# Patient Record
Sex: Female | Born: 1960 | Race: White | Hispanic: No | Marital: Married | Smoking: Current every day smoker
Health system: Southern US, Community
[De-identification: ages and names within clinical notes are randomized; demographics above are authoritative.]

## PROBLEM LIST (undated history)

## (undated) DIAGNOSIS — K439 Ventral hernia without obstruction or gangrene: Secondary | ICD-10-CM

## (undated) DIAGNOSIS — D649 Anemia, unspecified: Secondary | ICD-10-CM

## (undated) DIAGNOSIS — F32A Depression, unspecified: Secondary | ICD-10-CM

## (undated) DIAGNOSIS — R0603 Acute respiratory distress: Secondary | ICD-10-CM

## (undated) DIAGNOSIS — J449 Chronic obstructive pulmonary disease, unspecified: Secondary | ICD-10-CM

## (undated) DIAGNOSIS — J45909 Unspecified asthma, uncomplicated: Secondary | ICD-10-CM

## (undated) DIAGNOSIS — F419 Anxiety disorder, unspecified: Secondary | ICD-10-CM

## (undated) DIAGNOSIS — F329 Major depressive disorder, single episode, unspecified: Secondary | ICD-10-CM

## (undated) HISTORY — DX: Acute respiratory distress: R06.03

## (undated) HISTORY — PX: HERNIA REPAIR: SHX51

## (undated) HISTORY — PX: TUBAL LIGATION: SHX77

## (undated) HISTORY — PX: FRACTURE SURGERY: SHX138

## (undated) HISTORY — PX: COLON SURGERY: SHX602

## (undated) HISTORY — PX: EAR TUBE REMOVAL: SHX1486

---

## 2005-05-26 ENCOUNTER — Emergency Department: Payer: Self-pay | Admitting: Unknown Physician Specialty

## 2007-08-03 ENCOUNTER — Ambulatory Visit: Payer: Self-pay | Admitting: Family Medicine

## 2007-11-10 ENCOUNTER — Inpatient Hospital Stay: Payer: Self-pay | Admitting: Psychiatry

## 2008-03-09 ENCOUNTER — Emergency Department: Payer: Self-pay | Admitting: Emergency Medicine

## 2009-05-23 ENCOUNTER — Ambulatory Visit: Payer: Self-pay | Admitting: Otolaryngology

## 2009-06-02 ENCOUNTER — Ambulatory Visit: Payer: Self-pay | Admitting: Otolaryngology

## 2009-06-13 ENCOUNTER — Emergency Department: Payer: Self-pay | Admitting: Emergency Medicine

## 2009-10-05 ENCOUNTER — Inpatient Hospital Stay (HOSPITAL_COMMUNITY): Admission: EM | Admit: 2009-10-05 | Discharge: 2009-10-12 | Payer: Self-pay | Admitting: Emergency Medicine

## 2009-11-24 ENCOUNTER — Ambulatory Visit: Payer: Self-pay | Admitting: Neurosurgery

## 2010-02-03 ENCOUNTER — Ambulatory Visit: Payer: Self-pay | Admitting: Neurosurgery

## 2010-07-20 LAB — BASIC METABOLIC PANEL
BUN: 6 mg/dL (ref 6–23)
BUN: 7 mg/dL (ref 6–23)
Calcium: 8.1 mg/dL — ABNORMAL LOW (ref 8.4–10.5)
Chloride: 95 mEq/L — ABNORMAL LOW (ref 96–112)
Creatinine, Ser: 0.55 mg/dL (ref 0.4–1.2)
Creatinine, Ser: 0.71 mg/dL (ref 0.4–1.2)
GFR calc non Af Amer: >60 mL/min (ref >60)
Glucose, Bld: 108 mg/dL — ABNORMAL HIGH (ref 70–99)
Glucose, Bld: 72 mg/dL (ref 70–99)
Potassium: 3.7 mEq/L (ref 3.5–5.1)
Sodium: 134 mEq/L — ABNORMAL LOW (ref 135–145)

## 2010-07-20 LAB — CBC
HCT: 28.7 % — ABNORMAL LOW (ref 36.0–46.0)
HCT: 30.7 % — ABNORMAL LOW (ref 36.0–46.0)
HCT: 37.2 % (ref 36.0–46.0)
Hemoglobin: 10.5 g/dL — ABNORMAL LOW (ref 12.0–15.0)
Hemoglobin: 11 g/dL — ABNORMAL LOW (ref 12.0–15.0)
Hemoglobin: 9.9 g/dL — ABNORMAL LOW (ref 12.0–15.0)
MCHC: 35 g/dL (ref 30.0–36.0)
MCHC: 35.3 g/dL (ref 30.0–36.0)
MCV: 114 fL — ABNORMAL HIGH (ref 78.0–100.0)
MCV: 114.3 fL — ABNORMAL HIGH (ref 78.0–100.0)
MCV: 115.2 fL — ABNORMAL HIGH (ref 78.0–100.0)
Platelets: 101 10*3/uL — ABNORMAL LOW (ref 150–400)
Platelets: 52 10*3/uL — ABNORMAL LOW (ref 150–400)
Platelets: 69 10*3/uL — ABNORMAL LOW (ref 150–400)
RBC: 3.27 MIL/uL — ABNORMAL LOW (ref 3.87–5.11)
RDW: 14.4 % (ref 11.5–15.5)
RDW: 14.7 % (ref 11.5–15.5)
RDW: 14.9 % (ref 11.5–15.5)
RDW: 15 % (ref 11.5–15.5)
WBC: 4.6 10*3/uL (ref 4.0–10.5)
WBC: 5.6 10*3/uL (ref 4.0–10.5)
WBC: 5.6 10*3/uL (ref 4.0–10.5)

## 2010-07-20 LAB — PROTIME-INR
Prothrombin Time: 12.9 seconds (ref 11.6–15.2)
Prothrombin Time: 13.1 seconds (ref 11.6–15.2)

## 2010-07-20 LAB — COMPREHENSIVE METABOLIC PANEL
ALT: 163 U/L — ABNORMAL HIGH (ref 0–35)
ALT: 77 U/L — ABNORMAL HIGH (ref 0–35)
AST: 854 U/L — ABNORMAL HIGH (ref 0–37)
Albumin: 3 g/dL — ABNORMAL LOW (ref 3.5–5.2)
Albumin: 3.4 g/dL — ABNORMAL LOW (ref 3.5–5.2)
Alkaline Phosphatase: 99 U/L (ref 39–117)
BUN: 4 mg/dL — ABNORMAL LOW (ref 6–23)
BUN: 6 mg/dL (ref 6–23)
CO2: 29 mEq/L (ref 19–32)
Chloride: 93 mEq/L — ABNORMAL LOW (ref 96–112)
Creatinine, Ser: 0.61 mg/dL (ref 0.4–1.2)
Glucose, Bld: 88 mg/dL (ref 70–99)
Glucose, Bld: 95 mg/dL (ref 70–99)
Potassium: 3.3 mEq/L — ABNORMAL LOW (ref 3.5–5.1)
Sodium: 135 mEq/L (ref 135–145)
Sodium: 137 mEq/L (ref 135–145)
Total Bilirubin: 2.1 mg/dL — ABNORMAL HIGH (ref 0.3–1.2)
Total Protein: 6.1 g/dL (ref 6.0–8.3)
Total Protein: 6.2 g/dL (ref 6.0–8.3)

## 2010-07-20 LAB — ABO/RH: ABO/RH(D): O POS

## 2010-07-20 LAB — MRSA PCR SCREENING: MRSA by PCR: NEGATIVE

## 2010-07-20 LAB — PREPARE PLATELETS

## 2010-07-20 LAB — CK TOTAL AND CKMB (NOT AT ARMC): Relative Index: 2.5 (ref 0.0–2.5)

## 2010-07-20 LAB — TYPE AND SCREEN

## 2011-06-15 IMAGING — CR DG CHEST 1V PORT
1 series · 1 of 1 positions shown · non-contrast
Comparison: CT chest of 10/04/2009

CLINICAL DATA: C2 fracture, rib and sternal fractures

PORTABLE CHEST - 1 VIEW

[AP]
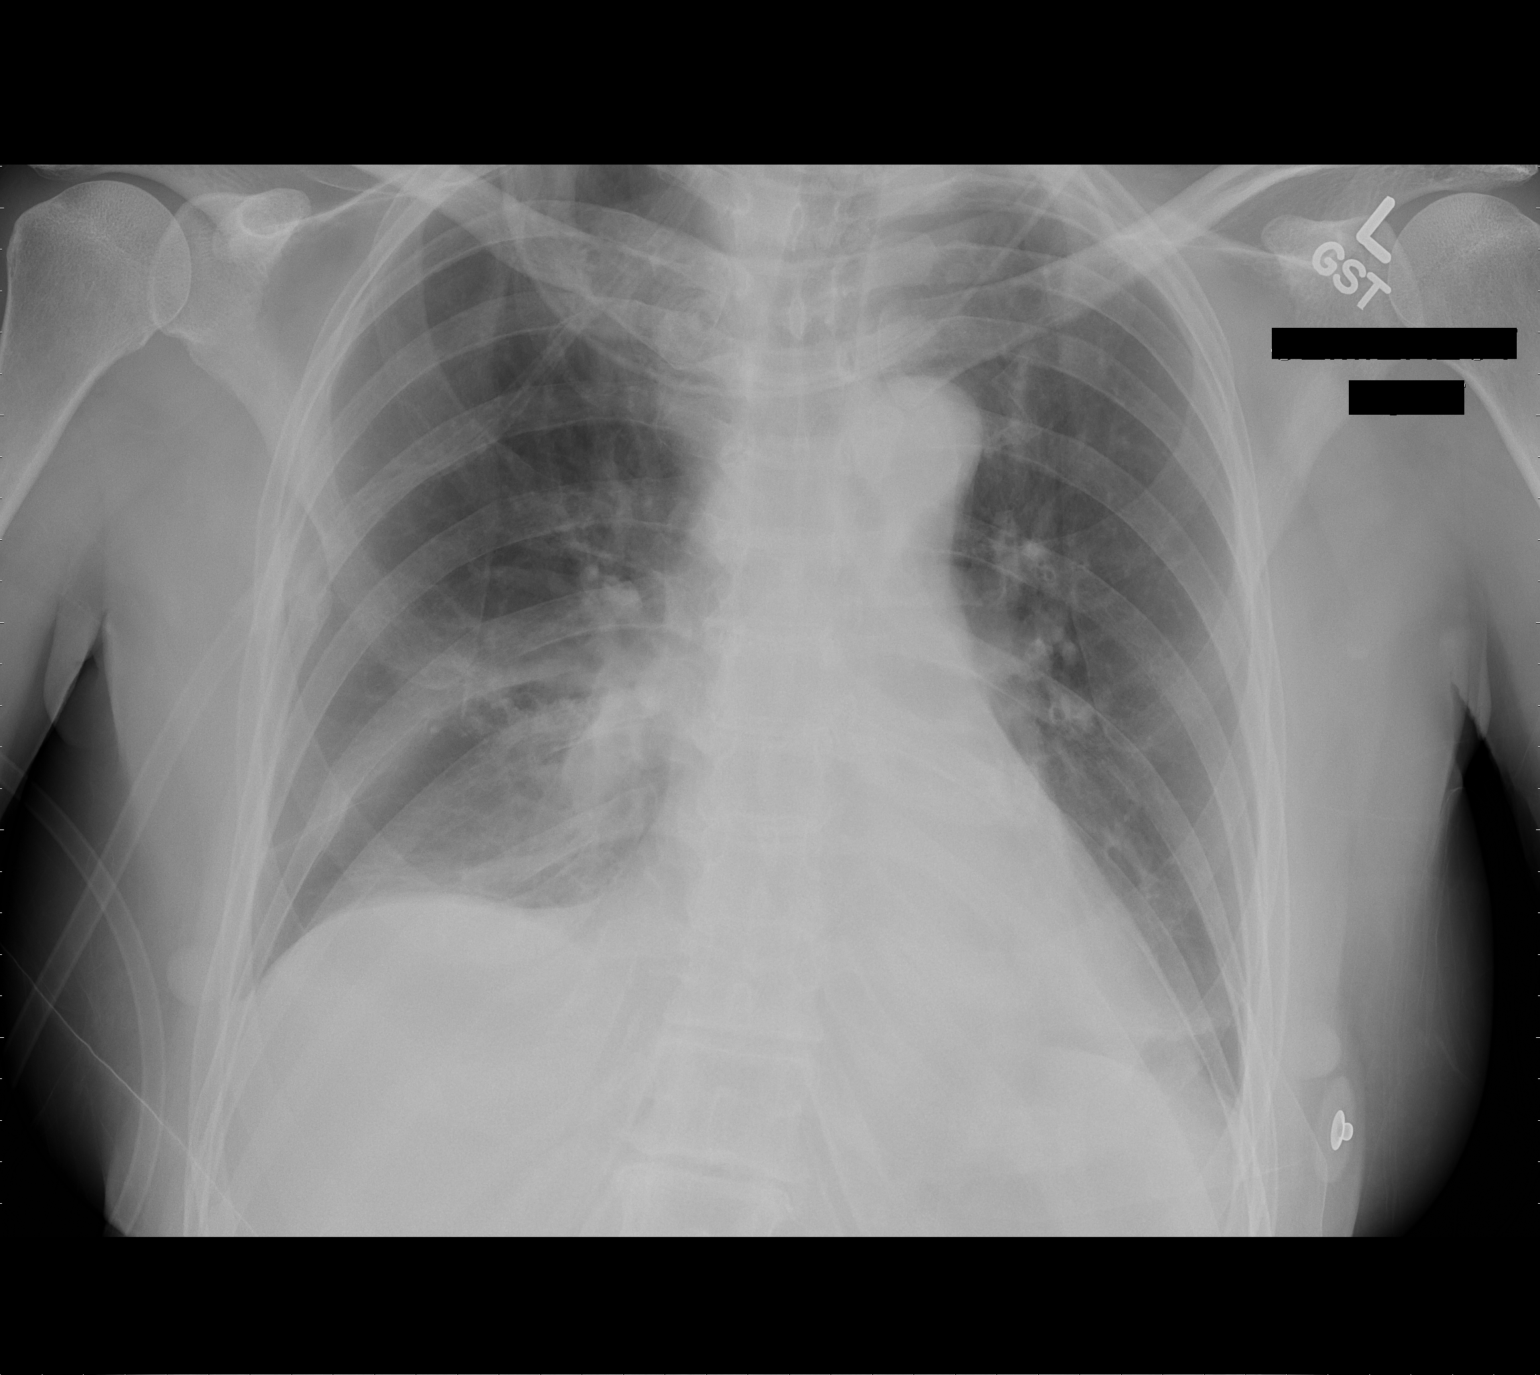

[1 of 1 positions shown; findings below may reference images not displayed]

FINDINGS: There are new opacities at the lung bases compared to the
CT most consistent with contusion and possible atelectasis.  A
small left pneumothorax is difficult to exclude.  Heart size is
stable.  Fracture of the right posterolateral sixth rib is noted.
IMPRESSION: 1.  New opacities at the lung bases most consistent with contusion
and atelectasis.  Pneumonia is difficult to exclude and follow up
is recommended.
2.  Cannot exclude small left pneumothorax.

## 2011-12-30 DIAGNOSIS — G4700 Insomnia, unspecified: Secondary | ICD-10-CM | POA: Insufficient documentation

## 2011-12-30 DIAGNOSIS — F17200 Nicotine dependence, unspecified, uncomplicated: Secondary | ICD-10-CM | POA: Insufficient documentation

## 2011-12-31 ENCOUNTER — Ambulatory Visit: Payer: Self-pay

## 2012-09-25 ENCOUNTER — Inpatient Hospital Stay: Payer: Self-pay | Admitting: Internal Medicine

## 2012-09-25 LAB — CBC
MCV: 85 fL (ref 80–100)
Platelet: 193 10*3/uL (ref 150–440)
RBC: 4.26 10*6/uL (ref 3.80–5.20)
WBC: 10 10*3/uL (ref 3.6–11.0)

## 2012-09-25 LAB — COMPREHENSIVE METABOLIC PANEL
Alkaline Phosphatase: 94 U/L (ref 50–136)
Calcium, Total: 9.5 mg/dL (ref 8.5–10.1)
Chloride: 107 mmol/L (ref 98–107)
Creatinine: 0.57 mg/dL — ABNORMAL LOW (ref 0.60–1.30)
EGFR (African American): >60
Glucose: 127 mg/dL — ABNORMAL HIGH (ref 65–99)
Potassium: 3.7 mmol/L (ref 3.5–5.1)
Sodium: 139 mmol/L (ref 136–145)
Total Protein: 7.8 g/dL (ref 6.4–8.2)

## 2012-09-26 LAB — CBC WITH DIFFERENTIAL/PLATELET
Basophil #: 0 10*3/uL (ref 0.0–0.1)
Eosinophil %: 0 %
HCT: 35.8 % (ref 35.0–47.0)
HGB: 12 g/dL (ref 12.0–16.0)
Lymphocyte #: 1 10*3/uL (ref 1.0–3.6)
MCH: 28.5 pg (ref 26.0–34.0)
Monocyte #: 0.4 x10 3/mm (ref 0.2–0.9)
Monocyte %: 2.6 %
Neutrophil %: 90.4 %
Platelet: 210 10*3/uL (ref 150–440)
RBC: 4.2 10*6/uL (ref 3.80–5.20)

## 2012-09-26 LAB — BASIC METABOLIC PANEL
Anion Gap: 6 — ABNORMAL LOW (ref 7–16)
BUN: 27 mg/dL — ABNORMAL HIGH (ref 7–18)
Calcium, Total: 9.7 mg/dL (ref 8.5–10.1)
Chloride: 103 mmol/L (ref 98–107)
Co2: 26 mmol/L (ref 21–32)
Creatinine: 0.73 mg/dL (ref 0.60–1.30)
EGFR (African American): >60
EGFR (Non-African Amer.): >60
Glucose: 146 mg/dL — ABNORMAL HIGH (ref 65–99)
Sodium: 135 mmol/L — ABNORMAL LOW (ref 136–145)

## 2012-09-27 LAB — CBC WITH DIFFERENTIAL/PLATELET
Basophil #: 0 10*3/uL (ref 0.0–0.1)
Basophil %: 0 %
Eosinophil %: 0 %
Lymphocyte #: 1.1 10*3/uL (ref 1.0–3.6)
Lymphocyte %: 4.2 %
RBC: 4.57 10*6/uL (ref 3.80–5.20)
WBC: 25.6 10*3/uL — ABNORMAL HIGH (ref 3.6–11.0)

## 2012-09-27 LAB — CLOSTRIDIUM DIFFICILE BY PCR

## 2012-09-28 LAB — CBC WITH DIFFERENTIAL/PLATELET
Basophil #: 0 10*3/uL (ref 0.0–0.1)
Eosinophil %: 0 %
HGB: 12 g/dL (ref 12.0–16.0)
Lymphocyte %: 9 %
MCH: 28.8 pg (ref 26.0–34.0)
MCV: 85 fL (ref 80–100)
Monocyte %: 3.2 %
Neutrophil #: 11.7 10*3/uL — ABNORMAL HIGH (ref 1.4–6.5)
Neutrophil %: 87.8 %
Platelet: 220 10*3/uL (ref 150–440)

## 2013-05-28 DIAGNOSIS — K921 Melena: Secondary | ICD-10-CM | POA: Insufficient documentation

## 2013-06-22 DIAGNOSIS — K6389 Other specified diseases of intestine: Secondary | ICD-10-CM | POA: Insufficient documentation

## 2013-10-30 DIAGNOSIS — J449 Chronic obstructive pulmonary disease, unspecified: Secondary | ICD-10-CM | POA: Insufficient documentation

## 2014-03-12 ENCOUNTER — Inpatient Hospital Stay: Payer: Self-pay | Admitting: Internal Medicine

## 2014-03-12 LAB — COMPREHENSIVE METABOLIC PANEL
ANION GAP: 10 (ref 7–16)
AST: 28 U/L (ref 15–37)
Albumin: 3.4 g/dL (ref 3.4–5.0)
Alkaline Phosphatase: 92 U/L
BILIRUBIN TOTAL: 0.2 mg/dL (ref 0.2–1.0)
BUN: 13 mg/dL (ref 7–18)
CALCIUM: 9.1 mg/dL (ref 8.5–10.1)
CO2: 27 mmol/L (ref 21–32)
Chloride: 102 mmol/L (ref 98–107)
Creatinine: 1 mg/dL (ref 0.60–1.30)
EGFR (African American): >60
EGFR (Non-African Amer.): >60
GLUCOSE: 113 mg/dL — AB (ref 65–99)
Osmolality: 278 (ref 275–301)
POTASSIUM: 4.2 mmol/L (ref 3.5–5.1)
SGPT (ALT): 31 U/L
SODIUM: 139 mmol/L (ref 136–145)
TOTAL PROTEIN: 8 g/dL (ref 6.4–8.2)

## 2014-03-12 LAB — CK TOTAL AND CKMB (NOT AT ARMC)
CK, Total: 160 U/L
CK-MB: 1.1 ng/mL (ref 0.5–3.6)

## 2014-03-12 LAB — CBC
HCT: 30.9 % — ABNORMAL LOW (ref 35.0–47.0)
HGB: 9.6 g/dL — AB (ref 12.0–16.0)
MCH: 23.6 pg — AB (ref 26.0–34.0)
MCHC: 31.2 g/dL — ABNORMAL LOW (ref 32.0–36.0)
MCV: 76 fL — ABNORMAL LOW (ref 80–100)
PLATELETS: 208 10*3/uL (ref 150–440)
RBC: 4.09 10*6/uL (ref 3.80–5.20)
RDW: 17.3 % — AB (ref 11.5–14.5)
WBC: 13.2 10*3/uL — ABNORMAL HIGH (ref 3.6–11.0)

## 2014-03-12 LAB — TROPONIN I: Troponin-I: <0.02 ng/mL

## 2014-03-13 LAB — CBC WITH DIFFERENTIAL/PLATELET
BASOS PCT: 0.1 %
Basophil #: 0 10*3/uL (ref 0.0–0.1)
EOS PCT: 0 %
Eosinophil #: 0 10*3/uL (ref 0.0–0.7)
HCT: 28.4 % — AB (ref 35.0–47.0)
HGB: 8.9 g/dL — AB (ref 12.0–16.0)
LYMPHS ABS: 0.9 10*3/uL — AB (ref 1.0–3.6)
LYMPHS PCT: 5.3 %
MCH: 23.6 pg — ABNORMAL LOW (ref 26.0–34.0)
MCHC: 31.2 g/dL — ABNORMAL LOW (ref 32.0–36.0)
MCV: 75 fL — ABNORMAL LOW (ref 80–100)
MONO ABS: 0.3 x10 3/mm (ref 0.2–0.9)
MONOS PCT: 1.6 %
NEUTROS ABS: 15.7 10*3/uL — AB (ref 1.4–6.5)
NEUTROS PCT: 93 %
PLATELETS: 198 10*3/uL (ref 150–440)
RBC: 3.77 10*6/uL — ABNORMAL LOW (ref 3.80–5.20)
RDW: 18.1 % — ABNORMAL HIGH (ref 11.5–14.5)
WBC: 16.8 10*3/uL — ABNORMAL HIGH (ref 3.6–11.0)

## 2014-03-13 LAB — BASIC METABOLIC PANEL
Anion Gap: 9 (ref 7–16)
BUN: 17 mg/dL (ref 7–18)
CHLORIDE: 104 mmol/L (ref 98–107)
CO2: 27 mmol/L (ref 21–32)
Calcium, Total: 9 mg/dL (ref 8.5–10.1)
Creatinine: 1.07 mg/dL (ref 0.60–1.30)
EGFR (African American): >60
GFR CALC NON AF AMER: 57 — AB
Glucose: 252 mg/dL — ABNORMAL HIGH (ref 65–99)
OSMOLALITY: 289 (ref 275–301)
Potassium: 3.6 mmol/L (ref 3.5–5.1)
SODIUM: 140 mmol/L (ref 136–145)

## 2014-03-14 LAB — HEMOGLOBIN A1C: HEMOGLOBIN A1C: 6.7 % — AB (ref 4.2–6.3)

## 2014-03-17 LAB — CULTURE, BLOOD (SINGLE)

## 2014-03-31 ENCOUNTER — Inpatient Hospital Stay: Payer: Self-pay | Admitting: Internal Medicine

## 2014-03-31 LAB — IRON AND TIBC
IRON BIND. CAP.(TOTAL): 327 ug/dL (ref 250–450)
IRON SATURATION: 3 %
IRON: 11 ug/dL — AB (ref 50–170)
UNBOUND IRON-BIND. CAP.: 316 ug/dL

## 2014-03-31 LAB — FERRITIN: Ferritin (ARMC): 16 ng/mL (ref 8–388)

## 2014-03-31 LAB — BASIC METABOLIC PANEL
ANION GAP: 7 (ref 7–16)
BUN: 17 mg/dL (ref 7–18)
CALCIUM: 9 mg/dL (ref 8.5–10.1)
CO2: 28 mmol/L (ref 21–32)
CREATININE: 0.97 mg/dL (ref 0.60–1.30)
Chloride: 104 mmol/L (ref 98–107)
EGFR (Non-African Amer.): >60
GLUCOSE: 169 mg/dL — AB (ref 65–99)
Osmolality: 283 (ref 275–301)
POTASSIUM: 3.3 mmol/L — AB (ref 3.5–5.1)
Sodium: 139 mmol/L (ref 136–145)

## 2014-03-31 LAB — ETHANOL

## 2014-03-31 LAB — CBC
HCT: 19.7 % — AB (ref 35.0–47.0)
HGB: 6.2 g/dL — AB (ref 12.0–16.0)
MCH: 23.3 pg — AB (ref 26.0–34.0)
MCHC: 31.2 g/dL — ABNORMAL LOW (ref 32.0–36.0)
MCV: 75 fL — ABNORMAL LOW (ref 80–100)
Platelet: 242 10*3/uL (ref 150–440)
RBC: 2.64 10*6/uL — AB (ref 3.80–5.20)
RDW: 18.9 % — ABNORMAL HIGH (ref 11.5–14.5)
WBC: 7.9 10*3/uL (ref 3.6–11.0)

## 2014-03-31 LAB — TROPONIN I

## 2014-03-31 LAB — RETICULOCYTES
ABSOLUTE RETIC COUNT: 0.0609 10*6/uL (ref 0.019–0.186)
RETICULOCYTE: 2.61 % (ref 0.4–3.1)

## 2014-03-31 LAB — FOLATE: Folic Acid: 16.4 ng/mL (ref 3.1–100.0)

## 2014-04-01 LAB — CBC WITH DIFFERENTIAL/PLATELET
BASOS PCT: 0.1 %
Basophil #: 0 10*3/uL (ref 0.0–0.1)
Eosinophil #: 0 10*3/uL (ref 0.0–0.7)
Eosinophil %: 0 %
HCT: 27.3 % — ABNORMAL LOW (ref 35.0–47.0)
HGB: 8.7 g/dL — ABNORMAL LOW (ref 12.0–16.0)
LYMPHS ABS: 0.9 10*3/uL — AB (ref 1.0–3.6)
LYMPHS PCT: 8 %
MCH: 24.6 pg — ABNORMAL LOW (ref 26.0–34.0)
MCHC: 31.9 g/dL — ABNORMAL LOW (ref 32.0–36.0)
MCV: 77 fL — ABNORMAL LOW (ref 80–100)
MONOS PCT: 2 %
Monocyte #: 0.2 x10 3/mm (ref 0.2–0.9)
NEUTROS ABS: 10.5 10*3/uL — AB (ref 1.4–6.5)
NEUTROS PCT: 89.9 %
PLATELETS: 248 10*3/uL (ref 150–440)
RBC: 3.54 10*6/uL — AB (ref 3.80–5.20)
RDW: 20.4 % — AB (ref 11.5–14.5)
WBC: 11.7 10*3/uL — AB (ref 3.6–11.0)

## 2014-04-02 LAB — CBC WITH DIFFERENTIAL/PLATELET
BASOS ABS: 0 10*3/uL (ref 0.0–0.1)
Basophil %: 0.1 %
EOS PCT: 0 %
Eosinophil #: 0 10*3/uL (ref 0.0–0.7)
HCT: 26.7 % — ABNORMAL LOW (ref 35.0–47.0)
HGB: 8.3 g/dL — AB (ref 12.0–16.0)
LYMPHS PCT: 7.6 %
Lymphocyte #: 0.9 10*3/uL — ABNORMAL LOW (ref 1.0–3.6)
MCH: 24.3 pg — AB (ref 26.0–34.0)
MCHC: 30.9 g/dL — ABNORMAL LOW (ref 32.0–36.0)
MCV: 79 fL — AB (ref 80–100)
Monocyte #: 0.4 x10 3/mm (ref 0.2–0.9)
Monocyte %: 3.1 %
Neutrophil #: 11 10*3/uL — ABNORMAL HIGH (ref 1.4–6.5)
Neutrophil %: 89.2 %
Platelet: 243 10*3/uL (ref 150–440)
RBC: 3.4 10*6/uL — AB (ref 3.80–5.20)
RDW: 21.4 % — AB (ref 11.5–14.5)
WBC: 12.4 10*3/uL — ABNORMAL HIGH (ref 3.6–11.0)

## 2014-08-23 NOTE — H&P (Signed)
PATIENT NAME:  Julia [name], Julia [name] MR#:  [id number] DATE OF BIRTH:  08/27/1960  DATE OF ADMISSION:  09/25/2012  PRIMARY CARE PHYSICIAN: Dr. Tobin.  CHIEF COMPLAINT:  Shortness of breath, cough.   HISTORY OF PRESENT ILLNESS: This is a 54-year-old female with significant past medical history of asthma, tobacco abuse, who presents with complaints of shortness of breath, productive sputum, white in color, as well as upper respiratory symptoms including sore throat and runny nose. The patient reports she has been having worsening shortness of breath over the last couple of days. She has been using her inhaler without much relief, so she called EMS who gave her DuoNebs x 3 in the ED. The patient was hypoxic in the ED with significant wheezing, where she was reported to be desaturating into the 60s and the 70s when the paramedics prevented. As well, the patient was tachycardic and hypoxic in the ED, where she required to be put on BiPAP with much improvement of her symptoms. She was complaining of fever at home, but she was afebrile here, did not have any leukocytosis. Her chest x-ray did not show any opacity or infiltrate, but it did show hyperinflated lungs typical for COPD, even though the patient denies any history of COPD in the past. The patient reports she has history of asthma, but  never hospitalized and never intubated for asthma.  She reports only 1 visit to the ED before 3 years for shortness of breath, and it was unclear if this one was due to COPD or asthma exacerbation. The patient was given IV Solu-Medrol treatment in the ED and magnesium sulfate, which improved her symptoms, but she still had decreased air entry with significant wheezing, so hospitalist service was requested to admit the patient for further management and treatment of her acute respiratory failure.   PAST MEDICAL HISTORY: 1.  Asthma.  2.  Tobacco abuse.   ALLERGIES: ATROVENT, BANANA AND POLLENS.   HOME MEDICATIONS: 1.   Lorazepam 0.5 mg 2 times a day as needed.  2.  Sertraline.  3.  Advair Diskus 250/50, 1 puff b.i.d.  4.  Proventil as needed.   FAMILY HISTORY: Significant for sarcoidosis in her mother. Father died at the age 49 of unknown causes.   SOCIAL HISTORY: The patient used to smoke 1 pack per day, currently cut back to 1/2 pack per day. Denies any alcohol or illicit drug use.   REVIEW OF SYSTEMS:  Complains of fever, fatigue, weakness. Denies weight gain, weight loss.   EYES: Denies blurry vision, double vision, pain, inflammation.  EARS, NOSE, THROAT: Denies tinnitus, ear pain, hearing loss, epistaxis. Has some sore throat  and nasal discharge.  RESPIRATORY: Denies any hemoptysis. Complains of dyspnea, cough, productive sputum and wheezing.  CARDIOVASCULAR: Denies chest pain, edema, arrhythmia, palpitations or syncope.  GASTROINTESTINAL: Denies nausea, vomiting, diarrhea, abdominal pain, hematemesis, rectal bleed or jaundice.  GENITOURINARY: Denies dysuria, hematuria or renal colic.  ENDOCRINE: Denies polyuria, polydipsia, heat or cold intolerance.  HEMATOLOGY: Denies anemia, easy bruising or bleeding diathesis.  INTEGUMENTARY: Denies acne, rash or skin lesions.  MUSCULOSKELETAL: Denies any swelling, gout, redness or limited activity.  NEUROLOGIC: Denies CVA, TIA, seizures, ataxia or vertigo.  PSYCHIATRIC: Has history of anxiety. Denies any schizophrenia, substance or alcohol abuse.   PHYSICAL EXAMINATION: VITAL SIGNS: Temperature 97.6, pulse 80, respiratory 22, blood pressure 107/61, saturating 98% on BiPAP.  GENERAL: Caucasian female, looks comfortable in bed on BiPAP.  HEENT: Head atraumatic, normocephalic. Pupils equal, reactive to light. Pink   conjunctivae. Anicteric sclerae. Moist oral mucosa. Wearing BiPAP.  NECK: Supple. No thyromegaly. No JVD. Decreased air entry bilaterally with diffuse wheezing. No rales, rhonchi.  CARDIOVASCULAR: S1, S2 heard. No rubs, murmurs or gallops.  ABDOMEN:  Soft, nontender, nondistended. Bowel sounds present.  EXTREMITIES: No edema. No clubbing. No cyanosis. Dorsalis pedis pulse +2 bilaterally.  SKIN: Normal skin turgor. Warm and dry.  PSYCHIATRIC: Appropriate affect. Awake, alert x 3. Intact judgment and insight.  NEUROLOGIC: Cranial nerves grossly intact. Motor is 5/5. No focal deficits on motor or sensory.   PERTINENT LABORATORY DATA:  Glucose 127, BUN 12, creatinine 0.57, sodium 139, potassium 3.7, chloride 107, CO2 of 25. ALT 34, AST 34, alk phos phosphatase 94. Troponin less than 0.02. White blood cells 10, hemoglobin 12.3, hematocrit 36, platelets 193. PCO2 of 48 pH 7.34.   RADIOGRAPHIC DATA:  Chest x-ray showing hyperinflated lungs but no persistent infiltrate. Awaiting official reading by radiology.   ASSESSMENT AND PLAN: 1.  Acute respiratory failure: The patient presents with hypoxic respiratory failure, saturating initially in the 60s or 70s by EMS. Despite multiple nebulizer treatments, the patient is still requiring oxygen. This is most likely related to either chronic obstructive pulmonary disease versus asthma exacerbation.  2.  Chronic obstructive pulmonary disease exacerbation: The patient denies any history of COPD, but has diffuse wheezing, has been smoking for so many years, as well she is on Advair, she was on Singulair, so she will be treated for COPD exacerbation. Currently, she improved on BiPAP. We will have her on BiPAP as needed. We will check sputum culture. We will start her on Levaquin, given the fact she has productive sputum.  We will have her on IV Solu-Medrol and on albuterol as needed. We will resume her on home Advair.  3.  Asthma: Unclear if the patient does have asthma exacerbation at this point. Her symptoms most likely are related to COPD exacerbation, but anyhow she is on Advair for COPD exacerbation and p.r.n. albuterol. SHE HAS ALLERGY TO ATROVENT.  4.  Tobacco abuse: The patient was counseled at length. Does  not seem interested to quit smoking, but currently agreeing to continue with NicoDerm while she is inpatient.  5.  Deep vein thrombosis prophylaxis: SubQ heparin.  6.  Gastrointestinal prophylaxis: On Protonix.   CODE STATUS: Full code.   TOTAL TIME SPENT ON ADMISSION AND PATIENT CARE: 55 minutes.    ____________________________ Dawood [name]. Elgergawy, MD dse:dmm D: 09/25/2012 09:18:31 ET T: 09/25/2012 09:32:56 ET JOB#: [id number]  cc: Dawood [name]. Elgergawy, MD, <Dictator> DAWOOD [name] ELGERGAWY MD ELECTRONICALLY SIGNED 09/27/2012 2:21 

## 2014-08-23 NOTE — Discharge Summary (Signed)
PATIENT NAME:  Julia [name], Julia S MR#:  [id number] DATE OF BIRTH:  08/14/1960  DATE OF ADMISSION:  09/25/2012 DATE OF DISCHARGE:    PRIMARY CARE PHYSICIAN:  Dr. Delton PrairiePaul Tobin.   DISCHARGE DIAGNOSES: 1.  Acute chronic obstructive pulmonary disease/asthma exacerbation.  2.  Acute respiratory failure.  3.  Leukocytosis secondary to steroids.  4.  Vomiting and diarrhea, likely from antibiotic irritation.  5.  Tobacco abuse.   IMAGING STUDIES DONE:  Include a chest x-ray which showed no acute abnormalities.   ADMITTING HISTORY, PHYSICAL AND HOSPITAL COURSE:  Please see detailed H and P dictated by Dr. Randol KernElgergawy on 09/25/2012.  In brief, a 54 year old female patient with prior history of asthma, chronic obstructive pulmonary disease, tobacco abuse, presented to the hospital complaining of productive sputum, shortness of breath.  The patient was wheezing, needing 4 liters of oxygen, was admitted to the hospitalist service for further work-up treatment.   HOSPITAL COURSE:  Acute chronic obstructive pulmonary disease exacerbation with acute respiratory failure.  The patient was started on scheduled nebulizers along with intravenous steroids and antibiotics with which she improved well.  The patient did have leukocytosis secondary to the steroids which is trending down by the time of discharge.  Chest x-ray showed no acute infiltrate.  The patient was counseled for greater than three minutes to quit smoking, which was the probable cause for her exacerbation.  Did not have any fever on the day of discharge.  The patient is saturating 94% on room air.  No wheezing on exam and was discharged home in fair condition.   She did have a brief episode of vomiting and diarrhea which have resolved.  C. difficile was negative.   DISCHARGE MEDICATIONS: 1.  Lorazepam 0.5 mg oral 2 times a day as needed.  2.  Sertraline 100 mg oral once a day.  3.  Proventil 2 puffs inhaled 4 times a day as needed.  4.  Advair Diskus 1  puff inhaled 2 times a day.  5.  Levaquin 500 mg oral once a day for 4 days.  6.  Prednisone 10 mg tablet, 60 mg tapered over 6 days.   DISCHARGE INSTRUCTIONS:  Follow up with primary care physician in a week.  Regular diet.  Activity as tolerated.  Quit smoking.   Time spent on day of discharge in discharge activity was 40 minutes.    ____________________________ Molinda BailiffSrikar R. Felice Deem, MD srs:ea D: 09/29/2012 16:28:04 ET T: 09/30/2012 06:00:50 ET JOB#: [id number]  cc: Wardell HeathSrikar R. Matteson Blue, MD, <Dictator> Orie FishermanSRIKAR R Yoshua Geisinger MD ELECTRONICALLY SIGNED 10/24/2012 10:40

## 2014-08-24 NOTE — H&P (Signed)
PATIENT NAME:  Julia [name], Julia [name] MR#:  [id number] DATE OF BIRTH:  20-Apr-1961  DATE OF ADMISSION:  03/12/2014  PRIMARY CARE PHYSICIAN: Dr. Delton PrairiePaul Tobin.   CHIEF COMPLAINT: Shortness of breath.   HISTORY OF PRESENT ILLNESS: This is a 54 year old female who presents to the hospital complaining of shortness of breath progressively getting worse over the past few days to a week. Her shortness of breath has gotten so worse that she is now short of breath even on minimal exertion or even at rest. She admits to a cough which is productive with clear sputum, admits to some chills but no documented fever. She admits to some nausea but no documented vomiting. She has been waking up in the middle of the night also feeling short of breath and since it has gotten worse she came to the ER for further evaluation. In the Emergency Room the patient was noted to be in acute hypoxic respiratory failure and chest x-ray findings are suggestive of a right lower lobe pneumonia. She was diagnosed with COPD exacerbation likely secondary to pneumonia and hospitalist services were contacted for further treatment and evaluation.   REVIEW OF SYSTEMS:  CONSTITUTIONAL: No documented fever. No weight gain, no weight loss.  EYES: No blurred or double vision.  EARS, NOSE, AND THROAT: No tinnitus. No postnasal drip. No redness of the oropharynx.  RESPIRATORY: Positive cough. Positive wheeze. No hemoptysis. Positive dyspnea. Positive COPD.   CARDIOVASCULAR: No chest pain. No orthopnea. No palpitations. No syncope.  GASTROINTESTINAL: Positive nausea. No vomiting. No diarrhea. No abdominal pain. No melena or hematochezia.  GENITOURINARY: No dysuria or hematuria.  ENDOCRINE: No polyuria, nocturia, heat or cold intolerance.  HEMATOLOGIC: No anemia, no bruising, no bleeding.  INTEGUMENTARY: No rashes, no lesions.  MUSCULOSKELETAL: No arthritis. No swelling. No gout.  NEUROLOGIC: No numbness or tingling. No ataxia. No seizure-type activity.   PSYCHIATRIC: No anxiety. No insomnia. No ADD.   PAST MEDICAL HISTORY: Consistent with asthma, anxiety/depression, history of COPD with ongoing tobacco abuse.   ALLERGIES: ATROVENT, BANANAS, AND POLLEN.   SOCIAL HISTORY: Still smokes about 10 cigarettes per day, has been smoking now for 40 + years. No alcohol abuse. No illicit drug abuse. Lives with her husband.   FAMILY HISTORY:  Father died from complications of alcohol abuse. Mother is alive, she has sarcoidosis.   CURRENT MEDICATIONS:  Xanax 0.5 mg extended release t.i.d. as needed, amitriptyline 25 mg at bedtime, melatonin 5 mg 2 tabs at bedtime, albuterol inhaler 2 puffs 4 times daily as needed, Qvar 40 mcg 2 puffs b.i.d., saline nasal spray 1 spray to each nostril twice daily, Zoloft 50 mg daily, and Tylenol 1000 mg q. 6 hours as needed.   PHYSICAL EXAMINATION:  VITAL SIGNS:  Temperature 98.4, pulse 113, respirations 20, blood pressure 115/85, saturation is 92% on 2 liters nasal cannula.  GENERAL: She is a pleasant-appearing female in mild respiratory distress.  HEAD, EYES, EARS, NOSE, THROAT: Atraumatic, normocephalic. Extraocular muscles are intact. Pupils equal and reactive to light. Sclerae anicteric. No conjunctival injection. No pharyngeal erythema.  NECK: Supple. There is no jugular venous distention. No bruits. No lymphadenopathy. No thyromegaly.  HEART: Regular rate and rhythm, tachycardic. No murmurs, no rubs, no clicks.  LUNGS: She has coarse rhonchi and wheezing diffusely. Positive use of accessory muscles. No dullness to percussion.  ABDOMEN: Soft, flat, nontender, nondistended. Has good bowel sounds. No hepatosplenomegaly appreciated.  EXTREMITIES: No evidence of any cyanosis, clubbing, or peripheral edema. Has + 2 pedal and radial  pulses bilaterally.  NEUROLOGICAL: The patient is alert, awake, and oriented x 3 with no focal motor or sensory deficits appreciated bilaterally.  SKIN: Moist and warm with no rashes  appreciated.  LYMPHATIC: There is no cervical or axillary lymphadenopathy.   LABORATORY DATA: Serum glucose of 113, BUN 13, creatinine 1.0, sodium 139, potassium 4.2, chloride 102, bicarbonate 27. The patient'[name] LFTs are within normal limits. Troponin less than 0.02. White cell count of 13.2, hemoglobin 9.6, hematocrit 30.9, platelet count 208,000. The patient did have a chest x-ray done which showed a right lower lobe pneumonia.   ASSESSMENT AND PLAN: This is a 54 year old female with a history of asthma, anxiety/depression, history of chronic obstructive pulmonary disease with ongoing tobacco abuse, who presents to the hospital due to shortness of breath and noted to be in chronic obstructive pulmonary disease exacerbation due to pneumonia.   1.  Chronic obstructive pulmonary disease exacerbation. This is likely the cause of the patient'[name] acute shortness of breath and hypoxic respiratory failure. The likely cause of the chronic obstructive pulmonary disease exacerbation is underlying right lower lobe pneumonia. I will start the patient on IV steroids, around-the-clock nebulizer treatments, start her on some Advair and Spiriva, give her IV Levaquin for her pneumonia. Follow her clinically. She needs to be assessed for home oxygen prior to discharge.  2.  Pneumonia. This is likely community-acquired pneumonia. I will treat the patient with IV Levaquin, follow blood and sputum cultures.  3.  History of anxiety. Continue with Xanax and Ativan.  4.  Depression. Continue with her Zoloft and Elavil.   CODE STATUS: The patient is a full code.   TIME SPENT ON ADMISSION:  50 minutes.    ____________________________ Rolly Pancake. Cherlynn Kaiser, MD vjs:bu D: 03/12/2014 19:54:28 ET T: 03/12/2014 20:14:00 ET JOB#: [id number]  cc: Rolly Pancake. Cherlynn Kaiser, MD, <Dictator> Houston Siren MD ELECTRONICALLY SIGNED 03/22/2014 12:40

## 2014-08-24 NOTE — Discharge Summary (Signed)
PATIENT NAME:  Julia [name], Brannon S MR#:  [id number] DATE OF BIRTH:  1960-12-02  DATE OF ADMISSION:  03/12/2014 DATE OF DISCHARGE:  03/14/2014  PRESENTING COMPLAINT: Shortness of breath and cough.   DISCHARGE DIAGNOSES:  1.  Acute chronic obstructive pulmonary disease exacerbation.  2.  Pneumonia.  3.  Ongoing tobacco abuse.  4.  Hyperglycemia with borderline type 2 diabetes, hemoglobin A1c is 6.7. Further management per primary care physician as outpatient.   CODE STATUS: Full code.    VITAL SIGNS:  Saturations are 86% with exertion on room air, 92% with 2 liters. The patient will be set up with home oxygen 2 liters. Other vitals at discharge, heart rate is 84, blood pressure 134/79.   FOLLOWUP:  With Dr. Mariana Kaufmanobin, internal medicine primary care physician in 1-2 weeks.   OXYGEN: 2 liters nasal cannula continuous.   DISCHARGE MEDICATIONS:   1. Proventil HFA 2 puffs 4 times a day as needed.   2. Sertraline 50 mg p.o. daily.  3. Tylenol 500 two tablets every 6 hours as needed.  4. Amitriptyline 25 mg at bedtime.  5. Alprazolam 0.5 mg 1 tablet 3 times a day as needed.  6. Saline nasal mist 1 spray each nostril b.i.d.  7. Qvar 2 puffs b.i.d.  8. Melatonin 5 mg 2 tablets at bedtime.  9. Prednisone taper.  10. Levaquin 750 p.o. daily.  11. Advair 250/50 one puff b.i.d.  12. Spiriva 18 mcg inhalation.  13. Albuterol 90 mcg/inhalation 4 times a day as needed.   Follow up with Dr. Mariana Kaufmanobin in 1-2 weeks.   No smoking.   LABORATORY DATA: White count at discharge 16.8, H and H 8.9 and 28.4. Glucose is 252, creatinine is 1.07, sodium is 140.  A1c is 6.7.  Chest x-ray shows right lower lobe pneumonia. Blood cultures negative in 18-24 hours.   BRIEF SUMMARY OF HOSPITAL COURSE: Miss [name] is a pleasant 54 year old Caucasian female with history of asthma, anxiety, depression, and COPD with ongoing tobacco abuse, comes in with shortness of breath and was admitted with:   1.  Acute on chronic COPD  exacerbation most likely due to the patient having pneumonia as well. She was started on IV steroids, inhalers, nebulizers and was assessed for home oxygen for which she has qualified and is arranged to go home with 2 liters nasal cannula oxygen.  2.  Pneumonia right lower lobe.  Will finish her 7 day course of Levaquin. Blood cultures negative.  3.  History of anxiety. Continue Xanax and Ativan.  4.  Depression. Continue Zoloft and Elavil.   5.  Smoking cessation was discussed with the patient. She will follow up with her primary care physician as outpatient.   TIME SPENT: 40 minutes.     ____________________________ Wylie HailSona A. Allena KatzPatel, MD sap:bu D: 03/14/2014 13:51:51 ET T: 03/14/2014 16:45:03 ET JOB#: [id number]  cc: Careen Mauch A. Allena KatzPatel, MD, <Dictator> Vic RipperPaul C. Mariana Kaufmanobin, MD Willow OraSONA A Lauralie Blacksher MD ELECTRONICALLY SIGNED 04/04/2014 14:03

## 2014-08-24 NOTE — Consult Note (Signed)
Brief Consult Note: Diagnosis: melena, anemia.   Patient was seen by consultant.   Comments: Julia [name] is a pleasant 54 y/o caucasian female with couple days of melena with anemia.  She is hemoccult positive.  Suspect UGI bleeding source.  Cannot r/o small bowel AVMs.  [facility] but hx inconsistent with dates as to when this was done (pt tells me 2011, but told previous MD within past 4 months) so we have requested records.  Pt had liquid breakfast at 8am.  EGD with Dr Servando SnareWohl to look for source & possibly control bleeding if needed.  Discussed risks/benefits of procedure which include but are not limited to bleeding, infection, perforation & drug reaction.  Patient agrees with this plan & consent will be obtained.   Plan: 1) NPO 2) EGD this afternoon 3) agree with PPI Thanks for allowing us to participate in her care.  Please see full dictated note. #[id number].  Electronic Signatures: Joselyn ArrowJones, Mykala Mccready L (NP)  (Signed [phone]-Nov-15 14:28)  Authored: Brief Consult Note   Last Updated: 30-Nov-15 14:28 by Joselyn ArrowJones, Kanon Novosel L (NP)

## 2014-08-24 NOTE — H&P (Signed)
PATIENT NAME:  Julia [name], Julia [name] MR#:  [id number] DATE OF BIRTH:  [birth year]  DATE OF ADMISSION:  03/31/2014  REFERRING PHYSICIAN:  Dell Sink. Dolores Frame, M.D.   PRIMARY CARE PHYSICIAN: Vic Ripper. Mariana Kaufman, M.D.   ADMITTING PHYSICIAN: Crissie Figures, M.D.   CHIEF COMPLAINT: Shortness of breath, cough with productive sputum for 2 days.   HISTORY OF PRESENT ILLNESS: A 54 year old, Caucasian female, with a past medical history significant for COPD, history of continuous tobacco use and anxiety/depression, who presents to the Emergency Room with the complaints of shortness of breath associated with cough with productive sputum for the past 2 days. The patient was recently admitted to [facility] on November 10 to November 12 for COPD exacerbation, and following treatment, she was discharged to home. She continues to smoke, and so now again she presents complaining of shortness of breath, cough and productive sputum. No fever, no chest pain, no nausea, no vomiting or diarrhea. In the Emergency Room, the patient was evaluated by the ED physician and was found to have respiratory distress with bilateral rhonchi and was found to be with COPD exacerbation, and work-up revealed acute anemia with acute drop in hemoglobin and hematocrit, with hemoglobin of 6.2 and hematocrit of 19.7, which was kind of low compared to her recent admission 2 weeks ago. Hence, the hospitalist service was consulted for further evaluation and management of COPD exacerbation and acute anemia secondary to GI blood loss requiring PRBC transfusion. On questioning, the patient admits she has been passing dark brown to black stool on and off for the past 2 to 3 weeks. She does complain of some intermittent abdominal discomfort/epigastric pain on and off. She also mentioned that she had a GI work-up with EGD and colonoscopy about 3 to 4 months back at [facility], and the details of the work-up are not known to the patient at this time. The patient denies any nausea or  vomiting, and she also denies any acute abdominal pain.    PAST MEDICAL HISTORY: 1. Asthma/COPD with frequent exacerbations.  2. Tobacco usage which is continuous.  3. Anxiety/depression.   PAST SURGICAL HISTORY: Some abdominal surgery, details of which are not known to the patient at this time.   ALLERGIES: ATROVENT, BANANAS AND POLLEN.  HOME MEDICATIONS: Xanax 0.5 mg extended-release t.i.d. as needed, amitriptyline 25 mg at bedtime, melatonin 5 mg 2 tablets at bedtime, albuterol inhaler 2 puffs 4 times a day as needed, Qvar 40 mcg 2 puffs b.i.d., saline nasal spray 1 spray each nostril twice a day, Zoloft 50 mg 1 tablet a day, Tylenol 1000 mg q.6 h. as needed.   FAMILY HISTORY: Father died from complications of alcohol use. Mother is alive and she has sarcoidosis.    SOCIAL HISTORY: She is married and lives with her husband. Still smokes about 10 cigarettes per day. History of alcohol usage in the past, but denies any alcohol usage at this time. Denies any substance abuse.    REVIEW OF SYSTEMS:  CONSTITUTIONAL: Negative for fever, fatigue, generalized weakness. No excessive weight gain or weight loss.  EYES: Negative for blurred vision or double vision. No pain. No redness. No inflammation.  ENT: Negative for tinnitus, ear pain, hearing loss, epistaxis, or nasal discharge.  RESPIRATORY: Positive for cough with productive sputum, positive for shortness of breath with wheezing and history of COPD.  CARDIOVASCULAR: Negative for chest pain. Negative for palpitations. No syncope or dizziness. She does have shortness of breath secondary to cough and wheezing.  GASTROINTESTINAL: Negative  for nausea, vomiting, diarrhea. She does have this mild abdominal nonspecific pain. She does admit passing dark stools on and off for the past 2 to 3 weeks. No active bleeding.   GENITOURINARY: Negative for dysuria, hematuria, frequency, or urgency.  ENDOCRINE: Negative for polyuria. No heat or cold intolerance.   HEME: No easy bruising.  INTEGUMENTARY: No acne, no skin rash.  MUSCULOSKELETAL: Negative for arthritis, joint pain or swelling.  NEUROLOGICAL: Negative for focal weakness or numbness. No history of CVA, TIA, or seizure disorder.  PSYCHIATRIC: Positive for anxiety/depression disorder for which she takes medications and is under control, per the patient with medications.   PHYSICAL EXAMINATION:  VITAL SIGNS: Temperature 98.7, pulse rate 95, blood pressure on admission 99/55, oxygen saturation is 92% on room air.  GENERAL: Well-developed, well-nourished, obese lady, alert and oriented, not in acute distress.  HEAD: Atraumatic, normocephalic.  EYES: Pupils are equal, reactive to light, conjunctival pallor present. No scleral icterus. Extraocular ocular movements intact.  NOSE: No nasal lesions. No drainage.  EARS: No drainage. No external lesions.  ORAL CAVITY: No mucosal lesions. No exudates.  NECK: Supple. No JVD. No thyromegaly. No carotid bruit. Range of motion of neck normal.  RESPIRATORY: Good respiratory effort. Bilateral rhonchi present. Not using accessory muscles of respiration.  CARDIOVASCULAR: S1, S2 regular. Tachycardia present. No murmurs. Pulses equal at carotid, femoral and pedal pulses. No peripheral edema.   GASTROINTESTINAL: Abdomen is soft, mild epigastric tenderness present. No guarding. No rigidity present. No hepatosplenomegaly. Bowel sounds present and equal in all 4 quadrants.  GENITOURINARY: Heme positive as per the ER physician.  MUSCULOSKELETAL: Gait not tested. Range of motion of all joints normal.  SKIN: Inspection within normal limits.  LYMPH: Negative for cervical lymph nodes.  VASCULAR: Good dorsalis pedis and posterior tibial pulses.  NEUROLOGICAL: Cranial nerves II through XII are grossly intact. DTRs 2+ bilaterally and symmetrical in both upper and lower extremities. Motor strength is 5/5 in both upper and lower extremities.  PSYCHIATRIC: Judgment and  insight are adequate. Alert and oriented x 3. Memory and mood within normal limits.   LABORATORY DATA:  BUN 17, creatinine 0.97, sodium 139, potassium 3.3, chloride 104, bicarbonate 28, calcium 9.0, ethanol less than 3, troponin less than 0.02. WBC 7.9, hemoglobin 6.2, hematocrit 19.7, platelet count 242,000.   X-ray of chest: Vascular congestion noted. Increased interstitial markings with concern for mild interstitial edema.   EKG: Normal sinus rhythm, low voltage QRS.   ASSESSMENT AND PLAN: A 54 year old, Caucasian female, with a past medical history of chronic obstructive pulmonary disease, active smoker, history of anxiety and depression, who presents with the complaints of ongoing shortness of breath with cough with expectoration of 2 day's duration, found to have low hemoglobin and hematocrit with anemia secondary due to acute to sub-acute gastrointestinal bleed.   1. Chronic obstructive pulmonary disease exacerbation secondary to medical complaints and ongoing smoking, and possible acute bronchitis, rule out pneumonia. Plan: Admit to medical floor. Albuterol nebulizers q.4 h., IV Solu-Medrol, oxygen supplementation. Continue Qvar.  2. Anemia secondary due to acute gastrointestinal blood loss, requiring packed red blood cell transfusions. Rule out peptic ulcer disease, rule out colonic pathology. Needs gastrointestinal work-up. Plan: Packed red blood cell transfusions, IV Protonix, and gastrointestinal consultation requested.  3. Active smoker. Plan: Counseled to quit smoking. Offered nicotine replacement treatment. The patient is not decided at this time.  4. History of anxiety/depression, stable on home medications. Continue home medications.  5. Deep vein thrombosis prophylaxis, sequential compression  devices. No heparin or Lovenox because of active gastrointestinal bleed.  6. Gastrointestinal prophylaxis, Protonix.   CODE STATUS: Full Code.   TIME SPENT: 55 minutes.      ____________________________ Crissie FiguresEdavally N. Neya Creegan, MD enr:JT D: 03/31/2014 07:54:27 ET T: 03/31/2014 08:43:24 ET JOB#: [id number]  cc: Crissie FiguresEdavally N. Fianna Snowball, MD, <Dictator> Vic RipperPaul C. Mariana Kaufmanobin, MD Crissie FiguresEDAVALLY N Myna Freimark MD ELECTRONICALLY SIGNED 03/31/2014 21:04

## 2014-08-24 NOTE — Discharge Summary (Signed)
Dates of Admission and Diagnosis:  Date of Admission 31-Mar-2014   Date of Discharge 02-Apr-2014   Admitting Diagnosis Chronic obstructive Pulmonary disease exacerbation   Final Diagnosis Chronic obstructive Pulmonary disease exacerbation Active smoking Acute blood loss anemia- gastro inetestinal origin- need capsule endoscopy. Anxiety/ depression    Chief Complaint/History of Present Illness A 54 year old, Caucasian female, with a past medical history significant for COPD, history of continuous tobacco use and anxiety/depression, who presents to the Emergency Room with the complaints of shortness of breath associated with cough with productive sputum for the past 2 days. The patient was recently admitted to [facility] on November 10 to November 12 for COPD exacerbation, and following treatment, she was discharged to home. She continues to smoke, and so now again she presents complaining of shortness of breath, cough and productive sputum. No fever, no chest pain, no nausea, no vomiting or diarrhea. In the Emergency Room, the patient was evaluated by the ED physician and was found to have respiratory distress with bilateral rhonchi and was found to be with COPD exacerbation, and work-up revealed acute anemia with acute drop in hemoglobin and hematocrit, with hemoglobin of 6.2 and hematocrit of 19.7, which was kind of low compared to her recent admission 2 weeks ago. Hence, the hospitalist service was consulted for further evaluation and management of COPD exacerbation and acute anemia secondary to GI blood loss requiring PRBC transfusion. On questioning, the patient admits she has been passing dark brown to black stool on and off for the past 2 to 3 weeks. She does complain of some intermittent abdominal discomfort/epigastric pain on and off. She also mentioned that she had a GI work-up with EGD and colonoscopy about 3 to 4 months back [facility], and the details of the work-up are not known to the patient at  this time. The patient denies any nausea or vomiting, and she also denies any acute abdominal pain.   Allergies:  Atrovent: Other  Bananas: Other  Pollen: Other  Pertinent Past History:  Pertinent Past History 1. Asthma/COPD with frequent exacerbations.  2. Tobacco usage which is continuous.  3. Anxiety/depression.   Hospital Course:  Hospital Course A 54 year old, Caucasian female, with a past medical history of chronic obstructive pulmonary disease, active smoker, history of anxiety and depression, who presents with the complaints of ongoing shortness of breath with cough with expectoration of 2 day's duration, found to have low hemoglobin and hematocrit with anemia secondary due to acute to sub-acute gastrointestinal bleed.   * Chronic obstructive pulmonary disease exacerbation secondary to medical complaints and ongoing smoking, and possible acute bronchitis     Albuterol nebulizers q.4 h., IV Solu-Medrol, oxygen supplementation. Continue Qvar. improved now. * Anemia secondary due to acute gastrointestinal bleed .s/p Packed red blood cell transfusions, IV Protonix, and gastrointestinal consultation appreciated, EGD done- no acute findings.   Hb stable- no more bleed- need capsule endoscopy as out pt- [facility] she [facility] in past by help fo PMD.  * Active smoker.  Counseled for 4 min to quit smoking. Offered nicotine replacement treatment. she will consider not to smoke now. * History of anxiety/depression, stable on home medications. Continue home medications.  * Deep vein thrombosis prophylaxis, sequential compression devices. No heparin or Lovenox because of active gastrointestinal bleed. * Gastrointestinal prophylaxis, Protonix.   Condition on Discharge Stable   Code Status:  Code Status Full Code   DISCHARGE INSTRUCTIONS HOME MEDS:  Medication Reconciliation: Patient's Home Medications  at Discharge:     Medication Instructions  proventil hfa  cfc free 90 mcg/inh inhalation aerosol  2 puff(s) inhaled 4 times a day, As Needed - for Shortness of Breath, for Wheezing    sertraline 50 mg oral tablet  1 tab(s) orally once a day   amitriptyline 25 mg oral tablet  1 tab(s) orally once a day (at bedtime)   alprazolam 0.5 mg oral tablet, extended release  1 tab(s) orally 3 times a day, As Needed - for Anxiety -for Inability to Sleep   saline nasal mist  1 spray(s) into each nostril 2 times a day, As Needed for congestion.   qvar with dose counter 40 mcg/inh inhalation aerosol  2 puff(s) inhaled 2 times a day   melatonin 5 mg oral tablet  2 tab(s) orally once a day (at bedtime)   fluticasone-salmeterol 250 mcg-50 mcg inhalation powder  1 puff(s) inhaled 2 times a day   tiotropium 18 mcg inhalation capsule  1 cap(s) inhaled once a day   albuterol cfc free 90 mcg/inh inhalation aerosol   inhaled 4 times a day as needed   pantoprazole 40 mg oral delayed release tablet  1 tab(s) orally once a day   ferrous sulfate 325 mg oral tablet  1 tab(s) orally 3 times a day     Physician's Instructions:  Diet Low Sodium  Low Fat, Low Cholesterol  Carbohydrate Controlled (ADA) Diet   Activity Limitations As tolerated   Return to Work Not Applicable   Time frame for Follow Up Appointment 1-2 weeks  Gi clinic   Time frame for Follow Up Appointment 1-2 weeks  PMD   Other Comments check hemoglobin with PMD in 2 weeks.     Delton Prairie C(Family Physician): [facility], [address], [place], [place] [address], [phone]  TIME SPENT:  Total Time: Greater than 30 minutes   Electronic Signatures: Altamese Dilling (MD)  (Signed 04-Dec-15 21:58)  Authored: ADMISSION DATE AND DIAGNOSIS, CHIEF COMPLAINT/HPI, Allergies, PERTINENT PAST HISTORY, HOSPITAL COURSE, DISCHARGE INSTRUCTIONS HOME MEDS, PATIENT INSTRUCTIONS, Follow Up Physician, TIME SPENT   Last Updated: 04-Dec-15 21:58 by Altamese Dilling (MD)

## 2014-08-24 NOTE — Consult Note (Signed)
PATIENT NAME:  Julia [name], Julia [name] MR#:  [id number] DATE OF BIRTH:  [birth year]  DATE OF CONSULTATION:  04/01/2014  REFERRING PHYSICIAN:  Jonnie Kind, MD CONSULTING PHYSICIAN:  Midge Minium, MD / Joselyn Arrow, NP  PRIMARY CARE PHYSICIAN: Delton Prairie, MD  REASON FOR CONSULTATION: Melena, anemia.  HISTORY OF PRESENT ILLNESS: [name] is a 54 year old Caucasian female who has history of COPD and anxiety and depression who presented to the Emergency Department with shortness of breath and cough. She was admitted to [facility] November 10th to 12th for COPD exacerbation. At that time, she was discharged with a hemoglobin of 8.9. She returned with a hemoglobin of 6.2 and has been given 2 units of packed RBCs and hemoglobin is up to 8.7. She states that she has had dark black stools over the last couple of days. She has heartburn all the time. She denies any problems with anorexia. Denies any nausea or vomiting. Denies any chest pain or fever or chills. Denies any diarrhea or constipation. She denies any history of anemia. She does however know that she had a  GI work-up previously at [facility]. In other records it is noted that this was done within the past 4 months; however, when I questioned her during my consult she says that her work-up was back in 2011 when she stopped drinking alcohol. She tells me she had an EGD and a colonoscopy and a capsule study that showed GI bleeding, but she cannot remember exact details of the exam. We have requested records from [facility]. She does not believe that she has ever had any blood transfusions. She gives history of "ruptured intestines" with repair many years ago, but cannot give me any other details. She is not on PPI for acid reflux at home. She denies any NSAIDs. She had a ferritin of 16, TIBC 327, UIBC 316, iron saturation 3% and iron of 11.   PAST MEDICAL AND SURGICAL HISTORY: COPD, tobacco, anxiety, depression, abdominal surgery, questionable  rupture and repair, hysterectomy, history of alcohol abuse.   MEDICATIONS PRIOR TO ADMISSION: Albuterol 90 mcg inhaler q.i.d. p.r.n., alprazolam 0.5 mg extended release up to t.i.d. p.r.n., amitriptyline 25 mg at bedtime, fluticasone/salmeterol 250/50 mcg b.i.d., Levaquin 750 mg daily, melatonin 5 mg 2 tablets at bedtime, prednisone 10 mg taper, Proventil HFA CFC free 90 mcg 2 puffs q.i.d. p.r.n., Q-Var 40 mcg 2 puffs b.i.d., saline mist 1 spray each nostril b.i.d., sertraline 50 mcg daily, tiotropium 18 mcg inhalation daily, Tylenol 500 mg 2 tablets q. 6 hours p.r.n. pain.   ALLERGIES: ATROVENT CAUSES BRONCHOSPASM, POLLEN CAUSES HAYFEVER AND BANANAS CAUSE NAUSEA AND VOMITING.   FAMILY HISTORY: Mom had sarcoidosis. Father was an alcoholic. No known family history of colorectal carcinoma, liver or chronic GI problems.   SOCIAL HISTORY: She is married. She resides with her husband. She smokes about 3/4 pack of cigarettes a day. History of alcohol use in the past, but denies any current use. Denies any illicit drug use.   REVIEW OF SYSTEMS:  CONSTITUTIONAL: She has had some weakness and fatigue.  PULMONARY: She has had a chronic productive cough with clear sputum, shortness of breath on exertion, history of COPD. PSYCHIATRIC: She has a history of anxiety and depression.  Otherwise, negative 12 point review of systems.  PHYSICAL EXAMINATION: VITAL SIGNS: Temperature 97.7, pulse 77, respirations 18, blood pressure 107/57, O2 sat 92% on 2 liters via nasal cannula. Her BMI is 26. Her height is 65 inches. Her weight is  156.3.  GENERAL: She is a well-developed, well-nourished Caucasian female in no acute distress.  HEENT: Sclerae clear, anicteric. Conjunctivae pink. Oropharynx pink and moist without any lesions.  NECK: Supple without mass or thyromegaly.  CHEST: Heart regular rhythm. Normal S1, S2. No murmurs, clicks, rubs, or gallops.  LUNGS: Bilateral mild expiratory wheezes, chronic emphysematous  changes. Normal effort. No accessory muscles.  ABDOMEN: Positive bowel sounds x4. No bruits auscultated. Abdomen is soft, nontender, nondistended without palpable mass or hepatosplenomegaly. No rebound, tenderness or guarding.  EXTREMITIES: Without clubbing or edema.  SKIN: Pink, warm and dry without any rash or jaundice.  NEUROLOGIC: Grossly intact.  PSYCHIATRIC: Alert, cooperative. Normal mood and affect.  MUSCULOSKELETAL: Good equal movement and strength bilaterally.   LABORATORY STUDIES: Glucose 169, potassium 3.3, otherwise normal basic metabolic panel. Ethanol less than 3. Troponin negative. White blood cell count 11.7, platelets 248,000.   IMPRESSION: [name] is a pleasant 54 year old Caucasian female with couple of days of melena with anemia. She is Hemoccult positive. Suspect upper gastrointestinal bleeding source. Cannot rule out small bowel arteriovenous malformations. [facility] but her history is inconsistent with date as to when this was done. The patient tells me it was 2011, but told her previous doctor it was done within the past 4 months so we have requested records. The patient had a liquid breakfast at 8:00 a.m. She will have an EGD today with Dr. Servando SnareWohl to look for source of possible bleeding and possibly control the bleeding with intervention if needed. I have discussed risks and benefits which include but are not limited to bleeding, infection, perforation, and drug reaction. She agrees with the plan. Consent will be obtained.   PLAN: 1.  N.p.o.  2.  EGD this afternoon.  3.  Agree with PPI.   Thank you for allowing us to participate in her care.   These services were provided by Joselyn ArrowKandice L. Anniah Glick, NP under collaborative agreement with Dr. Midge Miniumarren Wohl.   ____________________________ Joselyn ArrowKandice L. Maryagnes Carrasco, NP klj:sb D: 04/01/2014 14:28:37 ET T: 04/01/2014 14:44:29 ET JOB#: [id number]  cc: Joselyn ArrowKandice L. Linzy Laury, NP, <Dictator> Joselyn ArrowKANDICE L Jaun Galluzzo  FNP ELECTRONICALLY SIGNED 04/12/2014 23:50

## 2014-08-24 NOTE — Consult Note (Signed)
Chief Complaint:  Subjective/Chief Complaint Pt denies any melena or rectal bleeding.  Denies nausea or vomiting.  c/o severe heartburn after eating salsa & melon for lunch.  She had sausage for breakfast.  She has not had protonix yet today.   VITAL SIGNS/ANCILLARY NOTES: **Vital Signs.:   01-Dec-15 11:35  Vital Signs Type Routine  Temperature Temperature (F) 97.6  Celsius 36.4  Temperature Source oral  Pulse Pulse 63  Respirations Respirations 18  Systolic BP Systolic BP 103  Diastolic BP (mmHg) Diastolic BP (mmHg) 78  Mean BP 86  Pulse Ox % Pulse Ox % 97  Pulse Ox Activity Level  At rest  Oxygen Delivery 2L   Brief Assessment:  GEN well developed, well nourished, no acute distress, A/Ox3   Cardiac Regular   Respiratory normal resp effort   Gastrointestinal Normal   Gastrointestinal details normal Soft  Nontender  Nondistended  No masses palpable  Bowel sounds normal   EXTR negative cyanosis/clubbing, negative edema   Additional Physical Exam Skin: pink, warm, dry   Lab Results: Routine Hem:  01-Dec-15 06:47   WBC (CBC)  12.4  RBC (CBC)  3.40  Hemoglobin (CBC)  8.3  Hematocrit (CBC)  26.7  Platelet Count (CBC) 243  MCV  79  MCH  24.3  MCHC  30.9  RDW  21.4  Neutrophil % 89.2  Lymphocyte % 7.6  Monocyte % 3.1  Eosinophil % 0.0  Basophil % 0.1  Neutrophil #  11.0  Lymphocyte #  0.9  Monocyte # 0.4  Eosinophil # 0.0  Basophil # 0.0 (Result(s) reported on 02 Apr 2014 at 07:08AM.)   Assessment/Plan:  Assessment/Plan:  Assessment Melena: EGD benign.  Likely small bowel source ?AVMs. Anemia:  Acute on chronic suspect secondary to intermittent GI bleed.  Hgb stable s/p 2 units PRBCs I have discussed her care with [facility] & our plan of care is below.  Discussed with Dr Madelon LipsVacchani.   Plan 1) RN will give pt protonix 40mg  now 2) Will need outpatient small bowel evaluation with capsule study 3) Follow Hgb to ensure stability Please call if you have any  questions or concerns   Electronic Signatures: Joselyn ArrowJones, Megon Kalina L (NP)  (Signed 01-Dec-15 13:08)  Authored: Chief Complaint, VITAL SIGNS/ANCILLARY NOTES, Brief Assessment, Lab Results, Assessment/Plan   Last Updated: 01-Dec-15 13:08 by Joselyn ArrowJones, Avis Mcmahill L (NP)

## 2015-04-07 ENCOUNTER — Inpatient Hospital Stay
Admission: EM | Admit: 2015-04-07 | Discharge: 2015-04-09 | DRG: 190 | Disposition: A | Discharge status: Home / Self Care | Payer: Medicaid Other | Attending: Internal Medicine | Admitting: Internal Medicine

## 2015-04-07 ENCOUNTER — Encounter: Payer: Self-pay | Admitting: Emergency Medicine

## 2015-04-07 ENCOUNTER — Emergency Department: Payer: Medicaid Other

## 2015-04-07 DIAGNOSIS — R0602 Shortness of breath: Secondary | ICD-10-CM | POA: Diagnosis present

## 2015-04-07 DIAGNOSIS — J45909 Unspecified asthma, uncomplicated: Secondary | ICD-10-CM | POA: Diagnosis present

## 2015-04-07 DIAGNOSIS — J441 Chronic obstructive pulmonary disease with (acute) exacerbation: Principal | ICD-10-CM | POA: Diagnosis present

## 2015-04-07 DIAGNOSIS — Z888 Allergy status to other drugs, medicaments and biological substances status: Secondary | ICD-10-CM | POA: Diagnosis not present

## 2015-04-07 DIAGNOSIS — F17210 Nicotine dependence, cigarettes, uncomplicated: Secondary | ICD-10-CM | POA: Diagnosis present

## 2015-04-07 DIAGNOSIS — F419 Anxiety disorder, unspecified: Secondary | ICD-10-CM | POA: Diagnosis present

## 2015-04-07 DIAGNOSIS — Z79899 Other long term (current) drug therapy: Secondary | ICD-10-CM | POA: Diagnosis not present

## 2015-04-07 DIAGNOSIS — J9601 Acute respiratory failure with hypoxia: Secondary | ICD-10-CM | POA: Diagnosis present

## 2015-04-07 DIAGNOSIS — D638 Anemia in other chronic diseases classified elsewhere: Secondary | ICD-10-CM | POA: Diagnosis present

## 2015-04-07 DIAGNOSIS — J40 Bronchitis, not specified as acute or chronic: Secondary | ICD-10-CM

## 2015-04-07 DIAGNOSIS — J449 Chronic obstructive pulmonary disease, unspecified: Secondary | ICD-10-CM | POA: Diagnosis present

## 2015-04-07 HISTORY — DX: Chronic obstructive pulmonary disease, unspecified: J44.9

## 2015-04-07 HISTORY — DX: Unspecified asthma, uncomplicated: J45.909

## 2015-04-07 LAB — COMPREHENSIVE METABOLIC PANEL
ALK PHOS: 71 U/L (ref 38–126)
ALT: 13 U/L — ABNORMAL LOW (ref 14–54)
ANION GAP: 6 (ref 5–15)
AST: 18 U/L (ref 15–41)
Albumin: 3 g/dL — ABNORMAL LOW (ref 3.5–5.0)
BILIRUBIN TOTAL: 0.3 mg/dL (ref 0.3–1.2)
BUN: 16 mg/dL (ref 6–20)
CHLORIDE: 103 mmol/L (ref 101–111)
CO2: 29 mmol/L (ref 22–32)
CREATININE: 0.86 mg/dL (ref 0.44–1.00)
Calcium: 9.2 mg/dL (ref 8.9–10.3)
GLUCOSE: 107 mg/dL — AB (ref 65–99)
Potassium: 4.3 mmol/L (ref 3.5–5.1)
Sodium: 138 mmol/L (ref 135–145)
Total Protein: 7.5 g/dL (ref 6.5–8.1)

## 2015-04-07 LAB — CBC
HEMATOCRIT: 25.7 % — AB (ref 35.0–47.0)
HEMOGLOBIN: 8.6 g/dL — AB (ref 12.0–16.0)
MCH: 26.2 pg (ref 26.0–34.0)
MCHC: 33.5 g/dL (ref 32.0–36.0)
MCV: 78.3 fL — AB (ref 80.0–100.0)
Platelets: 202 10*3/uL (ref 150–440)
RBC: 3.28 MIL/uL — ABNORMAL LOW (ref 3.80–5.20)
RDW: 20.1 % — AB (ref 11.5–14.5)
WBC: 7.4 10*3/uL (ref 3.6–11.0)

## 2015-04-07 LAB — GLUCOSE, CAPILLARY: Glucose-Capillary: 108 mg/dL — ABNORMAL HIGH (ref 65–99)

## 2015-04-07 LAB — MAGNESIUM: MAGNESIUM: 1.7 mg/dL (ref 1.7–2.4)

## 2015-04-07 LAB — TROPONIN I: Troponin I: <0.03 ng/mL (ref <0.031)

## 2015-04-07 MED ORDER — BENZONATATE 100 MG PO CAPS: 100.0000 mg | ORAL_CAPSULE | Freq: Three times a day (TID) | ORAL | Status: DC | PRN

## 2015-04-07 MED ORDER — GUAIFENESIN ER 600 MG PO TB12
600.0000 mg | ORAL_TABLET | Freq: Two times a day (BID) | ORAL | Status: DC
  Administered 2015-04-07 – 2015-04-09 (×5): 600 mg via ORAL
  Filled 2015-04-07 (×5): qty 1

## 2015-04-07 MED ORDER — ACETAMINOPHEN 650 MG RE SUPP: 650.0000 mg | Freq: Four times a day (QID) | RECTAL | Status: DC | PRN

## 2015-04-07 MED ORDER — SODIUM CHLORIDE 0.9 % IJ SOLN: 3.0000 mL | INTRAMUSCULAR | Status: DC | PRN

## 2015-04-07 MED ORDER — SERTRALINE HCL 50 MG PO TABS
100.0000 mg | ORAL_TABLET | Freq: Every day | ORAL | Status: DC
  Administered 2015-04-07 – 2015-04-09 (×3): 100 mg via ORAL
  Filled 2015-04-07 (×3): qty 2

## 2015-04-07 MED ORDER — DEXTROSE 5 % IV SOLN
500.0000 mg | Freq: Once | INTRAVENOUS | Status: DC
  Filled 2015-04-07: qty 500

## 2015-04-07 MED ORDER — PANTOPRAZOLE SODIUM 40 MG PO TBEC
40.0000 mg | DELAYED_RELEASE_TABLET | Freq: Every day | ORAL | Status: DC
Start: 2015-04-07 — End: 2015-04-09
  Administered 2015-04-07 – 2015-04-09 (×3): 40 mg via ORAL
  Filled 2015-04-07 (×3): qty 1

## 2015-04-07 MED ORDER — SODIUM CHLORIDE 0.9 % IV SOLN: 250.0000 mL | INTRAVENOUS | Status: DC | PRN

## 2015-04-07 MED ORDER — DOCUSATE SODIUM 100 MG PO CAPS: 100.0000 mg | ORAL_CAPSULE | Freq: Every day | ORAL | Status: DC | PRN

## 2015-04-07 MED ORDER — MORPHINE SULFATE (PF) 4 MG/ML IV SOLN
4.0000 mg | Freq: Once | INTRAVENOUS | Status: AC
  Administered 2015-04-07: 4 mg via INTRAVENOUS
  Filled 2015-04-07: qty 1

## 2015-04-07 MED ORDER — NICOTINE 21 MG/24HR TD PT24
21.0000 mg | MEDICATED_PATCH | Freq: Every day | TRANSDERMAL | Status: DC
  Administered 2015-04-08 – 2015-04-09 (×2): 21 mg via TRANSDERMAL
  Filled 2015-04-07 (×2): qty 1

## 2015-04-07 MED ORDER — ENOXAPARIN SODIUM 40 MG/0.4ML ~~LOC~~ SOLN
40.0000 mg | SUBCUTANEOUS | Status: DC
  Administered 2015-04-07 – 2015-04-08 (×2): 40 mg via SUBCUTANEOUS
  Filled 2015-04-07 (×2): qty 0.4

## 2015-04-07 MED ORDER — GABAPENTIN 300 MG PO CAPS
300.0000 mg | ORAL_CAPSULE | Freq: Three times a day (TID) | ORAL | Status: DC
  Administered 2015-04-07 – 2015-04-09 (×7): 300 mg via ORAL
  Filled 2015-04-07 (×7): qty 1

## 2015-04-07 MED ORDER — SODIUM CHLORIDE 0.9 % IJ SOLN
3.0000 mL | Freq: Two times a day (BID) | INTRAMUSCULAR | Status: DC
  Administered 2015-04-07 – 2015-04-09 (×5): 3 mL via INTRAVENOUS

## 2015-04-07 MED ORDER — ONDANSETRON HCL 4 MG/2ML IJ SOLN: 4.0000 mg | Freq: Four times a day (QID) | INTRAMUSCULAR | Status: DC | PRN

## 2015-04-07 MED ORDER — ONDANSETRON HCL 4 MG/2ML IJ SOLN
4.0000 mg | Freq: Once | INTRAMUSCULAR | Status: AC
  Administered 2015-04-07: 4 mg via INTRAVENOUS
  Filled 2015-04-07: qty 2

## 2015-04-07 MED ORDER — METHYLPREDNISOLONE SODIUM SUCC 125 MG IJ SOLR
125.0000 mg | Freq: Once | INTRAMUSCULAR | Status: AC
  Administered 2015-04-07: 125 mg via INTRAVENOUS
  Filled 2015-04-07: qty 2

## 2015-04-07 MED ORDER — ALBUTEROL SULFATE (2.5 MG/3ML) 0.083% IN NEBU
INHALATION_SOLUTION | RESPIRATORY_TRACT | Status: AC
  Administered 2015-04-07: 2.5 mg via RESPIRATORY_TRACT
  Filled 2015-04-07: qty 3

## 2015-04-07 MED ORDER — ONDANSETRON HCL 4 MG PO TABS: 4.0000 mg | ORAL_TABLET | Freq: Four times a day (QID) | ORAL | Status: DC | PRN

## 2015-04-07 MED ORDER — ALBUTEROL SULFATE (2.5 MG/3ML) 0.083% IN NEBU
2.5000 mg | INHALATION_SOLUTION | Freq: Once | RESPIRATORY_TRACT | Status: AC
  Administered 2015-04-07: 2.5 mg via RESPIRATORY_TRACT

## 2015-04-07 MED ORDER — OXYCODONE-ACETAMINOPHEN 5-325 MG PO TABS
1.0000 | ORAL_TABLET | ORAL | Status: DC | PRN
  Administered 2015-04-07 – 2015-04-09 (×5): 1 via ORAL
  Filled 2015-04-07 (×5): qty 1

## 2015-04-07 MED ORDER — METHYLPREDNISOLONE SODIUM SUCC 125 MG IJ SOLR
60.0000 mg | Freq: Four times a day (QID) | INTRAMUSCULAR | Status: DC
  Administered 2015-04-07 – 2015-04-09 (×9): 60 mg via INTRAVENOUS
  Filled 2015-04-07 (×10): qty 2

## 2015-04-07 MED ORDER — MAGNESIUM SULFATE 2 GM/50ML IV SOLN
2.0000 g | Freq: Once | INTRAVENOUS | Status: AC
  Administered 2015-04-07: 2 g via INTRAVENOUS
  Filled 2015-04-07: qty 50

## 2015-04-07 MED ORDER — POTASSIUM 99 MG PO TABS: 1.0000 | ORAL_TABLET | Freq: Every day | ORAL | Status: DC

## 2015-04-07 MED ORDER — AMITRIPTYLINE HCL 50 MG PO TABS
50.0000 mg | ORAL_TABLET | Freq: Every evening | ORAL | Status: DC | PRN
  Administered 2015-04-09: 02:00:00 50 mg via ORAL
  Filled 2015-04-07: qty 1

## 2015-04-07 MED ORDER — DEXTROSE 5 % IV SOLN
500.0000 mg | INTRAVENOUS | Status: DC
  Administered 2015-04-08 – 2015-04-09 (×2): 500 mg via INTRAVENOUS
  Filled 2015-04-07 (×2): qty 500

## 2015-04-07 MED ORDER — ALBUTEROL SULFATE (2.5 MG/3ML) 0.083% IN NEBU
2.5000 mg | INHALATION_SOLUTION | RESPIRATORY_TRACT | Status: DC | PRN
  Administered 2015-04-07 – 2015-04-08 (×6): 2.5 mg via RESPIRATORY_TRACT
  Filled 2015-04-07 (×6): qty 3

## 2015-04-07 MED ORDER — ALPRAZOLAM 0.5 MG PO TABS
0.5000 mg | ORAL_TABLET | Freq: Every day | ORAL | Status: DC
  Administered 2015-04-07 – 2015-04-08 (×2): 0.5 mg via ORAL
  Filled 2015-04-07 (×2): qty 1

## 2015-04-07 MED ORDER — ACETAMINOPHEN 325 MG PO TABS: 650.0000 mg | ORAL_TABLET | Freq: Four times a day (QID) | ORAL | Status: DC | PRN

## 2015-04-07 NOTE — ED Notes (Signed)
Pt presents to ED via EMS from personal home with c/o of respiratory distress. EMS states pt has a hx of COPD and asthma. EMS states pt has been experiencing SOB sx for the past "few weeks" worsening this evening. EMS states pt self-administered x5 prescribed breathing treatments at home, without any relief. EMS states pt oxygen saturations maintained 88% on RA, with 91% on 4L. Pt states she is on oxygen at night per daily basis. Pt states she is current smoker. Pt arrived to ER alert and oriented x4, able to respond to verbal and physical commands appropriately.

## 2015-04-07 NOTE — Progress Notes (Signed)
   04/07/15 1200  Clinical Encounter Type  Visited With Patient  Visit Type Initial  Referral From Nurse  Consult/Referral To Chaplain  Spiritual Encounters  Spiritual Needs Literature  Advance Directives (For Healthcare)  Does patient have an advance directive? No  Would patient like information on creating an advanced directive? Yes - Transport plannerducational materials given  Provided pastoral presence, support and Advance Directive education and forms to patient.  Pt wants to read over and discuss with family.  Pt will have nurse contact chaplain when ready to sign.  Asbury Automotive GroupChaplain Edgar Corrigan-pager [phone]

## 2015-04-07 NOTE — ED Notes (Signed)
RT at bedside. BiPaP placed on patient by MD orders.

## 2015-04-07 NOTE — Care Management (Addendum)
Patient presents from home.  she has home 02 through [facility].  PCP is Dr Mar[facility]in at [facility] and is current with appointments.  She currently does not have any services in the home.  At present, experiencing significant respiratory difficulty and on BIPAPP.  Her husband denies that patient has any issues accessing medical care, obtaining medications or with transportation.  Have left message with [facility] to determine home 02 order- whether it is continuous or nocturnal.  Patient says she wears it when she needs it.  Patient's 02 order with [facility] is for continuous 2 liters

## 2015-04-07 NOTE — ED Notes (Signed)
X-ray at bedside

## 2015-04-07 NOTE — Progress Notes (Signed)
Dr Imogene Burnhen- wound consult

## 2015-04-07 NOTE — ED Provider Notes (Signed)
[facility] Emergency Department Provider Note  ____________________________________________  Time seen: Approximately 433 AM  I have reviewed the triage vital signs and the nursing notes.   HISTORY  Chief Complaint Respiratory Distress    HPI Julia [name] is a 54 y.o. female who comes into the hospital today with difficulty breathing. The patienthas a history of COPD and has been fighting a cold for the last few weeks. Today she has felt that her breathing worse and called the rescue squad for evaluation. The patient reports that she has take over 6 breathing treatments in the last few hours. Her oxygen saturation was 88% on room air. The patient reports that she wears oxygen sometimes at night but not during the day. The patient has been tripoding according to EMS and is coughing up white sputum. The patient has not had any fevers. Although the patient has been having cough and shortness of breath for weeks the symptoms are worse tonight. The patient reports that her chest feels tight and she was sweaty but no fevers. The patient has abdominal pain in the past.    Past Medical History  Diagnosis Date  . COPD (chronic obstructive pulmonary disease) (HCC)     There are no active problems to display for this patient.   Past Surgical History  Procedure Laterality Date  . Hernia repair      Current Outpatient Rx  Name  Route  Sig  Dispense  Refill  . ALPRAZolam (XANAX) 0.5 MG tablet   Oral   Take 0.5 mg by mouth at bedtime.         Marland Kitchen amitriptyline (ELAVIL) 25 MG tablet   Oral   Take 50 mg by mouth at bedtime as needed for sleep.         Marland Kitchen docusate sodium (COLACE) 100 MG capsule   Oral   Take 100 mg by mouth daily as needed for mild constipation.         . gabapentin (NEURONTIN) 300 MG capsule   Oral   Take 300 mg by mouth 3 (three) times daily.         . pantoprazole (PROTONIX) 40 MG tablet   Oral   Take 40 mg by mouth daily.        . Potassium 99 MG TABS   Oral   Take 1 tablet by mouth daily.         . sertraline (ZOLOFT) 100 MG tablet   Oral   Take 100 mg by mouth daily.           Allergies Atrovent  No family history on file.  Social History Social History  Substance Use Topics  . Smoking status: Current Every Day Smoker  . Smokeless tobacco: None  . Alcohol Use: No    Review of Systems Constitutional: No fever/chills Eyes: No visual changes. ENT: No sore throat. Cardiovascular: chest tightness Respiratory:  shortness of breath and cough Gastrointestinal: No abdominal pain.  No nausea, no vomiting.  No diarrhea.  No constipation. Genitourinary: Negative for dysuria. Musculoskeletal: Negative for back pain. Skin: Negative for rash. Neurological: Negative for headaches, focal weakness or numbness.  10-point ROS otherwise negative.  ____________________________________________   PHYSICAL EXAM:  VITAL SIGNS: ED Triage Vitals  Enc Vitals Group     BP 04/07/15 0443 94/73 mmHg     Pulse Rate 04/07/15 0438 91     Resp 04/07/15 0443 26     Temp 04/07/15 0443 97.6 F (36.4 C)  Temp Source 04/07/15 0443 Axillary     SpO2 04/07/15 0438 88 %     Weight 04/07/15 0443 159 lb (72.122 kg)     Height 04/07/15 0443  (1.753 m)     Head Cir --      Peak Flow --      Pain Score 04/07/15 0443 8     Pain Loc --      Pain Edu? --      Excl. in GC? --     Constitutional: Alert and oriented. Well appearing and in moderate to severe distress. Eyes: Conjunctivae are normal. PERRL. EOMI. Head: Atraumatic. Nose: No congestion/rhinnorhea. Mouth/Throat: Mucous membranes are moist.  Oropharynx non-erythematous. Cardiovascular: Normal rate, regular rhythm. Grossly normal heart sounds.  Good peripheral circulation. Respiratory: Increased respiratory effort, tripoding with diminished breath sounds throughout all lung fields and very tight wheezes. Gastrointestinal: Soft and nontender. No  distention. Positive bowel sounds Musculoskeletal: No lower extremity tenderness nor edema.   Neurologic:  Normal speech and language. No gross focal neurologic deficits are appreciated.  Skin:  Skin is warm, dry and intact. Psychiatric: Mood and affect are normal.   ____________________________________________   LABS (all labs ordered are listed, but only abnormal results are displayed)  Labs Reviewed  CBC - Abnormal; Notable for the following:    RBC 3.28 (*)    Hemoglobin 8.6 (*)    HCT 25.7 (*)    MCV 78.3 (*)    RDW 20.1 (*)    All other components within normal limits  BLOOD GAS, VENOUS - Abnormal; Notable for the following:    Bicarbonate 30.5 (*)    Acid-Base Excess 3.7 (*)    All other components within normal limits  GLUCOSE, CAPILLARY - Abnormal; Notable for the following:    Glucose-Capillary 108 (*)    All other components within normal limits  COMPREHENSIVE METABOLIC PANEL  TROPONIN I  CBG MONITORING, ED   ____________________________________________  EKG  ED ECG REPORT I, Rebecka Apley, the attending physician, personally viewed and interpreted this ECG.   Date: 04/07/2015  EKG Time: 447  Rate: 87  Rhythm: normal sinus rhythm  Axis: right  Intervals normal  ST&T Change:  none  ____________________________________________  RADIOLOGY  CXR: Mild cardiomegaly, similar mild interstitial prominence may be seen with atypcial infection or pulmonary edema without focal consolidation. ____________________________________________   PROCEDURES  Procedure(s) performed: None  Critical Care performed: Yes, see critical care note(s)  CRITICAL CARE Performed by: Lucrezia Europe P   Total critical care time: 30 minutes  Critical care time was exclusive of separately billable procedures and treating other patients.  Critical care was necessary to treat or prevent imminent or life-threatening deterioration.  Critical care was time spent  personally by me on the following activities: development of treatment plan with patient and/or surrogate as well as nursing, discussions with consultants, evaluation of patient's response to treatment, examination of patient, obtaining history from patient or surrogate, ordering and performing treatments and interventions, ordering and review of laboratory studies, ordering and review of radiographic studies, pulse oximetry and re-evaluation of patient's condition.  ____________________________________________   INITIAL IMPRESSION / ASSESSMENT AND PLAN / ED COURSE  Pertinent labs & imaging results that were available during my care of the patient were reviewed by me and considered in my medical decision making (see chart for details).  The patient received multiple nebs prior to coming into the emergency department but was still very tight with minimal air movement.  The patient was placed on BiPAP when she immediately arrived to help open up her airway. The patient was also given albuterol. I ordered Solu-Medrol and magnesium sulfate for the patient. The patient's saturations were 88% on room air when she arrived. The patient be admitted to the hospitalist service for further evaluation and treatment of her bronchitis/COPD exacerbation. ____________________________________________   FINAL CLINICAL IMPRESSION(S) / ED DIAGNOSES  Final diagnoses:  SOB (shortness of breath)      Rebecka ApleyAllison P Webster, MD 04/07/15 [phone]

## 2015-04-07 NOTE — ED Notes (Signed)
Spoke with Admitting MD chen. Confirmed ok to take patient off bipap and place on nasal canula. Patient is now on 2L O2 sat 95

## 2015-04-07 NOTE — H&P (Signed)
[facility] [place] [facility]   PATIENT NAME: Julia [name]    MR#:  [id number]  DATE OF BIRTH:  08/25/1960  DATE OF ADMISSION:  04/07/2015  PRIMARY CARE PHYSICIAN: No primary care provider on file.   REQUESTING/REFERRING PHYSICIAN: Rebecka ApleyAllison P Webster, MD  CHIEF COMPLAINT:   Chief Complaint  Patient presents with  . Respiratory Distress   Cough with sputum, wheezing and shortness of breath for 2 weeks, worsening 1 day. HISTORY OF PRESENT ILLNESS:  Julia HawkManchell Dondlinger  is a 54 y.o. female with a known history of COPD. The patient presently ED with above chief complaint. Patient got a cold 2 weeks ago and started to have cough with sputum, wheezing and shortness of breath. The symptoms has been worsening for the past one day. She used nebulizer without improvement. She was found hypoxia with the oxygen level at 88% in room air. She was treated with nebulizer and IV Solu-Medrol, and put on BiPAP in the ED. Chest x-ray didn't show any infiltrate.  PAST MEDICAL HISTORY:   Past Medical History  Diagnosis Date  . COPD (chronic obstructive pulmonary disease) (HCC)   . Asthma    anxiety.  PAST SURGICAL HISTORY:   Past Surgical History  Procedure Laterality Date  . Hernia repair      SOCIAL HISTORY:   Social History  Substance Use Topics  . Smoking status: Current Every Day Smoker -- 0.50 packs/day    Types: Cigarettes  . Smokeless tobacco: Not on file  . Alcohol Use: No    FAMILY HISTORY:  History reviewed. No pertinent family history. mother has sarcoidosis.  DRUG ALLERGIES:   Allergies  Allergen Reactions  . Atrovent [Ipratropium] Other (See Comments) and Shortness Of Breath    Unknown     REVIEW OF SYSTEMS:  CONSTITUTIONAL: No fever, fatigue or weakness.  EYES: No blurred or double vision.  EARS, NOSE, AND THROAT: No tinnitus or ear pain.  RESPIRATORY: Has cough, shortness of breath, wheezing but no hemoptysis.  CARDIOVASCULAR: No  chest pain, orthopnea, edema.  GASTROINTESTINAL: No nausea, vomiting, diarrhea or abdominal pain.  GENITOURINARY: No dysuria, hematuria.  ENDOCRINE: No polyuria, nocturia,  HEMATOLOGY: No anemia, easy bruising or bleeding SKIN: No rash or lesion. MUSCULOSKELETAL: No joint pain or arthritis.   NEUROLOGIC: No tingling, numbness, weakness.  PSYCHIATRY: No anxiety or depression.   MEDICATIONS AT HOME:   Prior to Admission medications   Medication Sig Start Date End Date Taking? Authorizing Provider  ALPRAZolam Prudy Feeler(XANAX) 0.5 MG tablet Take 0.5 mg by mouth at bedtime.   Yes Historical Provider, MD  amitriptyline (ELAVIL) 25 MG tablet Take 50 mg by mouth at bedtime as needed for sleep.   Yes Historical Provider, MD  docusate sodium (COLACE) 100 MG capsule Take 100 mg by mouth daily as needed for mild constipation.   Yes Historical Provider, MD  gabapentin (NEURONTIN) 300 MG capsule Take 300 mg by mouth 3 (three) times daily.   Yes Historical Provider, MD  pantoprazole (PROTONIX) 40 MG tablet Take 40 mg by mouth daily.   Yes Historical Provider, MD  Potassium 99 MG TABS Take 1 tablet by mouth daily.   Yes Historical Provider, MD  sertraline (ZOLOFT) 100 MG tablet Take 100 mg by mouth daily.   Yes Historical Provider, MD      VITAL SIGNS:  Blood pressure 112/72, pulse 93, temperature 98.3 F (36.8 C), temperature source Oral, resp. rate 20, height 5\' 5"  (1.651 m), weight 68.04 kg (150  lb), SpO2 93 %.  PHYSICAL EXAMINATION:  GENERAL:  54 y.o.-year-old patient lying in the bed with no acute distress.  EYES: Pupils equal, round, reactive to light and accommodation. No scleral icterus. Extraocular muscles intact.  HEENT: Head atraumatic, normocephalic. Oropharynx and nasopharynx clear.  NECK:  Supple, no jugular venous distention. No thyroid enlargement, no tenderness.  LUNGS: Very diminished breath sounds bilaterally, expiratory wheezing. Mild use of accessory muscles of respiration.   CARDIOVASCULAR: S1, S2 normal. No murmurs, rubs, or gallops.  ABDOMEN: Soft, nontender, nondistended. Bowel sounds present. No organomegaly or mass. Abdominal hernia repair wound in dressing. EXTREMITIES: No pedal edema, cyanosis, or clubbing.  NEUROLOGIC: Cranial nerves II through XII are intact. Muscle strength 5/5 in all extremities. Sensation intact. Gait not checked.  PSYCHIATRIC: The patient is alert and oriented x 3.  SKIN: No obvious rash, lesion, or ulcer.   LABORATORY PANEL:   CBC  Recent Labs Lab 04/07/15 0442  WBC 7.4  HGB 8.6*  HCT 25.7*  PLT 202   ------------------------------------------------------------------------------------------------------------------  Chemistries   Recent Labs Lab 04/07/15 0442  NA 138  K 4.3  CL 103  CO2 29  GLUCOSE 107*  BUN 16  CREATININE 0.86  CALCIUM 9.2  MG 1.7  AST 18  ALT 13*  ALKPHOS 71  BILITOT 0.3   ------------------------------------------------------------------------------------------------------------------  Cardiac Enzymes  Recent Labs Lab 04/07/15 0442  TROPONINI <0.03   ------------------------------------------------------------------------------------------------------------------  RADIOLOGY:  Dg Chest Port 1 View  04/07/2015  CLINICAL DATA:  Shortness of breath.  History COPD. EXAM: PORTABLE CHEST 1 VIEW COMPARISON:  Chest radiograph March 31, 2014 FINDINGS: The cardiac silhouette is mildly enlarged, similar. Similar mild pulmonary vascular congestion. Similar mild interstitial prominence. No pleural effusion or focal consolidation. No pneumothorax. Soft tissue planes and included osseous structures are nonsuspicious. IMPRESSION: Mild cardiomegaly. Similar mild interstitial prominence may be seen with atypical infection or pulmonary edema without focal consolidation. Electronically Signed   By: Awilda Metro M.D.   On: 04/07/2015 05:15    EKG:   Orders placed or performed during the  hospital encounter of 04/07/15  . ED EKG  . ED EKG  . EKG 12-Lead  . EKG 12-Lead    IMPRESSION AND PLAN:   COPD exacerbation Hypoxia Anemia of chronic disease Tobacco abuse History of anxiety.  The patient is admitted to medical floor. I will continue DuoNeb when necessary, IV Solu-Medrol, Zithromax and add Spiriva. Give Mucinex and Tessalon when necessary. Continue oxygen by nasal cannular. Smoking cessation was counseled for 3 minutes. The patient refuses nicotine patch.   All the records are reviewed and case discussed with ED provider. Management plans discussed with the patient, family and they are in agreement.  CODE STATUS: Full code  TOTAL TIME TAKING CARE OF THIS PATIENT: 55 minutes.    Shaune Pollack M.D on 04/07/2015 at 2:00 PM  Between 7am to 6pm - Pager - [phone]  After 6pm go to [email or web address] - password [email or web address] [email or web address]  [place] [facility]  Office  [phone]  CC: Primary care physician; No primary care provider on file.

## 2015-04-08 LAB — BLOOD GAS, VENOUS
Acid-Base Excess: 3.7 mmol/L — ABNORMAL HIGH (ref 0.0–3.0)
BICARBONATE: 30.5 meq/L — AB (ref 21.0–28.0)
Delivery systems: POSITIVE
FIO2: 0.35
PCO2 VEN: 54 mmHg (ref 44.0–60.0)
PH VEN: 7.36 (ref 7.320–7.430)
Patient temperature: 37

## 2015-04-08 LAB — BASIC METABOLIC PANEL
Anion gap: 7 (ref 5–15)
BUN: 20 mg/dL (ref 6–20)
CALCIUM: 9.3 mg/dL (ref 8.9–10.3)
CHLORIDE: 98 mmol/L — AB (ref 101–111)
CO2: 30 mmol/L (ref 22–32)
CREATININE: 0.65 mg/dL (ref 0.44–1.00)
GFR calc non Af Amer: >60 mL/min (ref >60)
Glucose, Bld: 149 mg/dL — ABNORMAL HIGH (ref 65–99)
Potassium: 4.8 mmol/L (ref 3.5–5.1)
SODIUM: 135 mmol/L (ref 135–145)

## 2015-04-08 LAB — CBC
HCT: 26.6 % — ABNORMAL LOW (ref 35.0–47.0)
Hemoglobin: 8.3 g/dL — ABNORMAL LOW (ref 12.0–16.0)
MCH: 24.6 pg — AB (ref 26.0–34.0)
MCHC: 31.2 g/dL — ABNORMAL LOW (ref 32.0–36.0)
MCV: 78.8 fL — AB (ref 80.0–100.0)
PLATELETS: 214 10*3/uL (ref 150–440)
RBC: 3.37 MIL/uL — AB (ref 3.80–5.20)
RDW: 20.1 % — AB (ref 11.5–14.5)
WBC: 5.7 10*3/uL (ref 3.6–11.0)

## 2015-04-08 NOTE — Plan of Care (Signed)
Problem: Discharge Progression Outcomes Goal: Dyspnea controlled Outcome: Progressing Pt continues on 2L O2.  Pt continues to be weak, but VSS.  Pt received 1 unit of PRBC and tolerated well.

## 2015-04-08 NOTE — Progress Notes (Addendum)
[facility] [place] [facility]   PATIENT NAME: Julia [name]    MR#:  [id number]  DATE OF BIRTH:  09/06/1960  SUBJECTIVE:  CHIEF COMPLAINT:   Chief Complaint  Patient presents with  . Respiratory Distress   Still cough, sputum, wheezing, SOB. REVIEW OF SYSTEMS:  CONSTITUTIONAL: No fever, fatigue or weakness.  EYES: No blurred or double vision.  EARS, NOSE, AND THROAT: No tinnitus or ear pain.  RESPIRATORY: Has cough, shortness of breath, wheezing but no hemoptysis.  CARDIOVASCULAR: No chest pain, orthopnea, edema.  GASTROINTESTINAL: No nausea, vomiting, diarrhea or abdominal pain.  GENITOURINARY: No dysuria, hematuria.  ENDOCRINE: No polyuria, nocturia,  HEMATOLOGY: No anemia, easy bruising or bleeding SKIN: No rash or lesion. MUSCULOSKELETAL: No joint pain or arthritis.   NEUROLOGIC: No tingling, numbness, weakness.  PSYCHIATRY: No anxiety or depression.   DRUG ALLERGIES:   Allergies  Allergen Reactions  . Atrovent [Ipratropium] Other (See Comments) and Shortness Of Breath    Unknown     VITALS:  Blood pressure 137/80, pulse 94, temperature 98 F (36.7 C), temperature source Oral, resp. rate 20, height 5\' 5"  (1.651 m), weight 68.04 kg (150 lb), SpO2 98 %.  PHYSICAL EXAMINATION:  GENERAL:  54 y.o.-year-old patient lying in the bed with no acute distress.  EYES: Pupils equal, round, reactive to light and accommodation. No scleral icterus. Extraocular muscles intact.  HEENT: Head atraumatic, normocephalic. Oropharynx and nasopharynx clear.  NECK:  Supple, no jugular venous distention. No thyroid enlargement, no tenderness.  LUNGS: Very diminished breath sounds bilaterally,  wheezing, no rales,rhonchi or crepitation. Mild use of accessory muscles of respiration.  CARDIOVASCULAR: S1, S2 normal. No murmurs, rubs, or gallops.  ABDOMEN: Soft, nontender, nondistended. Bowel sounds present. No organomegaly or mass.  Hernia repair wound in  dressing. EXTREMITIES: No pedal edema, cyanosis, or clubbing.  NEUROLOGIC: Cranial nerves II through XII are intact. Muscle strength 5/5 in all extremities. Sensation intact. Gait not checked.  PSYCHIATRIC: The patient is alert and oriented x 3.  SKIN: No obvious rash, lesion, or ulcer.    LABORATORY PANEL:   CBC  Recent Labs Lab 04/08/15 0534  WBC 5.7  HGB 8.3*  HCT 26.6*  PLT 214   ------------------------------------------------------------------------------------------------------------------  Chemistries   Recent Labs Lab 04/07/15 0442 04/08/15 0534  NA 138 135  K 4.3 4.8  CL 103 98*  CO2 29 30  GLUCOSE 107* 149*  BUN 16 20  CREATININE 0.86 0.65  CALCIUM 9.2 9.3  MG 1.7  --   AST 18  --   ALT 13*  --   ALKPHOS 71  --   BILITOT 0.3  --    ------------------------------------------------------------------------------------------------------------------  Cardiac Enzymes  Recent Labs Lab 04/07/15 0442  TROPONINI <0.03   ------------------------------------------------------------------------------------------------------------------  RADIOLOGY:  Dg Chest Port 1 View  04/07/2015  CLINICAL DATA:  Shortness of breath.  History COPD. EXAM: PORTABLE CHEST 1 VIEW COMPARISON:  Chest radiograph March 31, 2014 FINDINGS: The cardiac silhouette is mildly enlarged, similar. Similar mild pulmonary vascular congestion. Similar mild interstitial prominence. No pleural effusion or focal consolidation. No pneumothorax. Soft tissue planes and included osseous structures are nonsuspicious. IMPRESSION: Mild cardiomegaly. Similar mild interstitial prominence may be seen with atypical infection or pulmonary edema without focal consolidation. Electronically Signed   By: Awilda Metroourtnay  Bloomer M.D.   On: 04/07/2015 05:15    EKG:   Orders placed or performed during the hospital encounter of 04/07/15  . ED EKG  . ED EKG  .  EKG 12-Lead  . EKG 12-Lead    ASSESSMENT AND PLAN:    COPD exacerbation continue DuoNeb when necessary, IV Solu-Medrol, Zithromax and add Spiriva. Mucinex and Tessalon when necessary.  Hypoxia. Continue oxygen by nasal cannula. NEB.  Anemia of chronic disease. Stable.  Tobacco abuse. Smoking cessation was counseled for 3 minutes. The patient refuses nicotine patch . History of anxiety.     All the records are reviewed and case discussed with Care Management/Social Workerr. Management plans discussed with the patient, family and they are in agreement.  CODE STATUS: Full code.  TOTAL TIME TAKING CARE OF THIS PATIENT: 37 minutes.  Greater than 50% time was spent on coordination of care and face-to-face counseling.  POSSIBLE D/C IN 3 DAYS, DEPENDING ON CLINICAL CONDITION.   Shaune Pollack M.D on 04/08/2015 at 1:54 PM  Between 7am to 6pm - Pager - [phone]  After 6pm go to [email or web address] - password [email or web address] [facility]  [place] [facility]  Office  [phone]  CC: Primary care physician; No primary care provider on file.

## 2015-04-08 NOTE — Progress Notes (Signed)
Problem: Discharge Progression Outcomes Goal: no c/o Dyspnea on exertion  Pt continues on 2L O2.  Patient ambulated in the hall this shift  Continues on IV Solumedrol  Possible discharge to home tomorrow

## 2015-04-08 NOTE — Consult Note (Signed)
WOC wound consult note Reason for Consult:Midline surgical wound from open hernia repair. 3 weeks out.  Lucila MaineGrandson has been performing BID wound care.   Wound type:Midline surgical wound.  Duration:  3 weeks Pressure Ulcer POA: N/A Measurement: 4 cm x 1.3 cm x 3 cm  Wound bed: pink, nongranulating tissue.  Mesh visible in wound bed Drainage (amount, consistency, odor) Moderate serosanguinous drainage.  No odor.  Periwound:Intact Dressing procedure/placement/frequency:Cleanse wound with NS and pat gently dry.  Gently fill wound bed with NS moist gauze.  Cover with 4x4 gauze and tape.  Change BID.  Will not follow at this time.  Please re-consult if needed.  Maple HudsonKaren Riann Oman RN BSN CWON Pager [phone]

## 2015-04-09 MED ORDER — PREDNISONE 10 MG PO TABS
10.0000 mg | ORAL_TABLET | Freq: Every day | ORAL | Status: DC
Start: 2015-04-09 — End: 2015-11-10

## 2015-04-09 MED ORDER — NICOTINE 21 MG/24HR TD PT24: 21.0000 mg | MEDICATED_PATCH | Freq: Every day | TRANSDERMAL | Status: DC

## 2015-04-09 MED ORDER — PREDNISONE 20 MG PO TABS: 60.0000 mg | ORAL_TABLET | Freq: Every day | ORAL | Status: DC

## 2015-04-09 MED ORDER — AZITHROMYCIN 250 MG PO TABS: 250.0000 mg | ORAL_TABLET | Freq: Every day | ORAL | Status: DC

## 2015-04-09 MED ORDER — BENZONATATE 100 MG PO CAPS: 100.0000 mg | ORAL_CAPSULE | Freq: Three times a day (TID) | ORAL | Status: DC | PRN

## 2015-04-09 MED ORDER — ALBUTEROL SULFATE HFA 108 (90 BASE) MCG/ACT IN AERS: 2.0000 | INHALATION_SPRAY | Freq: Four times a day (QID) | RESPIRATORY_TRACT | Status: DC | PRN

## 2015-04-09 NOTE — Progress Notes (Signed)
Pt for discharge home. Goals met for discharge. Instructions discussed with pt.  presc and home meds discussed.  Diet / avctivity and f/u discussed.  No resp distress noted pt up amb in hall at intevals.  Sl d/cd.  Ready for discharge . Waiting for husband to pick her up.

## 2015-04-09 NOTE — Plan of Care (Signed)
Problem: Education: Goal: Knowledge of [facility] Education information/materials will improve Outcome: Progressing Pt continues on solumedrol, zithromax, duonebs, spiriva, mucinex and tessaon. She has been up walking around frequently this shift and continuing O2 at 2L. Pt is doing her own dressing changes from a midline surgical wound r/t open hernia repair (prior to this visit). VSS

## 2015-04-09 NOTE — Discharge Summary (Signed)
[facility] [place] [facility]   PATIENT NAME: Julia [name]    MR#:  [id number]  DATE OF BIRTH:  May 22, 1960  DATE OF ADMISSION:  04/07/2015 ADMITTING PHYSICIAN: Shaune Pollack, MD  DATE OF DISCHARGE: 04/09/2015  PRIMARY CARE PHYSICIAN:     ADMISSION DIAGNOSIS:  SOB (shortness of breath) [R06.02] Bronchitis [J40] COPD exacerbation (HCC) [J44.1]  DISCHARGE DIAGNOSIS:  Active Problems:   COPD exacerbation (HCC)   SECONDARY DIAGNOSIS:   Past Medical History  Diagnosis Date  . COPD (chronic obstructive pulmonary disease) (HCC)   . Asthma     HOSPITAL COURSE:    54 year old female with known history of COPD and ongoing tobacco use who presented with COPD exacerbation. For further details please refer the H&P.  1. Acute hypoxic respiratory failure: This is secondary to COPD exacerbation. Patient wears oxygen at night and when necessary in the daytime.   2. COPD exacerbation: Patient has greatly improved. She is able to speak full sentences and has minimal wheezing at discharge. She continues to have a cough. She was started on IV steroids and antibiotics. She will continue on steroid taper and antibiotics. She will continue her oxygen.   3. Anemia of chronic disease: Hemoglobin remained stable.  4. Tobacco abuse: Patient was counseled by admitting physician. Patient will be discharged with nicotine patch.     5. Midline surgical wound from open hernia repair: Patient was seen by wound care. Continue to follow recommendations  Cleanse wound with NS and pat gently dry. Gently fill wound bed with NS moist gauze. Cover with 4x4 gauze and tape. Change BID DISCHARGE CONDITIONS AND DIET:   Patient will be discharged home in stable condition   regular diet  CONSULTS OBTAINED:     DRUG ALLERGIES:   Allergies  Allergen Reactions  . Atrovent [Ipratropium] Other (See Comments) and Shortness Of Breath    Unknown     DISCHARGE MEDICATIONS:    Current Discharge Medication List    START taking these medications   Details  albuterol (PROVENTIL HFA;VENTOLIN HFA) 108 (90 BASE) MCG/ACT inhaler Inhale 2 puffs into the lungs every 6 (six) hours as needed for wheezing or shortness of breath. Qty: 1 Inhaler, Refills: 2    azithromycin (ZITHROMAX) 250 MG tablet Take 1 tablet (250 mg total) by mouth daily. Qty: 6 each, Refills: 0    benzonatate (TESSALON) 100 MG capsule Take 1 capsule (100 mg total) by mouth 3 (three) times daily as needed for cough. Qty: 20 capsule, Refills: 0    nicotine (NICODERM CQ - DOSED IN MG/24 HOURS) 21 mg/24hr patch Place 1 patch (21 mg total) onto the skin daily. Qty: 28 patch, Refills: 0    predniSONE (DELTASONE) 10 MG tablet Take 1 tablet (10 mg total) by mouth daily with breakfast. Qty: 20 tablet, Refills: 0      CONTINUE these medications which have NOT CHANGED   Details  ALPRAZolam (XANAX) 0.5 MG tablet Take 0.5 mg by mouth at bedtime.    amitriptyline (ELAVIL) 25 MG tablet Take 50 mg by mouth at bedtime as needed for sleep.    docusate sodium (COLACE) 100 MG capsule Take 100 mg by mouth daily as needed for mild constipation.    gabapentin (NEURONTIN) 300 MG capsule Take 300 mg by mouth 3 (three) times daily.    pantoprazole (PROTONIX) 40 MG tablet Take 40 mg by mouth daily.    Potassium 99 MG TABS Take 1 tablet by mouth daily.  sertraline (ZOLOFT) 100 MG tablet Take 100 mg by mouth daily.              Today   CHIEF COMPLAINT:   Patient is doing well this morning. She reports no increased shortness of breath or dyspnea exertion. She continues have a cough however feels much better than she did on admission. She feels she is almost at her baseline.    VITAL SIGNS:  Blood pressure 129/78, pulse 76, temperature 97.5 F (36.4 C), temperature source Oral, resp. rate 18, height 5\' 5"  (1.651 m), weight 68.04 kg (150 lb), SpO2 96 %.   REVIEW OF SYSTEMS:  Review of Systems   Constitutional: Negative for fever, chills and malaise/fatigue.  HENT: Negative for sore throat.   Eyes: Negative for blurred vision.  Respiratory: Negative for cough, hemoptysis, shortness of breath and wheezing.   Cardiovascular: Negative for chest pain, palpitations and leg swelling.  Gastrointestinal: Negative for nausea, vomiting, abdominal pain, diarrhea and blood in stool.  Genitourinary: Negative for dysuria.  Musculoskeletal: Negative for back pain.  Neurological: Negative for dizziness, tremors and headaches.  Endo/Heme/Allergies: Does not bruise/bleed easily.     PHYSICAL EXAMINATION:  GENERAL:  54 y.o.-year-old patient lying in the bed with no acute distress.  NECK:  Supple, no jugular venous distention. No thyroid enlargement, no tenderness.  LUNGS: increased work of breathing. Positive minimal wheezing with good air movement.   CARDIOVASCULAR: S1, S2 normal. No murmurs, rubs, or gallops.  ABDOMEN: Soft, non-tender, non-distended. Bowel sounds present. No organomegaly or mass.  EXTREMITIES: No pedal edema, cyanosis, or clubbing.  PSYCHIATRIC: The patient is alert and oriented x 3.  SKIN:  midline surgical wound from open hernia repair   DATA REVIEW:   CBC  Recent Labs Lab 04/08/15 0534  WBC 5.7  HGB 8.3*  HCT 26.6*  PLT 214    Chemistries   Recent Labs Lab 04/07/15 0442 04/08/15 0534  NA 138 135  K 4.3 4.8  CL 103 98*  CO2 29 30  GLUCOSE 107* 149*  BUN 16 20  CREATININE 0.86 0.65  CALCIUM 9.2 9.3  MG 1.7  --   AST 18  --   ALT 13*  --   ALKPHOS 71  --   BILITOT 0.3  --     Cardiac Enzymes  Recent Labs Lab 04/07/15 0442  TROPONINI <0.03    Microbiology Results  @[email or web address] @  RADIOLOGY:  No results found.    Management plans discussed with the patient and she is in agreement. Stable for discharge home  Patient should follow up with PCP in 1 week  CODE STATUS:     Code Status Orders        Start     Ordered   04/07/15  0954  Full code   Continuous     04/07/15 0953      TOTAL TIME TAKING CARE OF THIS PATIENT: 33 minutes.    Note: This dictation was prepared with Dragon dictation along with smaller phrase technology. Any transcriptional errors that result from this process are unintentional.  Lakeva Hollon M.D on 04/09/2015 at 10:34 AM  Between 7am to 6pm - Pager - [phone] After 6pm go to [email or web address] - password [email or web address] [facility]  [facility]  Office  [phone]  CC: Primary care physician; No primary care provider on file.

## 2015-09-05 DIAGNOSIS — H2512 Age-related nuclear cataract, left eye: Secondary | ICD-10-CM | POA: Insufficient documentation

## 2015-11-08 ENCOUNTER — Emergency Department: Payer: Medicaid Other

## 2015-11-08 ENCOUNTER — Inpatient Hospital Stay
Admission: EM | Admit: 2015-11-08 | Discharge: 2015-11-10 | DRG: 189 | Disposition: A | Discharge status: Home / Self Care | Payer: Medicaid Other | Attending: Internal Medicine | Admitting: Internal Medicine

## 2015-11-08 DIAGNOSIS — F17210 Nicotine dependence, cigarettes, uncomplicated: Secondary | ICD-10-CM | POA: Diagnosis present

## 2015-11-08 DIAGNOSIS — J9621 Acute and chronic respiratory failure with hypoxia: Secondary | ICD-10-CM | POA: Diagnosis present

## 2015-11-08 DIAGNOSIS — Z888 Allergy status to other drugs, medicaments and biological substances status: Secondary | ICD-10-CM | POA: Diagnosis not present

## 2015-11-08 DIAGNOSIS — R05 Cough: Secondary | ICD-10-CM

## 2015-11-08 DIAGNOSIS — Z716 Tobacco abuse counseling: Secondary | ICD-10-CM | POA: Diagnosis not present

## 2015-11-08 DIAGNOSIS — J441 Chronic obstructive pulmonary disease with (acute) exacerbation: Secondary | ICD-10-CM | POA: Diagnosis present

## 2015-11-08 DIAGNOSIS — F419 Anxiety disorder, unspecified: Secondary | ICD-10-CM | POA: Diagnosis present

## 2015-11-08 DIAGNOSIS — I472 Ventricular tachycardia: Secondary | ICD-10-CM | POA: Diagnosis present

## 2015-11-08 DIAGNOSIS — J9601 Acute respiratory failure with hypoxia: Secondary | ICD-10-CM

## 2015-11-08 DIAGNOSIS — J9600 Acute respiratory failure, unspecified whether with hypoxia or hypercapnia: Secondary | ICD-10-CM

## 2015-11-08 DIAGNOSIS — D649 Anemia, unspecified: Secondary | ICD-10-CM | POA: Diagnosis present

## 2015-11-08 DIAGNOSIS — Z7951 Long term (current) use of inhaled steroids: Secondary | ICD-10-CM | POA: Diagnosis not present

## 2015-11-08 DIAGNOSIS — Z720 Tobacco use: Secondary | ICD-10-CM

## 2015-11-08 DIAGNOSIS — Z9981 Dependence on supplemental oxygen: Secondary | ICD-10-CM

## 2015-11-08 DIAGNOSIS — Z79899 Other long term (current) drug therapy: Secondary | ICD-10-CM

## 2015-11-08 DIAGNOSIS — J9602 Acute respiratory failure with hypercapnia: Secondary | ICD-10-CM

## 2015-11-08 DIAGNOSIS — R0600 Dyspnea, unspecified: Secondary | ICD-10-CM

## 2015-11-08 HISTORY — DX: Acute respiratory failure with hypoxia: J96.01

## 2015-11-08 LAB — COMPREHENSIVE METABOLIC PANEL
ALT: 18 U/L (ref 14–54)
ANION GAP: 8 (ref 5–15)
AST: 26 U/L (ref 15–41)
Albumin: 4 g/dL (ref 3.5–5.0)
Alkaline Phosphatase: 66 U/L (ref 38–126)
BILIRUBIN TOTAL: 0.3 mg/dL (ref 0.3–1.2)
BUN: 20 mg/dL (ref 6–20)
CALCIUM: 9.6 mg/dL (ref 8.9–10.3)
CO2: 28 mmol/L (ref 22–32)
CREATININE: 0.8 mg/dL (ref 0.44–1.00)
Chloride: 102 mmol/L (ref 101–111)
Glucose, Bld: 123 mg/dL — ABNORMAL HIGH (ref 65–99)
Potassium: 4.2 mmol/L (ref 3.5–5.1)
SODIUM: 138 mmol/L (ref 135–145)
TOTAL PROTEIN: 7.4 g/dL (ref 6.5–8.1)

## 2015-11-08 LAB — BLOOD GAS, ARTERIAL
Acid-Base Excess: 2.8 mmol/L (ref 0.0–3.0)
Bicarbonate: 28.8 mEq/L — ABNORMAL HIGH (ref 21.0–28.0)
DELIVERY SYSTEMS: POSITIVE
EXPIRATORY PAP: 5
FIO2: 36
INSPIRATORY PAP: 16
Mechanical Rate: 10
O2 Saturation: 84.1 %
PCO2 ART: 51 mmHg — AB (ref 32.0–48.0)
Patient temperature: 37
pH, Arterial: 7.36 (ref 7.350–7.450)
pO2, Arterial: 51 mmHg — ABNORMAL LOW (ref 83.0–108.0)

## 2015-11-08 LAB — CBC WITH DIFFERENTIAL/PLATELET
Basophils Absolute: 0 10*3/uL (ref 0–0.1)
Basophils Relative: 0 %
EOS ABS: 0.3 10*3/uL (ref 0–0.7)
EOS PCT: 5 %
HCT: 27.5 % — ABNORMAL LOW (ref 35.0–47.0)
Hemoglobin: 9 g/dL — ABNORMAL LOW (ref 12.0–16.0)
LYMPHS ABS: 0.9 10*3/uL — AB (ref 1.0–3.6)
Lymphocytes Relative: 17 %
MCH: 23.2 pg — AB (ref 26.0–34.0)
MCHC: 32.9 g/dL (ref 32.0–36.0)
MCV: 70.4 fL — ABNORMAL LOW (ref 80.0–100.0)
MONO ABS: 0.5 10*3/uL (ref 0.2–0.9)
Monocytes Relative: 10 %
Neutro Abs: 3.8 10*3/uL (ref 1.4–6.5)
Neutrophils Relative %: 68 %
PLATELETS: 180 10*3/uL (ref 150–440)
RBC: 3.9 MIL/uL (ref 3.80–5.20)
RDW: 19.7 % — AB (ref 11.5–14.5)
WBC: 5.6 10*3/uL (ref 3.6–11.0)

## 2015-11-08 LAB — MRSA PCR SCREENING: MRSA BY PCR: NEGATIVE

## 2015-11-08 LAB — MAGNESIUM: Magnesium: 1.7 mg/dL (ref 1.7–2.4)

## 2015-11-08 LAB — TROPONIN I: Troponin I: <0.03 ng/mL (ref <0.03)

## 2015-11-08 LAB — PHOSPHORUS: PHOSPHORUS: 2.6 mg/dL (ref 2.5–4.6)

## 2015-11-08 MED ORDER — IBUPROFEN 600 MG PO TABS
600.0000 mg | ORAL_TABLET | Freq: Once | ORAL | Status: AC
  Administered 2015-11-08: 600 mg via ORAL
  Filled 2015-11-08: qty 1

## 2015-11-08 MED ORDER — ENOXAPARIN SODIUM 40 MG/0.4ML ~~LOC~~ SOLN
40.0000 mg | SUBCUTANEOUS | Status: DC
  Administered 2015-11-08 – 2015-11-09 (×2): 40 mg via SUBCUTANEOUS
  Filled 2015-11-08 (×2): qty 0.4

## 2015-11-08 MED ORDER — ACETAMINOPHEN 325 MG PO TABS
650.0000 mg | ORAL_TABLET | ORAL | Status: DC | PRN
  Administered 2015-11-09: 650 mg via ORAL
  Filled 2015-11-08: qty 2

## 2015-11-08 MED ORDER — BUDESONIDE 0.25 MG/2ML IN SUSP
0.2500 mg | Freq: Two times a day (BID) | RESPIRATORY_TRACT | Status: AC
  Administered 2015-11-08 – 2015-11-09 (×4): 0.25 mg via RESPIRATORY_TRACT
  Filled 2015-11-08 (×4): qty 2

## 2015-11-08 MED ORDER — ALBUTEROL SULFATE (2.5 MG/3ML) 0.083% IN NEBU: 2.5000 mg | INHALATION_SOLUTION | Freq: Four times a day (QID) | RESPIRATORY_TRACT | Status: DC | PRN

## 2015-11-08 MED ORDER — IPRATROPIUM-ALBUTEROL 0.5-2.5 (3) MG/3ML IN SOLN: 3.0000 mL | RESPIRATORY_TRACT | Status: DC | PRN

## 2015-11-08 MED ORDER — ASPIRIN 81 MG PO CHEW
324.0000 mg | CHEWABLE_TABLET | ORAL | Status: AC
  Administered 2015-11-08: 324 mg via ORAL
  Filled 2015-11-08: qty 4

## 2015-11-08 MED ORDER — ALBUTEROL SULFATE (2.5 MG/3ML) 0.083% IN NEBU
2.5000 mg | INHALATION_SOLUTION | Freq: Four times a day (QID) | RESPIRATORY_TRACT | Status: DC
Start: 2015-11-08 — End: 2015-11-08
  Administered 2015-11-08: 2.5 mg via RESPIRATORY_TRACT

## 2015-11-08 MED ORDER — IPRATROPIUM-ALBUTEROL 0.5-2.5 (3) MG/3ML IN SOLN: 3.0000 mL | Freq: Four times a day (QID) | RESPIRATORY_TRACT | Status: DC

## 2015-11-08 MED ORDER — BUDESONIDE 0.25 MG/2ML IN SUSP: 0.2500 mg | Freq: Three times a day (TID) | RESPIRATORY_TRACT | Status: DC

## 2015-11-08 MED ORDER — ENOXAPARIN SODIUM 40 MG/0.4ML ~~LOC~~ SOLN: 40.0000 mg | SUBCUTANEOUS | Status: DC

## 2015-11-08 MED ORDER — IPRATROPIUM-ALBUTEROL 0.5-2.5 (3) MG/3ML IN SOLN
3.0000 mL | RESPIRATORY_TRACT | Status: DC
Start: 2015-11-08 — End: 2015-11-08

## 2015-11-08 MED ORDER — DIPHENHYDRAMINE HCL 50 MG/ML IJ SOLN: 25.0000 mg | Freq: Once | INTRAMUSCULAR | Status: DC

## 2015-11-08 MED ORDER — ALBUTEROL SULFATE (2.5 MG/3ML) 0.083% IN NEBU
2.5000 mg | INHALATION_SOLUTION | Freq: Once | RESPIRATORY_TRACT | Status: AC
  Administered 2015-11-08: 2.5 mg via RESPIRATORY_TRACT
  Filled 2015-11-08: qty 3

## 2015-11-08 MED ORDER — ASPIRIN 300 MG RE SUPP: 300.0000 mg | RECTAL | Status: AC

## 2015-11-08 MED ORDER — SODIUM CHLORIDE 0.9 % IV BOLUS (SEPSIS)
500.0000 mL | Freq: Once | INTRAVENOUS | Status: AC
  Administered 2015-11-08: 500 mL via INTRAVENOUS

## 2015-11-08 MED ORDER — MOMETASONE FURO-FORMOTEROL FUM 200-5 MCG/ACT IN AERO
2.0000 | INHALATION_SPRAY | Freq: Two times a day (BID) | RESPIRATORY_TRACT | Status: DC
  Administered 2015-11-08 – 2015-11-10 (×5): 2 via RESPIRATORY_TRACT
  Filled 2015-11-08: qty 8.8

## 2015-11-08 MED ORDER — METHYLPREDNISOLONE SODIUM SUCC 125 MG IJ SOLR
125.0000 mg | Freq: Once | INTRAMUSCULAR | Status: AC
  Administered 2015-11-08: 125 mg via INTRAVENOUS
  Filled 2015-11-08: qty 2

## 2015-11-08 MED ORDER — IPRATROPIUM-ALBUTEROL 0.5-2.5 (3) MG/3ML IN SOLN
3.0000 mL | Freq: Four times a day (QID) | RESPIRATORY_TRACT | Status: DC
Start: 2015-11-08 — End: 2015-11-08

## 2015-11-08 MED ORDER — DIPHENHYDRAMINE HCL 25 MG PO CAPS
25.0000 mg | ORAL_CAPSULE | Freq: Once | ORAL | Status: AC
  Administered 2015-11-08: 25 mg via ORAL
  Filled 2015-11-08: qty 1

## 2015-11-08 MED ORDER — LEVOFLOXACIN IN D5W 750 MG/150ML IV SOLN
750.0000 mg | Freq: Once | INTRAVENOUS | Status: AC
  Administered 2015-11-08: 750 mg via INTRAVENOUS
  Filled 2015-11-08: qty 150

## 2015-11-08 MED ORDER — ALPRAZOLAM 0.5 MG PO TABS
0.5000 mg | ORAL_TABLET | Freq: Every day | ORAL | Status: DC
  Administered 2015-11-08 – 2015-11-09 (×2): 0.5 mg via ORAL
  Filled 2015-11-08 (×2): qty 1

## 2015-11-08 MED ORDER — SODIUM CHLORIDE 0.9 % IV SOLN: 250.0000 mL | INTRAVENOUS | Status: DC | PRN

## 2015-11-08 MED ORDER — ALBUTEROL SULFATE (2.5 MG/3ML) 0.083% IN NEBU
2.5000 mg | INHALATION_SOLUTION | RESPIRATORY_TRACT | Status: DC
  Administered 2015-11-08 – 2015-11-10 (×11): 2.5 mg via RESPIRATORY_TRACT
  Filled 2015-11-08 (×11): qty 3

## 2015-11-08 MED ORDER — DEXTROSE 5 % IV SOLN
500.0000 mg | INTRAVENOUS | Status: DC
  Administered 2015-11-08 – 2015-11-09 (×2): 500 mg via INTRAVENOUS
  Filled 2015-11-08 (×3): qty 500

## 2015-11-08 MED ORDER — PREDNISONE 20 MG PO TABS
40.0000 mg | ORAL_TABLET | Freq: Every day | ORAL | Status: DC
Start: 2015-11-09 — End: 2015-11-10
  Administered 2015-11-09 – 2015-11-10 (×2): 40 mg via ORAL
  Filled 2015-11-08 (×2): qty 2

## 2015-11-08 MED ORDER — ALBUTEROL SULFATE (2.5 MG/3ML) 0.083% IN NEBU
INHALATION_SOLUTION | RESPIRATORY_TRACT | Status: AC
  Administered 2015-11-08: 2.5 mg via RESPIRATORY_TRACT
  Filled 2015-11-08: qty 3

## 2015-11-08 MED ORDER — OXYCODONE-ACETAMINOPHEN 5-325 MG PO TABS
1.0000 | ORAL_TABLET | Freq: Four times a day (QID) | ORAL | Status: DC | PRN
  Administered 2015-11-08 – 2015-11-09 (×4): 1 via ORAL
  Filled 2015-11-08 (×4): qty 1

## 2015-11-08 MED ORDER — PANTOPRAZOLE SODIUM 40 MG PO TBEC
40.0000 mg | DELAYED_RELEASE_TABLET | Freq: Every day | ORAL | Status: DC
  Administered 2015-11-08 – 2015-11-10 (×3): 40 mg via ORAL
  Filled 2015-11-08 (×3): qty 1

## 2015-11-08 NOTE — H&P (Signed)
PULMONARY / CRITICAL CARE MEDICINE   Name: Julia [name] MRN: [id number] DOB: 1961/02/01    ADMISSION DATE:  11/08/2015 CONSULTATION DATE:  11/08/15  REFERRING MD:  Dr. Inocencio Homes  CHIEF COMPLAINT:  Acute Respiratory Failure  HISTORY OF PRESENT ILLNESS:   This is a 55 yo female with PMH of current 1/2 pack daily smoker (42 yr hx), COPD (3L O2 qhs), and Asthma. She presented to [facility] on 7/8 with c/o worsening shortness of breath onset 7/6 requiring outpatient breathing treatments q4hrs with minimal improvement of symptoms.  She uses home O2 3L via nasal canula qhs at home.  PCCM admitting to stepdown unit due to acute respiratory failure secondary to AECOPD.  PAST MEDICAL HISTORY :  She  has a past medical history of COPD (chronic obstructive pulmonary disease) (HCC) and Asthma.  PAST SURGICAL HISTORY: She  has past surgical history that includes Hernia repair; Ear tube removal; Fracture surgery; and Tubal ligation.  Allergies  Allergen Reactions  . Atrovent [Ipratropium] Other (See Comments) and Shortness Of Breath    Unknown     No current facility-administered medications on file prior to encounter.   Current Outpatient Prescriptions on File Prior to Encounter  Medication Sig  . albuterol (PROVENTIL HFA;VENTOLIN HFA) 108 (90 BASE) MCG/ACT inhaler Inhale 2 puffs into the lungs every 6 (six) hours as needed for wheezing or shortness of breath.  . ALPRAZolam (XANAX) 0.5 MG tablet Take 0.5 mg by mouth at bedtime.  . docusate sodium (COLACE) 100 MG capsule Take 100 mg by mouth daily as needed for mild constipation.  . pantoprazole (PROTONIX) 40 MG tablet Take 40 mg by mouth daily.  . Potassium 99 MG TABS Take 1 tablet by mouth daily.  . sertraline (ZOLOFT) 100 MG tablet Take 100 mg by mouth daily.  Marland Kitchen azithromycin (ZITHROMAX) 250 MG tablet Take 1 tablet (250 mg total) by mouth daily.  . benzonatate (TESSALON) 100 MG capsule Take 1 capsule (100 mg total) by mouth 3 (three) times daily as  needed for cough.  . nicotine (NICODERM CQ - DOSED IN MG/24 HOURS) 21 mg/24hr patch Place 1 patch (21 mg total) onto the skin daily.  . predniSONE (DELTASONE) 10 MG tablet Take 1 tablet (10 mg total) by mouth daily with breakfast.    FAMILY HISTORY:  Her has no family status information on file.   SOCIAL HISTORY: She  reports that she has been smoking Cigarettes.  She has been smoking about 0.50 packs per day. She does not have any smokeless tobacco history on file. She reports that she does not drink alcohol or use illicit drugs.  REVIEW OF SYSTEMS:   Gen: Denies fever, chills, weight change, fatigue, night sweats HEENT: Denies blurred vision, double vision, hearing loss, tinnitus, sinus congestion, rhinorrhea, sore throat, neck stiffness, dysphagia PULM: Positive shortness of breath, cough, sputum production, hemoptysis, wheezing CV: Denies chest pain, edema, orthopnea, paroxysmal nocturnal dyspnea, palpitations GI: Denies abdominal pain, nausea, vomiting, diarrhea, hematochezia, melena, constipation, change in bowel habits GU: Denies dysuria, hematuria, polyuria, oliguria, urethral discharge Endocrine: Denies hot or cold intolerance, polyuria, polyphagia or appetite change Derm: Denies rash, dry skin, scaling or peeling skin change Heme: Denies easy bruising, bleeding, bleeding gums Neuro: Denies headache, numbness, weakness, slurred speech, loss of memory or consciousness  SUBJECTIVE:  Pt currently on 36% bipap no acute distress.  States breathing has improved since on bipap.  VITAL SIGNS: BP 144/91 mmHg  Pulse 65  Temp(Src) 98 F (36.7 C) (Oral)  Resp  18  Ht 5\' 5"  (1.651 m)  Wt 143 lb (64.864 kg)  BMI 23.80 kg/m2  SpO2 100%  HEMODYNAMICS:    VENTILATOR SETTINGS: Vent Mode:  [-]  FiO2 (%):  [36 %] 36 %  INTAKE / OUTPUT:    PHYSICAL EXAMINATION: General:  Chronically ill appearing female Neuro:  Alert and oriented, follows commands HEENT:  Supple, No  JVD Cardiovascular:  s1s2, rrr, no M/R/G Lungs:  Diffuse expiratory wheezes throughout, even, non labored Abdomen:  +BSx4, soft, non tender, non distended Musculoskeletal:  Normal bulk and tone Skin:  Intact, no rash or lesions  LABS:  BMET  Recent Labs Lab 11/08/15 0920  NA 138  K 4.2  CL 102  CO2 28  BUN 20  CREATININE 0.80  GLUCOSE 123*    Electrolytes  Recent Labs Lab 11/08/15 0920  CALCIUM 9.6    CBC  Recent Labs Lab 11/08/15 0920  WBC 5.6  HGB 9.0*  HCT 27.5*  PLT 180    Coag's No results for input(s): APTT, INR in the last 168 hours.  Sepsis Markers No results for input(s): LATICACIDVEN, PROCALCITON, O2SATVEN in the last 168 hours.  ABG No results for input(s): PHART, PCO2ART, PO2ART in the last 168 hours.  Liver Enzymes  Recent Labs Lab 11/08/15 0920  AST 26  ALT 18  ALKPHOS 66  BILITOT 0.3  ALBUMIN 4.0    Cardiac Enzymes  Recent Labs Lab 11/08/15 0920  TROPONINI <0.03    Glucose No results for input(s): GLUCAP in the last 168 hours.  Imaging Dg Chest 2 View  11/08/2015  CLINICAL DATA:  Shortness of breath EXAM: CHEST  2 VIEW COMPARISON:  04/07/2015 chest radiograph. FINDINGS: Stable cardiomediastinal silhouette with top-normal heart size and small hiatal hernia. No pneumothorax. No pleural effusion. No overt pulmonary edema. No acute consolidative airspace disease. Stable chronic mild lower thoracic vertebral compression fracture. Mild thoracic spondylosis. IMPRESSION: 1. No active pulmonary disease. 2. Small hiatal hernia. Electronically Signed   By: Delbert PhenixJason A Poff M.D.   On: 11/08/2015 10:14    STUDIES:  None  CULTURES: Blood 7/8>>  ANTIBIOTICS: Levaquin 7/8>>once Azithromycin 7/8>>  SIGNIFICANT EVENTS: -[facility] respiratory failure secondary to AECOPD requiring Bipap -PCCM admitted to stepdown unit  LINES/TUBES: PIV  DISCUSSION: This is a 55 yo female who [facility] with  c/o worsening shortness of breath secondary to AECOPD requiring bipap  ASSESSMENT / PLAN:  PULMONARY A: AECOPD Hx: Asthma-unlikely current exacerbation P:   Will attempt to wean off Bipap today 7/8 Start Duonebs, [place], and Pulmicort Start Prednisone 7/9 taper over 10 days Supplemental O2 88% to 94% ABG today Intermittent CXR Pulmonary Hygiene Smoking Cessation Education  CARDIOVASCULAR A:  No acute issues P:  Trend Troponin's Telemetry monitoring  RENAL A:   No acute issues P:   Trend BMP's Monitor UOP Replace electrolytes as indicated   GASTROINTESTINAL A:   No acute issues P:   Regular Diet Start protonix  HEMATOLOGIC A:   Anemia P:  Lovenox for VTE prophylaxis Monitor for s/sx of bleeding Transfuse for HgB <7  INFECTIOUS A:   No Leukocytosis P:   Trend WBC and monitor fever curve Continue abx listed above  ENDOCRINE A:   No acute issues   P:   Monitor serum glucose  NEUROLOGIC A:   Hx: Anxiety P:   RASS goal: N/A Start outpatient xanax   FAMILY  - Updates: Pt and pts husband updated about  plan of care and questions answered   - Inter-disciplinary family meet or Palliative Care meeting due by: 11/15/2015   Sonda Rumble, AGNP  Pulmonary/Critical Care Pager [phone] (please enter 7 digits) PCCM Consult Pager [phone] (please enter 7 digits)   STAFF NOTE: I, Dr. Stephanie Acre have personally reviewed patient's available data, including medical history, events of note, physical examination and test results as part of my evaluation. I have discussed with resident/NP NP Blakeney and other care providers such as pharmacist, RN and RRT.  In addition,  I personally evaluated patient and elicited key findings of   HPI:  55 year old female past medical history of Asthma/COPD, tobacco abuse, back surgeries, admitted by CCM for acute respiratory failure requiring BiPAP. History per patient and her husband. Patient has a known history  of COPD on 2-3 L at night and with exertion. Over the last 4 days she's been having increased shortness of breath and using her albuterol more often. She also developed a cough nonproductive in the last 1-2 days, and some wheezing. She endorses shortness of breath, wheezing, cough, intermittent chills. In the ER she saturating 100% on BiPAP, she was placed on BiPAP for comfort ED staff, not due to desaturations. Patient husband believes the trigger for her COPD at this time is rapid weather changes, she usually has difficulty with breathing when he gets hot. Patient is still smoking half pack per day  O:  GEN-NAD, on bipap, fully awake and alert HEENT-PERRLA, [place], AT, no lesions CVS-s1, s2, no murmurs LUNGS-diffuse wheezes, slow airway movement, down to the bases.  ABD-soft, nt, nd, +BS MSK-no lesions, no edmea   Recent Labs CBC Latest Ref Rng 11/08/2015 04/08/2015 04/07/2015  WBC 3.6 - 11.0 K/uL 5.6 5.7 7.4  Hemoglobin 12.0 - 16.0 g/dL 9.0(L) 8.3(L) 8.6(L)  Hematocrit 35.0 - 47.0 % 27.5(L) 26.6(L) 25.7(L)  Platelets 150 - 440 K/uL 180 214 202      Recent Labs BMP Latest Ref Rng 11/08/2015 04/08/2015 04/07/2015  Glucose 65 - 99 mg/dL 295(A) 213(Y) 865(H)  BUN 6 - 20 mg/dL 20 20 16   Creatinine 0.44 - 1.00 mg/dL 8.46 9.62 9.52  Sodium 135 - 145 mmol/L 138 135 138  Potassium 3.5 - 5.1 mmol/L 4.2 4.8 4.3  Chloride 101 - 111 mmol/L 102 98(L) 103  CO2 22 - 32 mmol/L 28 30 29   Calcium 8.9 - 10.3 mg/dL 9.6 9.3 9.2       (The following images and results were reviewed by Dr. Dema Severin on 11/08/2015). Dg Chest 2 View  11/08/2015  CLINICAL DATA:  Shortness of breath EXAM: CHEST  2 VIEW COMPARISON:  04/07/2015 chest radiograph. FINDINGS: Stable cardiomediastinal silhouette with top-normal heart size and small hiatal hernia. No pneumothorax. No pleural effusion. No overt pulmonary edema. No acute consolidative airspace disease. Stable chronic mild lower thoracic vertebral compression fracture. Mild  thoracic spondylosis. IMPRESSION: 1. No active pulmonary disease. 2. Small hiatal hernia. Electronically Signed   By: Delbert Phenix M.D.   On: 11/08/2015 10:10      A:55 year old female past medical history of tobacco abuse, Asthma/COPD on 2-3 liters at night and with exertion, back surgeries, admitted for COPD exacerbation.   acute on chronic respiratory failure-hypercapnic COPD exacerbation Tobacco abuse Dyspnea  P:   -Patient with moderate to significant diffuse wheezing throughout all lung fields, she is currently on BiPAP and not hypoxic. Will wean patient off BiPAP -Supplemental O2 to maintain a saturation 88-92 percent -Schedule DuoNeb's every 4 hours, Pulmicort nebs 2 times  a day; Both of these for 48 hours total. -Prednisone 30 mg daily 5 days -Azithromycin 5 days total -Tobacco counseling cessation given to the patient -Patient is more of a COPD type pathophysiology at this point than asthmatic. She'll benefit more from a inhale corticosteroid combined with a long-acting beta agonists or a long-acting anticholinergic.  Start Dulera during this admission.    Marland Kitchen  Rest per NP/medical resident whose note is outlined above and that I agree with  The patient is critically ill with multiple organ systems failure and requires high complexity decision making for assessment and support, frequent evaluation and titration of therapies, application of advanced monitoring technologies and extensive interpretation of multiple databases.   Critical Care Time devoted to patient care services described in this note is  45 Minutes.   This time reflects time of care of this signee Dr Stephanie Acre.  This critical care time does not reflect procedure time, or teaching time or supervisory time of PA/NP/Med-student/Med Resident etc but could involve care discussion time.  Stephanie Acre, MD [facility] Pager [phone] (please enter 7-digits) On Call Pager [phone] (please enter 7-digits)  Note: This note was prepared with Dragon dictation along with smaller phrase technology. Any transcriptional errors that result from this process are unintentional.

## 2015-11-08 NOTE — Progress Notes (Signed)
Pt care assumed at approx 1800.  Pt resting in bed with NAD noted. At 1844 pt had greater than 20 beats of Vtach.  Pt asymptomatic on my arrival to room.  Denies chest pain, SOB, or fluttering feeling.  Pt awake, alert and oriented.  NAD noted.  Julia Harmanana NP aware, writing orders at this time.

## 2015-11-08 NOTE — ED Provider Notes (Signed)
[facility] Emergency Department Provider Note   ____________________________________________  Time seen: Approximately 9:54 AM  I have reviewed the triage vital signs and the nursing notes.   HISTORY  Chief Complaint Shortness of Breath    HPI Julia [name] is a 55 y.o. female with history of COPD with chronic 2.5 L home oxygen requirement who presents for evaluation one week of worsening shortness of breath with wheezing, gradual onset, constant, worse with exertion, only mildly improved with her nebulous to treatments. She has had cough and runny nose. She has intermittent mild pain worse with cough. No vomiting or diarrhea. No fevers or chills.    Past Medical History  Diagnosis Date  . COPD (chronic obstructive pulmonary disease) (HCC)   . Asthma     Patient Active Problem List   Diagnosis Date Noted  . Acute respiratory failure (HCC) 11/08/2015  . COPD exacerbation (HCC) 04/07/2015    Past Surgical History  Procedure Laterality Date  . Hernia repair    . Ear tube removal    . Fracture surgery    . Tubal ligation      Current Outpatient Rx  Name  Route  Sig  Dispense  Refill  . albuterol (PROVENTIL HFA;VENTOLIN HFA) 108 (90 BASE) MCG/ACT inhaler   Inhalation   Inhale 2 puffs into the lungs every 6 (six) hours as needed for wheezing or shortness of breath.   1 Inhaler   2   . Aloe Vera 470 MG CAPS   Oral   Take 470 mg by mouth daily.         Marland Kitchen ALPRAZolam (XANAX) 0.5 MG tablet   Oral   Take 0.5 mg by mouth at bedtime.         . beclomethasone (QVAR) 80 MCG/ACT inhaler   Inhalation   Inhale 2 puffs into the lungs 2 (two) times daily.         Marland Kitchen docusate sodium (COLACE) 100 MG capsule   Oral   Take 100 mg by mouth daily as needed for mild constipation.         . magnesium oxide (MAG-OX) 400 MG tablet   Oral   Take 400 mg by mouth daily.         . Multiple Vitamins-Minerals (CENTRUM SILVER PO)   Oral   Take  1 tablet by mouth daily.         . pantoprazole (PROTONIX) 40 MG tablet   Oral   Take 40 mg by mouth daily.         . Potassium 99 MG TABS   Oral   Take 1 tablet by mouth daily.         . sertraline (ZOLOFT) 100 MG tablet   Oral   Take 100 mg by mouth daily.         Marland Kitchen azithromycin (ZITHROMAX) 250 MG tablet   Oral   Take 1 tablet (250 mg total) by mouth daily.   6 each   0   . benzonatate (TESSALON) 100 MG capsule   Oral   Take 1 capsule (100 mg total) by mouth 3 (three) times daily as needed for cough.   20 capsule   0   . nicotine (NICODERM CQ - DOSED IN MG/24 HOURS) 21 mg/24hr patch   Transdermal   Place 1 patch (21 mg total) onto the skin daily.   28 patch   0   . predniSONE (DELTASONE) 10 MG tablet   Oral  Take 1 tablet (10 mg total) by mouth daily with breakfast.   20 tablet   0     Label  & dispense according to the schedule below: ...     Allergies Atrovent  No family history on file.  Social History Social History  Substance Use Topics  . Smoking status: Current Every Day Smoker -- 0.50 packs/day    Types: Cigarettes  . Smokeless tobacco: None  . Alcohol Use: No    Review of Systems Constitutional: No fever/chills Eyes: No visual changes. ENT: No sore throat. Cardiovascular: +chest pain. Respiratory: +shortness of breath. Gastrointestinal: No abdominal pain.  No nausea, no vomiting.  No diarrhea.  No constipation. Genitourinary: Negative for dysuria. Musculoskeletal: Negative for back pain. Skin: Negative for rash. Neurological: Negative for headaches, focal weakness or numbness.  10-point ROS otherwise negative.  ____________________________________________   PHYSICAL EXAM:  VITAL SIGNS: ED Triage Vitals  Enc Vitals Group     BP 11/08/15 0925 89/49 mmHg     Pulse Rate 11/08/15 0925 88     Resp 11/08/15 0925 24     Temp 11/08/15 0925 98 F (36.7 C)     Temp Source 11/08/15 0925 Oral     SpO2 11/08/15 0925 92 %      Weight 11/08/15 0925 143 lb (64.864 kg)     Height 11/08/15 0925 5\' 5"  (1.651 m)     Head Cir --      Peak Flow --      Pain Score 11/08/15 0926 8     Pain Loc --      Pain Edu? --      Excl. in GC? --     Constitutional: Alert and oriented. In mild to moderate respiratory distress. Eyes: Conjunctivae are normal. PERRL. EOMI. Head: Atraumatic. Nose: No congestion/rhinnorhea. Mouth/Throat: Mucous membranes are moist.  Oropharynx non-erythematous. Neck: No stridor. Supple without meningismus. Cardiovascular: Normal rate, regular rhythm. Grossly normal heart sounds.  Good peripheral circulation. Respiratory: Increased work of breathing, tachypnea, diffuse respiratory wheezes with poor air movement. Gastrointestinal: Soft and nontender. No distention. No CVA tenderness. Genitourinary: deferred Musculoskeletal: No lower extremity tenderness nor edema.  No joint effusions. Neurologic:  Normal speech and language. No gross focal neurologic deficits are appreciated.  Skin:  Skin is warm, dry and intact. No rash noted. Psychiatric: Mood and affect are normal. Speech and behavior are normal.  ____________________________________________   LABS (all labs ordered are listed, but only abnormal results are displayed)  Labs Reviewed  CBC WITH DIFFERENTIAL/PLATELET - Abnormal; Notable for the following:    Hemoglobin 9.0 (*)    HCT 27.5 (*)    MCV 70.4 (*)    MCH 23.2 (*)    RDW 19.7 (*)    Lymphs Abs 0.9 (*)    All other components within normal limits  COMPREHENSIVE METABOLIC PANEL - Abnormal; Notable for the following:    Glucose, Bld 123 (*)    All other components within normal limits  CULTURE, BLOOD (ROUTINE X 2)  CULTURE, BLOOD (ROUTINE X 2)  TROPONIN I  BLOOD GAS, ARTERIAL   ____________________________________________  EKG  ED ECG REPORT I, Gayla DossGayle, Tela Kotecki A, the attending physician, personally viewed and interpreted this ECG.   Date: 11/08/2015  EKG Time: 09:37  Rate:  76  Rhythm: normal sinus rhythm  Axis: right  Intervals:none  ST&T Change: No acute ST elevation or acute ST depression. Q waves in V1 and V2.  ____________________________________________  RADIOLOGY  CXR IMPRESSION: 1. No  active pulmonary disease. 2. Small hiatal hernia. ____________________________________________   PROCEDURES  Procedure(s) performed: None  Procedures  Critical Care performed: Yes, see critical care note(s).  CRITICAL CARE Performed by: Toney Rakes A   Total critical care time: 35 minutes  Critical care time was exclusive of separately billable procedures and treating other patients.  Critical care was necessary to treat or prevent imminent or life-threatening deterioration.  Critical care was time spent personally by me on the following activities: development of treatment plan with patient and/or surrogate as well as nursing, discussions with consultants, evaluation of patient's response to treatment, examination of patient, obtaining history from patient or surrogate, ordering and performing treatments and interventions, ordering and review of laboratory studies, ordering and review of radiographic studies, pulse oximetry and re-evaluation of patient's condition. ____________________________________________   INITIAL IMPRESSION / ASSESSMENT AND PLAN / ED COURSE  Pertinent labs & imaging results that were available during my care of the patient were reviewed by me and considered in my medical decision making (see chart for details).  Julia [name] is a 55 y.o. female with history of COPD with chronic 2.5 L home oxygen requirement who presents for evaluation one week of worsening shortness of breath with wheezing. On exam, she is in mild to moderate respiratory distress with expiratory wheezes and poor air movement consistent with COPD exacerbation. Her oxygen saturation is normal on her home oxygen requirement however given her increased work of  breathing, will start BiPAP, give albuterol, steroids, obtain screening labs and chest x-ray. Anticipate admission.   ----------------------------------------- 11:50 AM on 11/08/2015 -----------------------------------------  Patient with improvement work of breathing on BiPAP. Labs showed mild stable anemia, unremarkable cmp, negative troponin, chest x-ray shows no acute cardio pulmonary disease. Dr. Dema Severin of the ICU will admit. ____________________________________________   FINAL CLINICAL IMPRESSION(S) / ED DIAGNOSES  Final diagnoses:  COPD exacerbation (HCC)      NEW MEDICATIONS STARTED DURING THIS VISIT:  New Prescriptions   No medications on file     Note:  This document was prepared using Dragon voice recognition software and may include unintentional dictation errors.    Gayla Doss, MD 11/08/15 1225

## 2015-11-08 NOTE — ED Notes (Signed)
Pt removed from BiPAP and placed on 4L n/c. O2 sat WNL, pt still has some increased WOB but it is improved after rest on BiPAP.

## 2015-11-08 NOTE — ED Notes (Signed)
Pt c/o increased SOB/asthma flare for the past week.. States she is on 2.5L [place] at home.. States neb tx are not helping..Marland Kitchen

## 2015-11-08 NOTE — Progress Notes (Signed)
   11/08/15 1700  Clinical Encounter Type  Visited With Patient  Visit Type (Advance Directive)  Referral From Nurse  Consult/Referral To [place] (For Healthcare)  Does patient have an advance directive? Yes  Type of Paramedic of [place];Living will  Copy of advanced directive(s) in chart? No - copy requested  met with pt.  Made 3 copies of the AD. Gave one to the Admin for file.

## 2015-11-08 NOTE — Progress Notes (Signed)
Pt had greater than 20 beat run vfibb/vtach,asymptomatic resolved without intervention will check stat ekg, magnesium,  phosphorous replace electrolytes if indicated.  Sonda Rumbleana Ovie Cornelio, AGNP  Pulmonary/Critical Care Pager [phone] (please enter 7 digits) PCCM Consult Pager [phone] (please enter 7 digits)

## 2015-11-08 NOTE — Consult Note (Signed)
MEDICATION RELATED CONSULT NOTE - INITIAL   Pharmacy Consult for electrolyte management Indication: 20 beat run of vfibb/vtach  Allergies  Allergen Reactions  . Atrovent [Ipratropium] Other (See Comments) and Shortness Of Breath    Unknown     Patient Measurements: Height: 5\' 5"  (165.1 cm) Weight: 152 lb 5.4 oz (69.1 kg) IBW/kg (Calculated) : 57 Adjusted Body Weight:   Vital Signs: Temp: 98 F (36.7 C) (07/08 1700) Temp Source: Oral (07/08 1700) BP: 115/63 mmHg (07/08 1700) Pulse Rate: 62 (07/08 1700) Intake/Output from previous day:   Intake/Output from this shift:    Labs:  Recent Labs  11/08/15 0920 11/08/15 1852  WBC 5.6  --   HGB 9.0*  --   HCT 27.5*  --   PLT 180  --   CREATININE 0.80  --   MG  --  1.7  PHOS  --  2.6  ALBUMIN 4.0  --   PROT 7.4  --   AST 26  --   ALT 18  --   ALKPHOS 66  --   BILITOT 0.3  --    Estimated Creatinine Clearance: 77.5 mL/min (by C-G formula based on Cr of 0.8).   Microbiology: Recent Results (from the past 720 hour(s))  MRSA PCR Screening     Status: None   Collection Time: 11/08/15  1:20 PM  Result Value Ref Range Status   MRSA by PCR NEGATIVE NEGATIVE Final    Comment:        The GeneXpert MRSA Assay (FDA approved for NASAL specimens only), is one component of a comprehensive MRSA colonization surveillance program. It is not intended to diagnose MRSA infection nor to guide or monitor treatment for MRSA infections.     Medical History: Past Medical History  Diagnosis Date  . COPD (chronic obstructive pulmonary disease) (HCC)   . Asthma     Medications:  Scheduled:  . albuterol  2.5 mg Nebulization Q4H  . ALPRAZolam  0.5 mg Oral QHS  . azithromycin  500 mg Intravenous Q24H  . budesonide (PULMICORT) nebulizer solution  0.25 mg Nebulization BID  . enoxaparin (LOVENOX) injection  40 mg Subcutaneous Q24H  . mometasone-formoterol  2 puff Inhalation BID  . pantoprazole  40 mg Oral Q1200  . [START ON  11/09/2015] predniSONE  40 mg Oral Q breakfast    Assessment: Pt had a 20 beat run of vfibb/vtach. Mg, phos, K level checked. All are WNL.  Goal of Therapy:  Normalization of electrolytes  Plan:  No supplementation needed at this time. Will recheck in the AM.  Olene FlossMelissa D Maanav Kassabian 11/08/2015,7:36 PM

## 2015-11-08 NOTE — Progress Notes (Signed)
Pt is in no distress. Bipap on standby. Pt on 2L [place]. Tolerating well.

## 2015-11-09 ENCOUNTER — Inpatient Hospital Stay: Payer: Medicaid Other

## 2015-11-09 DIAGNOSIS — J9601 Acute respiratory failure with hypoxia: Secondary | ICD-10-CM

## 2015-11-09 DIAGNOSIS — J9611 Chronic respiratory failure with hypoxia: Secondary | ICD-10-CM

## 2015-11-09 LAB — CBC
HCT: 28.7 % — ABNORMAL LOW (ref 35.0–47.0)
Hemoglobin: 9.1 g/dL — ABNORMAL LOW (ref 12.0–16.0)
MCH: 23.1 pg — AB (ref 26.0–34.0)
MCHC: 31.7 g/dL — ABNORMAL LOW (ref 32.0–36.0)
MCV: 72.8 fL — AB (ref 80.0–100.0)
PLATELETS: 188 10*3/uL (ref 150–440)
RBC: 3.94 MIL/uL (ref 3.80–5.20)
RDW: 20.1 % — AB (ref 11.5–14.5)
WBC: 4.6 10*3/uL (ref 3.6–11.0)

## 2015-11-09 LAB — IRON AND TIBC
IRON: 18 ug/dL — AB (ref 28–170)
Saturation Ratios: 4 % — ABNORMAL LOW (ref 10.4–31.8)
TIBC: 424 ug/dL (ref 250–450)
UIBC: 406 ug/dL

## 2015-11-09 LAB — BASIC METABOLIC PANEL
ANION GAP: 5 (ref 5–15)
BUN: 18 mg/dL (ref 6–20)
CALCIUM: 9.8 mg/dL (ref 8.9–10.3)
CO2: 28 mmol/L (ref 22–32)
CREATININE: 0.77 mg/dL (ref 0.44–1.00)
Chloride: 106 mmol/L (ref 101–111)
GFR calc Af Amer: >60 mL/min (ref >60)
GLUCOSE: 117 mg/dL — AB (ref 65–99)
Potassium: 4.1 mmol/L (ref 3.5–5.1)
Sodium: 139 mmol/L (ref 135–145)

## 2015-11-09 LAB — BLOOD GAS, ARTERIAL
Acid-Base Excess: 1.5 mmol/L (ref 0.0–3.0)
BICARBONATE: 27.6 meq/L (ref 21.0–28.0)
FIO2: 0.28
O2 Saturation: 99.2 %
PCO2 ART: 50 mmHg — AB (ref 32.0–48.0)
PH ART: 7.35 (ref 7.350–7.450)
PO2 ART: 146 mmHg — AB (ref 83.0–108.0)
Patient temperature: 37

## 2015-11-09 LAB — PHOSPHORUS: Phosphorus: 3.3 mg/dL (ref 2.5–4.6)

## 2015-11-09 LAB — MAGNESIUM: MAGNESIUM: 1.8 mg/dL (ref 1.7–2.4)

## 2015-11-09 MED ORDER — DOCUSATE SODIUM 100 MG PO CAPS
200.0000 mg | ORAL_CAPSULE | Freq: Every day | ORAL | Status: DC | PRN
  Administered 2015-11-09: 200 mg via ORAL
  Filled 2015-11-09: qty 2

## 2015-11-09 MED ORDER — ZOLPIDEM TARTRATE 5 MG PO TABS: 5.0000 mg | ORAL_TABLET | Freq: Every evening | ORAL | Status: DC | PRN

## 2015-11-09 MED ORDER — ONDANSETRON HCL 4 MG/2ML IJ SOLN
4.0000 mg | INTRAMUSCULAR | Status: DC | PRN
  Administered 2015-11-09: 4 mg via INTRAVENOUS
  Filled 2015-11-09: qty 2

## 2015-11-09 MED ORDER — PROMETHAZINE HCL 25 MG/ML IJ SOLN
12.5000 mg | INTRAMUSCULAR | Status: DC | PRN
Start: 2015-11-09 — End: 2015-11-10
  Administered 2015-11-09 – 2015-11-10 (×2): 12.5 mg via INTRAVENOUS
  Filled 2015-11-09 (×2): qty 1

## 2015-11-09 NOTE — Progress Notes (Signed)
PULMONARY / CRITICAL CARE MEDICINE   Name: Julia [name] MRN: [id number] DOB: July 12, 1960    ADMISSION DATE:  11/08/2015 CONSULTATION DATE:  11/08/15  REFERRING MD:  Dr. Inocencio HomesGayle  CHIEF COMPLAINT:  Acute Respiratory Failure  DISCUSSION: This is a 55 yo female who [facility] with c/o worsening shortness of breath secondary to AECOPD requiring bipap  SUBJECTIVE:  Patient was on BiPAP overnight, had an uneventful night.  VITAL SIGNS: BP 130/113 mmHg  Pulse 59  Temp(Src) 97.7 F (36.5 C) (Oral)  Resp 19  Ht 5\' 5"  (1.651 m)  Wt 69.1 kg (152 lb 5.4 oz)  BMI 25.35 kg/m2  SpO2 99%  HEMODYNAMICS:    VENTILATOR SETTINGS: Vent Mode:  [-]  FiO2 (%):  [36 %] 36 %  INTAKE / OUTPUT: I/O last 3 completed shifts: In: 490 [P.O.:240; IV Piggyback:250] Out: -   PHYSICAL EXAMINATION: General:  Caucasian female appears to be in no acute distress. Neuro:  Alert and oriented, follows commands HEENT:  Supple, No JVD Cardiovascular:  s1s2, rrr, no M/R/G Lungs:  Diminished towards bases but otherwise clear, even and non labored Abdomen:  +BSx4, soft, non tender, non distended Musculoskeletal: no inflammation/deformity noted Skin:  Intact, no rash or lesions  LABS:  BMET  Recent Labs Lab 11/08/15 0920  NA 138  K 4.2  CL 102  CO2 28  BUN 20  CREATININE 0.80  GLUCOSE 123*    Electrolytes  Recent Labs Lab 11/08/15 0920 11/08/15 1852  CALCIUM 9.6  --   MG  --  1.7  PHOS  --  2.6    CBC  Recent Labs Lab 11/08/15 0920  WBC 5.6  HGB 9.0*  HCT 27.5*  PLT 180    Coag's No results for input(s): APTT, INR in the last 168 hours.  Sepsis Markers No results for input(s): LATICACIDVEN, PROCALCITON, O2SATVEN in the last 168 hours.  ABG  Recent Labs Lab 11/08/15 1148  PHART 7.36  PCO2ART 51*  PO2ART 51*    Liver Enzymes  Recent Labs Lab 11/08/15 0920  AST 26  ALT 18  ALKPHOS 66  BILITOT 0.3  ALBUMIN 4.0    Cardiac Enzymes  Recent  Labs Lab 11/08/15 0920  TROPONINI <0.03    Glucose No results for input(s): GLUCAP in the last 168 hours.  Imaging Dg Chest 2 View  11/08/2015  CLINICAL DATA:  Shortness of breath EXAM: CHEST  2 VIEW COMPARISON:  04/07/2015 chest radiograph. FINDINGS: Stable cardiomediastinal silhouette with top-normal heart size and small hiatal hernia. No pneumothorax. No pleural effusion. No overt pulmonary edema. No acute consolidative airspace disease. Stable chronic mild lower thoracic vertebral compression fracture. Mild thoracic spondylosis. IMPRESSION: 1. No active pulmonary disease. 2. Small hiatal hernia. Electronically Signed   By: Delbert PhenixJason A Poff M.D.   On: 11/08/2015 10:14    STUDIES:  None  CULTURES: Blood 7/8>>  ANTIBIOTICS: Levaquin 7/8>>once Azithromycin 7/8>>  SIGNIFICANT EVENTS: 7/8>>[facility] due to acute respiratory failure secondary to AECOPD requiring Bipap PCCM admitted to stepdown unit  LINES/TUBES: PIV   ASSESSMENT / PLAN:  PULMONARY A: AECOPD Hx: Asthma-unlikely current exacerbation P:  Support with oxygen to keep sats> 88- 92%  BiPAP at night Start Duonebs, Dulera, and Pulmicort Continue Prednisone taper over 5 days,day#2 Continue Azithromycin for 5 days, day#2 Intermittent CXR Pulmonary Hygiene Smoking Cessation councelling  CARDIOVASCULAR A:  No acute issues P:  Telemetry monitoring  RENAL A:   No acute issues P:  Trend BMP's Monitor UOP Replace electrolytes as indicated   GASTROINTESTINAL A:   No acute issues P:   Regular Diet Start protonix  HEMATOLOGIC A:   Anemia P:  Lovenox for VTE prophylaxis Monitor for s/sx of bleeding Transfuse for HgB <7  INFECTIOUS A:   No Leukocytosis P:   Trend WBC and monitor fever curve Continue Azithromycin for a total of 5 days, day#2  ENDOCRINE A:   No acute issues   P:   Monitor serum glucose  NEUROLOGIC A:   Hx: Anxiety P:   RASS goal: N/A Continue  Alprazolam  Note: Patient transferred to the Hospitalist service on 7/9.  Spoke with Dr. Vivien Rota Pyreddy.   Bincy Varughese,AG-ACNP Pulmonary & Critical Care    STAFF NOTE: I, Dr. Stephanie [name] have personally reviewed patient's available data, including medical history, events of note, physical examination and test results as part of my evaluation. I have discussed with NP Varughese  and other care providers such as pharmacist, RN and RRT.     A:55 year old female past medical history of tobacco abuse, Asthma/COPD on 2-3 liters at night and with exertion, multiple back surgeries, admitted for COPD exacerbation.  acute on chronic respiratory failure-hypercapnic - now improving - off bipap COPD exacerbation Tobacco abuse Dyspnea  P:  -Improved on BiPAP, no longer requiring BiPAP. Incentive spirometry, flutter valve will assist in respirations at this point.  Wearing BiPAP for comfort in this patient is not recommended since she's not having significant hypoxia or hypercapnia. -Supplemental O2 to maintain a saturation 88-92 percent -Schedule DuoNeb's every 4 hours, Pulmicort nebs 2 times a day; Both of these for 48 hours total. -Prednisone 30 mg daily 5 days -Azithromycin 5 days total -Tobacco counseling cessation given to the patient.  This is paramount to overall improvement in her health status and prevention of upper respiratory tract infections/bronchitis/pneumonia/COPD exacerbation. I have also advised patient that using electronic cigarettes or vapor devices is not recommended, these still have noxious substances that can worsen COPD. -Patient is more of a COPD type pathophysiology at this point than asthmatic. She'll benefit more from a inhale corticosteroid combined with a long-acting beta agonists or a long-acting anticholinergic. continue with Dulera during this admission.    Marland Kitchen  Rest per NP/medical resident whose note is outlined above and that I agree with  Pulmonary Care  Time devoted to patient care services described in this note is  35 Minutes.   This time reflects time of care of this signee Dr Julia [name].  This critical care time does not reflect procedure time, or teaching time or supervisory time of PA/NP/Med-student/Med Resident etc but could involve care discussion time.  Julia Acre, MD [place] [facility] Pager [phone] (please enter 7-digits) On Call Pager - [phone] (please enter 7-digits)  Note: This note was prepared with Dragon dictation along with smaller phrase technology. Any transcriptional errors that result from this process are unintentional.

## 2015-11-09 NOTE — Consult Note (Signed)
MEDICATION RELATED CONSULT NOTE - INITIAL   Pharmacy Consult for electrolyte management Indication: 20 beat run of vfibb/vtach  Allergies  Allergen Reactions  . Atrovent [Ipratropium] Other (See Comments) and Shortness Of Breath    Unknown     Patient Measurements: Height: 5\' 5"  (165.1 cm) Weight: 152 lb 12.5 oz (69.3 kg) IBW/kg (Calculated) : 57 Adjusted Body Weight:   Vital Signs: Temp: 97.7 F (36.5 C) (07/08 2000) Temp Source: Oral (07/08 2000) BP: 117/76 mmHg (07/09 0600) Pulse Rate: 73 (07/09 0600) Intake/Output from previous day: 07/08 0701 - 07/09 0700 In: 1210 [P.O.:960; IV Piggyback:250] Out: 925 [Urine:925] Intake/Output from this shift: Total I/O In: 720 [P.O.:720] Out: 925 [Urine:925]  Labs:  Recent Labs  11/08/15 0920 11/08/15 1852 11/09/15 0503  WBC 5.6  --  4.6  HGB 9.0*  --  9.1*  HCT 27.5*  --  28.7*  PLT 180  --  188  CREATININE 0.80  --  0.77  MG  --  1.7 1.8  PHOS  --  2.6 3.3  ALBUMIN 4.0  --   --   PROT 7.4  --   --   AST 26  --   --   ALT 18  --   --   ALKPHOS 66  --   --   BILITOT 0.3  --   --    Estimated Creatinine Clearance: 77.6 mL/min (by C-G formula based on Cr of 0.77).   Microbiology: Recent Results (from the past 720 hour(s))  MRSA PCR Screening     Status: None   Collection Time: 11/08/15  1:20 PM  Result Value Ref Range Status   MRSA by PCR NEGATIVE NEGATIVE Final    Comment:        The GeneXpert MRSA Assay (FDA approved for NASAL specimens only), is one component of a comprehensive MRSA colonization surveillance program. It is not intended to diagnose MRSA infection nor to guide or monitor treatment for MRSA infections.     Medical History: Past Medical History  Diagnosis Date  . COPD (chronic obstructive pulmonary disease) (HCC)   . Asthma     Medications:  Scheduled:  . albuterol  2.5 mg Nebulization Q4H  . ALPRAZolam  0.5 mg Oral QHS  . azithromycin  500 mg Intravenous Q24H  . budesonide  (PULMICORT) nebulizer solution  0.25 mg Nebulization BID  . enoxaparin (LOVENOX) injection  40 mg Subcutaneous Q24H  . mometasone-formoterol  2 puff Inhalation BID  . pantoprazole  40 mg Oral Q1200  . predniSONE  40 mg Oral Q breakfast    Assessment: Pt had a 20 beat run of vfibb/vtach. Mg, phos, K level checked. All are WNL.  Goal of Therapy:  Normalization of electrolytes  Plan:  No supplementation needed at this time. Will recheck in the AM.  Carola FrostNathan A Emelynn Rance, Pharm.D., BCPS Clinical Pharmacist 11/09/2015,6:36 AM

## 2015-11-09 NOTE — Progress Notes (Signed)
Patient has done well during shift.  A&Ox4.  Headache controlled with percocet.  NSR per cardiac monitor.  VSS with o2 sats upper 90's on 2L nasal cannula.  No respiratory distress noted at rest.  Slight shortness of breath with getting up to bedside commode.  Tolerating diet.  Patient is med surg level of care waiting for a bed.

## 2015-11-09 NOTE — Progress Notes (Signed)
[facility] - [place] at [facility]   PATIENT NAME: Julia [name]    MR#:  [id number]  DATE OF BIRTH:  [birth year]  SUBJECTIVE:  CHIEF COMPLAINT:   Chief Complaint  Patient presents with  . Shortness of Breath   Came with COPD exacerbation and required BiPAP initially, now 1 back to her baseline nasal cannula oxygen. Still has some wheezing but feels much better.  REVIEW OF SYSTEMS:  CONSTITUTIONAL: No fever, fatigue or weakness.  EYES: No blurred or double vision.  EARS, NOSE, AND THROAT: No tinnitus or ear pain.  RESPIRATORY: No cough, Positive for shortness of breath, wheezing, no hemoptysis.  CARDIOVASCULAR: No chest pain, orthopnea, edema.  GASTROINTESTINAL: No nausea, vomiting, diarrhea or abdominal pain.  GENITOURINARY: No dysuria, hematuria.  ENDOCRINE: No polyuria, nocturia,  HEMATOLOGY: No anemia, easy bruising or bleeding SKIN: No rash or lesion. MUSCULOSKELETAL: No joint pain or arthritis.   NEUROLOGIC: No tingling, numbness, weakness.  PSYCHIATRY: No anxiety or depression.   ROS  DRUG ALLERGIES:   Allergies  Allergen Reactions  . Atrovent [Ipratropium] Other (See Comments) and Shortness Of Breath    Unknown     VITALS:  Blood pressure 106/83, pulse 61, temperature 98 F (36.7 C), temperature source Oral, resp. rate 18, height 5\' 5"  (1.651 m), weight 69.3 kg (152 lb 12.5 oz), SpO2 98 %.  PHYSICAL EXAMINATION:  GENERAL:  55 y.o.-year-old patient lying in the bed with no acute distress.  EYES: Pupils equal, round, reactive to light and accommodation. No scleral icterus. Extraocular muscles intact.  HEENT: Head atraumatic, normocephalic. Oropharynx and nasopharynx clear.  NECK:  Supple, no jugular venous distention. No thyroid enlargement, no tenderness.  LUNGS: Normal breath sounds bilaterally, Bilateral expiratory wheezing, no crepitation. No use of accessory muscles of respiration. On supplemental oxygen via nasal  cannula. CARDIOVASCULAR: S1, S2 normal. No murmurs, rubs, or gallops.  ABDOMEN: Soft, nontender, nondistended. Bowel sounds present. No organomegaly or mass.  EXTREMITIES: No pedal edema, cyanosis, or clubbing.  NEUROLOGIC: Cranial nerves II through XII are intact. Muscle strength 5/5 in all extremities. Sensation intact. Gait not checked.  PSYCHIATRIC: The patient is alert and oriented x 3.  SKIN: No obvious rash, lesion, or ulcer.   Physical Exam LABORATORY PANEL:   CBC  Recent Labs Lab 11/09/15 0503  WBC 4.6  HGB 9.1*  HCT 28.7*  PLT 188   ------------------------------------------------------------------------------------------------------------------  Chemistries   Recent Labs Lab 11/08/15 0920  11/09/15 0503  NA 138  --  139  K 4.2  --  4.1  CL 102  --  106  CO2 28  --  28  GLUCOSE 123*  --  117*  BUN 20  --  18  CREATININE 0.80  --  0.77  CALCIUM 9.6  --  9.8  MG  --   < > 1.8  AST 26  --   --   ALT 18  --   --   ALKPHOS 66  --   --   BILITOT 0.3  --   --   < > = values in this interval not displayed. ------------------------------------------------------------------------------------------------------------------  Cardiac Enzymes  Recent Labs Lab 11/08/15 0920  TROPONINI <0.03   ------------------------------------------------------------------------------------------------------------------  RADIOLOGY:  Dg Chest 2 View  11/08/2015  CLINICAL DATA:  Shortness of breath EXAM: CHEST  2 VIEW COMPARISON:  04/07/2015 chest radiograph. FINDINGS: Stable cardiomediastinal silhouette with top-normal heart size and small hiatal hernia. No pneumothorax. No pleural effusion. No overt pulmonary edema. No acute consolidative airspace  disease. Stable chronic mild lower thoracic vertebral compression fracture. Mild thoracic spondylosis. IMPRESSION: 1. No active pulmonary disease. 2. Small hiatal hernia. Electronically Signed   By: Delbert Phenix M.D.   On: 11/08/2015 10:14    Dg Chest Port 1 View  11/09/2015  CLINICAL DATA:  Short of breath EXAM: PORTABLE CHEST 1 VIEW COMPARISON:  Yesterday FINDINGS: Moderate cardiomegaly. Stable hiatal hernia. Lungs hyperaerated. Normal vascularity. No new consolidation or mass. IMPRESSION: Cardiomegaly without decompensation. Electronically Signed   By: Jolaine Click M.D.   On: 11/09/2015 08:08    ASSESSMENT AND PLAN:   Active Problems:   Acute respiratory failure (HCC)  * Acute on chronic hypoxic respiratory failure * Acute COPD exacerbation    Required BiPAP initially, now back to nasal cannula oxygen.   Continue nebulizer therapy and steroids, switched to oral.   Azithromycin.  * Anxiety   Continue Xanax.  * Smoking   Counseled to quit smoking for 4 minutes and offered nicotine patch.    All the records are reviewed and case discussed with Care Management/Social Workerr. Management plans discussed with the patient, family and they are in agreement.  CODE STATUS: Full code.  TOTAL TIME TAKING CARE OF THIS PATIENT: 35  minutes.     POSSIBLE D/C IN 1-2 DAYS, DEPENDING ON CLINICAL CONDITION.   Altamese Dilling M.D on 11/09/2015   Between 7am to 6pm - Pager - [phone]  After 6pm go to [email or web address] - password [email or web address]  [facility] [place] [facility]  Office  [phone]  CC: Primary care physician; [facility] PRIMARY CARE  Note: This dictation was prepared with Dragon dictation along with smaller phrase technology. Any transcriptional errors that result from this process are unintentional.

## 2015-11-10 DIAGNOSIS — J9600 Acute respiratory failure, unspecified whether with hypoxia or hypercapnia: Secondary | ICD-10-CM

## 2015-11-10 LAB — BASIC METABOLIC PANEL
ANION GAP: 6 (ref 5–15)
BUN: 22 mg/dL — ABNORMAL HIGH (ref 6–20)
CALCIUM: 9.2 mg/dL (ref 8.9–10.3)
CO2: 29 mmol/L (ref 22–32)
Chloride: 104 mmol/L (ref 101–111)
Creatinine, Ser: 0.75 mg/dL (ref 0.44–1.00)
Glucose, Bld: 127 mg/dL — ABNORMAL HIGH (ref 65–99)
POTASSIUM: 4 mmol/L (ref 3.5–5.1)
Sodium: 139 mmol/L (ref 135–145)

## 2015-11-10 LAB — PHOSPHORUS: PHOSPHORUS: 3.3 mg/dL (ref 2.5–4.6)

## 2015-11-10 LAB — MAGNESIUM: Magnesium: 1.7 mg/dL (ref 1.7–2.4)

## 2015-11-10 LAB — GLUCOSE, CAPILLARY: GLUCOSE-CAPILLARY: 169 mg/dL — AB (ref 65–99)

## 2015-11-10 MED ORDER — FERROUS SULFATE 325 (65 FE) MG PO TABS: 325.0000 mg | ORAL_TABLET | Freq: Two times a day (BID) | ORAL | Status: DC

## 2015-11-10 MED ORDER — TIOTROPIUM BROMIDE MONOHYDRATE 18 MCG IN CAPS
18.0000 ug | ORAL_CAPSULE | Freq: Every day | RESPIRATORY_TRACT | Status: DC
  Administered 2015-11-10: 18 ug via RESPIRATORY_TRACT
  Filled 2015-11-10: qty 5

## 2015-11-10 MED ORDER — BUDESONIDE 0.5 MG/2ML IN SUSP
0.5000 mg | Freq: Two times a day (BID) | RESPIRATORY_TRACT | Status: DC
  Administered 2015-11-10: 0.5 mg via RESPIRATORY_TRACT
  Filled 2015-11-10: qty 2

## 2015-11-10 MED ORDER — MOMETASONE FURO-FORMOTEROL FUM 200-5 MCG/ACT IN AERO
2.0000 | INHALATION_SPRAY | Freq: Two times a day (BID) | RESPIRATORY_TRACT | Status: DC
  Filled 2015-11-10: qty 8.8

## 2015-11-10 MED ORDER — PREDNISONE 10 MG PO TABS: 10.0000 mg | ORAL_TABLET | Freq: Every day | ORAL | Status: DC

## 2015-11-10 MED ORDER — AZITHROMYCIN 250 MG PO TABS
500.0000 mg | ORAL_TABLET | Freq: Every day | ORAL | Status: DC
  Administered 2015-11-10: 500 mg via ORAL
  Filled 2015-11-10: qty 2

## 2015-11-10 MED ORDER — BUDESONIDE 0.5 MG/2ML IN SUSP: 0.5000 mg | Freq: Two times a day (BID) | RESPIRATORY_TRACT | Status: DC | PRN

## 2015-11-10 MED ORDER — AZITHROMYCIN 250 MG PO TABS: 250.0000 mg | ORAL_TABLET | Freq: Every day | ORAL | Status: DC

## 2015-11-10 MED ORDER — TIOTROPIUM BROMIDE MONOHYDRATE 18 MCG IN CAPS: 18.0000 ug | ORAL_CAPSULE | Freq: Every day | RESPIRATORY_TRACT | Status: DC

## 2015-11-10 NOTE — Discharge Summary (Signed)
[facility] - [place] at [facility]   PATIENT NAME: Julia [name]    MR#:  [id number]  DATE OF BIRTH:  [birth year]  DATE OF ADMISSION:  11/08/2015 ADMITTING PHYSICIAN: Ihor Austin, MD  DATE OF DISCHARGE: 11/10/2015  PRIMARY CARE PHYSICIAN: [facility]    ADMISSION DIAGNOSIS:  COPD exacerbation (HCC) [J44.1]  DISCHARGE DIAGNOSIS:  Active Problems:   Acute respiratory failure (HCC)   SECONDARY DIAGNOSIS:   Past Medical History  Diagnosis Date  . COPD (chronic obstructive pulmonary disease) (HCC)   . Asthma     HOSPITAL COURSE:   * Acute on chronic hypoxic respiratory failure * Acute COPD exacerbation  Required BiPAP initially, now back to nasal cannula oxygen.  Continue nebulizer therapy and steroids, switched to oral.  Azithromycin.   Felt Much better on ambulation, O2 sats stable on room air.  * Anxiety  Continue Xanax.  * Smoking  Counseled to quit smoking for 4 minutes and offered nicotine patch.  DISCHARGE CONDITIONS:   Stable.  CONSULTS OBTAINED:     DRUG ALLERGIES:   Allergies  Allergen Reactions  . Atrovent [Ipratropium] Other (See Comments) and Shortness Of Breath    Unknown     DISCHARGE MEDICATIONS:   Current Discharge Medication List    START taking these medications   Details  budesonide (PULMICORT) 0.5 MG/2ML nebulizer solution Take 2 mLs (0.5 mg total) by nebulization 2 (two) times daily as needed. Qty: 50 mL, Refills: 1    ferrous sulfate 325 (65 FE) MG tablet Take 1 tablet (325 mg total) by mouth 2 (two) times daily with a meal. Qty: 60 tablet, Refills: 1    tiotropium (SPIRIVA) 18 MCG inhalation capsule Place 1 capsule (18 mcg total) into inhaler and inhale daily. Qty: 30 capsule, Refills: 1      CONTINUE these medications which have CHANGED   Details  azithromycin (ZITHROMAX) 250 MG tablet Take 1 tablet (250 mg total) by mouth daily. Qty: 4 each, Refills: 0    predniSONE  (DELTASONE) 10 MG tablet Take 1 tablet (10 mg total) by mouth daily with breakfast. Qty: 20 tablet, Refills: 0      CONTINUE these medications which have NOT CHANGED   Details  albuterol (PROVENTIL HFA;VENTOLIN HFA) 108 (90 BASE) MCG/ACT inhaler Inhale 2 puffs into the lungs every 6 (six) hours as needed for wheezing or shortness of breath. Qty: 1 Inhaler, Refills: 2    Aloe Vera 470 MG CAPS Take 470 mg by mouth daily.    ALPRAZolam (XANAX) 0.5 MG tablet Take 0.5 mg by mouth at bedtime.    beclomethasone (QVAR) 80 MCG/ACT inhaler Inhale 2 puffs into the lungs 2 (two) times daily.    docusate sodium (COLACE) 100 MG capsule Take 100 mg by mouth daily as needed for mild constipation.    magnesium oxide (MAG-OX) 400 MG tablet Take 400 mg by mouth daily.    Multiple Vitamins-Minerals (CENTRUM SILVER PO) Take 1 tablet by mouth daily.    pantoprazole (PROTONIX) 40 MG tablet Take 40 mg by mouth daily.    Potassium 99 MG TABS Take 1 tablet by mouth daily.    sertraline (ZOLOFT) 100 MG tablet Take 100 mg by mouth daily.    benzonatate (TESSALON) 100 MG capsule Take 1 capsule (100 mg total) by mouth 3 (three) times daily as needed for cough. Qty: 20 capsule, Refills: 0    nicotine (NICODERM CQ - DOSED IN MG/24 HOURS) 21 mg/24hr patch Place 1 patch (21  mg total) onto the skin daily. Qty: 28 patch, Refills: 0         DISCHARGE INSTRUCTIONS:   Follow with Pulmonologist in 2 weeks.  If you experience worsening of your admission symptoms, develop shortness of breath, life threatening emergency, suicidal or homicidal thoughts you must seek medical attention immediately by calling 911 or calling your MD immediately  if symptoms less severe.  You Must read complete instructions/literature along with all the possible adverse reactions/side effects for all the Medicines you take and that have been prescribed to you. Take any new Medicines after you have completely understood and accept all the  possible adverse reactions/side effects.   Please note  You were cared for by a hospitalist during your hospital stay. If you have any questions about your discharge medications or the care you received while you were in the hospital after you are discharged, you can call the unit and asked to speak with the hospitalist on call if the hospitalist that took care of you is not available. Once you are discharged, your primary care physician will handle any further medical issues. Please note that NO REFILLS for any discharge medications will be authorized once you are discharged, as it is imperative that you return to your primary care physician (or establish a relationship with a primary care physician if you do not have one) for your aftercare needs so that they can reassess your need for medications and monitor your lab values.    Today   CHIEF COMPLAINT:   Chief Complaint  Patient presents with  . Shortness of Breath    HISTORY OF PRESENT ILLNESS:  Julia [name]  is a 55 y.o. female with a known history of current 1/2 pack daily smoker (42 yr hx), COPD (3L O2 qhs), and Asthma. [facility] with c/o worsening shortness of breath onset 7/6 requiring outpatient breathing treatments q4hrs with minimal improvement of symptoms. She uses home O2 3L via nasal canula qhs at home. PCCM admitting to stepdown unit due to acute respiratory failure secondary to AECOPD.  VITAL SIGNS:  Blood pressure 97/70, pulse 76, temperature 97.4 F (36.3 C), temperature source Oral, resp. rate 15, height 5\' 5"  (1.651 m), weight 69.1 kg (152 lb 5.4 oz), SpO2 93 %.  I/O:   Intake/Output Summary (Last 24 hours) at 11/10/15 1410 Last data filed at 11/10/15 0900  Gross per 24 hour  Intake    480 ml  Output    400 ml  Net     80 ml    PHYSICAL EXAMINATION:  GENERAL:  55 y.o.-year-old patient lying in the bed with no acute distress.  EYES: Pupils equal, round, reactive to light and  accommodation. No scleral icterus. Extraocular muscles intact.  HEENT: Head atraumatic, normocephalic. Oropharynx and nasopharynx clear.  NECK:  Supple, no jugular venous distention. No thyroid enlargement, no tenderness.  LUNGS: Normal breath sounds bilaterally, mild wheezing. No use of accessory muscles of respiration.  CARDIOVASCULAR: S1, S2 normal. No murmurs, rubs, or gallops.  ABDOMEN: Soft, non-tender, non-distended. Bowel sounds present. No organomegaly or mass.  EXTREMITIES: No pedal edema, cyanosis, or clubbing.  NEUROLOGIC: Cranial nerves II through XII are intact. Muscle strength 5/5 in all extremities. Sensation intact. Gait not checked.  PSYCHIATRIC: The patient is alert and oriented x 3.  SKIN: No obvious rash, lesion, or ulcer.   DATA REVIEW:   CBC  Recent Labs Lab 11/09/15 0503  WBC 4.6  HGB 9.1*  HCT 28.7*  PLT 188    Chemistries   Recent Labs Lab 11/08/15 0920  11/10/15 0429  NA 138  < > 139  K 4.2  < > 4.0  CL 102  < > 104  CO2 28  < > 29  GLUCOSE 123*  < > 127*  BUN 20  < > 22*  CREATININE 0.80  < > 0.75  CALCIUM 9.6  < > 9.2  MG  --   < > 1.7  AST 26  --   --   ALT 18  --   --   ALKPHOS 66  --   --   BILITOT 0.3  --   --   < > = values in this interval not displayed.  Cardiac Enzymes  Recent Labs Lab 11/08/15 0920  TROPONINI <0.03    Microbiology Results  Results for orders placed or performed during the hospital encounter of 11/08/15  Blood culture (routine x 2)     Status: None (Preliminary result)   Collection Time: 11/08/15 10:52 AM  Result Value Ref Range Status   Specimen Description BLOOD LEFT ASSIST CONTROL  Final   Special Requests   Final    BOTTLES DRAWN AEROBIC AND ANAEROBIC 5 CC AEROBIC, 1 CC ANAEROBIC   Culture NO GROWTH 2 DAYS  Final   Report Status PENDING  Incomplete  Blood culture (routine x 2)     Status: None (Preliminary result)   Collection Time: 11/08/15 10:52 AM  Result Value Ref Range Status   Specimen  Description BLOOD RIGHT FOREARM  Final   Special Requests   Final    BOTTLES DRAWN AEROBIC AND ANAEROBIC  5 CC AEROBIC, 3 CC ANAEROBIC   Culture NO GROWTH 2 DAYS  Final   Report Status PENDING  Incomplete  MRSA PCR Screening     Status: None   Collection Time: 11/08/15  1:20 PM  Result Value Ref Range Status   MRSA by PCR NEGATIVE NEGATIVE Final    Comment:        The GeneXpert MRSA Assay (FDA approved for NASAL specimens only), is one component of a comprehensive MRSA colonization surveillance program. It is not intended to diagnose MRSA infection nor to guide or monitor treatment for MRSA infections.     RADIOLOGY:  Dg Chest Port 1 View  11/09/2015  CLINICAL DATA:  Short of breath EXAM: PORTABLE CHEST 1 VIEW COMPARISON:  Yesterday FINDINGS: Moderate cardiomegaly. Stable hiatal hernia. Lungs hyperaerated. Normal vascularity. No new consolidation or mass. IMPRESSION: Cardiomegaly without decompensation. Electronically Signed   By: Jolaine Click M.D.   On: 11/09/2015 08:08    EKG:   Orders placed or performed during the hospital encounter of 11/08/15  . EKG 12-Lead  . EKG 12-Lead  . EKG 12-Lead  . EKG 12-Lead      Management plans discussed with the patient, family and they are in agreement.  CODE STATUS:     Code Status Orders        Start     Ordered   11/08/15 1156  Full code   Continuous     11/08/15 1157    Code Status History    Date Active Date Inactive Code Status Order ID Comments User Context   04/07/2015  9:53 AM 04/09/2015  6:16 PM Full Code [id number]  Shaune Pollack, MD Inpatient    Advance Directive Documentation        Most Recent Value   Type of Advance Directive  Healthcare Power of [place],  Living will   Pre-existing out of facility DNR order (yellow form or pink MOST form)     "MOST" Form in Place?        TOTAL TIME TAKING CARE OF THIS PATIENT: 35 minutes.    Altamese Dilling M.D on 11/10/2015 at 2:10 PM  Between 7am to 6pm - Pager -  [phone]  After 6pm go to [email or web address] - password [email or web address]  [facility] [place] [facility]  Office  [phone]  CC: Primary care physician; [facility] PRIMARY CARE   Note: This dictation was prepared with Dragon dictation along with smaller phrase technology. Any transcriptional errors that result from this process are unintentional.

## 2015-11-10 NOTE — Progress Notes (Signed)
RN spoke with Dr. Desiree HaneVachani and made MD aware that RN walked with patient twice around ICU while monitoring her heart rate and pulse ox.  Patient did well and heart rate did not go above 112 and oxygen level stayed around 90-93% with the exception of dropping to 88% once.  Patient denied feeling short of breath with ambulation stating "i feel the same as i do when I'm at home doing things." Dr. Desiree HaneVachani stated "okay I'm going to discharge her home."

## 2015-11-10 NOTE — Progress Notes (Signed)
Report called to Duwayne Heckanielle, RN on 1C.  Patient has been A&Ox4 with no complaints of pain.  NSR per cardiac monitor with stable vital signs.  No respiratory distress. O2 sats low to mid 90's on RA.

## 2015-11-10 NOTE — Progress Notes (Signed)
PULMONARY / CRITICAL CARE MEDICINE   Name: Julia [name] MRN: [id number] DOB: [birth year]    ADMISSION DATE:  11/08/2015 CONSULTATION DATE:  11/08/15  REFERRING MD:  Dr. Inocencio Homes  CHIEF COMPLAINT:  Acute Respiratory Failure  DISCUSSION: This is a 55 yo female who presented to [facility] on 7/8 with c/o worsening shortness of breath secondary to AECOPD requiring bipap  SUBJECTIVE:  Off biPAP, feels better this AM, wants to go home Still with b/l wheezing Ok to transfer to gen med floor  VITAL SIGNS: BP 98/63 mmHg  Pulse 62  Temp(Src) 97.8 F (36.6 C) (Oral)  Resp 16  Ht  (1.651 m)  Wt 152 lb 5.4 oz (69.1 kg)  BMI 25.35 kg/m2  SpO2 97%  INTAKE / OUTPUT: I/O last 3 completed shifts: In: 1690 [P.O.:1440; IV Piggyback:250] Out: 1700 [Urine:1700]  PHYSICAL EXAMINATION: General:  Caucasian female appears to be in no acute distress. Neuro:  Alert and oriented, follows commands HEENT:  Supple, No JVD Cardiovascular:  s1s2, rrr, no M/R/G Lungs:  Diminished towards bases but otherwise clear, even and non labored, +b/l wheezing Abdomen:  +BSx4, soft, non tender, non distended Musculoskeletal: no inflammation/deformity noted Skin:  Intact, no rash or lesions  LABS:  BMET  Recent Labs Lab 11/08/15 0920 11/09/15 0503 11/10/15 0429  NA 138 139 139  K 4.2 4.1 4.0  CL 102 106 104  CO2 BUN 20 18 22*  CREATININE 0.80 0.77 0.75  GLUCOSE 123* 117* 127*    Electrolytes  Recent Labs Lab 11/08/15 0920 11/08/15 1852 11/09/15 0503 11/10/15 0429  CALCIUM 9.6  --  9.8 9.2  MG  --  1.7 1.8 1.7  PHOS  --  2.6 3.3 3.3    CBC  Recent Labs Lab 11/08/15 0920 11/09/15 0503  WBC 5.6 4.6  HGB 9.0* 9.1*  HCT 27.5* 28.7*  PLT 180 188    Coag's No results for input(s): APTT, INR in the last 168 hours.  Sepsis Markers No results for input(s): LATICACIDVEN, PROCALCITON, O2SATVEN in the last 168 hours.  ABG  Recent Labs Lab 11/08/15 1148 11/09/15 0444   PHART 7.36 7.35  PCO2ART 51* 50*  PO2ART 51* 146*    Liver Enzymes  Recent Labs Lab 11/08/15 0920  AST 26  ALT 18  ALKPHOS 66  BILITOT 0.3  ALBUMIN 4.0    Cardiac Enzymes  Recent Labs Lab 11/08/15 0920  TROPONINI <0.03    Glucose No results for input(s): GLUCAP in the last 168 hours.  Imaging No results found.  STUDIES:  None  CULTURES: Blood 7/8>>  ANTIBIOTICS: Levaquin 7/8>>once Azithromycin 7/8>>  SIGNIFICANT EVENTS: 7/8>>Pt presented to [facility] due to acute respiratory failure secondary to AECOPD requiring Bipap PCCM admitted to stepdown unit  LINES/TUBES: PIV   ASSESSMENT / PLAN:  PULMONARY A: AECOPD Hx: Asthma-unlikely current exacerbation P:  Support with oxygen to keep sats> 88- 92%  BiPAP at night if needed -Duonebs, Dulera, and Pulmicort Continue Prednisone taper over 5 days,day#2 Continue Azithromycin for 5 days, day#2 Intermittent CXR Pulmonary Hygiene Smoking Cessation councelling -will add spiriva  CARDIOVASCULAR A:  No acute issues P:  Telemetry monitoring  RENAL A:   No acute issues P:   Trend BMP's Monitor UOP Replace electrolytes as indicated   GASTROINTESTINAL A:   No acute issues P:   Regular Diet - protonix  HEMATOLOGIC A:   Anemia P:  Lovenox for VTE prophylaxis Monitor for s/sx of bleeding Transfuse for HgB <7  INFECTIOUS A:   No Leukocytosis P:   Trend WBC and monitor fever curve Continue Azithromycin for a total of 5 days, day#2  ENDOCRINE A:   No acute issues   P:   Monitor serum glucose  NEUROLOGIC A:   Hx: Anxiety P:   RASS goal: N/A Continue Alprazolam  Note: Patient transferred to the Hospitalist service on 7/9.  Dr. Vivien RotaPavan Pyreddy. Ok to transfer to gen med floor.  The Patient requires high complexity decision making for assessment and support, frequent evaluation and titration of therapies. Patient satisfied with Plan of action and management. All questions  answered  Lucie LeatherKurian David Aneli Zara, M.D.  [facility]  Medical Director [facility] Medical Director [facility] Cardio-Pulmonary Department

## 2015-11-10 NOTE — Care Management (Signed)
CM assessment for uninsured patient. Patient with acute exacerbation of COPD. Lives at home with her spouse. No DME. Does not drive. [facility]. She has been buying her medications but states she cant get her inhalers. She states she uses her grandsons inhalers because they have medicaid. Provided patient with a medication management application. Patient appreciative.

## 2015-11-10 NOTE — Progress Notes (Signed)
Patient left ICU by wheelchair with Diannia RuderKara, [place].  Alert with no distress noted.  Husband driving patient home.

## 2015-11-10 NOTE — Consult Note (Signed)
MEDICATION RELATED CONSULT NOTE - INITIAL   Pharmacy Consult for electrolyte management Indication: 20 beat run of vfibb/vtach  Allergies  Allergen Reactions  . Atrovent [Ipratropium] Other (See Comments) and Shortness Of Breath    Unknown     Patient Measurements: Height:  (165.1 cm) Weight: 152 lb 12.5 oz (69.3 kg) IBW/kg (Calculated) : 57 Adjusted Body Weight:   Vital Signs: Temp: 98.2 F (36.8 C) (07/09 1930) Temp Source: Oral (07/09 1930) Intake/Output from previous day: 07/09 0701 - 07/10 0700 In: 970 [P.O.:720; IV Piggyback:250] Out: 775 [Urine:775] Intake/Output from this shift:    Labs:  Recent Labs  11/08/15 0920 11/08/15 1852 11/09/15 0503 11/10/15 0429  WBC 5.6  --  4.6  --   HGB 9.0*  --  9.1*  --   HCT 27.5*  --  28.7*  --   PLT 180  --  188  --   CREATININE 0.80  --  0.77 0.75  MG  --  1.7 1.8 1.7  PHOS  --  2.6 3.3 3.3  ALBUMIN 4.0  --   --   --   PROT 7.4  --   --   --   AST 26  --   --   --   ALT 18  --   --   --   ALKPHOS 66  --   --   --   BILITOT 0.3  --   --   --    Estimated Creatinine Clearance: 77.6 mL/min (by C-G formula based on Cr of 0.75).   Microbiology: Recent Results (from the past 720 hour(s))  Blood culture (routine x 2)     Status: None (Preliminary result)   Collection Time: 11/08/15 10:52 AM  Result Value Ref Range Status   Specimen Description BLOOD LEFT ASSIST CONTROL  Final   Special Requests   Final    BOTTLES DRAWN AEROBIC AND ANAEROBIC 5 CC AEROBIC, 1 CC ANAEROBIC   Culture NO GROWTH < 24 HOURS  Final   Report Status PENDING  Incomplete  Blood culture (routine x 2)     Status: None (Preliminary result)   Collection Time: 11/08/15 10:52 AM  Result Value Ref Range Status   Specimen Description BLOOD RIGHT FOREARM  Final   Special Requests   Final    BOTTLES DRAWN AEROBIC AND ANAEROBIC  5 CC AEROBIC, 3 CC ANAEROBIC   Culture NO GROWTH < 24 HOURS  Final   Report Status PENDING  Incomplete  MRSA PCR  Screening     Status: None   Collection Time: 11/08/15  1:20 PM  Result Value Ref Range Status   MRSA by PCR NEGATIVE NEGATIVE Final    Comment:        The GeneXpert MRSA Assay (FDA approved for NASAL specimens only), is one component of a comprehensive MRSA colonization surveillance program. It is not intended to diagnose MRSA infection nor to guide or monitor treatment for MRSA infections.     Medical History: Past Medical History  Diagnosis Date  . COPD (chronic obstructive pulmonary disease) (HCC)   . Asthma     Medications:  Scheduled:  . albuterol  2.5 mg Nebulization Q4H  . ALPRAZolam  0.5 mg Oral QHS  . azithromycin  500 mg Intravenous Q24H  . enoxaparin (LOVENOX) injection  40 mg Subcutaneous Q24H  . mometasone-formoterol  2 puff Inhalation BID  . pantoprazole  40 mg Oral Q1200  . predniSONE  40 mg Oral Q breakfast  Assessment: Pt had a 20 beat run of vfibb/vtach. Mg, phos, K level checked. All are WNL.  Goal of Therapy:  Normalization of electrolytes  Plan:  No supplementation needed at this time. Will recheck in the AM in 2 days  Carola FrostNathan A Mylan Lengyel, Pharm.D., BCPS Clinical Pharmacist 11/10/2015,5:48 AM

## 2015-11-10 NOTE — Progress Notes (Signed)
Discharge instructions and new prescriptions given to and reviewed with patient.  Patient verbalized understanding of all discharge instructions, new prescriptions and follow up appointment.  Waiting for husband to come pick up patient.

## 2015-11-13 LAB — CULTURE, BLOOD (ROUTINE X 2)
CULTURE: NO GROWTH
Culture: NO GROWTH

## 2015-11-24 ENCOUNTER — Inpatient Hospital Stay: Payer: Self-pay | Admitting: Internal Medicine

## 2015-11-25 ENCOUNTER — Ambulatory Visit: Payer: Self-pay

## 2015-12-01 ENCOUNTER — Ambulatory Visit: Payer: Self-pay

## 2016-01-07 ENCOUNTER — Encounter (INDEPENDENT_AMBULATORY_CARE_PROVIDER_SITE_OTHER): Payer: Self-pay

## 2016-01-07 ENCOUNTER — Ambulatory Visit: Payer: Self-pay

## 2016-01-09 ENCOUNTER — Encounter (INDEPENDENT_AMBULATORY_CARE_PROVIDER_SITE_OTHER): Payer: Self-pay

## 2016-03-01 ENCOUNTER — Emergency Department: Payer: Medicaid Other

## 2016-03-01 ENCOUNTER — Observation Stay
Admission: EM | Admit: 2016-03-01 | Discharge: 2016-03-02 | Disposition: A | Discharge status: Home / Self Care | Payer: Medicaid Other | Attending: Internal Medicine | Admitting: Internal Medicine

## 2016-03-01 DIAGNOSIS — J449 Chronic obstructive pulmonary disease, unspecified: Secondary | ICD-10-CM | POA: Diagnosis present

## 2016-03-01 DIAGNOSIS — Z79899 Other long term (current) drug therapy: Secondary | ICD-10-CM | POA: Diagnosis not present

## 2016-03-01 DIAGNOSIS — J440 Chronic obstructive pulmonary disease with acute lower respiratory infection: Principal | ICD-10-CM | POA: Insufficient documentation

## 2016-03-01 DIAGNOSIS — Z7951 Long term (current) use of inhaled steroids: Secondary | ICD-10-CM | POA: Insufficient documentation

## 2016-03-01 DIAGNOSIS — F17210 Nicotine dependence, cigarettes, uncomplicated: Secondary | ICD-10-CM | POA: Diagnosis not present

## 2016-03-01 DIAGNOSIS — J189 Pneumonia, unspecified organism: Secondary | ICD-10-CM | POA: Insufficient documentation

## 2016-03-01 DIAGNOSIS — D509 Iron deficiency anemia, unspecified: Secondary | ICD-10-CM | POA: Insufficient documentation

## 2016-03-01 DIAGNOSIS — R0602 Shortness of breath: Secondary | ICD-10-CM | POA: Diagnosis present

## 2016-03-01 DIAGNOSIS — J441 Chronic obstructive pulmonary disease with (acute) exacerbation: Secondary | ICD-10-CM | POA: Diagnosis present

## 2016-03-01 HISTORY — DX: Ventral hernia without obstruction or gangrene: K43.9

## 2016-03-01 LAB — URINALYSIS COMPLETE WITH MICROSCOPIC (ARMC ONLY)
BILIRUBIN URINE: NEGATIVE
HGB URINE DIPSTICK: NEGATIVE
Ketones, ur: NEGATIVE mg/dL
Leukocytes, UA: NEGATIVE
NITRITE: NEGATIVE
PH: 6 (ref 5.0–8.0)
Protein, ur: NEGATIVE mg/dL
Specific Gravity, Urine: 1.016 (ref 1.005–1.030)

## 2016-03-01 LAB — CBC
HCT: 34.4 % — ABNORMAL LOW (ref 35.0–47.0)
Hemoglobin: 10.8 g/dL — ABNORMAL LOW (ref 12.0–16.0)
MCH: 26.9 pg (ref 26.0–34.0)
MCHC: 31.5 g/dL — ABNORMAL LOW (ref 32.0–36.0)
MCV: 85.5 fL (ref 80.0–100.0)
PLATELETS: 230 10*3/uL (ref 150–440)
RBC: 4.03 MIL/uL (ref 3.80–5.20)
RDW: 18.7 % — AB (ref 11.5–14.5)
WBC: 19 10*3/uL — AB (ref 3.6–11.0)

## 2016-03-01 LAB — TROPONIN I

## 2016-03-01 LAB — COMPREHENSIVE METABOLIC PANEL
ALBUMIN: 3.9 g/dL (ref 3.5–5.0)
ALT: 17 U/L (ref 14–54)
AST: 19 U/L (ref 15–41)
Alkaline Phosphatase: 58 U/L (ref 38–126)
Anion gap: 10 (ref 5–15)
BUN: 22 mg/dL — AB (ref 6–20)
CHLORIDE: 99 mmol/L — AB (ref 101–111)
CO2: 28 mmol/L (ref 22–32)
Calcium: 9.7 mg/dL (ref 8.9–10.3)
Creatinine, Ser: 0.73 mg/dL (ref 0.44–1.00)
GFR calc Af Amer: >60 mL/min (ref >60)
Glucose, Bld: 122 mg/dL — ABNORMAL HIGH (ref 65–99)
POTASSIUM: 3.6 mmol/L (ref 3.5–5.1)
SODIUM: 137 mmol/L (ref 135–145)
Total Bilirubin: <0.1 mg/dL — ABNORMAL LOW (ref 0.3–1.2)
Total Protein: 7 g/dL (ref 6.5–8.1)

## 2016-03-01 LAB — BLOOD GAS, VENOUS
ACID-BASE EXCESS: 4.2 mmol/L — AB (ref 0.0–2.0)
BICARBONATE: 28.4 mmol/L — AB (ref 20.0–28.0)
FIO2: 0.28
O2 Saturation: 81.6 %
PATIENT TEMPERATURE: 37
PO2 VEN: 43 mmHg (ref 32.0–45.0)
pCO2, Ven: 40 mmHg — ABNORMAL LOW (ref 44.0–60.0)
pH, Ven: 7.46 — ABNORMAL HIGH (ref 7.250–7.430)

## 2016-03-01 LAB — LACTIC ACID, PLASMA
LACTIC ACID, VENOUS: 2.2 mmol/L — AB (ref 0.5–1.9)
Lactic Acid, Venous: 3.1 mmol/L (ref 0.5–1.9)

## 2016-03-01 MED ORDER — FERROUS SULFATE 325 (65 FE) MG PO TABS
325.0000 mg | ORAL_TABLET | Freq: Two times a day (BID) | ORAL | Status: DC
  Administered 2016-03-01 – 2016-03-02 (×2): 325 mg via ORAL
  Filled 2016-03-01 (×2): qty 1

## 2016-03-01 MED ORDER — SERTRALINE HCL 100 MG PO TABS
100.0000 mg | ORAL_TABLET | Freq: Every day | ORAL | Status: DC
  Administered 2016-03-01 – 2016-03-02 (×2): 100 mg via ORAL
  Filled 2016-03-01 (×2): qty 1

## 2016-03-01 MED ORDER — POTASSIUM CHLORIDE CRYS ER 10 MEQ PO TBCR
5.0000 meq | EXTENDED_RELEASE_TABLET | Freq: Every day | ORAL | Status: DC
  Administered 2016-03-02: 5 meq via ORAL
  Filled 2016-03-01: qty 1

## 2016-03-01 MED ORDER — MAGNESIUM OXIDE 400 (241.3 MG) MG PO TABS
400.0000 mg | ORAL_TABLET | Freq: Every day | ORAL | Status: DC
  Administered 2016-03-01: 400 mg via ORAL
  Filled 2016-03-01: qty 1

## 2016-03-01 MED ORDER — DEXTROSE 5 % IV SOLN: 1.0000 g | Freq: Once | INTRAVENOUS | Status: DC

## 2016-03-01 MED ORDER — BUTALBITAL-APAP-CAFFEINE 50-325-40 MG PO TABS
1.0000 | ORAL_TABLET | ORAL | Status: AC
  Administered 2016-03-01: 1 via ORAL

## 2016-03-01 MED ORDER — ALBUTEROL SULFATE (2.5 MG/3ML) 0.083% IN NEBU
2.5000 mg | INHALATION_SOLUTION | Freq: Four times a day (QID) | RESPIRATORY_TRACT | Status: DC
  Administered 2016-03-01 – 2016-03-02 (×3): 2.5 mg via RESPIRATORY_TRACT
  Filled 2016-03-01 (×4): qty 3

## 2016-03-01 MED ORDER — BUTALBITAL-APAP-CAFFEINE 50-325-40 MG PO TABS
ORAL_TABLET | ORAL | Status: AC
Start: 2016-03-01 — End: 2016-03-01
  Administered 2016-03-01: 1 via ORAL
  Filled 2016-03-01: qty 1

## 2016-03-01 MED ORDER — DEXTROSE 5 % IV SOLN
500.0000 mg | Freq: Once | INTRAVENOUS | Status: AC
  Administered 2016-03-01: 500 mg via INTRAVENOUS
  Filled 2016-03-01: qty 500

## 2016-03-01 MED ORDER — CEFTRIAXONE SODIUM-DEXTROSE 1-3.74 GM-% IV SOLR
1.0000 g | INTRAVENOUS | Status: DC
  Administered 2016-03-02: 1 g via INTRAVENOUS
  Filled 2016-03-01: qty 50

## 2016-03-01 MED ORDER — SODIUM CHLORIDE 0.9 % IV BOLUS (SEPSIS)
1000.0000 mL | Freq: Once | INTRAVENOUS | Status: AC
  Administered 2016-03-01: 1000 mL via INTRAVENOUS

## 2016-03-01 MED ORDER — TIOTROPIUM BROMIDE MONOHYDRATE 18 MCG IN CAPS
18.0000 ug | ORAL_CAPSULE | Freq: Every day | RESPIRATORY_TRACT | Status: DC
  Administered 2016-03-02: 09:00:00 18 ug via RESPIRATORY_TRACT
  Filled 2016-03-01: qty 5

## 2016-03-01 MED ORDER — DEXTROSE 5 % IV SOLN: 1.0000 g | INTRAVENOUS | Status: DC

## 2016-03-01 MED ORDER — NICOTINE 21 MG/24HR TD PT24
21.0000 mg | MEDICATED_PATCH | Freq: Every day | TRANSDERMAL | Status: DC
  Administered 2016-03-01 – 2016-03-02 (×2): 21 mg via TRANSDERMAL
  Filled 2016-03-01 (×2): qty 1

## 2016-03-01 MED ORDER — MAGNESIUM OXIDE 400 MG PO TABS: 400.0000 mg | ORAL_TABLET | Freq: Every day | ORAL | Status: DC

## 2016-03-01 MED ORDER — POTASSIUM 99 MG PO TABS: 1.0000 | ORAL_TABLET | Freq: Every day | ORAL | Status: DC

## 2016-03-01 MED ORDER — BUDESONIDE 0.25 MG/2ML IN SUSP
0.2500 mg | Freq: Two times a day (BID) | RESPIRATORY_TRACT | Status: DC
  Administered 2016-03-01 – 2016-03-02 (×2): 0.25 mg via RESPIRATORY_TRACT
  Filled 2016-03-01 (×2): qty 2

## 2016-03-01 MED ORDER — DOCUSATE SODIUM 100 MG PO CAPS: 100.0000 mg | ORAL_CAPSULE | Freq: Every day | ORAL | Status: DC | PRN

## 2016-03-01 MED ORDER — PANTOPRAZOLE SODIUM 40 MG PO TBEC
40.0000 mg | DELAYED_RELEASE_TABLET | Freq: Every day | ORAL | Status: DC
  Administered 2016-03-01 – 2016-03-02 (×2): 40 mg via ORAL
  Filled 2016-03-01 (×2): qty 1

## 2016-03-01 MED ORDER — CEFTRIAXONE SODIUM-DEXTROSE 1-3.74 GM-% IV SOLR
1.0000 g | Freq: Once | INTRAVENOUS | Status: AC
  Administered 2016-03-01: 1 g via INTRAVENOUS
  Filled 2016-03-01: qty 50

## 2016-03-01 MED ORDER — ALPRAZOLAM 0.5 MG PO TABS
0.5000 mg | ORAL_TABLET | Freq: Every day | ORAL | Status: DC
  Administered 2016-03-01: 0.5 mg via ORAL
  Filled 2016-03-01: qty 1

## 2016-03-01 MED ORDER — AZITHROMYCIN 250 MG PO TABS
250.0000 mg | ORAL_TABLET | Freq: Every day | ORAL | Status: DC
  Administered 2016-03-02: 250 mg via ORAL
  Filled 2016-03-01: qty 1

## 2016-03-01 MED ORDER — METHYLPREDNISOLONE SODIUM SUCC 125 MG IJ SOLR
60.0000 mg | Freq: Four times a day (QID) | INTRAMUSCULAR | Status: DC
  Administered 2016-03-01 – 2016-03-02 (×3): 60 mg via INTRAVENOUS
  Filled 2016-03-01 (×3): qty 2

## 2016-03-01 NOTE — ED Notes (Signed)
Pt placed on 2L [place]. States that she wears oxygen at home intermittently as needed when she is SOB. 95% on 2L [place].

## 2016-03-01 NOTE — ED Triage Notes (Signed)
Pt arrives to ER via [facility] from home c/o SOB worsening today. Pt received 5mg  albuterol via nebulizer and 125mg  solumedrol by PIV. Pt c/o headache. Pt able to talk in complete sentences.

## 2016-03-01 NOTE — H&P (Signed)
[facility] [place] [facility]   PATIENT NAME: Julia [name]    MR#:  [id number]  DATE OF BIRTH:  April 28, 1961  DATE OF ADMISSION:  03/01/2016  PRIMARY CARE PHYSICIAN: [facility]   REQUESTING/REFERRING PHYSICIAN: Dr Jene Everyobert Kinner  CHIEF COMPLAINT:   Chief Complaint  Patient presents with  . Shortness of Breath    HISTORY OF PRESENT ILLNESS:  Julia HawkManchell Doby  is a 55 y.o. female presents with shortness of breath over the last few weeks. She painted her laundry room and she thinks that may have triggered the reason why she can't breathe well. Last Wednesday night she ended up in the ER and was given a breathing treatment and prednisone. She's been using her breathing treatments every 4 hours. Today had to calm grandson down and thinks this may have triggered an asthma attack. In the ER, she was found to have a left lower lobe pneumonia and COPD exacerbation hospitalists were contacted for admission. The patient did not initially want to stay in the hospital but states she will stay overnight. Patient states that she wears oxygen at night but she's been wearing it during the day also.  PAST MEDICAL HISTORY:   Past Medical History:  Diagnosis Date  . Asthma   . COPD (chronic obstructive pulmonary disease) (HCC)   . Ventral hernia     PAST SURGICAL HISTORY:   Past Surgical History:  Procedure Laterality Date  . COLON SURGERY    . EAR TUBE REMOVAL    . FRACTURE SURGERY    . HERNIA REPAIR    . TUBAL LIGATION      SOCIAL HISTORY:   Social History  Substance Use Topics  . Smoking status: Current Every Day Smoker    Packs/day: 0.20    Types: Cigarettes  . Smokeless tobacco: Never Used  . Alcohol use No    FAMILY HISTORY:   Family History  Problem Relation Age of Onset  . Sarcoidosis Mother   . Alcohol abuse Father     DRUG ALLERGIES:   Allergies  Allergen Reactions  . Atrovent [Ipratropium] Shortness Of Breath    REVIEW  OF SYSTEMS:  CONSTITUTIONAL: Slight fever, chills and sweats. Positive for weight gain. Positive for fatigue.  EYES: No blurred or double vision.  EARS, NOSE, AND THROAT: No tinnitus or ear pain. No sore throat RESPIRATORY: Positive for cough, shortness of breath, and wheezing. No hemoptysis.  CARDIOVASCULAR: Some chest pain. No orthopnea, edema.  GASTROINTESTINAL: No nausea, vomiting, diarrhea. Positive for chronic abdominal pain with her ventral hernia. No blood in bowel movements GENITOURINARY: No dysuria, hematuria.  ENDOCRINE: No polyuria, nocturia,  HEMATOLOGY: No anemia, easy bruising or bleeding SKIN: Positive for itching on her arms MUSCULOSKELETAL: Positive for joint pains   NEUROLOGIC: No tingling, numbness, weakness. Some dizziness. PSYCHIATRY: No anxiety or depression.   MEDICATIONS AT HOME:   Prior to Admission medications   Medication Sig Start Date End Date Taking? Authorizing Provider  albuterol (PROVENTIL HFA;VENTOLIN HFA) 108 (90 BASE) MCG/ACT inhaler Inhale 2 puffs into the lungs every 6 (six) hours as needed for wheezing or shortness of breath. 04/09/15  Yes Adrian SaranSital Mody, MD  Aloe Vera 470 MG CAPS Take 470 mg by mouth at bedtime.    Yes Historical Provider, MD  ALPRAZolam Prudy Feeler(XANAX) 0.5 MG tablet Take 0.5 mg by mouth at bedtime.   Yes Historical Provider, MD  beclomethasone (QVAR) 80 MCG/ACT inhaler Inhale 2 puffs into the lungs 2 (two) times daily. 06/05/15  06/04/16 Yes Historical Provider, MD  budesonide (PULMICORT) 0.5 MG/2ML nebulizer solution Take 2 mLs (0.5 mg total) by nebulization 2 (two) times daily as needed. 11/10/15  Yes Altamese Dilling, MD  docusate sodium (COLACE) 100 MG capsule Take 100 mg by mouth daily as needed for mild constipation.   Yes Historical Provider, MD  ferrous sulfate 325 (65 FE) MG tablet Take 1 tablet (325 mg total) by mouth 2 (two) times daily with a meal. 11/10/15  Yes Altamese Dilling, MD  magnesium oxide (MAG-OX) 400 MG tablet Take 400  mg by mouth at bedtime.    Yes Historical Provider, MD  Multiple Vitamins-Minerals (CENTRUM SILVER PO) Take 1 tablet by mouth daily.   Yes Historical Provider, MD  nicotine (NICODERM CQ - DOSED IN MG/24 HOURS) 21 mg/24hr patch Place 1 patch (21 mg total) onto the skin daily. 04/09/15  Yes Sital Mody, MD  pantoprazole (PROTONIX) 40 MG tablet Take 40 mg by mouth daily.   Yes Historical Provider, MD  Potassium 99 MG TABS Take 1 tablet by mouth daily.   Yes Historical Provider, MD  sertraline (ZOLOFT) 100 MG tablet Take 100 mg by mouth daily.   Yes Historical Provider, MD  tiotropium (SPIRIVA) 18 MCG inhalation capsule Place 1 capsule (18 mcg total) into inhaler and inhale daily. 11/10/15  Yes Altamese Dilling, MD      VITAL SIGNS:  Blood pressure 132/78, pulse 88, temperature 98.8 F (37.1 C), temperature source Oral, resp. rate (!) 32, weight 70.3 kg (155 lb), SpO2 98 %.  PHYSICAL EXAMINATION:  GENERAL:  55 y.o.-year-old patient lying in the bed with no acute distress.  EYES: Pupils equal, round, reactive to light and accommodation. No scleral icterus. Extraocular muscles intact.  HEENT: Head atraumatic, normocephalic. Oropharynx and nasopharynx clear.  NECK:  Supple, no jugular venous distention. No thyroid enlargement, no tenderness.  LUNGS: Decreased breath sounds bilaterally, no wheezing, rales,rhonchi or crepitation. No use of accessory muscles of respiration.  CARDIOVASCULAR: S1, S2 normal. No murmurs, rubs, or gallops.  ABDOMEN: Soft, positive ventral hernia with tenderness to palpation, nondistended. Bowel sounds present. No organomegaly or mass.  EXTREMITIES: No pedal edema, cyanosis, or clubbing.  NEUROLOGIC: Cranial nerves II through XII are intact. Muscle strength 5/5 in all extremities. Sensation intact. Gait not checked.  PSYCHIATRIC: The patient is alert and oriented x 3.  SKIN: No rash, lesion, or ulcer.   LABORATORY PANEL:   CBC  Recent Labs Lab 03/01/16 1156  WBC  19.0*  HGB 10.8*  HCT 34.4*  PLT 230   ------------------------------------------------------------------------------------------------------------------  Chemistries   Recent Labs Lab 03/01/16 1156  NA 137  K 3.6  CL 99*  CO2 28  GLUCOSE 122*  BUN 22*  CREATININE 0.73  CALCIUM 9.7  AST 19  ALT 17  ALKPHOS 58  BILITOT <0.1*   ------------------------------------------------------------------------------------------------------------------  Cardiac Enzymes  Recent Labs Lab 03/01/16 1156  TROPONINI <0.03   ------------------------------------------------------------------------------------------------------------------  RADIOLOGY:  Dg Chest Portable 1 View  Result Date: 03/01/2016 CLINICAL DATA:  Worsening shortness of breath today.  Headache. EXAM: PORTABLE CHEST 1 VIEW COMPARISON:  11/09/2015 and 11/08/2015. FINDINGS: 1215 hours. The heart size and mediastinal contours are stable. Previously noted probable hiatal hernia not well visualized. There is mild aortic atherosclerosis and chronic vascular congestion. There is new left basilar airspace disease with partial obscuration of the left hemidiaphragm peripherally. The right lung appears clear. There is no pneumothorax. The bones appear unremarkable. IMPRESSION: New left lower lobe airspace disease with possible adjacent small  pleural effusion. This could reflect atelectasis or pneumonia. Correlate clinically. Followup PA and lateral chest X-ray is recommended in 3-4 weeks following trial of antibiotic therapy to ensure resolution and exclude underlying malignancy. Electronically Signed   By: Carey BullocksWilliam  Veazey M.D.   On: 03/01/2016 12:23    EKG:   Normal sinus rhythm 90 bpm  IMPRESSION AND PLAN:   1. COPD exacerbation with poor air entry. Start IV Solu-Medrol 60 mg IV every 6 hours. Albuterol and budesonide nebulizers. IV antibiotics. 2. Left lower lobe pneumonia with leukocytosis. Rocephin and Zithromax ordered here  in the ER and will continue tomorrow. 3. Tobacco abuse smoking cessation counseling done 3 minutes by me. 4. Ventral hernia 5. History anemia on iron  All the records are reviewed and case discussed with ED provider. Management plans discussed with the patient, family and they are in agreement.  CODE STATUS: Full code  TOTAL TIME TAKING CARE OF THIS PATIENT: 50 minutes.    Alford HighlandWIETING, Shanyah Gattuso M.D on 03/01/2016 at 2:11 PM  Between 7am to 6pm - Pager - [phone]  After 6pm call admission pager [phone]  [facility] Office  [phone]  CC: Primary care physician; [facility]

## 2016-03-01 NOTE — ED Provider Notes (Signed)
[facility] Emergency Department Provider Note   ____________________________________________    I have reviewed the triage vital signs and the nursing notes.   HISTORY  Chief Complaint Shortness of Breath     HPI Julia [name] is a 55 y.o. female Who presents with complaints of shortness of breath. Patient is a history of COPD. She reports is worsening over the last several days but became acutely worse today which she attributed to the change in weather.she denies fevers or chills. She denies chest pain. She denies leg pain or swelling. No recent travel.   Past Medical History:  Diagnosis Date  . Asthma   . COPD (chronic obstructive pulmonary disease) [facility](HCC)     Patient Active Problem List   Diagnosis Date Noted  . Acute respiratory failure (HCC) 11/08/2015  . COPD exacerbation (HCC) 04/07/2015    Past Surgical History:  Procedure Laterality Date  . EAR TUBE REMOVAL    . FRACTURE SURGERY    . HERNIA REPAIR    . TUBAL LIGATION      Prior to Admission medications   Medication Sig Start Date End Date Taking? Authorizing Provider  albuterol (PROVENTIL HFA;VENTOLIN HFA) 108 (90 BASE) MCG/ACT inhaler Inhale 2 puffs into the lungs every 6 (six) hours as needed for wheezing or shortness of breath. 04/09/15   Adrian SaranSital Mody, MD  Aloe Vera 470 MG CAPS Take 470 mg by mouth daily.    Historical Provider, MD  ALPRAZolam Prudy Feeler(XANAX) 0.5 MG tablet Take 0.5 mg by mouth at bedtime.    Historical Provider, MD  beclomethasone (QVAR) 80 MCG/ACT inhaler Inhale 2 puffs into the lungs 2 (two) times daily. 06/05/15 06/04/16  Historical Provider, MD  budesonide (PULMICORT) 0.5 MG/2ML nebulizer solution Take 2 mLs (0.5 mg total) by nebulization 2 (two) times daily as needed. 11/10/15   Altamese DillingVaibhavkumar Vachhani, MD  docusate sodium (COLACE) 100 MG capsule Take 100 mg by mouth daily as needed for mild constipation.    Historical Provider, MD  ferrous sulfate 325 (65 FE) MG  tablet Take 1 tablet (325 mg total) by mouth 2 (two) times daily with a meal. 11/10/15   Altamese DillingVaibhavkumar Vachhani, MD  magnesium oxide (MAG-OX) 400 MG tablet Take 400 mg by mouth daily.    Historical Provider, MD  Multiple Vitamins-Minerals (CENTRUM SILVER PO) Take 1 tablet by mouth daily.    Historical Provider, MD  nicotine (NICODERM CQ - DOSED IN MG/24 HOURS) 21 mg/24hr patch Place 1 patch (21 mg total) onto the skin daily. 04/09/15   Adrian SaranSital Mody, MD  pantoprazole (PROTONIX) 40 MG tablet Take 40 mg by mouth daily.    Historical Provider, MD  Potassium 99 MG TABS Take 1 tablet by mouth daily.    Historical Provider, MD  sertraline (ZOLOFT) 100 MG tablet Take 100 mg by mouth daily.    Historical Provider, MD  tiotropium (SPIRIVA) 18 MCG inhalation capsule Place 1 capsule (18 mcg total) into inhaler and inhale daily. 11/10/15   Altamese DillingVaibhavkumar Vachhani, MD     Allergies Atrovent [ipratropium]  No family history on file.  Social History Social History  Substance Use Topics  . Smoking status: Current Every Day Smoker    Packs/day: 0.50    Types: Cigarettes  . Smokeless tobacco: Not on file  . Alcohol use No    Review of Systems  Constitutional: No fever/chills Eyes: No visual changes.  ENT: No notes swelling Cardiovascular: Denies chest pain. Respiratory: as above Gastrointestinal: No abdominal pain.  Genitourinary: Negative for dysuria. Musculoskeletal: Negative for back pain. Skin: Negative for rash. Neurological: Negative for headaches  10-point ROS otherwise negative.  ____________________________________________   PHYSICAL EXAM:  VITAL SIGNS: ED Triage Vitals  Enc Vitals Group     BP 03/01/16 1154 110/67     Pulse Rate 03/01/16 1154 86     Resp 03/01/16 1154 15     Temp 03/01/16 1154 98.8 F (37.1 C)     Temp Source 03/01/16 1154 Oral     SpO2 03/01/16 1154 93 %     Weight 03/01/16 1155 155 lb (70.3 kg)     Height --      Head Circumference --      Peak Flow --       Pain Score 03/01/16 1155 8     Pain Loc --      Pain Edu? --      Excl. in GC? --     Constitutional: Alert and oriented. Mild distress Eyes: Conjunctivae are normal.   Nose: No congestion/rhinnorhea. Mouth/Throat: Mucous membranes are moist.    Cardiovascular: Normal rate, regular rhythm. Grossly normal heart sounds.  Good peripheral circulation. Respiratory: increased respiratory effort with tachypnea.  No retractions. Wheezing diffusely Gastrointestinal: Soft and nontender. No distention.  No CVA tenderness. Genitourinary: deferred Musculoskeletal: No lower extremity tenderness nor edema.  Warm and well perfused Neurologic:  Normal speech and language. No gross focal neurologic deficits are appreciated.  Skin:  Skin is warm, dry and intact. No rash noted. Psychiatric: Mood and affect are normal. Speech and behavior are normal.  ____________________________________________   LABS (all labs ordered are listed, but only abnormal results are displayed)  Labs Reviewed  CBC - Abnormal; Notable for the following:       Result Value   WBC 19.0 (*)    Hemoglobin 10.8 (*)    HCT 34.4 (*)    MCHC 31.5 (*)    RDW 18.7 (*)    All other components within normal limits  COMPREHENSIVE METABOLIC PANEL - Abnormal; Notable for the following:    Chloride 99 (*)    Glucose, Bld 122 (*)    BUN 22 (*)    Total Bilirubin <0.1 (*)    All other components within normal limits  BLOOD GAS, VENOUS - Abnormal; Notable for the following:    pH, Ven 7.46 (*)    pCO2, Ven 40 (*)    Bicarbonate 28.4 (*)    Acid-Base Excess 4.2 (*)    All other components within normal limits  CULTURE, BLOOD (ROUTINE X 2)  CULTURE, BLOOD (ROUTINE X 2)  URINE CULTURE  TROPONIN I  LACTIC ACID, PLASMA  LACTIC ACID, PLASMA  URINALYSIS COMPLETEWITH MICROSCOPIC ([facility] ONLY)   ____________________________________________  EKG  ED ECG REPORT I, Jene EveryKINNER, Divina Neale, the attending physician, personally viewed and  interpreted this ECG.  Date: 03/01/2016 EKG Time: 11:55 AM Rate: 90 Rhythm: normal sinus rhythm QRS Axis: normal Intervals: normal ST/T Wave abnormalities: normal Conduction Disturbances: none Narrative Interpretation: unremarkable  ____________________________________________  RADIOLOGY  Chest x-ray concerning for pneumonia ____________________________________________   PROCEDURES  Procedure(s) performed: No    Critical Care performed: No ____________________________________________   INITIAL IMPRESSION / ASSESSMENT AND PLAN / ED COURSE  Pertinent labs & imaging results that were available during my care of the patient were reviewed by me and considered in my medical decision making (see chart for details).  Patient presents with shortness of breath and diffuse wheezing. EMS gave 2 nebulizers and 125  mg of Solu-Medrol. We will give her a nebulizer here and monitor closely, she may require BiPAP.  Clinical Course  Chest x-ray is concerning for pneumonia and her lab work shows elevated white blood cell count. We will treat with Rocephin and azithromycin after blood cultures. She will require admission. ____________________________________________   FINAL CLINICAL IMPRESSION(S) / ED DIAGNOSES  Final diagnoses:  COPD exacerbation (HCC)  Community acquired pneumonia, unspecified laterality      NEW MEDICATIONS STARTED DURING THIS VISIT:  New Prescriptions   No medications on file     Note:  This document was prepared using Dragon voice recognition software and may include unintentional dictation errors.    Jene Every, MD 03/01/16 1346

## 2016-03-01 NOTE — Progress Notes (Signed)
Per ED nurse Richardean ChimeraAllison Riley MD is aware of lactic acid level, no new orders

## 2016-03-02 LAB — BLOOD CULTURE ID PANEL (REFLEXED)
Acinetobacter baumannii: NOT DETECTED
CANDIDA PARAPSILOSIS: NOT DETECTED
CANDIDA TROPICALIS: NOT DETECTED
Candida albicans: NOT DETECTED
Candida glabrata: NOT DETECTED
Candida krusei: NOT DETECTED
Enterobacter cloacae complex: NOT DETECTED
Enterobacteriaceae species: NOT DETECTED
Enterococcus species: NOT DETECTED
Escherichia coli: NOT DETECTED
HAEMOPHILUS INFLUENZAE: NOT DETECTED
KLEBSIELLA OXYTOCA: NOT DETECTED
KLEBSIELLA PNEUMONIAE: NOT DETECTED
Listeria monocytogenes: NOT DETECTED
METHICILLIN RESISTANCE: DETECTED — AB
Neisseria meningitidis: NOT DETECTED
Proteus species: NOT DETECTED
Pseudomonas aeruginosa: NOT DETECTED
SERRATIA MARCESCENS: NOT DETECTED
STAPHYLOCOCCUS AUREUS BCID: NOT DETECTED
STREPTOCOCCUS PYOGENES: NOT DETECTED
Staphylococcus species: DETECTED — AB
Streptococcus agalactiae: NOT DETECTED
Streptococcus pneumoniae: NOT DETECTED
Streptococcus species: NOT DETECTED

## 2016-03-02 LAB — LACTIC ACID, PLASMA: Lactic Acid, Venous: 2.1 mmol/L (ref 0.5–1.9)

## 2016-03-02 MED ORDER — PREDNISONE 10 MG PO TABS: 10.0000 mg | ORAL_TABLET | Freq: Every day | ORAL | 0 refills | Status: DC

## 2016-03-02 MED ORDER — LEVOFLOXACIN 750 MG PO TABS: 750.0000 mg | ORAL_TABLET | Freq: Every day | ORAL | 0 refills | Status: DC

## 2016-03-02 NOTE — Progress Notes (Signed)
Dr Juliene PinaMody aware of lactic acid level no new orders

## 2016-03-02 NOTE — Progress Notes (Signed)
Dr. Juliene PinaMody acknowledged blood culture results and will be following results. No further action needed at this time. Paper prescription for levaquin given to patient.

## 2016-03-02 NOTE — Progress Notes (Signed)
Received MD order to discharge patient to home, reviewed discharge instructions, home meds and prescriptions with patient and patient verified understanding discharged to home with husband in wheelchair and home o2

## 2016-03-02 NOTE — Progress Notes (Signed)
New lactic acid is 2.1 md aware

## 2016-03-02 NOTE — Progress Notes (Signed)
Dr. Juliene PinaMody paged at patient request for printed prescription for Levaquin. Prescription was sent electronically to incorrect pharmacy per patient and current pharmacy patient uses requires paper prescription. Dr. Juliene PinaMody to print shortly.

## 2016-03-02 NOTE — Discharge Summary (Addendum)
[employer] [facility] [facility] [place] at [facility]   PATIENT NAME: Julia [name]    MR#:  [id number]  DATE OF BIRTH:  Mar 27, 1961  DATE OF ADMISSION:  03/01/2016 ADMITTING PHYSICIAN: Alford Highland, MD  DATE OF DISCHARGE: 03/02/2016  PRIMARY CARE PHYSICIAN: [facility]    ADMISSION DIAGNOSIS:  COPD exacerbation (HCC) [J44.1] Community acquired pneumonia, unspecified laterality [J18.9]  DISCHARGE DIAGNOSIS:  Active Problems:   COPD exacerbation (HCC)   SECONDARY DIAGNOSIS:   Past Medical History:  Diagnosis Date  . Asthma   . COPD (chronic obstructive pulmonary disease) (HCC)   . Ventral hernia     HOSPITAL COURSE:  55 year old female with a history of COPD who presents with COPD exacerbation and community acquired pneumonia.  1. Acute COPD exacerbation: Patient was started on Solu-Medrol, duo nebs and inhalers. Patient has responded quite well to this. I discharged patient has clear lung sounds and has no increased work of breathing. Patient will be discharged with antibiotic (Levaquin) as well as steroid taper.  2. Left lower lobe community acquired pneumonia: Patient was started on Rocephin and Zithromax. She will be discharged on Levaquin.  3. Ventral hernia  4. Anemia iron deficiency   DISCHARGE CONDITIONS AND DIET:   Patient is stable for discharge on regular diet  CONSULTS OBTAINED:  Treatment Team:  Thana Farr, MD  DRUG ALLERGIES:   Allergies  Allergen Reactions  . Atrovent [Ipratropium] Shortness Of Breath    DISCHARGE MEDICATIONS:   Current Discharge Medication List    START taking these medications   Details  levofloxacin (LEVAQUIN) 750 MG tablet Take 1 tablet (750 mg total) by mouth daily. Qty: 5 tablet, Refills: 0    predniSONE (DELTASONE) 10 MG tablet Take 1 tablet (10 mg total) by mouth daily with breakfast. 60 mg PO (ORAL) x 2 days 50 mg PO (ORAL)  x 2 days 40 mg PO (ORAL)  x 2 days 30 mg PO  (ORAL)  x 2 days 20 mg  PO  (ORAL) x 2 days 10 mg PO  (ORAL) x 2 days then stop Qty: 42 tablet, Refills: 0      CONTINUE these medications which have NOT CHANGED   Details  albuterol (PROVENTIL HFA;VENTOLIN HFA) 108 (90 BASE) MCG/ACT inhaler Inhale 2 puffs into the lungs every 6 (six) hours as needed for wheezing or shortness of breath. Qty: 1 Inhaler, Refills: 2    Aloe Vera 470 MG CAPS Take 470 mg by mouth at bedtime.     ALPRAZolam (XANAX) 0.5 MG tablet Take 0.5 mg by mouth at bedtime.    beclomethasone (QVAR) 80 MCG/ACT inhaler Inhale 2 puffs into the lungs 2 (two) times daily.    budesonide (PULMICORT) 0.5 MG/2ML nebulizer solution Take 2 mLs (0.5 mg total) by nebulization 2 (two) times daily as needed. Qty: 50 mL, Refills: 1    docusate sodium (COLACE) 100 MG capsule Take 100 mg by mouth daily as needed for mild constipation.    ferrous sulfate 325 (65 FE) MG tablet Take 1 tablet (325 mg total) by mouth 2 (two) times daily with a meal. Qty: 60 tablet, Refills: 1    magnesium oxide (MAG-OX) 400 MG tablet Take 400 mg by mouth at bedtime.     Multiple Vitamins-Minerals (CENTRUM SILVER PO) Take 1 tablet by mouth daily.    nicotine (NICODERM CQ - DOSED IN MG/24 HOURS) 21 mg/24hr patch Place 1 patch (21 mg total) onto the skin daily. Qty: 28 patch, Refills: 0  pantoprazole (PROTONIX) 40 MG tablet Take 40 mg by mouth daily.    Potassium 99 MG TABS Take 1 tablet by mouth daily.    sertraline (ZOLOFT) 100 MG tablet Take 100 mg by mouth daily.    tiotropium (SPIRIVA) 18 MCG inhalation capsule Place 1 capsule (18 mcg total) into inhaler and inhale daily. Qty: 30 capsule, Refills: 1              Today   CHIEF COMPLAINT:   No acute events overnight. Patient is ready to be discharged home   VITAL SIGNS:  Blood pressure 140/82, pulse 78, temperature 98.6 F (37 C), temperature source Oral, resp. rate (!) 21, height 5\' 3"  (1.6 m), weight 75.7 kg (166 lb 12.8 oz), SpO2 99 %.   REVIEW OF  SYSTEMS:  Review of Systems  Constitutional: Negative.  Negative for chills, fever and malaise/fatigue.  HENT: Negative.  Negative for ear discharge, ear pain, hearing loss, nosebleeds and sore throat.   Eyes: Negative.  Negative for blurred vision and pain.  Respiratory: Negative.  Negative for cough, hemoptysis, shortness of breath and wheezing.   Cardiovascular: Negative.  Negative for chest pain, palpitations and leg swelling.  Gastrointestinal: Negative.  Negative for abdominal pain, blood in stool, diarrhea, nausea and vomiting.  Genitourinary: Negative.  Negative for dysuria.  Musculoskeletal: Negative.  Negative for back pain.  Skin: Negative.   Neurological: Negative for dizziness, tremors, speech change, focal weakness, seizures and headaches.  Endo/Heme/Allergies: Negative.  Does not bruise/bleed easily.  Psychiatric/Behavioral: Negative.  Negative for depression, hallucinations and suicidal ideas.     PHYSICAL EXAMINATION:  GENERAL:  55 y.o.-year-old patient lying in the bed with no acute distress.  NECK:  Supple, no jugular venous distention. No thyroid enlargement, no tenderness.  LUNGS: Normal breath sounds bilaterally, no wheezing, rales,rhonchi  No use of accessory muscles of respiration.  CARDIOVASCULAR: S1, S2 normal. No murmurs, rubs, or gallops.  ABDOMEN: Soft, non-tender, non-distended. Bowel sounds present. No organomegaly or mass.  EXTREMITIES: No pedal edema, cyanosis, or clubbing.  PSYCHIATRIC: The patient is alert and oriented x 3.  SKIN: No obvious rash, lesion, or ulcer.   DATA REVIEW:   CBC  Recent Labs Lab 03/01/16 1156  WBC 19.0*  HGB 10.8*  HCT 34.4*  PLT 230    Chemistries   Recent Labs Lab 03/01/16 1156  NA 137  K 3.6  CL 99*  CO2 28  GLUCOSE 122*  BUN 22*  CREATININE 0.73  CALCIUM 9.7  AST 19  ALT 17  ALKPHOS 58  BILITOT <0.1*    Cardiac Enzymes  Recent Labs Lab 03/01/16 1156  TROPONINI <0.03    Microbiology  Results  @MICRORSLT48 @  RADIOLOGY:  Dg Chest Portable 1 View  Result Date: 03/01/2016 CLINICAL DATA:  Worsening shortness of breath today.  Headache. EXAM: PORTABLE CHEST 1 VIEW COMPARISON:  11/09/2015 and 11/08/2015. FINDINGS: 1215 hours. The heart size and mediastinal contours are stable. Previously noted probable hiatal hernia not well visualized. There is mild aortic atherosclerosis and chronic vascular congestion. There is new left basilar airspace disease with partial obscuration of the left hemidiaphragm peripherally. The right lung appears clear. There is no pneumothorax. The bones appear unremarkable. IMPRESSION: New left lower lobe airspace disease with possible adjacent small pleural effusion. This could reflect atelectasis or pneumonia. Correlate clinically. Followup PA and lateral chest X-ray is recommended in 3-4 weeks following trial of antibiotic therapy to ensure resolution and exclude underlying malignancy. Electronically Signed   By: Chrissie NoaWilliam  Purcell MoutonVeazey M.D.   On: 03/01/2016 12:23      Management plans discussed with the patient and she is in agreement. Stable for discharge home  Patient should follow up with pcp  CODE STATUS:  Code Status History    Date Active Date Inactive Code Status Order ID Comments User Context   11/08/2015 11:57 AM 11/10/2015  7:01 PM Full Code [id number]  Eugenie Norrieana G Blakeney, NP ED   04/07/2015  9:53 AM 04/09/2015  6:16 PM Full Code [id number]  Shaune PollackQing Chen, MD Inpatient    Advance Directive Documentation   Flowsheet Row Most Recent Value  Type of Advance Directive  Living will  Pre-existing out of facility DNR order (yellow form or pink MOST form)  No data  "MOST" Form in Place?  No data      TOTAL TIME TAKING CARE OF THIS PATIENT: 36 minutes.    Note: This dictation was prepared with Dragon dictation along with smaller phrase technology. Any transcriptional errors that result from this process are unintentional.  Dan Dissinger M.D on 03/02/2016 at 9:51  AM  Between 7am to 6pm - Pager - [phone] After 6pm go to [email or web address] - Social research officer, governmentpassword [email or web address]  [facility]  Office  [phone]  CC: Primary care physician; [facility]

## 2016-03-02 NOTE — Progress Notes (Signed)
PHARMACY - PHYSICIAN COMMUNICATION CRITICAL VALUE ALERT - BLOOD CULTURE IDENTIFICATION (BCID)  Results for orders placed or performed during the hospital encounter of 03/01/16  Blood Culture ID Panel (Reflexed) (Collected: 03/01/2016  2:09 PM)  Result Value Ref Range   Enterococcus species NOT DETECTED NOT DETECTED   Listeria monocytogenes NOT DETECTED NOT DETECTED   Staphylococcus species DETECTED (A) NOT DETECTED   Staphylococcus aureus NOT DETECTED NOT DETECTED   Methicillin resistance DETECTED (A) NOT DETECTED   Streptococcus species NOT DETECTED NOT DETECTED   Streptococcus agalactiae NOT DETECTED NOT DETECTED   Streptococcus pneumoniae NOT DETECTED NOT DETECTED   Streptococcus pyogenes NOT DETECTED NOT DETECTED   Acinetobacter baumannii NOT DETECTED NOT DETECTED   Enterobacteriaceae species NOT DETECTED NOT DETECTED   Enterobacter cloacae complex NOT DETECTED NOT DETECTED   Escherichia coli NOT DETECTED NOT DETECTED   Klebsiella oxytoca NOT DETECTED NOT DETECTED   Klebsiella pneumoniae NOT DETECTED NOT DETECTED   Proteus species NOT DETECTED NOT DETECTED   Serratia marcescens NOT DETECTED NOT DETECTED   Haemophilus influenzae NOT DETECTED NOT DETECTED   Neisseria meningitidis NOT DETECTED NOT DETECTED   Pseudomonas aeruginosa NOT DETECTED NOT DETECTED   Candida albicans NOT DETECTED NOT DETECTED   Candida glabrata NOT DETECTED NOT DETECTED   Candida krusei NOT DETECTED NOT DETECTED   Candida parapsilosis NOT DETECTED NOT DETECTED   Candida tropicalis NOT DETECTED NOT DETECTED    Name of physician (or Provider) Contacted: Mody  Changes to prescribed antibiotics required: patient improving on current treatment, thought to be contaminant. No changes at this time. Patient to discharge - will follow for final ID/sens.   [name] C 03/02/2016  11:10 AM

## 2016-03-03 LAB — URINE CULTURE

## 2016-03-03 NOTE — Progress Notes (Signed)
PHARMACY - PHYSICIAN COMMUNICATION CRITICAL VALUE ALERT - BLOOD CULTURE IDENTIFICATION (BCID)  Results for orders placed or performed during the hospital encounter of 03/01/16  Blood Culture ID Panel (Reflexed) (Collected: 03/01/2016  2:09 PM)  Result Value Ref Range   Enterococcus species NOT DETECTED NOT DETECTED   Listeria monocytogenes NOT DETECTED NOT DETECTED   Staphylococcus species DETECTED (A) NOT DETECTED   Staphylococcus aureus NOT DETECTED NOT DETECTED   Methicillin resistance DETECTED (A) NOT DETECTED   Streptococcus species NOT DETECTED NOT DETECTED   Streptococcus agalactiae NOT DETECTED NOT DETECTED   Streptococcus pneumoniae NOT DETECTED NOT DETECTED   Streptococcus pyogenes NOT DETECTED NOT DETECTED   Acinetobacter baumannii NOT DETECTED NOT DETECTED   Enterobacteriaceae species NOT DETECTED NOT DETECTED   Enterobacter cloacae complex NOT DETECTED NOT DETECTED   Escherichia coli NOT DETECTED NOT DETECTED   Klebsiella oxytoca NOT DETECTED NOT DETECTED   Klebsiella pneumoniae NOT DETECTED NOT DETECTED   Proteus species NOT DETECTED NOT DETECTED   Serratia marcescens NOT DETECTED NOT DETECTED   Haemophilus influenzae NOT DETECTED NOT DETECTED   Neisseria meningitidis NOT DETECTED NOT DETECTED   Pseudomonas aeruginosa NOT DETECTED NOT DETECTED   Candida albicans NOT DETECTED NOT DETECTED   Candida glabrata NOT DETECTED NOT DETECTED   Candida krusei NOT DETECTED NOT DETECTED   Candida parapsilosis NOT DETECTED NOT DETECTED   Candida tropicalis NOT DETECTED NOT DETECTED    Name of physician (or Provider) Contacted: Mody  Changes to prescribed antibiotics required: patient improving on current treatment, thought to be contaminant. No changes at this time. Patient to discharge - will follow for final ID/sens.   Dustine Bertini C 03/03/2016  1:08 PM   11/1: Blood culture follow-up - now with GPCs in 3/4 bottles from 2 separate sticks. Discussed with Dr. Juliene PinaMody, who will  contact patient to see how she is doing. Final ID & sensitivities pending, will continue to follow.   Garlon HatchetJody Antonya Leeder, PharmD Clinical Pharmacist  03/03/2016 1:09 PM

## 2016-03-06 LAB — CULTURE, BLOOD (ROUTINE X 2)

## 2016-03-06 NOTE — Progress Notes (Signed)
Blood cx staph Epi. I called patient and she is doing well. NO fevers, cough or weakness. She is on Levaquin which is sensitive to staph epi as well.

## 2016-03-21 ENCOUNTER — Encounter: Payer: Self-pay | Admitting: *Deleted

## 2016-03-21 ENCOUNTER — Inpatient Hospital Stay
Admission: EM | Admit: 2016-03-21 | Discharge: 2016-03-24 | DRG: 189 | Disposition: A | Discharge status: Home / Self Care | Payer: Medicaid Other | Attending: Internal Medicine | Admitting: Internal Medicine

## 2016-03-21 ENCOUNTER — Emergency Department: Payer: Medicaid Other

## 2016-03-21 DIAGNOSIS — J441 Chronic obstructive pulmonary disease with (acute) exacerbation: Secondary | ICD-10-CM | POA: Diagnosis present

## 2016-03-21 DIAGNOSIS — Z79899 Other long term (current) drug therapy: Secondary | ICD-10-CM | POA: Diagnosis not present

## 2016-03-21 DIAGNOSIS — J9601 Acute respiratory failure with hypoxia: Secondary | ICD-10-CM | POA: Diagnosis present

## 2016-03-21 DIAGNOSIS — K219 Gastro-esophageal reflux disease without esophagitis: Secondary | ICD-10-CM | POA: Diagnosis present

## 2016-03-21 DIAGNOSIS — F17210 Nicotine dependence, cigarettes, uncomplicated: Secondary | ICD-10-CM | POA: Diagnosis present

## 2016-03-21 DIAGNOSIS — Z7951 Long term (current) use of inhaled steroids: Secondary | ICD-10-CM

## 2016-03-21 DIAGNOSIS — J449 Chronic obstructive pulmonary disease, unspecified: Secondary | ICD-10-CM | POA: Diagnosis present

## 2016-03-21 DIAGNOSIS — F418 Other specified anxiety disorders: Secondary | ICD-10-CM | POA: Diagnosis present

## 2016-03-21 DIAGNOSIS — K449 Diaphragmatic hernia without obstruction or gangrene: Secondary | ICD-10-CM | POA: Diagnosis present

## 2016-03-21 DIAGNOSIS — D649 Anemia, unspecified: Secondary | ICD-10-CM | POA: Diagnosis present

## 2016-03-21 HISTORY — DX: Major depressive disorder, single episode, unspecified: F32.9

## 2016-03-21 HISTORY — DX: Anemia, unspecified: D64.9

## 2016-03-21 HISTORY — DX: Anxiety disorder, unspecified: F41.9

## 2016-03-21 HISTORY — DX: Depression, unspecified: F32.A

## 2016-03-21 LAB — BASIC METABOLIC PANEL
Anion gap: 7 (ref 5–15)
BUN: 21 mg/dL — ABNORMAL HIGH (ref 6–20)
CALCIUM: 9 mg/dL (ref 8.9–10.3)
CO2: 28 mmol/L (ref 22–32)
Chloride: 106 mmol/L (ref 101–111)
Creatinine, Ser: 0.88 mg/dL (ref 0.44–1.00)
Glucose, Bld: 95 mg/dL (ref 65–99)
POTASSIUM: 4.2 mmol/L (ref 3.5–5.1)
SODIUM: 141 mmol/L (ref 135–145)

## 2016-03-21 LAB — CBC
HEMATOCRIT: 29 % — AB (ref 35.0–47.0)
HEMOGLOBIN: 9.4 g/dL — AB (ref 12.0–16.0)
MCH: 27.8 pg (ref 26.0–34.0)
MCHC: 32.6 g/dL (ref 32.0–36.0)
MCV: 85.3 fL (ref 80.0–100.0)
Platelets: 155 10*3/uL (ref 150–440)
RBC: 3.4 MIL/uL — ABNORMAL LOW (ref 3.80–5.20)
RDW: 17.5 % — ABNORMAL HIGH (ref 11.5–14.5)
WBC: 5.7 10*3/uL (ref 3.6–11.0)

## 2016-03-21 LAB — TROPONIN I

## 2016-03-21 LAB — FERRITIN: Ferritin: 43 ng/mL (ref 11–307)

## 2016-03-21 MED ORDER — POTASSIUM 99 MG PO TABS: 1.0000 | ORAL_TABLET | Freq: Every day | ORAL | Status: DC

## 2016-03-21 MED ORDER — ALBUTEROL SULFATE (2.5 MG/3ML) 0.083% IN NEBU
2.5000 mg | INHALATION_SOLUTION | Freq: Once | RESPIRATORY_TRACT | Status: AC
  Administered 2016-03-21: 2.5 mg via RESPIRATORY_TRACT
  Filled 2016-03-21: qty 3

## 2016-03-21 MED ORDER — MAGNESIUM OXIDE 400 (241.3 MG) MG PO TABS
400.0000 mg | ORAL_TABLET | Freq: Every day | ORAL | Status: DC
  Administered 2016-03-21 – 2016-03-23 (×3): 400 mg via ORAL
  Filled 2016-03-21 (×3): qty 1

## 2016-03-21 MED ORDER — DEXTROSE 5 % IV SOLN
500.0000 mg | Freq: Once | INTRAVENOUS | Status: DC
  Filled 2016-03-21: qty 500

## 2016-03-21 MED ORDER — METHYLPREDNISOLONE SODIUM SUCC 125 MG IJ SOLR
60.0000 mg | Freq: Three times a day (TID) | INTRAMUSCULAR | Status: DC
  Administered 2016-03-21 – 2016-03-22 (×4): 60 mg via INTRAVENOUS
  Filled 2016-03-21 (×4): qty 2

## 2016-03-21 MED ORDER — PANTOPRAZOLE SODIUM 40 MG PO TBEC
40.0000 mg | DELAYED_RELEASE_TABLET | Freq: Every day | ORAL | Status: DC
  Administered 2016-03-21 – 2016-03-24 (×4): 40 mg via ORAL
  Filled 2016-03-21 (×4): qty 1

## 2016-03-21 MED ORDER — SERTRALINE HCL 100 MG PO TABS
100.0000 mg | ORAL_TABLET | Freq: Every day | ORAL | Status: DC
  Administered 2016-03-21 – 2016-03-24 (×4): 100 mg via ORAL
  Filled 2016-03-21 (×4): qty 1

## 2016-03-21 MED ORDER — NICOTINE 14 MG/24HR TD PT24
14.0000 mg | MEDICATED_PATCH | Freq: Every day | TRANSDERMAL | Status: DC
  Administered 2016-03-21 – 2016-03-24 (×4): 14 mg via TRANSDERMAL
  Filled 2016-03-21 (×4): qty 1

## 2016-03-21 MED ORDER — ENOXAPARIN SODIUM 40 MG/0.4ML ~~LOC~~ SOLN
40.0000 mg | SUBCUTANEOUS | Status: DC
  Administered 2016-03-21 – 2016-03-23 (×3): 40 mg via SUBCUTANEOUS
  Filled 2016-03-21 (×3): qty 0.4

## 2016-03-21 MED ORDER — ACETAMINOPHEN 650 MG RE SUPP: 650.0000 mg | Freq: Four times a day (QID) | RECTAL | Status: DC | PRN

## 2016-03-21 MED ORDER — ACETAMINOPHEN 325 MG PO TABS
650.0000 mg | ORAL_TABLET | Freq: Four times a day (QID) | ORAL | Status: DC | PRN
  Administered 2016-03-21 – 2016-03-22 (×2): 650 mg via ORAL
  Filled 2016-03-21 (×2): qty 2

## 2016-03-21 MED ORDER — ALPRAZOLAM 0.5 MG PO TABS
0.5000 mg | ORAL_TABLET | Freq: Every day | ORAL | Status: DC
  Administered 2016-03-21 – 2016-03-23 (×3): 0.5 mg via ORAL
  Filled 2016-03-21 (×3): qty 1

## 2016-03-21 MED ORDER — DOCUSATE SODIUM 100 MG PO CAPS: 100.0000 mg | ORAL_CAPSULE | Freq: Every day | ORAL | Status: DC | PRN

## 2016-03-21 MED ORDER — TIOTROPIUM BROMIDE MONOHYDRATE 18 MCG IN CAPS
18.0000 ug | ORAL_CAPSULE | Freq: Every day | RESPIRATORY_TRACT | Status: DC
  Administered 2016-03-21 – 2016-03-24 (×4): 18 ug via RESPIRATORY_TRACT
  Filled 2016-03-21: qty 5

## 2016-03-21 MED ORDER — BUDESONIDE 0.25 MG/2ML IN SUSP
0.2500 mg | Freq: Two times a day (BID) | RESPIRATORY_TRACT | Status: DC
  Administered 2016-03-21 – 2016-03-22 (×2): 0.25 mg via RESPIRATORY_TRACT
  Filled 2016-03-21 (×3): qty 2

## 2016-03-21 MED ORDER — LEVOFLOXACIN 500 MG PO TABS: 500.0000 mg | ORAL_TABLET | Freq: Every day | ORAL | Status: DC

## 2016-03-21 MED ORDER — OXYCODONE HCL 5 MG PO TABS
5.0000 mg | ORAL_TABLET | Freq: Four times a day (QID) | ORAL | Status: DC | PRN
  Administered 2016-03-21 – 2016-03-24 (×10): 5 mg via ORAL
  Filled 2016-03-21 (×10): qty 1

## 2016-03-21 MED ORDER — HYDROCODONE-ACETAMINOPHEN 5-325 MG PO TABS
2.0000 | ORAL_TABLET | Freq: Once | ORAL | Status: AC
  Administered 2016-03-21: 2 via ORAL
  Filled 2016-03-21: qty 2

## 2016-03-21 MED ORDER — FERROUS SULFATE 325 (65 FE) MG PO TABS
325.0000 mg | ORAL_TABLET | Freq: Two times a day (BID) | ORAL | Status: DC
  Administered 2016-03-21 – 2016-03-24 (×7): 325 mg via ORAL
  Filled 2016-03-21 (×7): qty 1

## 2016-03-21 MED ORDER — ALBUTEROL SULFATE (2.5 MG/3ML) 0.083% IN NEBU
2.5000 mg | INHALATION_SOLUTION | Freq: Four times a day (QID) | RESPIRATORY_TRACT | Status: DC
  Administered 2016-03-21 – 2016-03-22 (×5): 2.5 mg via RESPIRATORY_TRACT
  Filled 2016-03-21 (×5): qty 3

## 2016-03-21 MED ORDER — DOXYCYCLINE HYCLATE 100 MG PO TABS
100.0000 mg | ORAL_TABLET | Freq: Two times a day (BID) | ORAL | Status: DC
  Administered 2016-03-21 – 2016-03-24 (×7): 100 mg via ORAL
  Filled 2016-03-21 (×7): qty 1

## 2016-03-21 NOTE — ED Triage Notes (Signed)
Pt presents to ED profoundly short of breath, wheezing throughout lung fields, hx of asthma. Pt has been trying to manage asthma at home, has run out of her rescue MDI'S. Pt has new c/o jaw pain on R side starting noon yesterday. Pt c/o headache since 0200 this morning.

## 2016-03-21 NOTE — Progress Notes (Signed)
A&O patient, c/o headache with improvement reported with PRN Oxycodone. Inspiratory and expiratory wheezing noted, pt continues with 2 L oxygen via [place]. Moderate fall risk.

## 2016-03-21 NOTE — ED Notes (Signed)
RA stat 89%

## 2016-03-21 NOTE — ED Provider Notes (Signed)
Signout from Dr. York CeriseForbach in this 55 year old female presenting with shortness of breath with a history of COPD on home O2 at night. Plan is to reassess after nebs. Patient has already received Solu-Medrol.  Physical Exam  BP (!) 110/58 (BP Location: Right Arm)   Pulse 76   Temp 97.8 F (36.6 C) (Oral)   Resp 18   Ht 5\' 3"  (1.6 m)   Wt 162 lb (73.5 kg)   SpO2 96%   BMI 28.70 kg/m  ----------------------------------------- 8:02 AM on 03/21/2016 -----------------------------------------   Physical Exam Patient with tachypnea and labored respirations. Very mild supra-sternal retractions. Wheezing throughout with a prolonged expiratory phase and decreased air movement. She is also having a productive cough with white sputum while I'm in the room. ED Course  Procedures  MDM Patient with multiple neb treatments and still with respiratory distress and wheezing. To admit to the hospital. This is a patient. Signed out to Dr. Hilton SinclairWeiting.        Myrna Blazeravid Matthew Yuleni Burich, MD 03/21/16 [id number]

## 2016-03-21 NOTE — ED Provider Notes (Signed)
[facility] Emergency Department Provider Note  ____________________________________________   First MD Initiated Contact with Patient 03/21/16 [id number]     (approximate)  I have reviewed the triage vital signs and the nursing notes.   HISTORY  Chief Complaint Shortness of Breath and Jaw Pain    HPI Julia [name] is a 55 y.o. female with a history of severe COPD and admission about 3 weeks ago for pneumonia in the setting of a COPD exacerbation who presents by EMS for worsening shortness of breath 2 days.  She states that her usual breathing treatments or not helping and that her symptoms became severe tonight, gradually worsening over time.  She has had worsening wheezing throughout and increased work of breathing.  She had 2 albuterol nebulizer treatments as well as Solu-Medrol 125 mg administered by EMS prior to arrival at the hospital.  She has an allergy to ipratropium so she does not get DuoNebs.  She denies fever/chills, chest pain, abdominal pain, nausea, vomiting, diaphoresis, dysuria.  She describes her symptoms as severe initially but they are much better at the time that I evaluated her after the 2 breathing treatments in route to the hospital.  Past Medical History:  Diagnosis Date  . Asthma   . COPD (chronic obstructive pulmonary disease) (HCC)   . Ventral hernia     Patient Active Problem List   Diagnosis Date Noted  . Acute respiratory failure (HCC) 11/08/2015  . COPD exacerbation (HCC) 04/07/2015    Past Surgical History:  Procedure Laterality Date  . COLON SURGERY    . EAR TUBE REMOVAL    . FRACTURE SURGERY    . HERNIA REPAIR    . TUBAL LIGATION      Prior to Admission medications   Medication Sig Start Date End Date Taking? Authorizing Provider  albuterol (PROVENTIL HFA;VENTOLIN HFA) 108 (90 BASE) MCG/ACT inhaler Inhale 2 puffs into the lungs every 6 (six) hours as needed for wheezing or shortness of breath. 04/09/15   Julia SaranSital  Mody, MD  Aloe Vera 470 MG CAPS Take 470 mg by mouth at bedtime.     Historical Provider, MD  ALPRAZolam Prudy Feeler(XANAX) 0.5 MG tablet Take 0.5 mg by mouth at bedtime.    Historical Provider, MD  beclomethasone (QVAR) 80 MCG/ACT inhaler Inhale 2 puffs into the lungs 2 (two) times daily. 06/05/15 06/04/16  Historical Provider, MD  budesonide (PULMICORT) 0.5 MG/2ML nebulizer solution Take 2 mLs (0.5 mg total) by nebulization 2 (two) times daily as needed. 11/10/15   Julia DillingVaibhavkumar Vachhani, MD  docusate sodium (COLACE) 100 MG capsule Take 100 mg by mouth daily as needed for mild constipation.    Historical Provider, MD  ferrous sulfate 325 (65 FE) MG tablet Take 1 tablet (325 mg total) by mouth 2 (two) times daily with a meal. 11/10/15   Julia DillingVaibhavkumar Vachhani, MD  levofloxacin (LEVAQUIN) 750 MG tablet Take 1 tablet (750 mg total) by mouth daily. 03/02/16   Julia SaranSital Mody, MD  magnesium oxide (MAG-OX) 400 MG tablet Take 400 mg by mouth at bedtime.     Historical Provider, MD  Multiple Vitamins-Minerals (CENTRUM SILVER PO) Take 1 tablet by mouth daily.    Historical Provider, MD  nicotine (NICODERM CQ - DOSED IN MG/24 HOURS) 21 mg/24hr patch Place 1 patch (21 mg total) onto the skin daily. 04/09/15   Julia SaranSital Mody, MD  pantoprazole (PROTONIX) 40 MG tablet Take 40 mg by mouth daily.    Historical Provider, MD  Potassium 99 MG  TABS Take 1 tablet by mouth daily.    Historical Provider, MD  predniSONE (DELTASONE) 10 MG tablet Take 1 tablet (10 mg total) by mouth daily with breakfast. 60 mg PO (ORAL) x 2 days 50 mg PO (ORAL)  x 2 days 40 mg PO (ORAL)  x 2 days 30 mg PO  (ORAL)  x 2 days 20 mg PO  (ORAL) x 2 days 10 mg PO  (ORAL) x 2 days then stop 03/02/16   Julia Saran, MD  sertraline (ZOLOFT) 100 MG tablet Take 100 mg by mouth daily.    Historical Provider, MD  tiotropium (SPIRIVA) 18 MCG inhalation capsule Place 1 capsule (18 mcg total) into inhaler and inhale daily. 11/10/15   Julia Dilling, MD     Allergies Atrovent [ipratropium]  Family History  Problem Relation Age of Onset  . Sarcoidosis Mother   . Alcohol abuse Father     Social History Social History  Substance Use Topics  . Smoking status: Current Every Day Smoker    Packs/day: 0.20    Types: Cigarettes  . Smokeless tobacco: Never Used  . Alcohol use No    Review of Systems Constitutional: No fever/chills Eyes: No visual changes. ENT: No sore throat. Cardiovascular: Denies chest pain. Respiratory: +shortness of breath. Gastrointestinal: No abdominal pain.  No nausea, no vomiting.  No diarrhea.  No constipation. Genitourinary: Negative for dysuria. Musculoskeletal: Negative for back pain. Skin: Negative for rash. Neurological: Negative for headaches, focal weakness or numbness.  10-point ROS otherwise negative.  ____________________________________________   PHYSICAL EXAM:  VITAL SIGNS: ED Triage Vitals  Enc Vitals Group     BP 03/21/16 0610 107/71     Pulse Rate 03/21/16 0610 72     Resp 03/21/16 0610 20     Temp 03/21/16 0610 97.8 F (36.6 C)     Temp Source 03/21/16 0610 Oral     SpO2 03/21/16 0610 94 %     Weight 03/21/16 0612 162 lb (73.5 kg)     Height 03/21/16 0612 5\' 3"  (1.6 m)     Head Circumference --      Peak Flow --      Pain Score 03/21/16 0612 9     Pain Loc --      Pain Edu? --      Excl. in GC? --     Constitutional: Alert and oriented. Chronic respiratory illness, but in no acute distress at this time Eyes: Conjunctivae are normal. PERRL. EOMI. Head: Atraumatic. Nose: No congestion/rhinnorhea. Mouth/Throat: Mucous membranes are moist.  Oropharynx non-erythematous. Neck: No stridor.  No meningeal signs.   Cardiovascular: Normal rate, regular rhythm. Good peripheral circulation. Grossly normal heart sounds. Respiratory: Normal respiratory effort but with occasional forceful inspirations.  Lungs with extensive expiratory wheezing throughout Gastrointestinal: Soft and  nontender. No distention.  Musculoskeletal: No lower extremity tenderness nor edema. No gross deformities of extremities. Neurologic:  Normal speech and language. No gross focal neurologic deficits are appreciated.  Skin:  Skin is warm, dry and intact. No rash noted.   ____________________________________________   LABS (all labs ordered are listed, but only abnormal results are displayed)  Labs Reviewed  BASIC METABOLIC PANEL - Abnormal; Notable for the following:       Result Value   BUN 21 (*)    All other components within normal limits  CBC - Abnormal; Notable for the following:    RBC 3.40 (*)    Hemoglobin 9.4 (*)    HCT 29.0 (*)  RDW 17.5 (*)    All other components within normal limits  TROPONIN I   ____________________________________________  EKG  ED ECG REPORT I, Havyn Ramo, the attending physician, personally viewed and interpreted this ECG.  Date: 03/21/2016 EKG Time: 06:09 Rate: 72 Rhythm: normal sinus rhythm QRS Axis: normal Intervals: normal ST/T Wave abnormalities: normal Conduction Disturbances: none Narrative Interpretation: unremarkable  ____________________________________________  RADIOLOGY   Dg Chest 2 View  Result Date: 03/21/2016 CLINICAL DATA:  Shortness of breath EXAM: CHEST  2 VIEW COMPARISON:  03/01/2016 FINDINGS: Normal heart size and aortic contours. Small hiatal hernia. Chronic hyperinflation. Reticular nodular opacities at the bases that is increased from July 2017 baseline. No focal consolidation. No edema, effusion, or pneumothorax. Remote lower thoracic compression fracture. IMPRESSION: COPD. Bronchitic markings are above prior baseline and there could be acute bronchitis. No focal pneumonia. Electronically Signed   By: Marnee SpringJonathon  Watts M.D.   On: 03/21/2016 07:16    ____________________________________________   PROCEDURES  Procedure(s) performed:   Procedures   Critical Care performed:  No ____________________________________________   INITIAL IMPRESSION / ASSESSMENT AND PLAN / ED COURSE  Pertinent labs & imaging results that were available during my care of the patient were reviewed by me and considered in my medical decision making (see chart for details).  The patient severe chronic lung disease and is apparently having an exacerbation.  However she feels much better after getting some breathing treatments prior to arrival.  We will evaluate with a chest x-ray and standard labs but will go mostly on her clinical status and how she is feeling.  She already stated that she would prefer to go home if possible and does not want to stay in the hospital if it is not necessary.  If her breathing has improved and she has no sign of acute or emergent medical condition, which she would be able to tolerate home with outpatient follow-up.   Clinical Course as of Mar 21 729  Wynelle LinkSun Mar 21, 2016  [id number] Labs reassuring, CXR without evidence of PNA.  Transferring ED care to Dr. Pershing ProudSchaevitz to reassess after additional albuterol and reassess patient's ability for d/c vs inpatient treatment. DG Chest 2 View [CF]    Clinical Course User Index [CF] Julia Roseory Millee Denise, MD    ____________________________________________  FINAL CLINICAL IMPRESSION(S) / ED DIAGNOSES  Final diagnoses:  None     MEDICATIONS GIVEN DURING THIS VISIT:  Medications  albuterol (PROVENTIL) (2.5 MG/3ML) 0.083% nebulizer solution 2.5 mg (2.5 mg Nebulization Given 03/21/16 0725)  HYDROcodone-acetaminophen (NORCO/VICODIN) 5-325 MG per tablet 2 tablet (2 tablets Oral Given 03/21/16 0725)     NEW OUTPATIENT MEDICATIONS STARTED DURING THIS VISIT:  New Prescriptions   No medications on file    Modified Medications   No medications on file    Discontinued Medications   No medications on file     Note:  This document was prepared using Dragon voice recognition software and may include unintentional dictation  errors.    Julia Roseory Marl Seago, MD 03/21/16 0730

## 2016-03-21 NOTE — H&P (Signed)
[facility] [place] at [facility]   PATIENT NAME: Julia [name]    MR#:  [id number]  DATE OF BIRTH:  [birth year]  DATE OF ADMISSION:  03/21/2016  PRIMARY CARE PHYSICIAN: [facility]   REQUESTING/REFERRING PHYSICIAN: Dr Ardath Sax  CHIEF COMPLAINT:   Chief Complaint  Patient presents with  . Shortness of Breath  . Jaw Pain    HISTORY OF PRESENT ILLNESS:  Julia [name]  is a 55 y.o. female with a known history of COPD presents with shortness of breath. She states this is been going on for a few days. Unable to control at home. She is coughing up whitish phlegm when she is bringing up but most the time it's nonproductive. She does have a wheeze. She did have fever or chills and sweats at home. She does have a dull ache on the left lower chest. In the ER, she was found to have COPD exacerbation with increased work of breathing and a borderline pulse ox ranging between 88% to 93%. She was unable to speak in complete sentences.  PAST MEDICAL HISTORY:   Past Medical History:  Diagnosis Date  . Anemia   . Anxiety   . Asthma   . COPD (chronic obstructive pulmonary disease) (HCC)   . Depression   . Ventral hernia     PAST SURGICAL HISTORY:   Past Surgical History:  Procedure Laterality Date  . COLON SURGERY    . EAR TUBE REMOVAL    . FRACTURE SURGERY    . HERNIA REPAIR    . TUBAL LIGATION      SOCIAL HISTORY:   Social History  Substance Use Topics  . Smoking status: Current Every Day Smoker    Packs/day: 0.50    Types: Cigarettes  . Smokeless tobacco: Never Used  . Alcohol use No    FAMILY HISTORY:   Family History  Problem Relation Age of Onset  . Sarcoidosis Mother   . Alcohol abuse Father     DRUG ALLERGIES:   Allergies  Allergen Reactions  . Atrovent [Ipratropium] Shortness Of Breath    REVIEW OF SYSTEMS:  CONSTITUTIONAL: Positive for fever, chills and sweats. Positive for fatigue. Positive for weight  gain. EYES: No blurred or double vision.  EARS, NOSE, AND THROAT: No tinnitus or ear pain. Positive for sore throat. RESPIRATORY: Positive for cough, shortness of breath, and wheezing. No hemoptysis.  CARDIOVASCULAR: Dull left-sided chest pain. No orthopnea, edema.  GASTROINTESTINAL: Positive for nausea, vomiting the other day. No diarrhea or abdominal pain. No blood in bowel movements GENITOURINARY: No dysuria, hematuria.  ENDOCRINE: No polyuria, nocturia,  HEMATOLOGY: No anemia, easy bruising or bleeding SKIN: No rash or lesion. Positive for itching right anterior arm MUSCULOSKELETAL: No joint pain or arthritis.   NEUROLOGIC: No tingling, numbness, weakness.  PSYCHIATRY: Positive for anxiety and depression.   MEDICATIONS AT HOME:   Prior to Admission medications   Medication Sig Start Date End Date Taking? Authorizing Provider  albuterol (PROVENTIL HFA;VENTOLIN HFA) 108 (90 BASE) MCG/ACT inhaler Inhale 2 puffs into the lungs every 6 (six) hours as needed for wheezing or shortness of breath. Patient not taking: Reported on 03/21/2016 04/09/15   Adrian Saran, MD  Aloe Vera 470 MG CAPS Take 470 mg by mouth at bedtime.     Historical Provider, MD  ALPRAZolam Prudy Feeler) 0.5 MG tablet Take 0.5 mg by mouth at bedtime.    Historical Provider, MD  beclomethasone (QVAR) 80 MCG/ACT inhaler Inhale 2 puffs into the  lungs 2 (two) times daily. 06/05/15 06/04/16  Historical Provider, MD  budesonide (PULMICORT) 0.5 MG/2ML nebulizer solution Take 2 mLs (0.5 mg total) by nebulization 2 (two) times daily as needed. 11/10/15   Altamese DillingVaibhavkumar Vachhani, MD  docusate sodium (COLACE) 100 MG capsule Take 100 mg by mouth daily as needed for mild constipation.    Historical Provider, MD  ferrous sulfate 325 (65 FE) MG tablet Take 1 tablet (325 mg total) by mouth 2 (two) times daily with a meal. 11/10/15   Altamese DillingVaibhavkumar Vachhani, MD  levofloxacin (LEVAQUIN) 750 MG tablet Take 1 tablet (750 mg total) by mouth daily. 03/02/16   Adrian SaranSital  Mody, MD  magnesium oxide (MAG-OX) 400 MG tablet Take 400 mg by mouth at bedtime.     Historical Provider, MD  Multiple Vitamins-Minerals (CENTRUM SILVER PO) Take 1 tablet by mouth daily.    Historical Provider, MD  nicotine (NICODERM CQ - DOSED IN MG/24 HOURS) 21 mg/24hr patch Place 1 patch (21 mg total) onto the skin daily. 04/09/15   Adrian SaranSital Mody, MD  pantoprazole (PROTONIX) 40 MG tablet Take 40 mg by mouth daily.    Historical Provider, MD  Potassium 99 MG TABS Take 1 tablet by mouth daily.    Historical Provider, MD  predniSONE (DELTASONE) 10 MG tablet Take 1 tablet (10 mg total) by mouth daily with breakfast. 60 mg PO (ORAL) x 2 days 50 mg PO (ORAL)  x 2 days 40 mg PO (ORAL)  x 2 days 30 mg PO  (ORAL)  x 2 days 20 mg PO  (ORAL) x 2 days 10 mg PO  (ORAL) x 2 days then stop 03/02/16   Adrian SaranSital Mody, MD  sertraline (ZOLOFT) 100 MG tablet Take 100 mg by mouth daily.    Historical Provider, MD  tiotropium (SPIRIVA) 18 MCG inhalation capsule Place 1 capsule (18 mcg total) into inhaler and inhale daily. 11/10/15   Altamese DillingVaibhavkumar Vachhani, MD      VITAL SIGNS:  Blood pressure (!) 110/58, pulse 76, temperature 97.8 F (36.6 C), temperature source Oral, resp. rate 18, height 5\' 3"  (1.6 m), weight 73.5 kg (162 lb), SpO2 96 %.  PHYSICAL EXAMINATION:  GENERAL:  55 y.o.-year-old patient lying in the bed with Slight respiratory distress using accessory muscles to breathe EYES: Pupils equal, round, reactive to light and accommodation. No scleral icterus. Extraocular muscles intact.  HEENT: Head atraumatic, normocephalic. Oropharynx and nasopharynx clear. Tympanic membrane erythematous on the right and looks like chronic changes there also. Left tympanic membrane blocked by wax NECK:  Supple, no jugular venous distention. No thyroid enlargement, no tenderness.  LUNGS: Decreased breath sounds bilaterally, positive wheezing throughout entire lung field. No rales,rhonchi or crepitation. Positive use of accessory  muscles of respiration.  CARDIOVASCULAR: S1, S2 normal. No murmurs, rubs, or gallops.  ABDOMEN: Soft, nontender, nondistended. Bowel sounds present. No organomegaly or mass.  EXTREMITIES: No pedal edema, cyanosis, or clubbing.  NEUROLOGIC: Cranial nerves II through XII are intact. Muscle strength 5/5 in all extremities. Sensation intact. Gait not checked.  PSYCHIATRIC: The patient is alert and oriented x 3.  SKIN: No rash, lesion, or ulcer.   LABORATORY PANEL:   CBC  Recent Labs Lab 03/21/16 0626  WBC 5.7  HGB 9.4*  HCT 29.0*  PLT 155   ------------------------------------------------------------------------------------------------------------------  Chemistries   Recent Labs Lab 03/21/16 0626  NA 141  K 4.2  CL 106  CO2 28  GLUCOSE 95  BUN 21*  CREATININE 0.88  CALCIUM 9.0   ------------------------------------------------------------------------------------------------------------------  Cardiac Enzymes  Recent Labs Lab 03/21/16 0626  TROPONINI <0.03   ------------------------------------------------------------------------------------------------------------------  RADIOLOGY:  Dg Chest 2 View  Result Date: 03/21/2016 CLINICAL DATA:  Shortness of breath EXAM: CHEST  2 VIEW COMPARISON:  03/01/2016 FINDINGS: Normal heart size and aortic contours. Small hiatal hernia. Chronic hyperinflation. Reticular nodular opacities at the bases that is increased from July 2017 baseline. No focal consolidation. No edema, effusion, or pneumothorax. Remote lower thoracic compression fracture. IMPRESSION: COPD. Bronchitic markings are above prior baseline and there could be acute bronchitis. No focal pneumonia. Electronically Signed   By: Marnee SpringJonathon  Watts M.D.   On: 03/21/2016 07:16    EKG:   Sinus rhythm 72 bpm  IMPRESSION AND PLAN:   1. COPD exacerbation. Patient has increased work of breathing using accessory muscles and cannot speak in complete sentences. Admit to the  hospital for IV Solu-Medrol, oral doxycycline, budesonide and albuterol nebulizers. Patient also on Spiriva. 2. Acute respiratory failure with hypoxia pulse ox ranging between 88 and 93% on room air. Oxygen supplementation ordered. 3. Anxiety depression on Zoloft 4. Anemia unspecified continue ferrous sulfate. Check a ferritin level. 5. GERD on Protonix   All the records are reviewed and case discussed with ED provider. Management plans discussed with the patient, family and they are in agreement.  CODE STATUS: Full code  TOTAL TIME TAKING CARE OF THIS PATIENT: 50 minutes.    Alford HighlandWIETING, Kendall Arnell M.D on 03/21/2016 at 8:29 AM  Between 7am to 6pm - Pager - [phone]  After 6pm call admission pager [phone]  [facility] Office  [phone]  CC: [facility] physician; [facility]

## 2016-03-21 NOTE — Care Management (Signed)
Informed patient to be admitted for exac of copd. EMS administered 2 treatments and IV Solumedrol in route.  Patient has received one albuterol neb in the ED.  She is using accessory muscles for respirations and speaking in broken sentences after this intervention.  Also after these treatments,  attending obtained a room air sats and lowest was 88.  she has no payor source and "has been meaning to apply for medicaid."  [facility]  says she is followed by charity care but receives no help with her medications.  Says she "probably needs to reapply.'  Also says that [facility], but it does not sound like she has completed an application.  Unable to verify this over the weekend. Says last time she was here one year ago (she was just discharge in October 2017).  Her chronic home 02 is provided under charity care by [facility].  This CM worked with patient in July 2017 and at that time, found that the order was written for continuous but patient only uses it at night.  She confirms that she has portable tanks for travel. She says she can not afford her rescue inhalers but unable to remember the name of the inhaler.  Has a home nebulizer machine

## 2016-03-22 LAB — BASIC METABOLIC PANEL
ANION GAP: 8 (ref 5–15)
BUN: 24 mg/dL — ABNORMAL HIGH (ref 6–20)
CALCIUM: 9.5 mg/dL (ref 8.9–10.3)
CO2: 29 mmol/L (ref 22–32)
CREATININE: 0.71 mg/dL (ref 0.44–1.00)
Chloride: 100 mmol/L — ABNORMAL LOW (ref 101–111)
Glucose, Bld: 224 mg/dL — ABNORMAL HIGH (ref 65–99)
Potassium: 4.5 mmol/L (ref 3.5–5.1)
Sodium: 137 mmol/L (ref 135–145)

## 2016-03-22 LAB — CBC
HCT: 30.9 % — ABNORMAL LOW (ref 35.0–47.0)
Hemoglobin: 10.1 g/dL — ABNORMAL LOW (ref 12.0–16.0)
MCH: 27.9 pg (ref 26.0–34.0)
MCHC: 32.6 g/dL (ref 32.0–36.0)
MCV: 85.6 fL (ref 80.0–100.0)
PLATELETS: 175 10*3/uL (ref 150–440)
RBC: 3.6 MIL/uL — AB (ref 3.80–5.20)
RDW: 17.3 % — ABNORMAL HIGH (ref 11.5–14.5)
WBC: 6.6 10*3/uL (ref 3.6–11.0)

## 2016-03-22 LAB — EXPECTORATED SPUTUM ASSESSMENT W REFEX TO RESP CULTURE: SPECIAL REQUESTS: NORMAL

## 2016-03-22 LAB — EXPECTORATED SPUTUM ASSESSMENT W GRAM STAIN, RFLX TO RESP C

## 2016-03-22 MED ORDER — ACETYLCYSTEINE 20 % IN SOLN
4.0000 mL | Freq: Two times a day (BID) | RESPIRATORY_TRACT | Status: DC
  Administered 2016-03-22 – 2016-03-24 (×4): 4 mL via RESPIRATORY_TRACT
  Filled 2016-03-22 (×5): qty 4

## 2016-03-22 MED ORDER — MOMETASONE FURO-FORMOTEROL FUM 200-5 MCG/ACT IN AERO
2.0000 | INHALATION_SPRAY | Freq: Two times a day (BID) | RESPIRATORY_TRACT | Status: DC
  Administered 2016-03-22 – 2016-03-24 (×4): 2 via RESPIRATORY_TRACT
  Filled 2016-03-22: qty 8.8

## 2016-03-22 MED ORDER — ALBUTEROL SULFATE (2.5 MG/3ML) 0.083% IN NEBU
2.5000 mg | INHALATION_SOLUTION | Freq: Four times a day (QID) | RESPIRATORY_TRACT | Status: DC | PRN
  Administered 2016-03-22 – 2016-03-24 (×5): 2.5 mg via RESPIRATORY_TRACT
  Filled 2016-03-22 (×5): qty 3

## 2016-03-22 MED ORDER — ACETYLCYSTEINE 20 % IN SOLN: 4.0000 mL | Freq: Two times a day (BID) | RESPIRATORY_TRACT | Status: DC

## 2016-03-22 MED ORDER — METHYLPREDNISOLONE SODIUM SUCC 125 MG IJ SOLR
60.0000 mg | Freq: Every day | INTRAMUSCULAR | Status: DC
  Administered 2016-03-23 – 2016-03-24 (×2): 60 mg via INTRAVENOUS
  Filled 2016-03-22 (×2): qty 2

## 2016-03-22 MED ORDER — GUAIFENESIN ER 600 MG PO TB12
600.0000 mg | ORAL_TABLET | Freq: Two times a day (BID) | ORAL | Status: DC
  Administered 2016-03-22 – 2016-03-24 (×5): 600 mg via ORAL
  Filled 2016-03-22 (×5): qty 1

## 2016-03-22 NOTE — Progress Notes (Signed)
CH provided conversation, support, and prayer. [facility] and would welcome follow-up.

## 2016-03-22 NOTE — Progress Notes (Signed)
[facility] - [place] at [facility]                                                                                                                                                                                  Patient Demographics   Julia [name], is a 55 y.o. female, DOB - 1960/12/09, ZOX:[id number]  Admit date - 03/21/2016   Admitting Physician Alford Highland, MD  Outpatient Primary MD for the patient is [facility]   LOS - 1  Subjective: Patient admitted with shortness of breath and cough continues to complain of productive cough. And wheezing. Some improvement in her shortness of breath    Review of Systems:   CONSTITUTIONAL: No documented fever. No fatigue, weakness. No weight gain, no weight loss.  EYES: No blurry or double vision.  ENT: No tinnitus. No postnasal drip. No redness of the oropharynx.  RESPIRATORY: Positive cough, positive wheeze, no hemoptysis. Positive dyspnea.  CARDIOVASCULAR: No chest pain. No orthopnea. No palpitations. No syncope.  GASTROINTESTINAL: No nausea, no vomiting or diarrhea. No abdominal pain. No melena or hematochezia.  GENITOURINARY: No dysuria or hematuria.  ENDOCRINE: No polyuria or nocturia. No heat or cold intolerance.  HEMATOLOGY: No anemia. No bruising. No bleeding.  INTEGUMENTARY: No rashes. No lesions.  MUSCULOSKELETAL: No arthritis. No swelling. No gout.  NEUROLOGIC: No numbness, tingling, or ataxia. No seizure-type activity.  PSYCHIATRIC: No anxiety. No insomnia. No ADD.    Vitals:   Vitals:   03/21/16 2054 03/22/16 0100 03/22/16 0357 03/22/16 0758  BP:   134/79 127/70  Pulse:   74 64  Resp:   18 20  Temp:   97.8 F (36.6 C) 97.5 F (36.4 C)  TempSrc:   Oral Oral  SpO2: 95% 97% 98% 96%  Weight:      Height:        Wt Readings from Last 3 Encounters:  03/21/16 162 lb (73.5 kg)  03/01/16 166 lb 12.8 oz (75.7 kg)  11/10/15 152 lb 5.4 oz (69.1 kg)     Intake/Output Summary (Last 24 hours)  at 03/22/16 1357 Last data filed at 03/22/16 1029  Gross per 24 hour  Intake              480 ml  Output                0 ml  Net              480 ml    Physical Exam:   GENERAL: Pleasant-appearing in no apparent distress.  HEAD, EYES, EARS, NOSE AND THROAT: Atraumatic, normocephalic. Extraocular muscles are intact. Pupils equal and reactive to light. Sclerae anicteric. No  conjunctival injection. No oro-pharyngeal erythema.  NECK: Supple. There is no jugular venous distention. No bruits, no lymphadenopathy, no thyromegaly.  HEART: Regular rate and rhythm,. No murmurs, no rubs, no clicks.  LUNGS: Bilateral wheezing throughout both lung. No rales or rhonchi. No wheezes.  ABDOMEN: Soft, flat, nontender, nondistended. Has good bowel sounds. No hepatosplenomegaly appreciated.  EXTREMITIES: No evidence of any cyanosis, clubbing, or peripheral edema.  +2 pedal and radial pulses bilaterally.  NEUROLOGIC: The patient is alert, awake, and oriented x3 with no focal motor or sensory deficits appreciated bilaterally.  SKIN: Moist and warm with no rashes appreciated.  Psych: Not anxious, depressed LN: No inguinal LN enlargement    Antibiotics   Anti-infectives    Start     Dose/Rate Route Frequency Ordered Stop   03/21/16 1000  levofloxacin (LEVAQUIN) tablet 500 mg  Status:  Discontinued     500 mg Oral Daily 03/21/16 0823 03/21/16 0827   03/21/16 1000  doxycycline (VIBRA-TABS) tablet 100 mg     100 mg Oral Every 12 hours 03/21/16 0827     03/21/16 0815  azithromycin (ZITHROMAX) 500 mg in dextrose 5 % 250 mL IVPB  Status:  Discontinued     500 mg 250 mL/hr over 60 Minutes Intravenous  Once 03/21/16 0801 03/21/16 0823      Medications   Scheduled Meds: . acetylcysteine  4 mL Nebulization BID  . albuterol  2.5 mg Nebulization Q6H  . ALPRAZolam  0.5 mg Oral QHS  . budesonide (PULMICORT) nebulizer solution  0.25 mg Nebulization BID  . doxycycline  100 mg Oral Q12H  . enoxaparin (LOVENOX)  injection  40 mg Subcutaneous Q24H  . ferrous sulfate  325 mg Oral BID WC  . guaiFENesin  600 mg Oral BID  . magnesium oxide  400 mg Oral QHS  . methylPREDNISolone (SOLU-MEDROL) injection  60 mg Intravenous Q8H  . nicotine  14 mg Transdermal Daily  . pantoprazole  40 mg Oral Daily  . sertraline  100 mg Oral Daily  . tiotropium  18 mcg Inhalation Daily   Continuous Infusions: PRN Meds:.acetaminophen **OR** acetaminophen, docusate sodium, oxyCODONE   Data Review:   Micro Results No results found for this or any previous visit (from the past 240 hour(s)).  Radiology Reports Dg Chest 2 View  Result Date: 03/21/2016 CLINICAL DATA:  Shortness of breath EXAM: CHEST  2 VIEW COMPARISON:  03/01/2016 FINDINGS: Normal heart size and aortic contours. Small hiatal hernia. Chronic hyperinflation. Reticular nodular opacities at the bases that is increased from July 2017 baseline. No focal consolidation. No edema, effusion, or pneumothorax. Remote lower thoracic compression fracture. IMPRESSION: COPD. Bronchitic markings are above prior baseline and there could be acute bronchitis. No focal pneumonia. Electronically Signed   By: Marnee SpringJonathon  Watts M.D.   On: 03/21/2016 07:16   Dg Chest Portable 1 View  Result Date: 03/01/2016 CLINICAL DATA:  Worsening shortness of breath today.  Headache. EXAM: PORTABLE CHEST 1 VIEW COMPARISON:  11/09/2015 and 11/08/2015. FINDINGS: 1215 hours. The heart size and mediastinal contours are stable. Previously noted probable hiatal hernia not well visualized. There is mild aortic atherosclerosis and chronic vascular congestion. There is new left basilar airspace disease with partial obscuration of the left hemidiaphragm peripherally. The right lung appears clear. There is no pneumothorax. The bones appear unremarkable. IMPRESSION: New left lower lobe airspace disease with possible adjacent small pleural effusion. This could reflect atelectasis or pneumonia. Correlate clinically.  Followup PA and lateral chest X-ray is recommended in 3-4  weeks following trial of antibiotic therapy to ensure resolution and exclude underlying malignancy. Electronically Signed   By: Carey Bullocks M.D.   On: 03/01/2016 12:23     CBC  Recent Labs Lab 03/21/16 0626 03/22/16 0322  WBC 5.7 6.6  HGB 9.4* 10.1*  HCT 29.0* 30.9*  PLT 155 175  MCV 85.3 85.6  MCH 27.8 27.9  MCHC 32.6 32.6  RDW 17.5* 17.3*    Chemistries   Recent Labs Lab 03/21/16 0626 03/22/16 0322  NA 141 137  K 4.2 4.5  CL 106 100*  CO2 28 29  GLUCOSE 95 224*  BUN 21* 24*  CREATININE 0.88 0.71  CALCIUM 9.0 9.5   ------------------------------------------------------------------------------------------------------------------ estimated creatinine clearance is 75.4 mL/min (by C-G formula based on SCr of 0.71 mg/dL). ------------------------------------------------------------------------------------------------------------------ No results for input(s): HGBA1C in the last 72 hours. ------------------------------------------------------------------------------------------------------------------ No results for input(s): CHOL, HDL, LDLCALC, TRIG, CHOLHDL, LDLDIRECT in the last 72 hours. ------------------------------------------------------------------------------------------------------------------ No results for input(s): TSH, T4TOTAL, T3FREE, THYROIDAB in the last 72 hours.  Invalid input(s): FREET3 ------------------------------------------------------------------------------------------------------------------  Recent Labs  03/21/16 0626  FERRITIN 43    Coagulation profile No results for input(s): INR, PROTIME in the last 168 hours.  No results for input(s): DDIMER in the last 72 hours.  Cardiac Enzymes  Recent Labs Lab 03/21/16 0626  TROPONINI <0.03   ------------------------------------------------------------------------------------------------------------------ Invalid input(s):  POCBNP    Assessment & Plan  Patient is a 55 year old white female with shortness of breath  1. Acute on chronic COPD exacerbation.  I will continue therapy with nebulizers, IV Solu-Medrol and oral doxycycline I will add mujcinex to her current regimen  2. Acute respiratory failure with hypoxia pulse ox ranging between 88 and 93% on room air. Oxygen supplementation ordered. 3. Anxiety depression on Zoloft 4. Anemia unspecified continue ferrous sulfate. 5. GERD on Protonix 6. Nicotine abuse : Smoking cessation provided recommended she stop smoking 4 minutes spent patient will be continued on a nicotine replacement     Code Status Orders        Start     Ordered   03/21/16 0826  Full code  Continuous     03/21/16 0826    Code Status History    Date Active Date Inactive Code Status Order ID Comments User Context   11/08/2015 11:57 AM 11/10/2015  7:01 PM Full Code [id number]  Eugenie Norrie, NP ED   04/07/2015  9:53 AM 04/09/2015  6:16 PM Full Code [id number]  Shaune Pollack, MD Inpatient    Advance Directive Documentation   Flowsheet Row Most Recent Value  Type of Advance Directive  Living will  Pre-existing out of facility DNR order (yellow form or pink MOST form)  No data  "MOST" Form in Place?  No data           Consults None DVT Prophylaxis  Lovenox  Lab Results  Component Value Date   PLT 175 03/22/2016     Time Spent in minutes    Greater than 50% of time spent in care coordination and counseling patient regarding the condition and plan of care.   Auburn Bilberry M.D on 03/22/2016 at 1:57 PM  Between 7am to 6pm - Pager - [phone]  After 6pm go to [email or web address] - password [email or web address] [facility]  [facility]   Office  [phone]

## 2016-03-22 NOTE — Progress Notes (Signed)
Inpatient Diabetes Program Recommendations  AACE/ADA: New Consensus Statement on Inpatient Glycemic Control (2015)  Target Ranges:  Prepandial:   less than 140 mg/dL      Peak postprandial:   less than 180 mg/dL (1-2 hours)      Critically ill patients:  140 - 180 mg/dL   Lab Results  Component Value Date   GLUCAP 169 (H) 11/08/2015   HGBA1C 6.7 (H) 03/13/2014  Results for Julia [name], [name] (MRN [id number]) as of 03/22/2016 13:52  Ref. Range 03/22/2016 03:22  Glucose Latest Ref Range: 65 - 99 mg/dL 409224 (H)   Inpatient Diabetes Program Recommendations:    Note blood sugars elevated with steroids.  No previous history of diabetes.  Please consider adding Novolog sensitive tid with meals and HS if appropriate (while on steroids).     Thanks, Beryl MeagerJenny Karishma Unrein, RN, BC-ADM Inpatient Diabetes Coordinator Pager [phone] (8a-5p)

## 2016-03-22 NOTE — Progress Notes (Signed)
CH rounding the unit visited Pt. Pt was in good spirit. Pt expressed struggles with cigarette smoking, had stopped drinking, and was determined to stop smoking. Pt has a home church, a supportive family, talked a lot about her faith walk, and expressed desire to identify herself with the right company so as to stop smoking, which she believes might destroy her health. Pt equested prayers for healing and for strength to overcome smoking. CH provided prayers as requested by Pt.     03/22/16 1400  Clinical Encounter Type  Visited With Patient  Visit Type Initial;Spiritual support  Spiritual Encounters  Spiritual Needs Prayer;Other (Comment)

## 2016-03-22 NOTE — Care Management (Signed)
[facility] and spoke with Ardoch Sinkita.  [facility] She has been getting her Advair. Her Spiriva is able to be filled she hasn't been picking it up. [facility] also states they can fill her rescue inhaler, Ventolin, but they do not have a prescription. Patient has the resources set up if she will uitilize them properly.

## 2016-03-23 NOTE — Progress Notes (Signed)
[facility] - [place] at [facility]                                                                                                                                                                                  Patient Demographics   Julia [name], is a 55 y.o. female, DOB - Nov 06, 1960, ZOX:[id number]  Admit date - 03/21/2016   Admitting Physician Alford Highland, MD  Outpatient Primary MD for the patient is [facility]   LOS - 2  Subjective: Patient states that she responded to the Mucomyst therapy. She is coughing a lot more. Shortness of breath is improved.    Review of Systems:   CONSTITUTIONAL: No documented fever. No fatigue, weakness. No weight gain, no weight loss.  EYES: No blurry or double vision.  ENT: No tinnitus. No postnasal drip. No redness of the oropharynx.  RESPIRATORY: Positive cough, positive wheeze, no hemoptysis. Positive dyspnea.  CARDIOVASCULAR: No chest pain. No orthopnea. No palpitations. No syncope.  GASTROINTESTINAL: No nausea, no vomiting or diarrhea. No abdominal pain. No melena or hematochezia.  GENITOURINARY: No dysuria or hematuria.  ENDOCRINE: No polyuria or nocturia. No heat or cold intolerance.  HEMATOLOGY: No anemia. No bruising. No bleeding.  INTEGUMENTARY: No rashes. No lesions.  MUSCULOSKELETAL: No arthritis. No swelling. No gout.  NEUROLOGIC: No numbness, tingling, or ataxia. No seizure-type activity.  PSYCHIATRIC: No anxiety. No insomnia. No ADD.    Vitals:   Vitals:   03/22/16 1514 03/23/16 0405 03/23/16 0809 03/23/16 0827  BP: 125/66 121/67  (!) 142/78  Pulse: 66 (!) 54  (!) 57  Resp: 19 18  18   Temp: 98.3 F (36.8 C) 97.5 F (36.4 C)  97.6 F (36.4 C)  TempSrc: Oral Oral  Oral  SpO2: 97% 96% 100% 95%  Weight:      Height:        Wt Readings from Last 3 Encounters:  03/21/16 162 lb (73.5 kg)  03/01/16 166 lb 12.8 oz (75.7 kg)  11/10/15 152 lb 5.4 oz (69.1 kg)     Intake/Output Summary  (Last 24 hours) at 03/23/16 1459 Last data filed at 03/23/16 1410  Gross per 24 hour  Intake              720 ml  Output                0 ml  Net              720 ml    Physical Exam:   GENERAL: Pleasant-appearing in no apparent distress.  HEAD, EYES, EARS, NOSE AND THROAT: Atraumatic, normocephalic. Extraocular muscles are intact. Pupils equal and reactive to light.  Sclerae anicteric. No conjunctival injection. No oro-pharyngeal erythema.  NECK: Supple. There is no jugular venous distention. No bruits, no lymphadenopathy, no thyromegaly.  HEART: Regular rate and rhythm,. No murmurs, no rubs, no clicks.  LUNGS: Bilateral wheezing throughout both lung. No rales or rhonchi. No wheezes.  ABDOMEN: Soft, flat, nontender, nondistended. Has good bowel sounds. No hepatosplenomegaly appreciated.  EXTREMITIES: No evidence of any cyanosis, clubbing, or peripheral edema.  +2 pedal and radial pulses bilaterally.  NEUROLOGIC: The patient is alert, awake, and oriented x3 with no focal motor or sensory deficits appreciated bilaterally.  SKIN: Moist and warm with no rashes appreciated.  Psych: Not anxious, depressed LN: No inguinal LN enlargement    Antibiotics   Anti-infectives    Start     Dose/Rate Route Frequency Ordered Stop   03/21/16 1000  levofloxacin (LEVAQUIN) tablet 500 mg  Status:  Discontinued     500 mg Oral Daily 03/21/16 0823 03/21/16 0827   03/21/16 1000  doxycycline (VIBRA-TABS) tablet 100 mg     100 mg Oral Every 12 hours 03/21/16 0827     03/21/16 0815  azithromycin (ZITHROMAX) 500 mg in dextrose 5 % 250 mL IVPB  Status:  Discontinued     500 mg 250 mL/hr over 60 Minutes Intravenous  Once 03/21/16 0801 03/21/16 0823      Medications   Scheduled Meds: . acetylcysteine  4 mL Nebulization BID  . ALPRAZolam  0.5 mg Oral QHS  . doxycycline  100 mg Oral Q12H  . enoxaparin (LOVENOX) injection  40 mg Subcutaneous Q24H  . ferrous sulfate  325 mg Oral BID WC  . guaiFENesin  600  mg Oral BID  . magnesium oxide  400 mg Oral QHS  . methylPREDNISolone (SOLU-MEDROL) injection  60 mg Intravenous Daily  . mometasone-formoterol  2 puff Inhalation BID  . nicotine  14 mg Transdermal Daily  . pantoprazole  40 mg Oral Daily  . sertraline  100 mg Oral Daily  . tiotropium  18 mcg Inhalation Daily   Continuous Infusions: PRN Meds:.acetaminophen **OR** acetaminophen, albuterol, docusate sodium, oxyCODONE   Data Review:   Micro Results Recent Results (from the past 240 hour(s))  Culture, expectorated sputum-assessment     Status: None   Collection Time: 03/22/16  6:06 PM  Result Value Ref Range Status   Specimen Description SPUTUM  Final   Special Requests Normal  Final   Sputum evaluation THIS SPECIMEN IS ACCEPTABLE FOR SPUTUM CULTURE  Final   Report Status 03/22/2016 FINAL  Final  Culture, respiratory (NON-Expectorated)     Status: None (Preliminary result)   Collection Time: 03/22/16  6:06 PM  Result Value Ref Range Status   Specimen Description SPUTUM  Final   Special Requests Normal Reflexed from [id number]  Final   Gram Stain   Final    MODERATE WBC PRESENT,BOTH PMN AND MONONUCLEAR MODERATE GRAM POSITIVE COCCI IN PAIRS IN CLUSTERS [facility]formed [facility]    Culture PENDING  Incomplete   Report Status PENDING  Incomplete    Radiology Reports Dg Chest 2 View  Result Date: 03/21/2016 CLINICAL DATA:  Shortness of breath EXAM: CHEST  2 VIEW COMPARISON:  03/01/2016 FINDINGS: Normal heart size and aortic contours. Small hiatal hernia. Chronic hyperinflation. Reticular nodular opacities at the bases that is increased from July 2017 baseline. No focal consolidation. No edema, effusion, or pneumothorax. Remote lower thoracic compression fracture. IMPRESSION: COPD. Bronchitic markings are above prior baseline and there could be acute bronchitis. No focal pneumonia.  Electronically Signed   By: Marnee SpringJonathon  Watts M.D.   On: 03/21/2016 07:16   Dg Chest Portable 1  View  Result Date: 03/01/2016 CLINICAL DATA:  Worsening shortness of breath today.  Headache. EXAM: PORTABLE CHEST 1 VIEW COMPARISON:  11/09/2015 and 11/08/2015. FINDINGS: 1215 hours. The heart size and mediastinal contours are stable. Previously noted probable hiatal hernia not well visualized. There is mild aortic atherosclerosis and chronic vascular congestion. There is new left basilar airspace disease with partial obscuration of the left hemidiaphragm peripherally. The right lung appears clear. There is no pneumothorax. The bones appear unremarkable. IMPRESSION: New left lower lobe airspace disease with possible adjacent small pleural effusion. This could reflect atelectasis or pneumonia. Correlate clinically. Followup PA and lateral chest X-ray is recommended in 3-4 weeks following trial of antibiotic therapy to ensure resolution and exclude underlying malignancy. Electronically Signed   By: Carey BullocksWilliam  Veazey M.D.   On: 03/01/2016 12:23     CBC  Recent Labs Lab 03/21/16 0626 03/22/16 0322  WBC 5.7 6.6  HGB 9.4* 10.1*  HCT 29.0* 30.9*  PLT 155 175  MCV 85.3 85.6  MCH 27.8 27.9  MCHC 32.6 32.6  RDW 17.5* 17.3*    Chemistries   Recent Labs Lab 03/21/16 0626 03/22/16 0322  NA 141 137  K 4.2 4.5  CL 106 100*  CO2 28 29  GLUCOSE 95 224*  BUN 21* 24*  CREATININE 0.88 0.71  CALCIUM 9.0 9.5   ------------------------------------------------------------------------------------------------------------------ estimated creatinine clearance is 75.4 mL/min (by C-G formula based on SCr of 0.71 mg/dL). ------------------------------------------------------------------------------------------------------------------ No results for input(s): HGBA1C in the last 72 hours. ------------------------------------------------------------------------------------------------------------------ No results for input(s): CHOL, HDL, LDLCALC, TRIG, CHOLHDL, LDLDIRECT in the last 72  hours. ------------------------------------------------------------------------------------------------------------------ No results for input(s): TSH, T4TOTAL, T3FREE, THYROIDAB in the last 72 hours.  Invalid input(s): FREET3 ------------------------------------------------------------------------------------------------------------------  Recent Labs  03/21/16 0626  FERRITIN 43    Coagulation profile No results for input(s): INR, PROTIME in the last 168 hours.  No results for input(s): DDIMER in the last 72 hours.  Cardiac Enzymes  Recent Labs Lab 03/21/16 0626  TROPONINI <0.03   ------------------------------------------------------------------------------------------------------------------ Invalid input(s): POCBNP    Assessment & Plan  Patient is a 55 year old white female with shortness of breath  1. Acute on chronic COPD exacerbation.  Continue therapy with nebulizers steroids mucolytic's 2. Acute respiratory failure with hypoxia  Oxygen supplementation wean as tolerated  3. Anxiety depression on Zoloft 4. Anemia unspecified continue ferrous sulfate. 5. GERD on Protonix 6. Nicotine abuse : Smoking cessation provided recommended she stop smoking      Code Status Orders        Start     Ordered   03/21/16 0826  Full code  Continuous     03/21/16 0826    Code Status History    Date Active Date Inactive Code Status Order ID Comments User Context   11/08/2015 11:57 AM 11/10/2015  7:01 PM Full Code [id number]  Eugenie Norrieana G Blakeney, NP ED   04/07/2015  9:53 AM 04/09/2015  6:16 PM Full Code [id number]  Shaune PollackQing Chen, MD Inpatient    Advance Directive Documentation   Flowsheet Row Most Recent Value  Type of Advance Directive  Living will  Pre-existing out of facility DNR order (yellow form or pink MOST form)  No data  "MOST" Form in Place?  No data           Consults None DVT Prophylaxis  Lovenox  Lab Results  Component Value Date  PLT 175 03/22/2016      Time Spent in minutes    Greater than 50% of time spent in care coordination and counseling patient regarding the condition and plan of care.   Auburn Bilberry M.D on 03/23/2016 at 2:59 PM  Between 7am to 6pm - Pager - [phone]  After 6pm go to [email or web address] - password [email or web address] [facility]  [facility]   Office  [phone]

## 2016-03-24 MED ORDER — GUAIFENESIN-CODEINE 100-10 MG/5ML PO SOLN: 10.0000 mL | Freq: Four times a day (QID) | ORAL | 0 refills | Status: DC | PRN

## 2016-03-24 MED ORDER — PREDNISONE 10 MG PO TABS: 5.0000 mg | ORAL_TABLET | Freq: Every day | ORAL | Status: DC

## 2016-03-24 MED ORDER — DOXYCYCLINE HYCLATE 100 MG PO TABS: 100.0000 mg | ORAL_TABLET | Freq: Two times a day (BID) | ORAL | 0 refills | Status: DC

## 2016-03-24 MED ORDER — GUAIFENESIN ER 600 MG PO TB12: 600.0000 mg | ORAL_TABLET | Freq: Two times a day (BID) | ORAL | 0 refills | Status: DC

## 2016-03-24 MED ORDER — ACETYLCYSTEINE 20 % IN SOLN: 4.0000 mL | Freq: Two times a day (BID) | RESPIRATORY_TRACT | 12 refills | Status: DC

## 2016-03-24 NOTE — Discharge Summary (Signed)
[facility] - [place] at [facility]  Julia [name], 55 y.o., DOB 1961/04/11, MRN [id number]. Admission date: 03/21/2016 Discharge Date 03/24/2016 Primary [facility] Admitting Physician Alford Highlandichard Wieting, MD  Admission Diagnosis  COPD exacerbation [facility](HCC) [J44.1]  Discharge Diagnosis   Active Problems:  Acute on chronic COPD exacerbation (HCC)  Acute on chronic respiratory failure  Acute bronchitis Anxiety and depression Anemia unspecified GERD Nicotine abuse       Hospital Course  Julia [name]  is a 55 y.o. female with a known history of COPD presents with shortness of breath. She states this is been going on for a few days. Patient had recently been admitted for the same symptoms. Her chest x-ray was negative she was treated for acute on chronic COPD exasperation patient's breathing improved after getting treated with nebulizers steroids and antibiotics. She WAS wearing oxygen only at nighttime I recommended she wear it at all times at least for the next week or so.            Consults  None  Significant Tests:  See full reports for all details    Dg Chest 2 View  Result Date: 03/21/2016 CLINICAL DATA:  Shortness of breath EXAM: CHEST  2 VIEW COMPARISON:  03/01/2016 FINDINGS: Normal heart size and aortic contours. Small hiatal hernia. Chronic hyperinflation. Reticular nodular opacities at the bases that is increased from July 2017 baseline. No focal consolidation. No edema, effusion, or pneumothorax. Remote lower thoracic compression fracture. IMPRESSION: COPD. Bronchitic markings are above prior baseline and there could be acute bronchitis. No focal pneumonia. Electronically Signed   By: Marnee SpringJonathon  Watts M.D.   On: 03/21/2016 07:16   Dg Chest Portable 1 View  Result Date: 03/01/2016 CLINICAL DATA:  Worsening shortness of breath today.  Headache. EXAM: PORTABLE CHEST 1 VIEW COMPARISON:  11/09/2015 and 11/08/2015. FINDINGS: 1215 hours. The  heart size and mediastinal contours are stable. Previously noted probable hiatal hernia not well visualized. There is mild aortic atherosclerosis and chronic vascular congestion. There is new left basilar airspace disease with partial obscuration of the left hemidiaphragm peripherally. The right lung appears clear. There is no pneumothorax. The bones appear unremarkable. IMPRESSION: New left lower lobe airspace disease with possible adjacent small pleural effusion. This could reflect atelectasis or pneumonia. Correlate clinically. Followup PA and lateral chest X-ray is recommended in 3-4 weeks following trial of antibiotic therapy to ensure resolution and exclude underlying malignancy. Electronically Signed   By: Carey BullocksWilliam  Veazey M.D.   On: 03/01/2016 12:23       Today   Subjective:   Julia [name] feels better sob improved  Objective:   Blood pressure (!) 148/79, pulse 64, temperature 98.1 F (36.7 C), temperature source Oral, resp. rate 14, height 5\' 3"  (1.6 m), weight 162 lb (73.5 kg), SpO2 100 %.  .  Intake/Output Summary (Last 24 hours) at 03/24/16 1545 Last data filed at 03/23/16 2300  Gross per 24 hour  Intake              141 ml  Output                0 ml  Net              141 ml    Exam VITAL SIGNS: Blood pressure (!) 148/79, pulse 64, temperature 98.1 F (36.7 C), temperature source Oral, resp. rate 14, height 5\' 3"  (1.6 m), weight 162 lb (73.5 kg), SpO2 100 %.  GENERAL:  55  y.o.-year-old patient lying in the bed with no acute distress.  EYES: Pupils equal, round, reactive to light and accommodation. No scleral icterus. Extraocular muscles intact.  HEENT: Head atraumatic, normocephalic. Oropharynx and nasopharynx clear.  NECK:  Supple, no jugular venous distention. No thyroid enlargement, no tenderness.  LUNGS: Normal breath sounds bilaterally, no wheezing, rales,rhonchi or crepitation. No use of accessory muscles of respiration.  CARDIOVASCULAR: S1, S2 normal. No murmurs,  rubs, or gallops.  ABDOMEN: Soft, nontender, nondistended. Bowel sounds present. No organomegaly or mass.  EXTREMITIES: No pedal edema, cyanosis, or clubbing.  NEUROLOGIC: Cranial nerves II through XII are intact. Muscle strength 5/5 in all extremities. Sensation intact. Gait not checked.  PSYCHIATRIC: The patient is alert and oriented x 3.  SKIN: No obvious rash, lesion, or ulcer.   Data Review     CBC w Diff: Lab Results  Component Value Date   WBC 6.6 03/22/2016   HGB 10.1 (L) 03/22/2016   HGB 8.3 (L) 04/02/2014   HCT 30.9 (L) 03/22/2016   HCT 26.7 (L) 04/02/2014   PLT 175 03/22/2016   PLT 243 04/02/2014   LYMPHOPCT 17 11/08/2015   LYMPHOPCT 7.6 04/02/2014   MONOPCT 10 11/08/2015   MONOPCT 3.1 04/02/2014   EOSPCT 5 11/08/2015   EOSPCT 0.0 04/02/2014   BASOPCT 0 11/08/2015   BASOPCT 0.1 04/02/2014   CMP: Lab Results  Component Value Date   NA 137 03/22/2016   NA 139 03/31/2014   K 4.5 03/22/2016   K 3.3 (L) 03/31/2014   CL 100 (L) 03/22/2016   CL 104 03/31/2014   CO2 29 03/22/2016   CO2 28 03/31/2014   BUN 24 (H) 03/22/2016   BUN 17 03/31/2014   CREATININE 0.71 03/22/2016   CREATININE 0.97 03/31/2014   PROT 7.0 03/01/2016   PROT 8.0 03/12/2014   ALBUMIN 3.9 03/01/2016   ALBUMIN 3.4 03/12/2014   BILITOT <0.1 (L) 03/01/2016   BILITOT 0.2 03/12/2014   ALKPHOS 58 03/01/2016   ALKPHOS 92 03/12/2014   AST 19 03/01/2016   AST 28 03/12/2014   ALT 17 03/01/2016   ALT 31 03/12/2014  .  Micro Results Recent Results (from the past 240 hour(s))  Culture, expectorated sputum-assessment     Status: None   Collection Time: 03/22/16  6:06 PM  Result Value Ref Range Status   Specimen Description SPUTUM  Final   Special Requests Normal  Final   Sputum evaluation THIS SPECIMEN IS ACCEPTABLE FOR SPUTUM CULTURE  Final   Report Status 03/22/2016 FINAL  Final  Culture, respiratory (NON-Expectorated)     Status: None (Preliminary result)   Collection Time: 03/22/16  6:06  PM  Result Value Ref Range Status   Specimen Description SPUTUM  Final   Special Requests Normal Reflexed from [id number]  Final   Gram Stain   Final    MODERATE WBC PRESENT,BOTH PMN AND MONONUCLEAR MODERATE GRAM POSITIVE COCCI IN PAIRS IN CLUSTERS    Culture   Final    CULTURE REINCUBATED FOR BETTER GROWTH Performed at [facility]    Report Status PENDING  Incomplete        Code Status Orders        Start     Ordered   03/21/16 0826  Full code  Continuous     03/21/16 0826    Code Status History    Date Active Date Inactive Code Status Order ID Comments User Context   11/08/2015 11:57 AM 11/10/2015  7:01 PM Full Code  [id number]  Eugenie Norrie, NP ED   04/07/2015  9:53 AM 04/09/2015  6:16 PM Full Code [id number]  Shaune Pollack, MD Inpatient    Advance Directive Documentation   Flowsheet Row Most Recent Value  Type of Advance Directive  Living will  Pre-existing out of facility DNR order (yellow form or pink MOST form)  No data  "MOST" Form in Place?  No data          Follow-up Information    Sharilyn Sites, MD On 04/01/2016.   Specialty:  Family Medicine Why:  Appointment is at 8:00 Contact information: [address] [place] [address] [phone]           Discharge Medications     Medication List    TAKE these medications   acetylcysteine 20 % nebulizer solution Commonly known as:  MUCOMYST Take 4 mLs by nebulization 2 (two) times daily.   albuterol (2.5 MG/3ML) 0.083% nebulizer solution Commonly known as:  PROVENTIL Take 2.5 mg by nebulization every 6 (six) hours as needed for wheezing or shortness of breath.   albuterol 108 (90 Base) MCG/ACT inhaler Commonly known as:  PROVENTIL HFA;VENTOLIN HFA Inhale 2 puffs into the lungs every 6 (six) hours as needed for wheezing or shortness of breath.   Aloe Vera 470 MG Caps Take 470 mg by mouth at bedtime.   ALPRAZolam 0.5 MG tablet Commonly known as:  XANAX Take 0.5 mg by mouth at bedtime.    CENTRUM SILVER PO Take 1 tablet by mouth daily.   docusate sodium 100 MG capsule Commonly known as:  COLACE Take 100 mg by mouth daily as needed for mild constipation.   doxycycline 100 MG tablet Commonly known as:  VIBRA-TABS Take 1 tablet (100 mg total) by mouth every 12 (twelve) hours.   ferrous sulfate 325 (65 FE) MG tablet Take 1 tablet (325 mg total) by mouth 2 (two) times daily with a meal.   Fluticasone-Salmeterol 500-50 MCG/DOSE Aepb Commonly known as:  ADVAIR Inhale 1 puff into the lungs 2 (two) times daily.   guaiFENesin 600 MG 12 hr tablet Commonly known as:  MUCINEX Take 1 tablet (600 mg total) by mouth 2 (two) times daily.   guaiFENesin-codeine 100-10 MG/5ML syrup Take 10 mLs by mouth every 6 (six) hours as needed for cough.   magnesium oxide 400 MG tablet Commonly known as:  MAG-OX Take 400 mg by mouth at bedtime.   pantoprazole 40 MG tablet Commonly known as:  PROTONIX Take 40 mg by mouth daily.   Potassium 99 MG Tabs Take 1 tablet by mouth daily.   predniSONE 10 MG tablet Commonly known as:  DELTASONE Take 0.5 tablets (5 mg total) by mouth daily with breakfast. Start at 60mg  taper by 10 mg every  3days until finish   sertraline 100 MG tablet Commonly known as:  ZOLOFT Take 100 mg by mouth daily.   tiotropium 18 MCG inhalation capsule Commonly known as:  SPIRIVA Place 1 capsule (18 mcg total) into inhaler and inhale daily.          Total Time in preparing paper work, data evaluation and todays exam - 35 minutes  Auburn Bilberry M.D on 03/24/2016 at 3:45 PM  [facility]   Office  [phone]

## 2016-03-24 NOTE — Progress Notes (Signed)
Pt being discharged home today. PIV removed. Discharge instructions reviewed with pt, all questions answered. Prescriptions given to pt to have filled at pharmacy. She will follow uo with PCP. She is leaving with all her belongings. Will be transported home via family member.

## 2016-03-24 NOTE — Discharge Instructions (Signed)
[facility] - [place] at [facility] ° °DIET:  °Regular diet ° °DISCHARGE CONDITION:  °Stable ° °ACTIVITY:  °Activity as tolerated ° °OXYGEN:  °Home Oxygen: Yes.   °  °Oxygen Delivery: 2 liters/min via Patient connected to nasal cannula oxygen ° °DISCHARGE LOCATION:  °home  ° ° °ADDITIONAL DISCHARGE INSTRUCTION: ° ° °If you experience worsening of your admission symptoms, develop shortness of breath, life threatening emergency, suicidal or homicidal thoughts you must seek medical attention immediately by calling 911 or calling your MD immediately  if symptoms less severe. ° °You Must read complete instructions/literature along with all the possible adverse reactions/side effects for all the Medicines you take and that have been prescribed to you. Take any new Medicines after you have completely understood and accpet all the possible adverse reactions/side effects.  ° °Please note ° °You were cared for by a hospitalist during your hospital stay. If you have any questions about your discharge medications or the care you received while you were in the hospital after you are discharged, you can call the unit and asked to speak with the hospitalist on call if the hospitalist that took care of you is not available. Once you are discharged, your primary care physician will handle any further medical issues. Please note that NO REFILLS for any discharge medications will be authorized once you are discharged, as it is imperative that you return to your primary care physician (or establish a relationship with a primary care physician if you do not have one) for your aftercare needs so that they can reassess your need for medications and monitor your lab values. ° ° °

## 2016-03-26 LAB — CULTURE, RESPIRATORY W GRAM STAIN
Culture: NORMAL
Special Requests: NORMAL

## 2016-05-05 ENCOUNTER — Telehealth: Payer: Self-pay | Admitting: Pharmacist

## 2016-05-05 NOTE — Telephone Encounter (Signed)
Advair 500/50 refill PAP submitted to manufacturer today.

## 2016-07-15 DIAGNOSIS — G8929 Other chronic pain: Secondary | ICD-10-CM | POA: Insufficient documentation

## 2016-09-02 ENCOUNTER — Telehealth: Payer: Self-pay | Admitting: Pharmacist

## 2016-09-02 NOTE — Telephone Encounter (Signed)
09/02/16 Placed refill online with [employer] for Ventolin HFA, to ship 09/15/16. ° °

## 2016-10-25 ENCOUNTER — Telehealth: Payer: Self-pay | Admitting: Pharmacist

## 2016-10-25 NOTE — Telephone Encounter (Signed)
10/25/16 Placed refill online with [employer], for Ventolin, to release 7/30/1/8, Order# [id number].Forde RadonAJ

## 2016-11-26 ENCOUNTER — Telehealth: Payer: Self-pay | Admitting: Pharmacy Technician

## 2016-11-26 NOTE — Telephone Encounter (Signed)
Patient eligible to receive medication assistance at [facility] through 2018, as long as eligibility requirements continue to be met.  Julia [name] Care Manager [facility] 

## 2016-12-31 ENCOUNTER — Telehealth: Payer: Self-pay | Admitting: Pharmacist

## 2016-12-31 NOTE — Telephone Encounter (Signed)
12/31/16 Faxed [employer] application for enrollment for [employer] [place] [employer]ix Starter Pack Take (1) 0.5mg  daily x 3 days, then increase to (1) 0.5mg  twice daily x 4 days, then (1) 1 mg twice daily for 11 weeks #53 and also for Chantix 1mg  Take one tablet by mouth two times a day #180.Forde RadonAJ

## 2017-02-28 ENCOUNTER — Telehealth: Payer: Self-pay | Admitting: Pharmacist

## 2017-02-28 NOTE — Telephone Encounter (Signed)
02/28/17 Faxed [employer] re enrollment application for Ventolin HFA 90mcg Inhale 2 puffs every four hours as needed for wheezing, also Advair 500/50 mcg Inhale 1 puff two times a day.Forde RadonAJ

## 2017-03-14 ENCOUNTER — Telehealth: Payer: Self-pay | Admitting: Pharmacist

## 2017-03-14 NOTE — Telephone Encounter (Signed)
03/14/17 Called [employer] for refill on Chantix 1mg , allow 7-10 business days to receive.Forde RadonAJ

## 2017-05-04 ENCOUNTER — Telehealth: Payer: Self-pay | Admitting: Pharmacist

## 2017-05-04 NOTE — Telephone Encounter (Signed)
05/04/17 Placed refill online for Ventolin HFA & Advair 250/50 to release 06/03/17, order# [id number].Forde RadonAJ

## 2017-07-01 ENCOUNTER — Telehealth: Payer: Self-pay | Admitting: Pharmacy Technician

## 2017-07-01 NOTE — Telephone Encounter (Signed)
Patient eligible to receive medication assistance at [facility] through 2019, as long as eligibility requirements continue to be met.  [facility]

## 2017-07-07 ENCOUNTER — Telehealth: Payer: Self-pay | Admitting: Pharmacist

## 2017-07-07 NOTE — Telephone Encounter (Signed)
07/07/2017 12:01:54 PM - Advair 500/50 & Ventolin HFA 90 refill  07/07/17 Placed refills online with [employer] for Advair 500/50 & Ventolin HFA 90mcg, will release 08/04/17, order# [id number].Forde RadonAJ

## 2017-11-08 IMAGING — DX DG CHEST 1V PORT
1 series · 1 of 1 positions shown · non-contrast
Comparison: 11/09/2015 and 11/08/2015.

CLINICAL DATA: Worsening shortness of breath today.  Headache.

EXAM:
PORTABLE CHEST 1 VIEW

[chest ap]
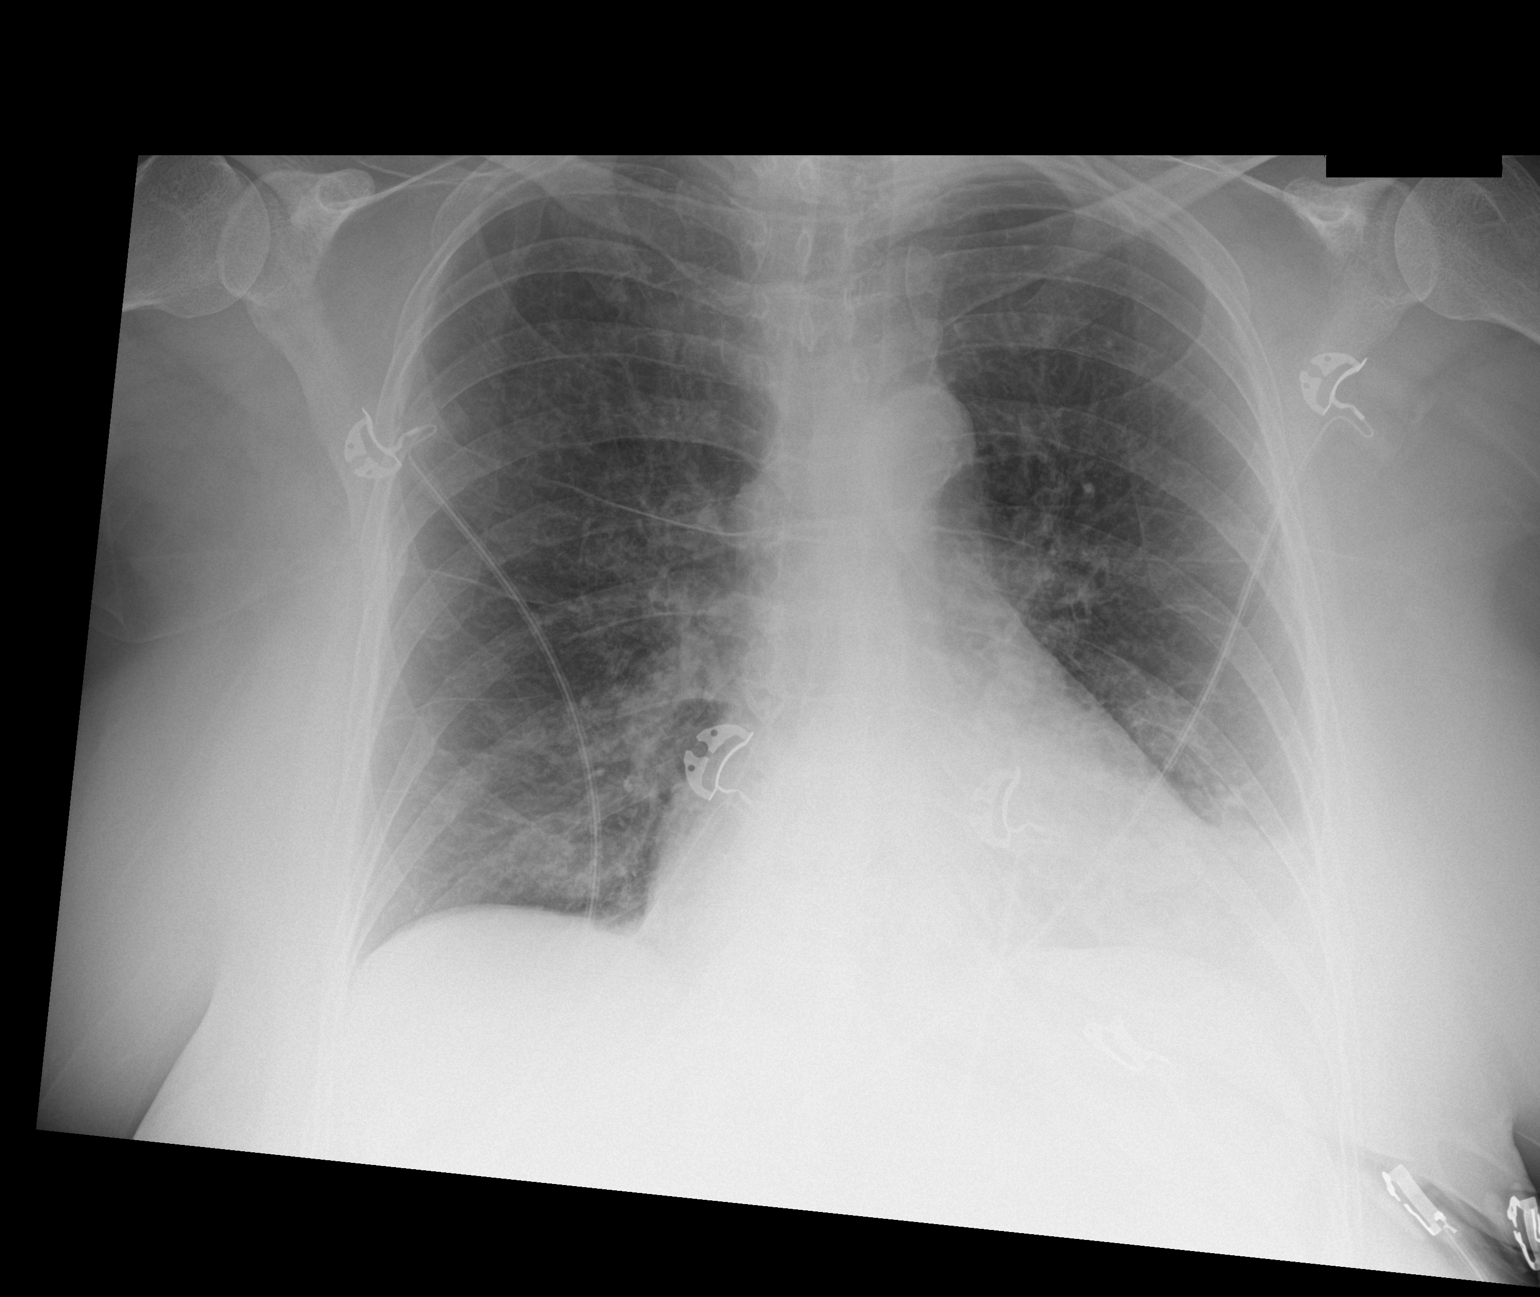

[1 of 1 positions shown; findings below may reference images not displayed]

FINDINGS: 6468 hours. The heart size and mediastinal contours are stable.
Previously noted probable hiatal hernia not well visualized. There
is mild aortic atherosclerosis and chronic vascular congestion.
There is new left basilar airspace disease with partial obscuration
of the left hemidiaphragm peripherally. The right lung appears
clear. There is no pneumothorax. The bones appear unremarkable.
IMPRESSION: New left lower lobe airspace disease with possible adjacent small
pleural effusion. This could reflect atelectasis or pneumonia.
Correlate clinically. Followup PA and lateral chest X-ray is
recommended in 3-4 weeks following trial of antibiotic therapy to
ensure resolution and exclude underlying malignancy.

## 2018-08-23 ENCOUNTER — Telehealth: Payer: Self-pay | Admitting: Pharmacy Technician

## 2018-08-23 NOTE — Telephone Encounter (Signed)
Notified patient regarding additional financial information needed.  [facility] still needs 2020 Social Security Award Letter from [employer] and current checking account statement.  Provided patient with [facility] e-mail address in the event she would like to e-mail instead of coming into our clinic.  Patient stated that she would provide this information by 09/01/18.  Sherilyn Dacosta Care Manager [facility]

## 2018-11-24 ENCOUNTER — Telehealth: Payer: Self-pay | Admitting: Pharmacy Technician

## 2018-11-24 NOTE — Telephone Encounter (Signed)
Failed to provide 2020 poi.  No medication assistance will be provided until [facility] receives up-to-date financial information.  [place] [facility]

## 2019-01-12 ENCOUNTER — Telehealth: Payer: Self-pay | Admitting: Pharmacy Technician

## 2019-01-12 NOTE — Telephone Encounter (Signed)
Received 2020 proof of income.  Patient eligible to receive medication assistance at [facility] as long as eligibility requirements continue to be met.  [place] [facility]

## 2019-03-19 ENCOUNTER — Other Ambulatory Visit: Payer: Self-pay

## 2019-03-19 DIAGNOSIS — Z20822 Contact with and (suspected) exposure to covid-19: Secondary | ICD-10-CM

## 2019-03-21 LAB — NOVEL CORONAVIRUS, NAA: SARS-CoV-2, NAA: NOT DETECTED

## 2019-03-22 ENCOUNTER — Telehealth: Payer: Self-pay

## 2019-03-22 NOTE — Telephone Encounter (Signed)
Negative COVID results given. Patient results "NOT Detected." Caller expressed understanding. ° °

## 2019-06-14 ENCOUNTER — Ambulatory Visit: Payer: Self-pay | Attending: Internal Medicine

## 2019-06-14 DIAGNOSIS — Z20822 Contact with and (suspected) exposure to covid-19: Secondary | ICD-10-CM | POA: Insufficient documentation

## 2019-06-15 LAB — NOVEL CORONAVIRUS, NAA: SARS-CoV-2, NAA: NOT DETECTED

## 2019-07-28 ENCOUNTER — Ambulatory Visit: Payer: Self-pay | Attending: Internal Medicine

## 2019-07-28 DIAGNOSIS — Z23 Encounter for immunization: Secondary | ICD-10-CM

## 2019-07-28 NOTE — Progress Notes (Signed)
   Covid-19 Vaccination Clinic  Name:  KADEISHA BETSCH    MRN: [id number] DOB: 04/04/1961  07/28/2019  Ms. Tibbetts was observed post Covid-19 immunization for 15 minutes without incident. She was provided with Vaccine Information Sheet and instruction to access the V-Safe system.   Ms. Sohm was instructed to call 911 with any severe reactions post vaccine: Marland Kitchen Difficulty breathing  . Swelling of face and throat  . A fast heartbeat  . A bad rash all over body  . Dizziness and weakness   Immunizations Administered    Name Date Dose VIS Date Route   Pfizer COVID-19 Vaccine 07/28/2019 12:05 PM 0.3 mL 04/13/2019 Intramuscular   Manufacturer: [employer], [employer]   Lot: [id number]   NDC: [address]-7

## 2019-08-21 ENCOUNTER — Ambulatory Visit: Payer: Self-pay | Attending: Internal Medicine

## 2019-08-21 DIAGNOSIS — Z23 Encounter for immunization: Secondary | ICD-10-CM

## 2019-08-21 NOTE — Progress Notes (Signed)
   Covid-19 Vaccination Clinic  Name:  Julia [name]    MRN: [id number] DOB: [birth year]  08/21/2019  Julia [name] was observed post Covid-19 immunization for 15 minutes without incident. She was provided with Vaccine Information Sheet and instruction to access the V-Safe system.   Julia [name] was instructed to call 911 with any severe reactions post vaccine: Marland Kitchen Difficulty breathing  . Swelling of face and throat  . A fast heartbeat  . A bad rash all over body  . Dizziness and weakness   Immunizations Administered    Name Date Dose VIS Date Route   Pfizer COVID-19 Vaccine 08/21/2019 11:06 AM 0.3 mL 06/27/2018 Intramuscular   Manufacturer: [employer], [employer]   Lot: [id number]   NDC: [address]-8

## 2019-11-07 ENCOUNTER — Other Ambulatory Visit: Payer: Self-pay | Admitting: Pulmonary Disease

## 2019-12-11 ENCOUNTER — Telehealth: Payer: Self-pay | Admitting: Pharmacy Technician

## 2019-12-11 NOTE — Telephone Encounter (Signed)
Received updated proof of income.  Patient eligible to receive medication assistance at [facility] until time for re-certification in 2022, and as long as eligibility requirements continue to be met. ° °Julia [name] J. Audia Amick °Care Manager °[facility] °

## 2020-04-22 ENCOUNTER — Encounter: Payer: Self-pay | Admitting: Surgery

## 2020-04-22 ENCOUNTER — Ambulatory Visit (INDEPENDENT_AMBULATORY_CARE_PROVIDER_SITE_OTHER): Payer: 59 | Admitting: Surgery

## 2020-04-22 ENCOUNTER — Other Ambulatory Visit: Payer: Self-pay

## 2020-04-22 VITALS — BP 128/72 | HR 72 | Temp 97.7°F | Ht 65.0 in | Wt 135.2 lb

## 2020-04-22 DIAGNOSIS — K432 Incisional hernia without obstruction or gangrene: Secondary | ICD-10-CM

## 2020-04-22 HISTORY — DX: Incisional hernia without obstruction or gangrene: K43.2

## 2020-04-22 NOTE — Patient Instructions (Signed)
Please stop smoking so you can have surgery for abdominal hernia repair.  We will see what other options are available to assist with your hernia.    Steps to Quit Smoking Smoking tobacco is the leading cause of preventable death. It can affect almost every organ in the body. Smoking puts you and people around you at risk for many serious, long-lasting (chronic) diseases. Quitting smoking can be hard, but it is one of the best things that you can do for your health. It is never too late to quit. How do I get ready to quit? When you decide to quit smoking, make a plan to help you succeed. Before you quit:  Pick a date to quit. Set a date within the next 2 weeks to give you time to prepare.  Write down the reasons why you are quitting. Keep this list in places where you will see it often.  Tell your family, friends, and co-workers that you are quitting. Their support is important.  Talk with your doctor about the choices that may help you quit.  Find out if your health insurance will pay for these treatments.  Know the people, places, things, and activities that make you want to smoke (triggers). Avoid them. What first steps can I take to quit smoking?  Throw away all cigarettes at home, at work, and in your car.  Throw away the things that you use when you smoke, such as ashtrays and lighters.  Clean your car. Make sure to empty the ashtray.  Clean your home, including curtains and carpets. What can I do to help me quit smoking? Talk with your doctor about taking medicines and seeing a counselor at the same time. You are more likely to succeed when you do both.  If you are pregnant or breastfeeding, talk with your doctor about counseling or other ways to quit smoking. Do not take medicine to help you quit smoking unless your doctor tells you to do so. To quit smoking: Quit right away  Quit smoking totally, instead of slowly cutting back on how much you smoke over a period of  time.  Go to counseling. You are more likely to quit if you go to counseling sessions regularly. Take medicine You may take medicines to help you quit. Some medicines need a prescription, and some you can buy over-the-counter. Some medicines may contain a drug called nicotine to replace the nicotine in cigarettes. Medicines may:  Help you to stop having the desire to smoke (cravings).  Help to stop the problems that come when you stop smoking (withdrawal symptoms). Your doctor may ask you to use:  Nicotine patches, gum, or lozenges.  Nicotine inhalers or sprays.  Non-nicotine medicine that is taken by mouth. Find resources Find resources and other ways to help you quit smoking and remain smoke-free after you quit. These resources are most helpful when you use them often. They include:  Online chats with a Veterinary surgeon.  Phone quitlines.  Printed Materials engineer.  Support groups or group counseling.  Text messaging programs.  Mobile phone apps. Use apps on your mobile phone or tablet that can help you stick to your quit plan. There are many free apps for mobile phones and tablets as well as websites. Examples include Quit Guide from the [employer] and smokefree.gov  What things can I do to make it easier to quit?   Talk to your family and friends. Ask them to support and encourage you.  Call a phone quitline (1-800-QUIT-NOW),  reach out to support groups, or work with a Veterinary surgeon.  Ask people who smoke to not smoke around you.  Avoid places that make you want to smoke, such as: ? Bars. ? Parties. ? Smoke-break areas at work.  Spend time with people who do not smoke.  Lower the stress in your life. Stress can make you want to smoke. Try these things to help your stress: ? Getting regular exercise. ? Doing deep-breathing exercises. ? Doing yoga. ? Meditating. ? Doing a body scan. To do this, close your eyes, focus on one area of your body at a time from head to toe. Notice  which parts of your body are tense. Try to relax the muscles in those areas. How will I feel when I quit smoking? Day 1 to 3 weeks Within the first 24 hours, you may start to have some problems that come from quitting tobacco. These problems are very bad 2-3 days after you quit, but they do not often last for more than 2-3 weeks. You may get these symptoms:  Mood swings.  Feeling restless, nervous, angry, or annoyed.  Trouble concentrating.  Dizziness.  Strong desire for high-sugar foods and nicotine.  Weight gain.  Trouble pooping (constipation).  Feeling like you may vomit (nausea).  Coughing or a sore throat.  Changes in how the medicines that you take for other issues work in your body.  Depression.  Trouble sleeping (insomnia). Week 3 and afterward After the first 2-3 weeks of quitting, you may start to notice more positive results, such as:  Better sense of smell and taste.  Less coughing and sore throat.  Slower heart rate.  Lower blood pressure.  Clearer skin.  Better breathing.  Fewer sick days. Quitting smoking can be hard. Do not give up if you fail the first time. Some people need to try a few times before they succeed. Do your best to stick to your quit plan, and talk with your doctor if you have any questions or concerns. Summary  Smoking tobacco is the leading cause of preventable death. Quitting smoking can be hard, but it is one of the best things that you can do for your health.  When you decide to quit smoking, make a plan to help you succeed.  Quit smoking right away, not slowly over a period of time.  When you start quitting, seek help from your doctor, family, or friends. This information is not intended to replace advice given to you by your health care provider. Make sure you discuss any questions you have with your health care provider. Document Revised: 01/12/2019 Document Reviewed: 07/08/2018 Elsevier Patient Education  2020 Elsevier  Inc.    Hernia, Adult     A hernia happens when tissue inside your body pushes out through a weak spot in your belly muscles (abdominal wall). This makes a round lump (bulge). The lump may be:  In a scar from surgery that was done in your belly (incisional hernia).  Near your belly button (umbilical hernia).  In your groin (inguinal hernia). Your groin is the area where your leg meets your lower belly (abdomen). This kind of hernia could also be: ? In your scrotum, if you are female. ? In folds of skin around your vagina, if you are female.  In your upper thigh (femoral hernia).  Inside your belly (hiatal hernia). This happens when your stomach slides above the muscle between your belly and your chest (diaphragm). If your hernia is small and it  does not cause pain, you may not need treatment. If your hernia is large or it causes pain, you may need surgery. Follow these instructions at home: Activity  Avoid stretching or overusing (straining) the muscles near your hernia. Straining can happen when you: ? Lift something heavy. ? Poop (have a bowel movement).  Do not lift anything that is heavier than 10 lb (4.5 kg), or the limit that you are told, until your doctor says that it is safe.  Use the strength of your legs when you lift something heavy. Do not use only your back muscles to lift. General instructions  Do these things if told by your doctor so you do not have trouble pooping (constipation): ? Drink enough fluid to keep your pee (urine) pale yellow. ? Eat foods that are high in fiber. These include fresh fruits and vegetables, whole grains, and beans. ? Limit foods that are high in fat and processed sugars. These include foods that are fried or sweet. ? Take medicine for trouble pooping.  When you cough, try to cough gently.  You may try to push your hernia in by very gently pressing on it when you are lying down. Do not try to force the bulge back in if it will not push  in easily.  If you are overweight, work with your doctor to lose weight safely.  Do not use any products that have nicotine or tobacco in them. These include cigarettes and e-cigarettes. If you need help quitting, ask your doctor.  If you will be having surgery (hernia repair), watch your hernia for changes in shape, size, or color. Tell your doctor if you see any changes.  Take over-the-counter and prescription medicines only as told by your doctor.  Keep all follow-up visits as told by your doctor. Contact a doctor if:  You get new pain, swelling, or redness near your hernia.  You poop fewer times in a week than normal.  You have trouble pooping.  You have poop (stool) that is more dry than normal.  You have poop that is harder or larger than normal. Get help right away if:  You have a fever.  You have belly pain that gets worse.  You feel sick to your stomach (nauseous).  You throw up (vomit).  Your hernia cannot be pushed in by very gently pressing on it when you are lying down. Do not try to force the bulge back in if it will not push in easily.  Your hernia: ? Changes in shape or size. ? Changes color. ? Feels hard or it hurts when you touch it. These symptoms may represent a serious problem that is an emergency. Do not wait to see if the symptoms will go away. Get medical help right away. Call your local emergency services (911 in the [place] Summary  A hernia happens when tissue inside your body pushes out through a weak spot in the belly muscles. This creates a bulge.  If your hernia is small and it does not hurt, you may not need treatment. If your hernia is large or it hurts, you may need surgery.  If you will be having surgery, watch your hernia for changes in shape, size, or color. Tell your doctor about any changes. This information is not intended to replace advice given to you by your health care provider. Make sure you discuss any questions you have with  your health care provider. Document Revised: 08/10/2018 Document Reviewed: 01/19/2017 Elsevier Patient Education  [phone]  [employer].

## 2020-04-22 NOTE — Progress Notes (Signed)
Patient ID: Julia [name], female   DOB: 03-Jul-1960, 59 y.o.   MRN: [id number]  Chief Complaint: Abdominal wall hernia  History of Present Illness Julia [name] is a 59 y.o. 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  She denies any history of hernia repair surgery.  She reports having her current abdominal wall hernia for 10 years.  Its become more painful and bigger over time.  She is concerned that she may be developing more/another.  She denies fevers or chills.  She denies vomiting.  She reports occasional nausea, and regular bowel activity.  She has had no prior work-up to anticipate repair of her hernia.  She has been whittling down her smoking to about 2 to 3 cigarettes daily, and now appears to be much more motivated to completely be rid of smoking and nicotine consumption.  Past Medical History Past Medical History:  Diagnosis Date  . Anemia   . Anxiety   . Asthma   . COPD (chronic obstructive pulmonary disease) (HCC)   . Depression   . Ventral hernia       Past Surgical History:  Procedure Laterality Date  . COLON SURGERY    . EAR TUBE REMOVAL    . FRACTURE SURGERY    . HERNIA REPAIR    . TUBAL LIGATION      Allergies  Allergen Reactions  . Atrovent [Ipratropium] Shortness Of Breath    Current Outpatient Medications  Medication Sig Dispense Refill  . acetylcysteine (MUCOMYST) 20 % nebulizer solution Take 4 mLs by nebulization 2 (two) times daily. 30 mL 12  . albuterol (PROVENTIL HFA;VENTOLIN HFA) 108 (90 BASE) MCG/ACT inhaler Inhale 2 puffs into the lungs every 6 (six) hours as needed for wheezing or shortness of breath. 1 Inhaler 2  . albuterol (PROVENTIL) (2.5 MG/3ML) 0.083% nebulizer solution Take 2.5 mg by nebulization every 6 (six) hours as needed for wheezing or shortness of breath.    . ALPRAZolam (XANAX) 0.5 MG tablet Take 0.5 mg by mouth at bedtime.    . docusate sodium  (COLACE) 100 MG capsule Take 100 mg by mouth daily as needed for mild constipation.    . ferrous sulfate 325 (65 FE) MG tablet Take 1 tablet (325 mg total) by mouth 2 (two) times daily with a meal. 60 tablet 1  . Fluticasone-Umeclidin-Vilant (TRELEGY ELLIPTA IN) Inhale into the lungs.    Marland Kitchen guaiFENesin (MUCINEX) 600 MG 12 hr tablet Take 1 tablet (600 mg total) by mouth 2 (two) times daily. 14 tablet 0  . magnesium oxide (MAG-OX) 400 MG tablet Take 400 mg by mouth at bedtime.     . pantoprazole (PROTONIX) 40 MG tablet Take 40 mg by mouth daily.    . Potassium 99 MG TABS Take 1 tablet by mouth daily.    . sertraline (ZOLOFT) 100 MG tablet Take 100 mg by mouth daily.     No current facility-administered medications for this visit.    Family History Family History  Problem Relation Age of Onset  . Sarcoidosis Mother   . Alcohol abuse Father       Social History Social History   Tobacco Use  . Smoking status: Current Every Day Smoker    Packs/day: 0.50    Types: Cigarettes  . Smokeless tobacco: Never Used  Substance Use Topics  . Alcohol use: No  . Drug use: No        Review of  Systems  Constitutional: Negative for chills, diaphoresis, fever, malaise/fatigue and weight loss.  HENT: Positive for tinnitus.   Eyes: Negative.   Respiratory: Positive for shortness of breath and wheezing.   Cardiovascular: Negative.   Gastrointestinal: Positive for abdominal pain, heartburn, melena and nausea. Negative for constipation, diarrhea and vomiting.  Genitourinary: Negative.   Skin: Negative.   Neurological: Negative.   Psychiatric/Behavioral: Positive for depression.      Physical Exam Blood pressure 128/72, pulse 72, temperature 97.7 F (36.5 C), temperature source Oral, height 5\' 5"  (1.651 m), weight 135 lb 3.2 oz (61.3 kg), SpO2 92 %. Last Weight  Most recent update: 04/22/2020  2:35 PM   Weight  61.3 kg (135 lb 3.2 oz)            CONSTITUTIONAL: Well developed, and  nourished, appropriately responsive and aware without distress.   EYES: Sclera non-icteric.   EARS, NOSE, MOUTH AND THROAT: Mask worn.    Hearing is intact to voice.  NECK: Trachea is midline, and there is no jugular venous distension.  LYMPH NODES:  Lymph nodes in the neck are not enlarged. RESPIRATORY:  Lungs are clear, and breath sounds are equal bilaterally. Normal respiratory effort without pathologic use of accessory muscles. CARDIOVASCULAR: Heart is regular in rate and rhythm. GI: The abdomen is soft, nontender, and nondistended.  There is a rather prominent bulge in the upper midline, soft with intra-abdominal contents present.  Due to the amount of extracorporal viscera, it is difficult to fully appreciate the fascial perimeter, I suspect from the left costal margin to near the umbilicus and estimated as wide as 10 cm.  There were no other palpable masses. MUSCULOSKELETAL:  Symmetrical muscle tone appreciated in all four extremities.    SKIN: Skin turgor is normal. No pathologic skin lesions appreciated.  NEUROLOGIC:  Motor and sensation appear grossly normal.  Cranial nerves are grossly without defect. PSYCH:  Alert and oriented to person, place and time. Affect is appropriate for situation.  Data Reviewed I have personally reviewed what is currently available of the patient's imaging, recent labs and medical records.   Labs: None current    Imaging: Nothing recent. Within last 24 hrs: No results found.  Assessment    Large incisional hernia, significant separation of midline rectus sheath. Patient Active Problem List   Diagnosis Date Noted  . Acute respiratory failure (HCC) 11/08/2015  . COPD exacerbation (HCC) 04/07/2015    Plan    Complete smoking cessation, and no further utilization of nicotine. Revisit in 1 to 2 months.  We discussed the possibility of referral to hernia specialist as this will likely require component separation, and may eventually necessitate use  of bridging mesh. We discussed referral to [facility] for the hernia center there, possibility of returning to [facility] though she has not had the best of experiences there. Face-to-face time spent with the patient and accompanying care providers(if present) was 45 minutes, with more than 50% of the time spent counseling, educating, and coordinating care of the patient.      [facility] M.D., FACS 04/22/2020, 3:03 PM

## 2020-06-24 ENCOUNTER — Ambulatory Visit: Payer: Self-pay | Admitting: Surgery

## 2020-07-01 ENCOUNTER — Encounter: Payer: Self-pay | Admitting: Surgery

## 2020-07-01 ENCOUNTER — Other Ambulatory Visit: Payer: Self-pay

## 2020-07-01 ENCOUNTER — Ambulatory Visit (INDEPENDENT_AMBULATORY_CARE_PROVIDER_SITE_OTHER): Payer: 59 | Admitting: Surgery

## 2020-07-01 VITALS — BP 173/88 | HR 80 | Temp 97.9°F | Ht 65.0 in | Wt 138.2 lb

## 2020-07-01 DIAGNOSIS — K432 Incisional hernia without obstruction or gangrene: Secondary | ICD-10-CM | POA: Diagnosis not present

## 2020-07-01 NOTE — Progress Notes (Signed)
Patient ID: Julia [name], female   DOB: 23-Jun-1960, 60 y.o.   MRN: [id number]  Chief Complaint: Abdominal wall hernia  History of Present Illness She successfully quit smoking as of Christmas last year.  She continues without nausea, vomiting, fevers or chills.  She reports her bowel movements are normal.  She is still strongly motivated to pursue hernia repair. We discussed the challenges of such a large rectus defect that appears to be up to the costal margins and xiphoid area in the epigastrium.  She has had previous hernia repairs.  This may require an open operation with component separation, and I advised we may get her plugged in with someone who has done more of this type of repair.  In the interim we will proceed with a better evaluation utilizing CT scanning to better plan surgery. Previously: Julia [name] is a 60 y.o. 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  She has a history of prior hernia repair surgery.  She reports having her current abdominal wall hernia for 10 years.  Its become more painful and bigger over time.  She is concerned that she may be developing more/another.  She denies fevers or chills.  She denies vomiting.  She reports occasional nausea, and regular bowel activity.  She has had no prior work-up to anticipate repair of her hernia.  She has been whittling down her smoking to about 2 to 3 cigarettes daily, and now appears to be much more motivated to completely be rid of smoking and nicotine consumption.  Past Medical History Past Medical History:  Diagnosis Date  . Anemia   . Anxiety   . Asthma   . COPD (chronic obstructive pulmonary disease) (HCC)   . Depression   . Ventral hernia       Past Surgical History:  Procedure Laterality Date  . COLON SURGERY    . EAR TUBE REMOVAL    . FRACTURE SURGERY    . HERNIA REPAIR    . TUBAL LIGATION      Allergies  Allergen  Reactions  . Atrovent [Ipratropium] Shortness Of Breath    Current Outpatient Medications  Medication Sig Dispense Refill  . acetylcysteine (MUCOMYST) 20 % nebulizer solution Take 4 mLs by nebulization 2 (two) times daily. 30 mL 12  . albuterol (PROVENTIL HFA;VENTOLIN HFA) 108 (90 BASE) MCG/ACT inhaler Inhale 2 puffs into the lungs every 6 (six) hours as needed for wheezing or shortness of breath. 1 Inhaler 2  . albuterol (PROVENTIL) (2.5 MG/3ML) 0.083% nebulizer solution Take 2.5 mg by nebulization every 6 (six) hours as needed for wheezing or shortness of breath.    . ALPRAZolam (XANAX) 0.5 MG tablet Take 0.5 mg by mouth at bedtime.    . docusate sodium (COLACE) 100 MG capsule Take 100 mg by mouth daily as needed for mild constipation.    . ferrous sulfate 325 (65 FE) MG tablet Take 1 tablet (325 mg total) by mouth 2 (two) times daily with a meal. 60 tablet 1  . Fluticasone-Umeclidin-Vilant (TRELEGY ELLIPTA IN) Inhale into the lungs.    . magnesium oxide (MAG-OX) 400 MG tablet Take 400 mg by mouth at bedtime.     . pantoprazole (PROTONIX) 40 MG tablet Take 40 mg by mouth daily.    . Potassium 99 MG TABS Take 1 tablet by mouth daily.    . sertraline (ZOLOFT) 100 MG tablet Take 100 mg by mouth daily.  No current facility-administered medications for this visit.    Family History Family History  Problem Relation Age of Onset  . Sarcoidosis Mother   . Alcohol abuse Father       Social History Social History   Tobacco Use  . Smoking status: Former Smoker    Packs/day: 0.50    Types: Cigarettes    Quit date: 04/25/2020    Years since quitting: 0.1  . Smokeless tobacco: Never Used  Substance Use Topics  . Alcohol use: No  . Drug use: No        Review of Systems  Constitutional: Negative for chills, diaphoresis, fever, malaise/fatigue and weight loss.  HENT: Positive for tinnitus.   Eyes: Negative.   Respiratory: Positive for shortness of breath and wheezing.    Cardiovascular: Negative.   Gastrointestinal: Positive for abdominal pain, heartburn, melena and nausea. Negative for constipation, diarrhea and vomiting.  Genitourinary: Negative.   Skin: Negative.   Neurological: Negative.   Psychiatric/Behavioral: Positive for depression.      Physical Exam Blood pressure (!) 173/88, pulse 80, temperature 97.9 F (36.6 C), temperature source Oral, height 5\' 5"  (1.651 m), weight 138 lb 3.2 oz (62.7 kg), SpO2 97 %. Last Weight  Most recent update: 07/01/2020 10:12 AM   Weight  62.7 kg (138 lb 3.2 oz)            CONSTITUTIONAL: Well developed, and nourished, appropriately responsive and aware without distress.   EYES: Sclera non-icteric.   EARS, NOSE, MOUTH AND THROAT: Mask worn.    Hearing is intact to voice.  NECK: Trachea is midline, and there is no jugular venous distension.  LYMPH NODES:  Lymph nodes in the neck are not enlarged. RESPIRATORY:  Lungs are clear, and breath sounds are equal bilaterally. Normal respiratory effort without pathologic use of accessory muscles. CARDIOVASCULAR: Heart is regular in rate and rhythm. GI: The abdomen is soft, nontender, and nondistended.  There is a rather prominent bulge in the upper midline, soft with intra-abdominal contents present.  Due to the amount of extracorporal viscera, it is difficult to fully appreciate the fascial perimeter, I suspect from the left costal margin to near the umbilicus and estimated as wide as 10 cm from the edge of the right rectus to the edge of the left rectus.  Is entirely epigastric.  There were no other palpable masses. MUSCULOSKELETAL:  Symmetrical muscle tone appreciated in all four extremities.    SKIN: Skin turgor is normal. No pathologic skin lesions appreciated.  NEUROLOGIC:  Motor and sensation appear grossly normal.  Cranial nerves are grossly without defect. PSYCH:  Alert and oriented to person, place and time. Affect is appropriate for situation.  Data Reviewed I  have personally reviewed what is currently available of the patient's imaging, recent labs and medical records.   Labs: None current    Imaging: Nothing recent. Within last 24 hrs: No results found.  Assessment    Large incisional hernia, significant separation of midline rectus sheath. Patient Active Problem List   Diagnosis Date Noted  . Incisional hernia, without obstruction or gangrene 04/22/2020  . Acute respiratory failure (HCC) 11/08/2015  . COPD exacerbation (HCC) 04/07/2015    Plan    Complete evaluation with CT scan. We discussed the possibility of referral to hernia specialist as this will likely require component separation, and may eventually necessitate use of bridging mesh.   We discussed referral to [facility] for the hernia center there, possibility of returning to [facility] though she  has not had the best of experiences there. Face-to-face time spent with the patient and accompanying care providers(if present) was 20 minutes, with more than 50% of the time spent counseling, educating, and coordinating care of the patient.      Campbell Lerner M.D., FACS 07/01/2020, 10:39 AM

## 2020-07-01 NOTE — Patient Instructions (Signed)
Your CT appointment is 07/10/20 @ [facility] in [place] at 9:30 am (arrive by 9:15 am), nothing to eat or drink 4 hours prior to scan. Please pick up the oral contrast at [facility] or [facility] between now and 07/09/20. You will drink 1 bottle at 7:30 am and one bottle at 8:30 am on 07/10/20. If you have any concerns or questions, please feel free to call our office.

## 2020-07-02 ENCOUNTER — Telehealth: Payer: Self-pay | Admitting: Pharmacy Technician

## 2020-07-02 NOTE — Telephone Encounter (Signed)
Patient has provided poi.  Still needs to provide checking account statement, patient intake application, pt advocate form, [employer].  Sherilyn Dacosta Care Manager [facility]

## 2020-07-02 NOTE — Telephone Encounter (Signed)
Patient has prescription drug coverage with [facility].  No longer meets [facility]'s eligibility criteria.  Pt notified by letter.  Sherilyn Dacosta Care Manager [facility]

## 2020-07-10 ENCOUNTER — Inpatient Hospital Stay: Admission: RE | Admit: 2020-07-10 | Payer: Self-pay | Source: Ambulatory Visit

## 2020-07-11 ENCOUNTER — Ambulatory Visit: Payer: Self-pay

## 2020-07-23 ENCOUNTER — Other Ambulatory Visit
Admission: RE | Admit: 2020-07-23 | Discharge: 2020-07-23 | Disposition: A | Discharge status: Home / Self Care | Payer: 59 | Source: Home / Self Care | Attending: Surgery | Admitting: Surgery

## 2020-07-23 ENCOUNTER — Other Ambulatory Visit: Payer: Self-pay

## 2020-07-23 ENCOUNTER — Ambulatory Visit
Admission: RE | Admit: 2020-07-23 | Discharge: 2020-07-23 | Disposition: A | Discharge status: Home / Self Care | Payer: 59 | Source: Ambulatory Visit | Attending: Surgery | Admitting: Surgery

## 2020-07-23 DIAGNOSIS — K432 Incisional hernia without obstruction or gangrene: Secondary | ICD-10-CM | POA: Insufficient documentation

## 2020-07-23 LAB — BASIC METABOLIC PANEL
Anion gap: 10 (ref 5–15)
BUN: 18 mg/dL (ref 6–20)
CO2: 26 mmol/L (ref 22–32)
Calcium: 10 mg/dL (ref 8.9–10.3)
Chloride: 102 mmol/L (ref 98–111)
Creatinine, Ser: 0.84 mg/dL (ref 0.44–1.00)
GFR, Estimated: >60 mL/min (ref >60)
Glucose, Bld: 129 mg/dL — ABNORMAL HIGH (ref 70–99)
Potassium: 4.1 mmol/L (ref 3.5–5.1)
Sodium: 138 mmol/L (ref 135–145)

## 2020-07-23 MED ORDER — IOHEXOL 300 MG/ML  SOLN
100.0000 mL | Freq: Once | INTRAMUSCULAR | Status: AC | PRN
  Administered 2020-07-23: 100 mL via INTRAVENOUS

## 2020-08-12 ENCOUNTER — Telehealth: Payer: Self-pay | Admitting: *Deleted

## 2020-08-12 NOTE — Telephone Encounter (Signed)
Patient called to see if Dr Claudine Mouton has reviewed her CT scan that she had done on 07/23/20 which would determine if she can have surgery or not. Please call and advise.

## 2020-08-14 NOTE — Telephone Encounter (Signed)
Spoke with Julia [name] and her husband, reporting the significant hiatal hernia in addition to her 2 ventral hernias.  Although her BMI numbers are low, I believe there is still a significant intra-abdominal compartment pressure that needs to be alleviated prior to considering any elective surgeries.  Also due to the number of hernias present, I feel it will benefit her to go to a hernia specialist, that also does hiatal hernia repairs.  I advised that she stop vaping as well.  I believe she should focus on weight loss at present, hopefully deferring any elective operation for now.  And we will look at where it is accessible to her to find the specialist.

## 2020-12-22 ENCOUNTER — Other Ambulatory Visit: Payer: Self-pay | Admitting: Nurse Practitioner

## 2020-12-22 DIAGNOSIS — Z1231 Encounter for screening mammogram for malignant neoplasm of breast: Secondary | ICD-10-CM

## 2021-01-08 ENCOUNTER — Other Ambulatory Visit: Payer: Self-pay

## 2021-01-08 ENCOUNTER — Ambulatory Visit
Admission: RE | Admit: 2021-01-08 | Discharge: 2021-01-08 | Disposition: A | Discharge status: Home / Self Care | Payer: 59 | Source: Ambulatory Visit | Attending: Nurse Practitioner | Admitting: Nurse Practitioner

## 2021-01-08 DIAGNOSIS — Z1231 Encounter for screening mammogram for malignant neoplasm of breast: Secondary | ICD-10-CM | POA: Diagnosis not present

## 2021-01-12 ENCOUNTER — Other Ambulatory Visit: Payer: Self-pay | Admitting: *Deleted

## 2021-01-12 ENCOUNTER — Inpatient Hospital Stay
Admission: RE | Admit: 2021-01-12 | Discharge: 2021-01-12 | Disposition: A | Discharge status: Home / Self Care | Payer: Self-pay | Source: Ambulatory Visit | Attending: *Deleted | Admitting: *Deleted

## 2021-01-12 DIAGNOSIS — Z1231 Encounter for screening mammogram for malignant neoplasm of breast: Secondary | ICD-10-CM

## 2021-01-21 ENCOUNTER — Other Ambulatory Visit: Payer: Self-pay | Admitting: Pulmonary Disease

## 2021-01-21 DIAGNOSIS — Z87891 Personal history of nicotine dependence: Secondary | ICD-10-CM

## 2021-01-21 DIAGNOSIS — J449 Chronic obstructive pulmonary disease, unspecified: Secondary | ICD-10-CM

## 2021-02-10 ENCOUNTER — Ambulatory Visit
Admission: RE | Admit: 2021-02-10 | Discharge: 2021-02-10 | Disposition: A | Discharge status: Home / Self Care | Payer: 59 | Source: Ambulatory Visit | Attending: Pulmonary Disease | Admitting: Pulmonary Disease

## 2021-02-10 ENCOUNTER — Other Ambulatory Visit: Payer: Self-pay

## 2021-02-10 DIAGNOSIS — Z87891 Personal history of nicotine dependence: Secondary | ICD-10-CM | POA: Diagnosis not present

## 2021-02-10 DIAGNOSIS — J449 Chronic obstructive pulmonary disease, unspecified: Secondary | ICD-10-CM | POA: Insufficient documentation

## 2021-03-18 ENCOUNTER — Inpatient Hospital Stay
Admission: EM | Admit: 2021-03-18 | Discharge: 2021-03-20 | DRG: 871 | Disposition: A | Discharge status: Home / Self Care | Payer: 59 | Attending: Internal Medicine | Admitting: Internal Medicine

## 2021-03-18 ENCOUNTER — Emergency Department: Payer: 59

## 2021-03-18 ENCOUNTER — Other Ambulatory Visit: Payer: Self-pay

## 2021-03-18 DIAGNOSIS — F418 Other specified anxiety disorders: Secondary | ICD-10-CM | POA: Diagnosis not present

## 2021-03-18 DIAGNOSIS — F17290 Nicotine dependence, other tobacco product, uncomplicated: Secondary | ICD-10-CM | POA: Diagnosis present

## 2021-03-18 DIAGNOSIS — A419 Sepsis, unspecified organism: Principal | ICD-10-CM | POA: Diagnosis present

## 2021-03-18 DIAGNOSIS — J9621 Acute and chronic respiratory failure with hypoxia: Secondary | ICD-10-CM | POA: Diagnosis present

## 2021-03-18 DIAGNOSIS — Z91018 Allergy to other foods: Secondary | ICD-10-CM

## 2021-03-18 DIAGNOSIS — J449 Chronic obstructive pulmonary disease, unspecified: Secondary | ICD-10-CM | POA: Diagnosis present

## 2021-03-18 DIAGNOSIS — J441 Chronic obstructive pulmonary disease with (acute) exacerbation: Secondary | ICD-10-CM | POA: Diagnosis present

## 2021-03-18 DIAGNOSIS — F419 Anxiety disorder, unspecified: Secondary | ICD-10-CM | POA: Diagnosis present

## 2021-03-18 DIAGNOSIS — R6520 Severe sepsis without septic shock: Secondary | ICD-10-CM | POA: Diagnosis present

## 2021-03-18 DIAGNOSIS — J9601 Acute respiratory failure with hypoxia: Secondary | ICD-10-CM | POA: Diagnosis present

## 2021-03-18 DIAGNOSIS — D509 Iron deficiency anemia, unspecified: Secondary | ICD-10-CM

## 2021-03-18 DIAGNOSIS — R0603 Acute respiratory distress: Secondary | ICD-10-CM | POA: Diagnosis not present

## 2021-03-18 DIAGNOSIS — K219 Gastro-esophageal reflux disease without esophagitis: Secondary | ICD-10-CM | POA: Diagnosis present

## 2021-03-18 DIAGNOSIS — J440 Chronic obstructive pulmonary disease with acute lower respiratory infection: Secondary | ICD-10-CM | POA: Diagnosis present

## 2021-03-18 DIAGNOSIS — Z811 Family history of alcohol abuse and dependence: Secondary | ICD-10-CM

## 2021-03-18 DIAGNOSIS — J129 Viral pneumonia, unspecified: Secondary | ICD-10-CM | POA: Diagnosis present

## 2021-03-18 DIAGNOSIS — R911 Solitary pulmonary nodule: Secondary | ICD-10-CM | POA: Diagnosis present

## 2021-03-18 DIAGNOSIS — Z20822 Contact with and (suspected) exposure to covid-19: Secondary | ICD-10-CM | POA: Diagnosis present

## 2021-03-18 DIAGNOSIS — R778 Other specified abnormalities of plasma proteins: Secondary | ICD-10-CM | POA: Diagnosis not present

## 2021-03-18 DIAGNOSIS — Z9981 Dependence on supplemental oxygen: Secondary | ICD-10-CM

## 2021-03-18 DIAGNOSIS — J189 Pneumonia, unspecified organism: Secondary | ICD-10-CM | POA: Diagnosis present

## 2021-03-18 DIAGNOSIS — F32A Depression, unspecified: Secondary | ICD-10-CM | POA: Diagnosis present

## 2021-03-18 DIAGNOSIS — Z79899 Other long term (current) drug therapy: Secondary | ICD-10-CM

## 2021-03-18 HISTORY — DX: Severe sepsis without septic shock: R65.20

## 2021-03-18 HISTORY — DX: Solitary pulmonary nodule: R91.1

## 2021-03-18 HISTORY — DX: Iron deficiency anemia, unspecified: D50.9

## 2021-03-18 LAB — RESP PANEL BY RT-PCR (FLU A&B, COVID) ARPGX2
Influenza A by PCR: NEGATIVE
Influenza B by PCR: NEGATIVE
SARS Coronavirus 2 by RT PCR: NEGATIVE

## 2021-03-18 LAB — COMPREHENSIVE METABOLIC PANEL
ALT: 14 U/L (ref 0–44)
AST: 22 U/L (ref 15–41)
Albumin: 3.8 g/dL (ref 3.5–5.0)
Alkaline Phosphatase: 56 U/L (ref 38–126)
Anion gap: 13 (ref 5–15)
BUN: 16 mg/dL (ref 8–23)
CO2: 23 mmol/L (ref 22–32)
Calcium: 9.9 mg/dL (ref 8.9–10.3)
Chloride: 103 mmol/L (ref 98–111)
Creatinine, Ser: 0.78 mg/dL (ref 0.44–1.00)
GFR, Estimated: >60 mL/min (ref >60)
Glucose, Bld: 126 mg/dL — ABNORMAL HIGH (ref 70–99)
Potassium: 4.3 mmol/L (ref 3.5–5.1)
Sodium: 139 mmol/L (ref 135–145)
Total Bilirubin: 0.2 mg/dL — ABNORMAL LOW (ref 0.3–1.2)
Total Protein: 7.5 g/dL (ref 6.5–8.1)

## 2021-03-18 LAB — CBC WITH DIFFERENTIAL/PLATELET
Abs Immature Granulocytes: 0.05 10*3/uL (ref 0.00–0.07)
Basophils Absolute: 0.1 10*3/uL (ref 0.0–0.1)
Basophils Relative: 0 %
Eosinophils Absolute: 0.2 10*3/uL (ref 0.0–0.5)
Eosinophils Relative: 1 %
HCT: 35.1 % — ABNORMAL LOW (ref 36.0–46.0)
Hemoglobin: 10.8 g/dL — ABNORMAL LOW (ref 12.0–15.0)
Immature Granulocytes: 0 %
Lymphocytes Relative: 15 %
Lymphs Abs: 2.2 10*3/uL (ref 0.7–4.0)
MCH: 25.9 pg — ABNORMAL LOW (ref 26.0–34.0)
MCHC: 30.8 g/dL (ref 30.0–36.0)
MCV: 84.2 fL (ref 80.0–100.0)
Monocytes Absolute: 0.7 10*3/uL (ref 0.1–1.0)
Monocytes Relative: 5 %
Neutro Abs: 11.7 10*3/uL — ABNORMAL HIGH (ref 1.7–7.7)
Neutrophils Relative %: 79 %
Platelets: 322 10*3/uL (ref 150–400)
RBC: 4.17 MIL/uL (ref 3.87–5.11)
RDW: 16.3 % — ABNORMAL HIGH (ref 11.5–15.5)
WBC: 14.9 10*3/uL — ABNORMAL HIGH (ref 4.0–10.5)
nRBC: 0 % (ref 0.0–0.2)

## 2021-03-18 LAB — PROTIME-INR
INR: 1 (ref 0.8–1.2)
Prothrombin Time: 12.8 seconds (ref 11.4–15.2)

## 2021-03-18 LAB — RESPIRATORY PANEL BY PCR

## 2021-03-18 LAB — LACTIC ACID, PLASMA
Lactic Acid, Venous: 2.5 mmol/L (ref 0.5–1.9)
Lactic Acid, Venous: 0.8 mmol/L (ref 0.5–1.9)

## 2021-03-18 LAB — BRAIN NATRIURETIC PEPTIDE: B Natriuretic Peptide: 70.9 pg/mL (ref 0.0–100.0)

## 2021-03-18 LAB — APTT: aPTT: 31 seconds (ref 24–36)

## 2021-03-18 LAB — TROPONIN I (HIGH SENSITIVITY)
Troponin I (High Sensitivity): 4 ng/L (ref <18)
Troponin I (High Sensitivity): 5 ng/L (ref <18)

## 2021-03-18 LAB — HIV ANTIBODY (ROUTINE TESTING W REFLEX): HIV Screen 4th Generation wRfx: NONREACTIVE

## 2021-03-18 LAB — PROCALCITONIN: Procalcitonin: <0.1 ng/mL

## 2021-03-18 MED ORDER — MAGNESIUM OXIDE 400 MG PO TABS
400.0000 mg | ORAL_TABLET | Freq: Every day | ORAL | Status: DC
  Administered 2021-03-18 – 2021-03-20 (×3): 400 mg via ORAL
  Filled 2021-03-18 (×6): qty 1

## 2021-03-18 MED ORDER — LACTATED RINGERS IV BOLUS (SEPSIS)
1000.0000 mL | Freq: Once | INTRAVENOUS | Status: AC
  Administered 2021-03-18: 1000 mL via INTRAVENOUS

## 2021-03-18 MED ORDER — ALBUTEROL SULFATE (2.5 MG/3ML) 0.083% IN NEBU
2.5000 mg | INHALATION_SOLUTION | RESPIRATORY_TRACT | Status: DC | PRN
  Administered 2021-03-19: 13:00:00 2.5 mg via RESPIRATORY_TRACT
  Filled 2021-03-18 (×2): qty 3

## 2021-03-18 MED ORDER — DM-GUAIFENESIN ER 30-600 MG PO TB12
1.0000 | ORAL_TABLET | Freq: Two times a day (BID) | ORAL | Status: DC | PRN
  Administered 2021-03-20: 1 via ORAL
  Filled 2021-03-18 (×2): qty 1

## 2021-03-18 MED ORDER — GABAPENTIN 300 MG PO CAPS
300.0000 mg | ORAL_CAPSULE | Freq: Three times a day (TID) | ORAL | Status: DC
  Administered 2021-03-18 – 2021-03-20 (×7): 300 mg via ORAL
  Filled 2021-03-18 (×7): qty 1

## 2021-03-18 MED ORDER — FERROUS SULFATE 325 (65 FE) MG PO TABS
650.0000 mg | ORAL_TABLET | Freq: Every day | ORAL | Status: DC
  Administered 2021-03-18 – 2021-03-20 (×3): 650 mg via ORAL
  Filled 2021-03-18 (×3): qty 2

## 2021-03-18 MED ORDER — VANCOMYCIN HCL 1500 MG/300ML IV SOLN
1500.0000 mg | Freq: Once | INTRAVENOUS | Status: AC
  Administered 2021-03-18: 1500 mg via INTRAVENOUS
  Filled 2021-03-18: qty 300

## 2021-03-18 MED ORDER — SODIUM CHLORIDE 0.9 % IV SOLN
2.0000 g | Freq: Once | INTRAVENOUS | Status: AC
  Administered 2021-03-18: 2 g via INTRAVENOUS
  Filled 2021-03-18: qty 2

## 2021-03-18 MED ORDER — PANTOPRAZOLE SODIUM 40 MG PO TBEC
40.0000 mg | DELAYED_RELEASE_TABLET | Freq: Every day | ORAL | Status: DC
  Administered 2021-03-18 – 2021-03-20 (×3): 40 mg via ORAL
  Filled 2021-03-18 (×3): qty 1

## 2021-03-18 MED ORDER — SODIUM CHLORIDE 0.9 % IV SOLN
1.0000 g | INTRAVENOUS | Status: DC
  Administered 2021-03-18 – 2021-03-19 (×2): 1 g via INTRAVENOUS
  Filled 2021-03-18 (×2): qty 10

## 2021-03-18 MED ORDER — LACTATED RINGERS IV SOLN: INTRAVENOUS | Status: DC

## 2021-03-18 MED ORDER — SODIUM CHLORIDE 0.9 % IV SOLN
500.0000 mg | INTRAVENOUS | Status: DC
  Administered 2021-03-18 – 2021-03-20 (×3): 500 mg via INTRAVENOUS
  Filled 2021-03-18 (×4): qty 500

## 2021-03-18 MED ORDER — DOCUSATE SODIUM 100 MG PO CAPS: 100.0000 mg | ORAL_CAPSULE | Freq: Every day | ORAL | Status: DC | PRN

## 2021-03-18 MED ORDER — SERTRALINE HCL 50 MG PO TABS
150.0000 mg | ORAL_TABLET | Freq: Every day | ORAL | Status: DC
  Administered 2021-03-18 – 2021-03-20 (×3): 150 mg via ORAL
  Filled 2021-03-18 (×3): qty 3

## 2021-03-18 MED ORDER — ALPRAZOLAM 0.5 MG PO TABS
0.2500 mg | ORAL_TABLET | Freq: Two times a day (BID) | ORAL | Status: DC | PRN
Start: 2021-03-18 — End: 2021-03-18

## 2021-03-18 MED ORDER — METHYLPREDNISOLONE SODIUM SUCC 125 MG IJ SOLR
80.0000 mg | Freq: Every day | INTRAMUSCULAR | Status: DC
  Administered 2021-03-18 – 2021-03-20 (×3): 80 mg via INTRAVENOUS
  Filled 2021-03-18 (×3): qty 2

## 2021-03-18 MED ORDER — ALBUTEROL SULFATE (2.5 MG/3ML) 0.083% IN NEBU
5.0000 mg | INHALATION_SOLUTION | Freq: Once | RESPIRATORY_TRACT | Status: AC
  Administered 2021-03-18: 5 mg via RESPIRATORY_TRACT
  Filled 2021-03-18: qty 6

## 2021-03-18 MED ORDER — NICOTINE 21 MG/24HR TD PT24
21.0000 mg | MEDICATED_PATCH | Freq: Every day | TRANSDERMAL | Status: DC
  Filled 2021-03-18 (×3): qty 1

## 2021-03-18 MED ORDER — ONDANSETRON HCL 4 MG/2ML IJ SOLN: 4.0000 mg | Freq: Three times a day (TID) | INTRAMUSCULAR | Status: DC | PRN

## 2021-03-18 MED ORDER — ACETAMINOPHEN 325 MG PO TABS
650.0000 mg | ORAL_TABLET | Freq: Four times a day (QID) | ORAL | Status: DC | PRN
  Administered 2021-03-18 (×2): 650 mg via ORAL
  Filled 2021-03-18 (×2): qty 2

## 2021-03-18 MED ORDER — VANCOMYCIN HCL IN DEXTROSE 1-5 GM/200ML-% IV SOLN: 1000.0000 mg | Freq: Once | INTRAVENOUS | Status: DC

## 2021-03-18 MED ORDER — ACETAMINOPHEN 500 MG PO TABS
1000.0000 mg | ORAL_TABLET | Freq: Once | ORAL | Status: AC
  Administered 2021-03-18: 1000 mg via ORAL
  Filled 2021-03-18: qty 2

## 2021-03-18 MED ORDER — IPRATROPIUM-ALBUTEROL 0.5-2.5 (3) MG/3ML IN SOLN
3.0000 mL | RESPIRATORY_TRACT | Status: DC
  Administered 2021-03-18 – 2021-03-19 (×9): 3 mL via RESPIRATORY_TRACT
  Filled 2021-03-18 (×7): qty 3

## 2021-03-18 MED ORDER — ALPRAZOLAM 0.5 MG PO TABS
0.5000 mg | ORAL_TABLET | Freq: Two times a day (BID) | ORAL | Status: DC | PRN
  Administered 2021-03-18 – 2021-03-19 (×2): 0.5 mg via ORAL
  Filled 2021-03-18 (×2): qty 1

## 2021-03-18 NOTE — H&P (Signed)
History and Physical    Julia [name] [id number] DOB: 15-May-1960 DOA: 03/18/2021  Referring MD/NP/PA:   PCP: Romualdo Bolk, FNP   Patient coming from:  The patient is coming from home.  At baseline, pt is independent for most of ADL.        Chief Complaint: SOB  HPI: Julia [name] is a 60 y.o. female with medical history significant of former smoker, COPD, asthma, on 2.5 L oxygen at night, GERD, depression with anxiety, iron deficiency anemia, pulm nodule, who presents with shortness of breath.  Patient states that her symptoms started last night, including shortness breath, wheezing, cough with little yellow-colored mucus production, fever and chills.  Patient has temperature 103.2 in ED.  She also reports chest tightness and chest pain which is located in the central chest, pleuritic, mild to moderate, nonradiating.  No nausea, vomiting, diarrhea or abdominal pain.  No symptoms of UTI.  Patient states that she is using 2.5 L oxygen at night only, currently patient needs 4 liters oxygen with saturation 96% in ED.   ED Course: pt was found to have WBC 14.9, negative COVID PCR, negative flu A and B, pending RVP, lactic acid 2.5, 0.8, INR 1.0, troponin level 4, 5, BNP 70.9, electrolytes renal function okay, blood pressure 136/72, heart rate 107, RR 24.  Patient is admitted to progressive bed as inpatient.  Dr. Sedonia Small of ID is consulted  CXR showed: 1. Chronic lung disease with Progression of perihilar and bilateral lower lung reticulonodular opacity since the CT last month, with atypical/mycobacterial infectious process favored at that time. 2. Moderate hiatal hernia  Review of Systems:   General: has fevers, chills, no body weight gain, has fatigue HEENT: no blurry vision, hearing changes or sore throat Respiratory: has dyspnea, coughing, wheezing CV: has chest pain, no palpitations GI: no nausea, vomiting, abdominal pain, diarrhea, constipation GU: no dysuria,  burning on urination, increased urinary frequency, hematuria  Ext: no leg edema Neuro: no unilateral weakness, numbness, or tingling, no vision change or hearing loss Skin: no rash, no skin tear. MSK: No muscle spasm, no deformity, no limitation of range of movement in spin Heme: No easy bruising.  Travel history: No recent long distant travel.  Allergy:  Allergies  Allergen Reactions   Albumen, Egg Nausea And Vomiting   Banana Nausea And Vomiting    Past Medical History:  Diagnosis Date   Anemia    Anxiety    Asthma    COPD (chronic obstructive pulmonary disease) (HCC)    Depression    Ventral hernia     Past Surgical History:  Procedure Laterality Date   COLON SURGERY     EAR TUBE REMOVAL     FRACTURE SURGERY     HERNIA REPAIR     TUBAL LIGATION      Social History:  reports that she quit smoking about 10 months ago. Her smoking use included cigarettes. She smoked an average of .5 packs per day. She has never used smokeless tobacco. She reports that she does not drink alcohol and does not use drugs.  Family History:  Family History  Problem Relation Age of Onset   Sarcoidosis Mother    Alcohol abuse Father      Prior to Admission medications   Medication Sig Start Date End Date Taking? Authorizing Provider  acetaminophen (TYLENOL) 500 MG tablet Take 1,000 mg by mouth every 6 (six) hours as needed.   Yes [provider]  albuterol (PROVENTIL  HFA;VENTOLIN HFA) 108 (90 BASE) MCG/ACT inhaler Inhale 2 puffs into the lungs every 6 (six) hours as needed for wheezing or shortness of breath. 04/09/15  Yes Mody, Ulice Bold, MD  albuterol (PROVENTIL) (2.5 MG/3ML) 0.083% nebulizer solution Take 2.5 mg by nebulization every 6 (six) hours as needed for wheezing or shortness of breath.   Yes [provider]  ALPRAZolam (XANAX) 0.5 MG tablet Take 0.25-0.5 mg by mouth 2 (two) times daily as needed for anxiety. 12/17/20 12/17/21 Yes [provider]  docusate sodium  (COLACE) 100 MG capsule Take 100 mg by mouth daily as needed for mild constipation.   Yes [provider]  ferrous sulfate 325 (65 FE) MG tablet Take 1 tablet (325 mg total) by mouth 2 (two) times daily with a meal. Patient taking differently: Take 650 mg by mouth daily with breakfast. 11/10/15  Yes Vaughan Basta, MD  Fluticasone-Umeclidin-Vilant (TRELEGY ELLIPTA IN) Inhale 1 puff into the lungs daily.   Yes [provider]  gabapentin (NEURONTIN) 300 MG capsule Take 300 mg by mouth 3 (three) times daily. 12/17/20  Yes [provider]  ipratropium-albuterol (DUONEB) 0.5-2.5 (3) MG/3ML SOLN Take 3 mLs by nebulization as directed. 02/05/21  Yes [provider]  magnesium oxide (MAG-OX) 400 MG tablet Take 400 mg by mouth daily.   Yes [provider]  pantoprazole (PROTONIX) 40 MG tablet Take 40 mg by mouth daily.   Yes [provider]  Potassium 99 MG TABS Take 2 tablets by mouth daily.   Yes [provider]  sertraline (ZOLOFT) 100 MG tablet Take 150 mg by mouth daily.   Yes [provider]  acetylcysteine (MUCOMYST) 20 % nebulizer solution Take 4 mLs by nebulization 2 (two) times daily. Patient not taking: Reported on 03/18/2021 03/24/16   Dustin Flock, MD  VENTOLIN HFA [phone] Base) MCG/ACT inhaler INHALE 2 PUFFS INTO THE LUNGS EVERY 4 HOURS AS NEEDED FOR WHEEZING (REPLACES PROVENTIL) 11/07/19 11/06/20  Einar Crow, MD    Physical Exam: Vitals:   03/18/21 0715 03/18/21 0730 03/18/21 0830 03/18/21 1000  BP:  126/71 109/66   Pulse: 84 82 (!) 59 73  Resp: 17 (!) 24 17   Temp:      TempSrc:      SpO2: 100% 98% 100% 98%  Weight:      Height:       General: Not in acute distress HEENT:       Eyes: PERRL, EOMI, no scleral icterus.       ENT: No discharge from the ears and nose, no pharynx injection, no tonsillar enlargement.        Neck: No JVD, no bruit, no mass felt. Heme: No neck lymph node  enlargement. Cardiac: S1/S2, RRR, No murmurs, No gallops or rubs. Respiratory: has wheezing bilaterally GI: Soft, nondistended, nontender, no rebound pain, no organomegaly, BS present. GU: No hematuria Ext: No pitting leg edema bilaterally. 1+DP/PT pulse bilaterally. Musculoskeletal: No joint deformities, No joint redness or warmth, no limitation of ROM in spin. Skin: No rashes.  Neuro: Alert, oriented X3, cranial nerves II-XII grossly intact, moves all extremities normally.  Psych: Patient is not psychotic, no suicidal or hemocidal ideation.  Labs on Admission: I have personally reviewed following labs and imaging studies  CBC: Recent Labs  Lab 03/18/21 0412  WBC 14.9*  NEUTROABS 11.7*  HGB 10.8*  HCT 35.1*  MCV 84.2  PLT [id number]   Basic Metabolic Panel: Recent Labs  Lab 03/18/21 0412  NA  139  K 4.3  CL 103  CO2 23  GLUCOSE 126*  BUN 16  CREATININE 0.78  CALCIUM 9.9   GFR: Estimated Creatinine Clearance: 66.5 mL/min (by C-G formula based on SCr of 0.78 mg/dL). Liver Function Tests: Recent Labs  Lab 03/18/21 0412  AST 22  ALT 14  ALKPHOS 56  BILITOT 0.2*  PROT 7.5  ALBUMIN 3.8   No results for input(s): LIPASE, AMYLASE in the last 168 hours. No results for input(s): AMMONIA in the last 168 hours. Coagulation Profile: Recent Labs  Lab 03/18/21 0412  INR 1.0   Cardiac Enzymes: No results for input(s): CKTOTAL, CKMB, CKMBINDEX, TROPONINI in the last 168 hours. BNP (last 3 results) No results for input(s): PROBNP in the last 8760 hours. HbA1C: No results for input(s): HGBA1C in the last 72 hours. CBG: No results for input(s): GLUCAP in the last 168 hours. Lipid Profile: No results for input(s): CHOL, HDL, LDLCALC, TRIG, CHOLHDL, LDLDIRECT in the last 72 hours. Thyroid Function Tests: No results for input(s): TSH, T4TOTAL, FREET4, T3FREE, THYROIDAB in the last 72 hours. Anemia Panel: No results for input(s): VITAMINB12, FOLATE, FERRITIN, TIBC, IRON,  RETICCTPCT in the last 72 hours. Urine analysis:    Component Value Date/Time   COLORURINE STRAW (A) 03/01/2016 1935   APPEARANCEUR CLEAR (A) 03/01/2016 1935   LABSPEC 1.016 03/01/2016 1935   PHURINE 6.0 03/01/2016 1935   GLUCOSEU >500 (A) 03/01/2016 1935   HGBUR NEGATIVE 03/01/2016 1935   BILIRUBINUR NEGATIVE 03/01/2016 1935   KETONESUR NEGATIVE 03/01/2016 1935   PROTEINUR NEGATIVE 03/01/2016 1935   NITRITE NEGATIVE 03/01/2016 1935   LEUKOCYTESUR NEGATIVE 03/01/2016 1935   Sepsis Labs: @LABRCNTIP (procalcitonin:4,lacticidven:4) ) Recent Results (from the past 240 hour(s))  Resp Panel by RT-PCR (Flu A&B, Covid) Nasopharyngeal Swab     Status: None   Collection Time: 03/18/21  4:12 AM   Specimen: Nasopharyngeal Swab; Nasopharyngeal(NP) swabs in vial transport medium  Result Value Ref Range Status   SARS Coronavirus 2 by RT PCR NEGATIVE NEGATIVE Final    Comment: (NOTE) SARS-CoV-2 target nucleic acids are NOT DETECTED.  The SARS-CoV-2 RNA is generally detectable in upper respiratory specimens during the acute phase of infection. The lowest concentration of SARS-CoV-2 viral copies this assay can detect is 138 copies/mL. A negative result does not preclude SARS-Cov-2 infection and should not be used as the sole basis for treatment or other patient management decisions. A negative result may occur with  improper specimen collection/handling, submission of specimen other than nasopharyngeal swab, presence of viral mutation(s) within the areas targeted by this assay, and inadequate number of viral copies(<138 copies/mL). A negative result must be combined with clinical observations, patient history, and epidemiological information. The expected result is Negative.  Fact Sheet for Patients:  03/20/21  Fact Sheet for Healthcare Providers:  BloggerCourse.com  This test is no t yet approved or cleared by the [employer] FDA  and  has been authorized for detection and/or diagnosis of SARS-CoV-2 by FDA under an Emergency Use Authorization (EUA). This EUA will remain  in effect (meaning this test can be used) for the duration of the COVID-19 declaration under Section 564(b)(1) of the Act, 21 U.S.C.section 360bbb-3(b)(1), unless the authorization is terminated  or revoked sooner.       Influenza A by PCR NEGATIVE NEGATIVE Final   Influenza B by PCR NEGATIVE NEGATIVE Final    Comment: (NOTE) The Xpert Xpress SARS-CoV-2/FLU/RSV plus assay is intended as an aid in the diagnosis of influenza from  Nasopharyngeal swab specimens and should not be used as a sole basis for treatment. Nasal washings and aspirates are unacceptable for Xpert Xpress SARS-CoV-2/FLU/RSV testing.  Fact Sheet for Patients: EntrepreneurPulse.com.au  Fact Sheet for Healthcare Providers: IncredibleEmployment.be  This test is not yet approved or cleared by the [place] FDA and has been authorized for detection and/or diagnosis of SARS-CoV-2 by FDA under an Emergency Use Authorization (EUA). This EUA will remain in effect (meaning this test can be used) for the duration of the COVID-19 declaration under Section 564(b)(1) of the Act, 21 U.S.C. section 360bbb-3(b)(1), unless the authorization is terminated or revoked.  Performed at [facility], [address] [place], [place] [address]   Blood Culture (routine x 2)     Status: None (Preliminary result)   Collection Time: 03/18/21  4:12 AM   Specimen: BLOOD  Result Value Ref Range Status   Specimen Description BLOOD RIGHT HAND  Final   Special Requests   Final    BOTTLES DRAWN AEROBIC AND ANAEROBIC Blood Culture results may not be optimal due to an inadequate volume of blood received in culture bottles   Culture   Final    NO GROWTH < 12 HOURS Performed at [facility], [address], [place], [place] [address]    Report  Status PENDING  Incomplete  Blood Culture (routine x 2)     Status: None (Preliminary result)   Collection Time: 03/18/21  4:12 AM   Specimen: BLOOD  Result Value Ref Range Status   Specimen Description BLOOD LEFT AC  Final   Special Requests   Final    BOTTLES DRAWN AEROBIC AND ANAEROBIC Blood Culture results may not be optimal due to an inadequate volume of blood received in culture bottles   Culture   Final    NO GROWTH < 12 HOURS Performed at [facility], [address], [place], [place] [address]    Report Status PENDING  Incomplete     Radiological Exams on Admission: DG Chest Portable 1 View  Result Date: 03/18/2021 CLINICAL DATA:  60 year old female with shortness of breath.  COPD. EXAM: PORTABLE CHEST 1 VIEW COMPARISON:  Chest CT 02/10/2021 and earlier. FINDINGS: Portable AP upright view at 0457 hours. Pulmonary hyperinflation. Stable mediastinal contours with moderate gastric hiatal hernia, mild cardiomegaly. Visualized tracheal air column is within normal limits. No pneumothorax. No pulmonary edema, pleural effusion or consolidation identified. However, there is ongoing widespread left perihilar and bilateral lower lung reticulonodular opacity which appears progressed from the CT scout view last month. Chronic right lateral rib fractures. No acute osseous abnormality identified. Paucity of bowel gas in the upper abdomen. IMPRESSION: 1. Chronic lung disease with Progression of perihilar and bilateral lower lung reticulonodular opacity since the CT last month, with atypical/mycobacterial infectious process favored at that time. 2. Moderate hiatal hernia. Electronically Signed   By: Genevie Ann M.D.   On: 03/18/2021 05:19     EKG: I have personally reviewed.  Sinus rhythm, QTC 389, LAE, RAD, low voltage, poor R wave progression, T wave inversion in lead III/aVF  Assessment/Plan Principal Problem:   COPD exacerbation (HCC) Active Problems:   Acute respiratory failure with  hypoxia (HCC)   Lung nodule   Atypical pneumonia   Severe sepsis (HCC)   Depression with anxiety   Iron deficiency anemia  Acute respiratory failure with hypoxia and severe sepsis due to COPD exacerbation and atypical pneumonia: Patient meets criteria for severe sepsis with WBC 14.9, fever of  103.2, tachycardia with heart rate 107, tachypnea with RR 24.  Lactic acid is elevated up to 2.5, which is normalized now 0.8. Dr. Delaine Lame of ID is consulted.  -Admited to progressive unit as inpatient - requested negative pressure room -AFB smear and acid fast cultture -incentive spirometry -IV Rocephin and azithromycin (patient received 1 dose of vancomycin and cefepime in ED) -SoluMedrol 40 mg twice daily - Mucinex for cough   -Bronchodilators - Urine legionella and S. pneumococcal antigen - Follow up blood culture x2, sputum culture and respiratory virus panel - will get Procalcitonin - IVF: 2L of LR bolus in ED, followed by 75 mL per hour of NS   Lung nodule: pt had CT scan on 02/10/21. -need to f/u with PCP closely. Pt is aware of this issue and will f/u with PCP  CT of chest on 02/10/21:  Lung-RADS 4A, suspicious. Follow up low-dose chest CT without contrast in 3 months (please use the following order, "CT CHEST LCS NODULE FOLLOW-UP W/O CM") is recommended. Alternatively, PET may be considered when there is a solid component 61mm or larger. Extensive peribronchovascular nodularity and nodular consolidation are likely due to an atypical/mycobacterial infectious process. Largest nodule measures 9.2 mm in the right lower lobe. Follow-up low-dose lung cancer screening CT in 3 months is recommended over PET, given the suspected infectious/inflammatory nature of the findings. Malignancy cannot be excluded.  Depression with anxiety -Continue home medications  Iron deficiency anemia: Hemoglobin stable, 11.8 today -Continue home iron supplement         DVT ppx: SQ Lovenox Code  Status: Full code Family Communication:  Yes, patient's   husband at bed side Disposition Plan:  Anticipate discharge back to previous environment Consults called:  Dr. Steva Ready of ID Admission status and Level of care: Progressive:     as inpt       Status is: Inpatient  Remains inpatient appropriate because: Patient has multiple comorbidities, now presents with severe sepsis due to COPD exacerbation and atypical pneumonia.  Lactic acid is up to 2.5.  Patient also has increased oxygen requirement today.  Her presentation is highly complicated, he is at high risk of deteriorating.  Will need to be treated in hospital for at least 2 days.         Date of Service 03/18/2021    [facility]   If 7PM-7AM, please contact night-coverage [email or web address] 03/18/2021, 10:22 AM

## 2021-03-18 NOTE — ED Provider Notes (Signed)
[facility] Emergency Department Provider Note  ____________________________________________   Event Date/Time   First MD Initiated Contact with Patient 03/18/21 [phone]     (approximate)  I have reviewed the triage vital signs and the nursing notes.   HISTORY  Chief Complaint Shortness of Breath    HPI Julia [name] is a 60 y.o. female with history of COPD who still smokes and does not wear oxygen at home chronically who presents to the emergency department shortness of breath, wheezing, chills that started today.  No known fevers.  Reports diffuse chest tightness.  No known fevers.  Has chronic cough which is unchanged.  No sputum production.  No lower extremity swelling or pain.  No history of PE or DVT.  Sats with EMS were 84% on room air.  Received 2 DuoNebs with EMS and 125 mg of IV Solu-Medrol.  On room air here patient was satting 88%.        Past Medical History:  Diagnosis Date   Anemia    Anxiety    Asthma    COPD (chronic obstructive pulmonary disease) (HCC)    Depression    Ventral hernia     Patient Active Problem List   Diagnosis Date Noted   Incisional hernia, without obstruction or gangrene 04/22/2020   Acute respiratory failure (HCC) 11/08/2015   COPD exacerbation (HCC) 04/07/2015    Past Surgical History:  Procedure Laterality Date   COLON SURGERY     EAR TUBE REMOVAL     FRACTURE SURGERY     HERNIA REPAIR     TUBAL LIGATION      Prior to Admission medications   Medication Sig Start Date End Date Taking? Authorizing Provider  acetaminophen (TYLENOL) 500 MG tablet Take 1,000 mg by mouth every 6 (six) hours as needed.   Yes [provider]  albuterol (PROVENTIL HFA;VENTOLIN HFA) 108 (90 BASE) MCG/ACT inhaler Inhale 2 puffs into the lungs every 6 (six) hours as needed for wheezing or shortness of breath. 04/09/15  Yes Mody, Patricia Pesa, MD  albuterol (PROVENTIL) (2.5 MG/3ML) 0.083% nebulizer solution Take 2.5 mg by  nebulization every 6 (six) hours as needed for wheezing or shortness of breath.   Yes [provider]  ALPRAZolam (XANAX) 0.5 MG tablet Take 0.25-0.5 mg by mouth 2 (two) times daily as needed for anxiety. 12/17/20 12/17/21 Yes [provider]  docusate sodium (COLACE) 100 MG capsule Take 100 mg by mouth daily as needed for mild constipation.   Yes [provider]  ferrous sulfate 325 (65 FE) MG tablet Take 1 tablet (325 mg total) by mouth 2 (two) times daily with a meal. Patient taking differently: Take 650 mg by mouth daily with breakfast. 11/10/15  Yes Altamese Dilling, MD  Fluticasone-Umeclidin-Vilant (TRELEGY ELLIPTA IN) Inhale 1 puff into the lungs daily.   Yes [provider]  gabapentin (NEURONTIN) 300 MG capsule Take 300 mg by mouth 3 (three) times daily. 12/17/20  Yes [provider]  ipratropium-albuterol (DUONEB) 0.5-2.5 (3) MG/3ML SOLN Take 3 mLs by nebulization as directed. 02/05/21  Yes [provider]  magnesium oxide (MAG-OX) 400 MG tablet Take 400 mg by mouth daily.   Yes [provider]  pantoprazole (PROTONIX) 40 MG tablet Take 40 mg by mouth daily.   Yes [provider]  Potassium 99 MG TABS Take 2 tablets by mouth daily.   Yes [provider]  sertraline (ZOLOFT) 100 MG tablet Take 150 mg by mouth daily.  Yes [provider]  acetylcysteine (MUCOMYST) 20 % nebulizer solution Take 4 mLs by nebulization 2 (two) times daily. Patient not taking: Reported on 03/18/2021 03/24/16   Auburn Bilberry, MD  VENTOLIN HFA 108 [phone] Base) MCG/ACT inhaler INHALE 2 PUFFS INTO THE LUNGS EVERY 4 HOURS AS NEEDED FOR WHEEZING (REPLACES PROVENTIL) 11/07/19 11/06/20  Riccardo Dubin, MD    Allergies Albumen, egg and Banana  Family History  Problem Relation Age of Onset   Sarcoidosis Mother    Alcohol abuse Father     Social History Social History   Tobacco Use   Smoking status: Former     Packs/day: 0.50    Types: Cigarettes    Quit date: 04/25/2020    Years since quitting: 0.8   Smokeless tobacco: Never  Substance Use Topics   Alcohol use: No   Drug use: No    Review of Systems Constitutional: No fever.  + Chills. Eyes: No visual changes. ENT: No sore throat. Cardiovascular: +chest pain. Respiratory: + shortness of breath. Gastrointestinal: No nausea, vomiting, diarrhea. Genitourinary: Negative for dysuria. Musculoskeletal: Negative for back pain. Skin: Negative for rash. Neurological: Negative for focal weakness or numbness.  ____________________________________________   PHYSICAL EXAM:  VITAL SIGNS: ED Triage Vitals  Enc Vitals Group     BP 03/18/21 0410 (!) 158/92     Pulse Rate 03/18/21 0410 (!) 107     Resp 03/18/21 0410 (!) 22     Temp 03/18/21 0423 (!) 103.2 F (39.6 C)     Temp Source 03/18/21 0423 Oral     SpO2 03/18/21 0410 91 %     Weight 03/18/21 0413 135 lb (61.2 kg)     Height 03/18/21 0413 5\' 5"  (1.651 m)     Head Circumference --      Peak Flow --      Pain Score 03/18/21 0411 5     Pain Loc --      Pain Edu? --      Excl. in GC? --    CONSTITUTIONAL: Alert and oriented and responds appropriately to questions.  Chronically ill-appearing HEAD: Normocephalic EYES: Conjunctivae clear, pupils appear equal, EOM appear intact ENT: normal nose; moist mucous membranes NECK: Supple, normal ROM CARD: Regular and tachycardic; S1 and S2 appreciated; no murmurs, no clicks, no rubs, no gallops RESP: Normal chest excursion without splinting or tachypnea; breath sounds clear and equal bilaterally; no wheezes, no rhonchi, no rales, patient is hypoxic on room air at rest ABD/GI: Normal bowel sounds; non-distended; soft, non-tender, no rebound, no guarding, no peritoneal signs, no hepatosplenomegaly BACK: The back appears normal EXT: Normal ROM in all joints; no deformity noted, no edema; no cyanosis, no calf tenderness or calf swelling SKIN:  Normal color for age and race; warm; no rash on exposed skin NEURO: Moves all extremities equally PSYCH: The patient's mood and manner are appropriate.  ____________________________________________   LABS (all labs ordered are listed, but only abnormal results are displayed)  Labs Reviewed  CBC WITH DIFFERENTIAL/PLATELET - Abnormal; Notable for the following components:      Result Value   WBC 14.9 (*)    Hemoglobin 10.8 (*)    HCT 35.1 (*)    MCH 25.9 (*)    RDW 16.3 (*)    Neutro Abs 11.7 (*)    All other components within normal limits  LACTIC ACID, PLASMA - Abnormal; Notable for the following components:   Lactic Acid, Venous 2.5 (*)    All other components  within normal limits  COMPREHENSIVE METABOLIC PANEL - Abnormal; Notable for the following components:   Glucose, Bld 126 (*)    Total Bilirubin 0.2 (*)    All other components within normal limits  RESP PANEL BY RT-PCR (FLU A&B, COVID) ARPGX2  CULTURE, BLOOD (ROUTINE X 2)  CULTURE, BLOOD (ROUTINE X 2)  URINE CULTURE  ACID FAST SMEAR (AFB, MYCOBACTERIA)  ACID FAST CULTURE WITH REFLEXED SENSITIVITIES (MYCOBACTERIA)  PROTIME-INR  APTT  BRAIN NATRIURETIC PEPTIDE  LACTIC ACID, PLASMA  URINALYSIS, COMPLETE (UACMP) WITH MICROSCOPIC  HIV ANTIBODY (ROUTINE TESTING W REFLEX)  TROPONIN I (HIGH SENSITIVITY)  TROPONIN I (HIGH SENSITIVITY)   ____________________________________________  EKG   EKG Interpretation  Date/Time:  Wednesday March 18 2021 04:29:50 EST Ventricular Rate:  95 PR Interval:  144 QRS Duration: 74 QT Interval:  310 QTC Calculation: 389 R Axis:   110 Text Interpretation: Normal sinus rhythm Right axis deviation Abnormal ECG Confirmed by Rochele Raring [phone]) on 03/18/2021 4:55:12 AM        ____________________________________________  RADIOLOGY Normajean Baxter Sharisse Rantz, personally viewed and evaluated these images (plain radiographs) as part of my medical decision making, as well as reviewing the  written report by the radiologist.  ED MD interpretation: Perihilar and bilateral lower lung opacities worsening since CT in October.  Official radiology report(s): DG Chest Portable 1 View  Result Date: 03/18/2021 CLINICAL DATA:  60 year old female with shortness of breath.  COPD. EXAM: PORTABLE CHEST 1 VIEW COMPARISON:  Chest CT 02/10/2021 and earlier. FINDINGS: Portable AP upright view at 0457 hours. Pulmonary hyperinflation. Stable mediastinal contours with moderate gastric hiatal hernia, mild cardiomegaly. Visualized tracheal air column is within normal limits. No pneumothorax. No pulmonary edema, pleural effusion or consolidation identified. However, there is ongoing widespread left perihilar and bilateral lower lung reticulonodular opacity which appears progressed from the CT scout view last month. Chronic right lateral rib fractures. No acute osseous abnormality identified. Paucity of bowel gas in the upper abdomen. IMPRESSION: 1. Chronic lung disease with Progression of perihilar and bilateral lower lung reticulonodular opacity since the CT last month, with atypical/mycobacterial infectious process favored at that time. 2. Moderate hiatal hernia. Electronically Signed   By: Odessa Fleming M.D.   On: 03/18/2021 05:19    ____________________________________________   PROCEDURES  Procedure(s) performed (including Critical Care):  Procedures  CRITICAL CARE Performed by: Baxter Hire Jung Yurchak   Total critical care time: 45 minutes  Critical care time was exclusive of separately billable procedures and treating other patients.  Critical care was necessary to treat or prevent imminent or life-threatening deterioration.  Critical care was time spent personally by me on the following activities: development of treatment plan with patient and/or surrogate as well as nursing, discussions with consultants, evaluation of patient's response to treatment, examination of patient, obtaining history from patient  or surrogate, ordering and performing treatments and interventions, ordering and review of laboratory studies, ordering and review of radiographic studies, pulse oximetry and re-evaluation of patient's condition.  ____________________________________________   INITIAL IMPRESSION / ASSESSMENT AND PLAN / ED COURSE  As part of my medical decision making, I reviewed the following data within the electronic MEDICAL RECORD NUMBER Nursing notes reviewed and incorporated, Labs reviewed , EKG interpreted , Old EKG reviewed, Old chart reviewed, Radiograph reviewed , Discussed with admitting physician , and Notes from prior ED visits         Patient here with shortness of breath, wheezing, cough, chills.  Found to have oral temperature of 103.2.  Differential  includes pneumonia, COPD exacerbation, PE, pneumothorax, CHF, COVID, influenza, RSV or other viral upper respiratory infection.  We will begin septic work-up and give IV fluids and antibiotics given she is febrile, tachypneic, tachycardic.  She is currently on 4 L of oxygen and satting 98%.  Patient will need admission given her oxygen requirement that is new.  ED PROGRESS  Patient's labs show leukocytosis of 14,000.  Chest x-ray reviewed by myself and radiology and shows progression of perihilar and bilateral lower lung reticular nodular opacities since CT scan in October 2022 this concerning for atypical/mycobacterial infectious process.  AFB pending.  On contact and airborne precautions.  Patient still on 4.5 L but satting 97%.  Will wean down slowly.  First lactic is 2.5.  She has received 2 L of IV fluids.  Second lactic pending.  Troponin negative.  BNP normal.  COVID and flu negative.    Will discuss with medicine for admission.  7:09 AM Discussed patient's case with hospitalist, Dr. Clyde Lundborg.  I have recommended admission and patient (and family if present) agree with this plan. Admitting physician will place admission orders.   I reviewed all  nursing notes, vitals, pertinent previous records and reviewed/interpreted all EKGs, lab and urine results, imaging (as available).  ____________________________________________   FINAL CLINICAL IMPRESSION(S) / ED DIAGNOSES  Final diagnoses:  Pneumonia of both lower lobes due to infectious organism  COPD exacerbation (HCC)  Acute respiratory failure with hypoxia [facility])     ED Discharge Orders     None       *Please note:  Bekka S Polyak was evaluated in Emergency Department on 03/18/2021 for the symptoms described in the history of present illness. She was evaluated in the context of the global COVID-19 pandemic, which necessitated consideration that the patient might be at risk for infection with the SARS-CoV-2 virus that causes COVID-19. Institutional protocols and algorithms that pertain to the evaluation of patients at risk for COVID-19 are in a state of rapid change based on information released by regulatory bodies including the CDC and federal and state organizations. These policies and algorithms were followed during the patient's care in the ED.  Some ED evaluations and interventions may be delayed as a result of limited staffing during and the pandemic.*   Note:  This document was prepared using Dragon voice recognition software and may include unintentional dictation errors.    Tila Millirons, Layla Maw, DO 03/18/21 [phone]

## 2021-03-18 NOTE — Consult Note (Signed)
[name]  DOB: 04-24-1961  MRN: [id number]  Date/Time: 03/18/2021 3:18 PM  REQUESTING PROVIDER: Dr.NIU Subjective:  REASON FOR CONSULT: Atypical pulmonary infection ? Julia [name] is a 60 y.o. female with a history of COPD, ventral hernia, GERD, smoker, pulmonary nodules followed at [facility] presents with 1 day h/o of shortness of breath and some chills- she felt hot but did not check her temp Vitals in the ED today was 158/92, Pulse 107, Temp 103.2, RR 22 , sats 91% on 4 L  Wbc 14.9, HB 10.8, PLT 322, cr 0.78, procal < 0.10, lactate 2.5 Culture sent. CXR Chronic lung disease with Progression of perihilar and bilateral lower lung reticulonodular opacity since the CT last month  Recent hem0philus influenza infection-02/16/21 treated with augmentin  Has had sputum afb done many times in the past - 02/14/21- neg, 10/25/18 Afb neg  Past Medical History:  Diagnosis Date   Anemia    Anxiety    Asthma    COPD (chronic obstructive pulmonary disease) ([place])    Depression    Ventral hernia     Past Surgical History:  Procedure Laterality Date   COLON SURGERY     EAR TUBE REMOVAL     FRACTURE SURGERY     HERNIA REPAIR     TUBAL LIGATION      Social History   Socioeconomic History   Marital status: Married    Spouse name: Not on file   Number of children: Not on file   Years of education: Not on file   Highest education level: Not on file  Occupational History   Not on file  Tobacco Use   Smoking status: Former    Packs/day: 0.50    Types: Cigarettes    Quit date: 04/25/2020    Years since quitting: 0.8   Smokeless tobacco: Never  Substance and Sexual Activity   Alcohol use: No   Drug use: No   Sexual activity: Not on file  Other Topics Concern   Not on file  Social History Narrative   Not on file   Social Determinants of Health   Financial Resource Strain: Not on file  Food Insecurity: Not on file  Transportation Needs: Not on file  Physical Activity:  Not on file  Stress: Not on file  Social Connections: Not on file  Intimate Partner Violence: Not on file    Family History  Problem Relation Age of Onset   Sarcoidosis Mother    Alcohol abuse Father    Allergies  Allergen Reactions   Albumen, Egg Nausea And Vomiting   Banana Nausea And Vomiting   I? Current Facility-Administered Medications  Medication Dose Route Frequency Provider Last Rate Last Admin   acetaminophen (TYLENOL) tablet 650 mg  650 mg Oral Q6H PRN Ivor Costa, MD   650 mg at 03/18/21 1028   albuterol (PROVENTIL) (2.5 MG/3ML) 0.083% nebulizer solution 2.5 mg  2.5 mg Nebulization Q4H PRN Ivor Costa, MD       ALPRAZolam Duanne Moron) tablet 0.5 mg  0.5 mg Oral BID PRN Ivor Costa, MD       azithromycin (ZITHROMAX) 500 mg in sodium chloride 0.9 % 250 mL IVPB  500 mg Intravenous Q24H Ivor Costa, MD   Stopped at 03/18/21 1107   cefTRIAXone (ROCEPHIN) 1 g in sodium chloride 0.9 % 100 mL IVPB  1 g Intravenous Q24H Ivor Costa, MD   Stopped at 03/18/21 1359   dextromethorphan-guaiFENesin (MUCINEX DM) 30-600 MG per 12 hr tablet  1 tablet  1 tablet Oral BID PRN Ivor Costa, MD       docusate sodium (COLACE) capsule 100 mg  100 mg Oral Daily PRN Ivor Costa, MD       ferrous sulfate tablet 650 mg  650 mg Oral Q breakfast Ivor Costa, MD   650 mg at 03/18/21 1029   gabapentin (NEURONTIN) capsule 300 mg  300 mg Oral TID Ivor Costa, MD   300 mg at 03/18/21 1028   ipratropium-albuterol (DUONEB) 0.5-2.5 (3) MG/3ML nebulizer solution 3 mL  3 mL Nebulization Q4H Ivor Costa, MD   3 mL at 03/18/21 1107   lactated ringers infusion   Intravenous Continuous Ivor Costa, MD 75 mL/hr at 03/18/21 0800 Rate Change at 03/18/21 0800   magnesium oxide (MAG-OX) tablet 400 mg  400 mg Oral Daily Ivor Costa, MD   400 mg at 03/18/21 1028   methylPREDNISolone sodium succinate (SOLU-MEDROL) 125 mg/2 mL injection 80 mg  80 mg Intravenous Daily Ivor Costa, MD   80 mg at 03/18/21 1029   nicotine (NICODERM CQ - dosed in  mg/24 hours) patch 21 mg  21 mg Transdermal Daily Ivor Costa, MD       ondansetron [facility]) injection 4 mg  4 mg Intravenous Q8H PRN Ivor Costa, MD       pantoprazole (PROTONIX) EC tablet 40 mg  40 mg Oral Daily Ivor Costa, MD   40 mg at 03/18/21 1028   sertraline (ZOLOFT) tablet 150 mg  150 mg Oral Daily Ivor Costa, MD   150 mg at 03/18/21 1028   Current Outpatient Medications  Medication Sig Dispense Refill   acetaminophen (TYLENOL) 500 MG tablet Take 1,000 mg by mouth every 6 (six) hours as needed.     albuterol (PROVENTIL HFA;VENTOLIN HFA) 108 (90 BASE) MCG/ACT inhaler Inhale 2 puffs into the lungs every 6 (six) hours as needed for wheezing or shortness of breath. 1 Inhaler 2   albuterol (PROVENTIL) (2.5 MG/3ML) 0.083% nebulizer solution Take 2.5 mg by nebulization every 6 (six) hours as needed for wheezing or shortness of breath.     ALPRAZolam (XANAX) 0.5 MG tablet Take 0.25-0.5 mg by mouth 2 (two) times daily as needed for anxiety.     docusate sodium (COLACE) 100 MG capsule Take 100 mg by mouth daily as needed for mild constipation.     ferrous sulfate 325 (65 FE) MG tablet Take 1 tablet (325 mg total) by mouth 2 (two) times daily with a meal. (Patient taking differently: Take 650 mg by mouth daily with breakfast.) 60 tablet 1   Fluticasone-Umeclidin-Vilant (TRELEGY ELLIPTA IN) Inhale 1 puff into the lungs daily.     gabapentin (NEURONTIN) 300 MG capsule Take 300 mg by mouth 3 (three) times daily.     ipratropium-albuterol (DUONEB) 0.5-2.5 (3) MG/3ML SOLN Take 3 mLs by nebulization as directed.     magnesium oxide (MAG-OX) 400 MG tablet Take 400 mg by mouth daily.     pantoprazole (PROTONIX) 40 MG tablet Take 40 mg by mouth daily.     Potassium 99 MG TABS Take 2 tablets by mouth daily.     sertraline (ZOLOFT) 100 MG tablet Take 150 mg by mouth daily.     acetylcysteine (MUCOMYST) 20 % nebulizer solution Take 4 mLs by nebulization 2 (two) times daily. (Patient not taking: Reported on  03/18/2021) 30 mL 12   VENTOLIN HFA 108 (90 Base) MCG/ACT inhaler INHALE 2 PUFFS INTO THE LUNGS EVERY 4 HOURS AS NEEDED FOR WHEEZING (  REPLACES PROVENTIL) 54 g 1     Abtx:  Anti-infectives (From admission, onward)    Start     Dose/Rate Route Frequency Ordered Stop   03/18/21 1300  cefTRIAXone (ROCEPHIN) 1 g in sodium chloride 0.9 % 100 mL IVPB        1 g 200 mL/hr over 30 Minutes Intravenous Every 24 hours 03/18/21 0839     03/18/21 0915  azithromycin (ZITHROMAX) 500 mg in sodium chloride 0.9 % 250 mL IVPB        500 mg 250 mL/hr over 60 Minutes Intravenous Every 24 hours 03/18/21 0839     03/18/21 0430  ceFEPIme (MAXIPIME) 2 g in sodium chloride 0.9 % 100 mL IVPB        2 g 200 mL/hr over 30 Minutes Intravenous  Once 03/18/21 0426 03/18/21 [id number]   03/18/21 0430  vancomycin (VANCOCIN) IVPB 1000 mg/200 mL premix  Status:  Discontinued        1,000 mg 200 mL/hr over 60 Minutes Intravenous  Once 03/18/21 0426 03/18/21 0429   03/18/21 0430  vancomycin (VANCOREADY) IVPB 1500 mg/300 mL        1,500 mg 150 mL/hr over 120 Minutes Intravenous  Once 03/18/21 0429 03/18/21 0910       REVIEW OF SYSTEMS:  Const:  fever,  chills, negative weight loss Eyes: negative diplopia or visual changes, negative eye pain ENT: negative coryza, negative sore throat Resp:  cough better she says than before , dyspnea Cards: negative for chest pain, palpitations, lower extremity edema GU: negative for frequency, dysuria and hematuria GI: Negative for abdominal pain, diarrhea, bleeding, constipation Skin: negative for rash and pruritus Heme: negative for easy bruising and gum/nose bleeding MS: negative for myalgias, arthralgias, back pain and muscle weakness Neurolo: dizziness, vertigo, no memory problems  Psych:  anxiety, depression  Endocrine: negative for thyroid, diabetes Allergy/Immunology- food allergy  Objective:  VITALS:  BP 112/70 (BP Location: Right Arm)   Pulse 87   Temp 98 F (36.7 C)  (Oral)   Resp 17   Ht 5\' 5"  (1.651 m)   Wt 61.2 kg   SpO2 98%   BMI 22.47 kg/m  PHYSICAL EXAM:  General: Alert, cooperative, no distress, appears stated age.  Head: Normocephalic, without obvious abnormality, atraumatic. Eyes: Conjunctivae clear, anicteric sclerae. Pupils are equal ENT Nares normal. No drainage or sinus tenderness. edentulous Neck: Supple, symmetrical, no adenopathy, thyroid: non tender no carotid bruit and no JVD. Back: No CVA tenderness. Lungs: b/l air entry- rhonchi. Heart: Regular rate and rhythm, no murmur, rub or gallop. Abdomen: Soft, non-tender,not distended. Bowel sounds normal. No masses hernia Extremities: atraumatic, no cyanosis. No edema. No clubbing Skin: No rashes or lesions. Or bruising Lymph: Cervical, supraclavicular normal. Neurologic: Grossly non-focal Pertinent Labs Lab Results CBC    Component Value Date/Time   WBC 14.9 (H) 03/18/2021 0412   RBC 4.17 03/18/2021 0412   HGB 10.8 (L) 03/18/2021 0412   HGB 8.3 (L) 04/02/2014 0647   HCT 35.1 (L) 03/18/2021 0412   HCT 26.7 (L) 04/02/2014 0647   PLT 322 03/18/2021 0412   PLT 243 04/02/2014 0647   MCV 84.2 03/18/2021 0412   MCV 79 (L) 04/02/2014 0647   MCH 25.9 (L) 03/18/2021 0412   MCHC 30.8 03/18/2021 0412   RDW 16.3 (H) 03/18/2021 0412   RDW 21.4 (H) 04/02/2014 0647   LYMPHSABS 2.2 03/18/2021 0412   LYMPHSABS 0.9 (L) 04/02/2014 0647   MONOABS 0.7 03/18/2021 0412   MONOABS  0.4 04/02/2014 0647   EOSABS 0.2 03/18/2021 0412   EOSABS 0.0 04/02/2014 0647   BASOSABS 0.1 03/18/2021 0412   BASOSABS 0.0 04/02/2014 0647    CMP Latest Ref Rng & Units 03/18/2021 07/23/2020 03/22/2016  Glucose 70 - 99 mg/dL 126(H) 129(H) 224(H)  BUN 8 - 23 mg/dL 16 18 24(H)  Creatinine 0.44 - 1.00 mg/dL 0.78 0.84 0.71  Sodium 135 - 145 mmol/L 139 138 137  Potassium 3.5 - 5.1 mmol/L 4.3 4.1 4.5  Chloride 98 - 111 mmol/L 103 102 100(L)  CO2 22 - 32 mmol/L 23 26 29   Calcium 8.9 - 10.3 mg/dL 9.9 10.0 9.5   Total Protein 6.5 - 8.1 g/dL 7.5 - -  Total Bilirubin 0.3 - 1.2 mg/dL 0.2(L) - -  Alkaline Phos 38 - 126 U/L 56 - -  AST 15 - 41 U/L 22 - -  ALT 0 - 44 U/L 14 - -      Microbiology: Recent Results (from the past 240 hour(s))  Resp Panel by RT-PCR (Flu A&B, Covid) Nasopharyngeal Swab     Status: None   Collection Time: 03/18/21  4:12 AM   Specimen: Nasopharyngeal Swab; Nasopharyngeal(NP) swabs in vial transport medium  Result Value Ref Range Status   SARS Coronavirus 2 by RT PCR NEGATIVE NEGATIVE Final    Comment: (NOTE) SARS-CoV-2 target nucleic acids are NOT DETECTED.  The SARS-CoV-2 RNA is generally detectable in upper respiratory specimens during the acute phase of infection. The lowest concentration of SARS-CoV-2 viral copies this assay can detect is 138 copies/mL. A negative result does not preclude SARS-Cov-2 infection and should not be used as the sole basis for treatment or other patient management decisions. A negative result may occur with  improper specimen collection/handling, submission of specimen other than nasopharyngeal swab, presence of viral mutation(s) within the areas targeted by this assay, and inadequate number of viral copies(<138 copies/mL). A negative result must be combined with clinical observations, patient history, and epidemiological information. The expected result is Negative.  Fact Sheet for Patients:  EntrepreneurPulse.com.au  Fact Sheet for Healthcare Providers:  IncredibleEmployment.be  This test is no t yet approved or cleared by the [place] FDA and  has been authorized for detection and/or diagnosis of SARS-CoV-2 by FDA under an Emergency Use Authorization (EUA). This EUA will remain  in effect (meaning this test can be used) for the duration of the COVID-19 declaration under Section 564(b)(1) of the Act, 21 U.S.C.section 360bbb-3(b)(1), unless the authorization is terminated  or revoked  sooner.       Influenza A by PCR NEGATIVE NEGATIVE Final   Influenza B by PCR NEGATIVE NEGATIVE Final    Comment: (NOTE) The Xpert Xpress SARS-CoV-2/FLU/RSV plus assay is intended as an aid in the diagnosis of influenza from Nasopharyngeal swab specimens and should not be used as a sole basis for treatment. Nasal washings and aspirates are unacceptable for Xpert Xpress SARS-CoV-2/FLU/RSV testing.  Fact Sheet for Patients: EntrepreneurPulse.com.au  Fact Sheet for Healthcare Providers: IncredibleEmployment.be  This test is not yet approved or cleared by the [place] FDA and has been authorized for detection and/or diagnosis of SARS-CoV-2 by FDA under an Emergency Use Authorization (EUA). This EUA will remain in effect (meaning this test can be used) for the duration of the COVID-19 declaration under Section 564(b)(1) of the Act, 21 U.S.C. section 360bbb-3(b)(1), unless the authorization is terminated or revoked.  Performed at [facility], [address], [place], [place] [address]   Blood  Culture (routine x 2)     Status: None (Preliminary result)   Collection Time: 03/18/21  4:12 AM   Specimen: BLOOD  Result Value Ref Range Status   Specimen Description BLOOD RIGHT HAND  Final   Special Requests   Final    BOTTLES DRAWN AEROBIC AND ANAEROBIC Blood Culture results may not be optimal due to an inadequate volume of blood received in culture bottles   Culture   Final    NO GROWTH < 12 HOURS Performed at [facility], [address] [place], [place] [address]    Report Status PENDING  Incomplete  Blood Culture (routine x 2)     Status: None (Preliminary result)   Collection Time: 03/18/21  4:12 AM   Specimen: BLOOD  Result Value Ref Range Status   Specimen Description BLOOD LEFT AC  Final   Special Requests   Final    BOTTLES DRAWN AEROBIC AND ANAEROBIC Blood Culture results may not be optimal due to an inadequate  volume of blood received in culture bottles   Culture   Final    NO GROWTH < 12 HOURS Performed at [facility], [address] [place], [place] [address]    Report Status PENDING  Incomplete  Respiratory (~20 pathogens) panel by PCR     Status: None   Collection Time: 03/18/21  4:12 AM   Specimen: Nasopharyngeal Swab; Respiratory  Result Value Ref Range Status   Adenovirus NOT DETECTED NOT DETECTED Final   Coronavirus 229E NOT DETECTED NOT DETECTED Final    Comment: (NOTE) The Coronavirus on the Respiratory Panel, DOES NOT test for the novel  Coronavirus (2019 nCoV)    Coronavirus HKU1 NOT DETECTED NOT DETECTED Final   Coronavirus NL63 NOT DETECTED NOT DETECTED Final   Coronavirus OC43 NOT DETECTED NOT DETECTED Final   Metapneumovirus NOT DETECTED NOT DETECTED Final   Rhinovirus / Enterovirus NOT DETECTED NOT DETECTED Final   Influenza A NOT DETECTED NOT DETECTED Final   Influenza B NOT DETECTED NOT DETECTED Final   Parainfluenza Virus 1 NOT DETECTED NOT DETECTED Final   Parainfluenza Virus 2 NOT DETECTED NOT DETECTED Final   Parainfluenza Virus 3 NOT DETECTED NOT DETECTED Final   Parainfluenza Virus 4 NOT DETECTED NOT DETECTED Final   Respiratory Syncytial Virus NOT DETECTED NOT DETECTED Final   Bordetella pertussis NOT DETECTED NOT DETECTED Final   Bordetella Parapertussis NOT DETECTED NOT DETECTED Final   Chlamydophila pneumoniae NOT DETECTED NOT DETECTED Final   Mycoplasma pneumoniae NOT DETECTED NOT DETECTED Final    Comment: Performed at [facility], [address] [place], [place] [address]    IMAGING RESULTS:   I have personally reviewed the films ?Extensive peribronchovascular nodularity and nodular consolidation are likely due to an atypical/mycobacterial infectious process  Impression/Recommendation ?COPD presenting with increasing sob, fever Temp of 103 in the ED CXR shows worsening infiltrate in the lower lobes- she has had these  pulmonary nodules and tree in bud appearance and is followed at [facility] and recent sputum culture in oct 2022 was hemophilus and was given augmentin- sputum afb was neg- she has had intermittent AFB tests since 2020 and has been  negative ' atypical mycobacteria like MAI is a consideration. She is currently being treated as CAP with ceftriaxone and azithromycin She needs bronch some time ? Anemia  ?Smoker- continues to use smoke, vape ___________________________________________________ Discussed with patient, requesting provider Note:  This document was prepared using Conservation officer, historic buildings and  may include unintentional dictation errors.

## 2021-03-18 NOTE — Progress Notes (Signed)
CODE SEPSIS - PHARMACY COMMUNICATION  **Broad Spectrum Antibiotics should be administered within 1 hour of Sepsis diagnosis**  Time Code Sepsis Called/Page Received: 2111  Antibiotics Ordered: Cefepime and Vancomycin  Time of 1st antibiotic administration: 0457  Otelia Sergeant, PharmD, MBA 03/18/2021 4:30 AM

## 2021-03-18 NOTE — ED Triage Notes (Signed)
sob o2 was 84 on room air; got 2 duo nebs- wheezing and rhonchi- 20G in left forearm, 125mg  solumedrol- 124/69, pulse 94; rr 33

## 2021-03-18 NOTE — Sepsis Progress Note (Signed)
Monitoring for the code sepsis protocol. °

## 2021-03-18 NOTE — Progress Notes (Signed)
PHARMACY -  BRIEF ANTIBIOTIC NOTE   Pharmacy has received consult(s) for Cefepime and Vancomycin from an ED provider.  The patient's profile has been reviewed for ht/wt/allergies/indication/available labs.    One time order(s) placed for Cefepime 2 gm and Vancomycin 1500 mg per pt wt: 61.2 kg.  Further antibiotics/pharmacy consults should be ordered by admitting physician if indicated.                       Thank you, Otelia Sergeant, PharmD, [facility] 03/18/2021 4:31 AM

## 2021-03-19 DIAGNOSIS — J441 Chronic obstructive pulmonary disease with (acute) exacerbation: Secondary | ICD-10-CM | POA: Diagnosis not present

## 2021-03-19 DIAGNOSIS — J189 Pneumonia, unspecified organism: Secondary | ICD-10-CM | POA: Diagnosis not present

## 2021-03-19 LAB — CBC
HCT: 27.6 % — ABNORMAL LOW (ref 36.0–46.0)
Hemoglobin: 8.4 g/dL — ABNORMAL LOW (ref 12.0–15.0)
MCH: 25.9 pg — ABNORMAL LOW (ref 26.0–34.0)
MCHC: 30.4 g/dL (ref 30.0–36.0)
MCV: 85.2 fL (ref 80.0–100.0)
Platelets: 204 10*3/uL (ref 150–400)
RBC: 3.24 MIL/uL — ABNORMAL LOW (ref 3.87–5.11)
RDW: 16.5 % — ABNORMAL HIGH (ref 11.5–15.5)
WBC: 13.6 10*3/uL — ABNORMAL HIGH (ref 4.0–10.5)
nRBC: 0 % (ref 0.0–0.2)

## 2021-03-19 LAB — EXPECTORATED SPUTUM ASSESSMENT W GRAM STAIN, RFLX TO RESP C

## 2021-03-19 MED ORDER — TRAZODONE HCL 50 MG PO TABS
25.0000 mg | ORAL_TABLET | Freq: Every evening | ORAL | Status: DC | PRN
  Administered 2021-03-19: 21:00:00 25 mg via ORAL
  Filled 2021-03-19: qty 1

## 2021-03-19 MED ORDER — IPRATROPIUM-ALBUTEROL 0.5-2.5 (3) MG/3ML IN SOLN
3.0000 mL | Freq: Four times a day (QID) | RESPIRATORY_TRACT | Status: DC
  Administered 2021-03-20 (×3): 3 mL via RESPIRATORY_TRACT
  Filled 2021-03-19 (×3): qty 3

## 2021-03-19 MED ORDER — ENOXAPARIN SODIUM 40 MG/0.4ML IJ SOSY
40.0000 mg | PREFILLED_SYRINGE | INTRAMUSCULAR | Status: DC
  Administered 2021-03-19 – 2021-03-20 (×2): 40 mg via SUBCUTANEOUS
  Filled 2021-03-19 (×2): qty 0.4

## 2021-03-19 NOTE — ED Notes (Signed)
Informed RN bed assigned 

## 2021-03-19 NOTE — Progress Notes (Signed)
Date of Admission:  03/18/2021      ID: SO FREYRE is a 60 y.o. female  Principal Problem:   COPD exacerbation ([place]) Active Problems:   Acute respiratory failure with hypoxia (HCC)   Lung nodule   Atypical pneumonia   Severe sepsis (HCC)   Depression with anxiety   Iron deficiency anemia    Subjective: Complains of substernal pain on deep inspiration. Some cough No fever  Medications:   enoxaparin (LOVENOX) injection  40 mg Subcutaneous Q24H   ferrous sulfate  650 mg Oral Q breakfast   gabapentin  300 mg Oral TID   ipratropium-albuterol  3 mL Nebulization Q4H   magnesium oxide  400 mg Oral Daily   methylPREDNISolone (SOLU-MEDROL) injection  80 mg Intravenous Daily   nicotine  21 mg Transdermal Daily   pantoprazole  40 mg Oral Daily   sertraline  150 mg Oral Daily    Objective: Vital signs in last 24 hours: Temp:  [98 F (36.7 C)-98.6 F (37 C)] 98.6 F (37 C) (11/17 1948) Pulse Rate:  [57-79] 79 (11/17 1948) Resp:  [13-19] 18 (11/17 1948) BP: (104-146)/(61-115) 122/61 (11/17 1948) SpO2:  [93 %-100 %] 96 % (11/17 1948)  PHYSICAL EXAM:  General: Alert, cooperative, no distress,  Head: Normocephalic, without obvious abnormality, atraumatic. Eyes: Conjunctivae clear, anicteric sclerae. Pupils are equal ENT Nares normal. No drainage or sinus tenderness. Lips, mucosa, and tongue normal. No Thrush Edentulous Neck: Supple, symmetrical, no adenopathy, thyroid: non tender no carotid bruit and no JVD. Back: No CVA tenderness. Lungs: Bilateral air entry. Decreased in the bases Rhonchi few . Heart: Regular rate and rhythm, no murmur, rub or gallop. Abdomen: Soft, non-tender,not distended. Bowel sounds normal. No masses Extremities: atraumatic, no cyanosis. No edema. No clubbing Skin: No rashes or lesions. Or bruising Lymph: Cervical, supraclavicular normal. Neurologic: Grossly non-focal  Lab Results Recent Labs    03/18/21 0412 03/19/21 0629  WBC  14.9* 13.6*  HGB 10.8* 8.4*  HCT 35.1* 27.6*  NA 139  --   K 4.3  --   CL 103  --   CO2 23  --   BUN 16  --   CREATININE 0.78  --    Liver Panel Recent Labs    03/18/21 0412  PROT 7.5  ALBUMIN 3.8  AST 22  ALT 14  ALKPHOS 56  BILITOT 0.2*   Sedimentation Rate No results for input(s): ESRSEDRATE in the last 72 hours. C-Reactive Protein No results for input(s): CRP in the last 72 hours.  Microbiology:  Studies/Results: DG Chest Portable 1 View  Result Date: 03/18/2021 CLINICAL DATA:  60 year old female with shortness of breath.  COPD. EXAM: PORTABLE CHEST 1 VIEW COMPARISON:  Chest CT 02/10/2021 and earlier. FINDINGS: Portable AP upright view at 0457 hours. Pulmonary hyperinflation. Stable mediastinal contours with moderate gastric hiatal hernia, mild cardiomegaly. Visualized tracheal air column is within normal limits. No pneumothorax. No pulmonary edema, pleural effusion or consolidation identified. However, there is ongoing widespread left perihilar and bilateral lower lung reticulonodular opacity which appears progressed from the CT scout view last month. Chronic right lateral rib fractures. No acute osseous abnormality identified. Paucity of bowel gas in the upper abdomen. IMPRESSION: 1. Chronic lung disease with Progression of perihilar and bilateral lower lung reticulonodular opacity since the CT last month, with atypical/mycobacterial infectious process favored at that time. 2. Moderate hiatal hernia. Electronically Signed   By: Genevie Ann M.D.   On: 03/18/2021 05:19  Assessment/Plan: COPD presenting with increasing shortness of breath and fever.  Currently on azithromycin and ceftriaxone will utilize. No fever since hospitalization. Chest x-ray showed worsening infiltrate in the lower lobes she is.  She has had pulmonary nodules and tree-in-bud appearance in the past x-rays.   sputum for AFB was started in October 2022 and it was negative.  .  She had a recent haemophilus  influenza infection and was given Augmentin in October 22. She is currently being treated as CAP with ceftriaxone and azithromycin.  May be able to discontinue ceftriaxone and continue just azithromycin to complete 5 days course. She will need bronchoscopy if Left lower lobe infiltrate has to be pursued to rule out atypical mycobacterial infection..  This is chronic in nature. She could follow-up with her pulmonologist at [facility].  Anemia Smoker continues to smoke antibiotic. Discussed the management with the patient.

## 2021-03-19 NOTE — Progress Notes (Signed)
Patient arrived to room 149 on room air. States "I need oxygen." Placed on 3L via [place], VSS. Skin swarm completed, no skin issues identified. Oriented to room and call light system.

## 2021-03-19 NOTE — Progress Notes (Signed)
  Progress Note    Julia [name]   TDD:[id number]  DOB: 08/18/1960  DOA: 03/18/2021     1 Date of Service: 03/19/2021   Clinical Course Presents with complaints of cough and shortness of breath.  CT chest negative for PE but shows evidence of atypical infection.  Influenza and COVID test negative.  Assessment and Plan Acute respiratory failure with hypoxia  Sepsis due to atypical pneumonia. COPD exacerbation due to atypical pneumonia: On admission patient meets criteria for SIRS with WBC 14.9, fever of 103.2, tachycardia with heart rate 107, tachypnea with RR 24. Influenza and COVID is negative.  Procalcitonin also negative.  Less likely bacterial infection.  Possibly viral pneumonia cannot be ruled out. Also atypical infectious process cannot be ruled out especially MAI in the setting of intermittent AFB. Currently on ceftriaxone and azithromycin although I suspect we can stop it. Continue with steroids. Continue with nebulizer therapy. Treated with Mucinex and bronchodilators.   Lung nodule:  pt had CT scan on 02/10/21. -need to f/u with PCP closely. Pt is aware of this issue and will f/u with PCP   Depression with anxiety -Continue home medications   Iron deficiency anemia: Hemoglobin stable, 11.8 today -Continue home iron supplement   Subjective:  Continues to have shortness of breath and cough.  No nausea no vomiting no fever no chills.  Symptoms ongoing only for 4 days.  Objective Vitals:   03/19/21 0430 03/19/21 1242 03/19/21 1700 03/19/21 1948  BP: 118/78 104/67 125/78 122/61  Pulse: 71 75 75 79  Resp: 19 13 19 18   Temp:    98.6 F (37 C)  TempSrc:    Oral  SpO2: 100% 95% 93% 96%  Weight:      Height:       61.2 kg  Exam  General: Appear in mild distress, no Rash; Oral Mucosa Clear, moist. no Abnormal Neck Mass Or lumps, Conjunctiva normal  Cardiovascular: S1 and S2 Present, no Murmur, Respiratory: good respiratory effort, Bilateral Air entry present  and CTA, bilateral  Crackles, bilateral wheezes Abdomen: Bowel Sound present, Soft and no tenderness Extremities: no Pedal edema Neurology: alert and oriented to time, place, and person affect appropriate. no new focal deficit Gait not checked due to patient safety concerns     Labs / Other Information My review of labs, imaging, notes and other tests is significant for     Negative procalcitonin.  Disposition Plan: Status is: Inpatient  Remains inpatient appropriate because: Ongoing respiratory distress with bilateral expiratory wheezing.  Also further work-up.  We will remove airborne isolation given negative AFB test in October 2022.  Time spent: 35 minutes [facility] 03/19/2021, 8:32 PM

## 2021-03-20 LAB — PROCALCITONIN: Procalcitonin: 0.43 ng/mL

## 2021-03-20 MED ORDER — DM-GUAIFENESIN ER 30-600 MG PO TB12: 1.0000 | ORAL_TABLET | Freq: Two times a day (BID) | ORAL | 0 refills | Status: DC

## 2021-03-20 MED ORDER — PREDNISONE 10 MG PO TABS: ORAL_TABLET | ORAL | 0 refills | Status: DC

## 2021-03-20 MED ORDER — NICOTINE 21 MG/24HR TD PT24: 21.0000 mg | MEDICATED_PATCH | Freq: Every day | TRANSDERMAL | 0 refills | Status: DC

## 2021-03-20 MED ORDER — AZITHROMYCIN 500 MG PO TABS: 500.0000 mg | ORAL_TABLET | Freq: Every day | ORAL | 0 refills | Status: AC

## 2021-03-20 NOTE — Progress Notes (Signed)
DISCHARGE NOTE:  Pt given discharge instructions. Pt verbalized understanding. Pt waiting on husband.

## 2021-03-20 NOTE — Progress Notes (Signed)
Met with the patient to discuss DC plan and needs She Lives at home with her husband and 3 minor sons, she has transportation and can afford her medication, she has Oxygen at home that she uses at night at 2.5 liters she will be doing an ambulating Pulse Ox to see if she needs during the day as well, will need a portable tank, She has this thru [employer], She walks independently without DME, No home health needs Will notify [employer] once Ambulating O2 sat done

## 2021-03-20 NOTE — Progress Notes (Signed)
SATURATION QUALIFICATIONS: (This note is used to comply with regulatory documentation for home oxygen)  Patient Saturations on Room Air at Rest = 93%  Patient Saturations on Room Air while Ambulating = 91%  Patient Saturations on 0 Liters of oxygen while Ambulating = 91%  Please briefly explain why patient needs home oxygen: 

## 2021-03-23 LAB — CULTURE, BLOOD (ROUTINE X 2)
Culture: NO GROWTH
Culture: NO GROWTH

## 2021-03-23 NOTE — Discharge Summary (Signed)
Physician Discharge Summary   Patient name: Julia [name]  Admit date:     03/18/2021  Discharge date: 03/20/2021   Discharge Physician: Lynden Oxford   PCP: Olena Leatherwood, FNP   Recommendations at discharge: Follow-up with PCP in 1 week.  Discharge Diagnoses Principal Problem:   COPD exacerbation (HCC) Active Problems:   Acute respiratory failure with hypoxia (HCC)   Lung nodule   Atypical pneumonia   Severe sepsis (HCC)   Depression with anxiety   Iron deficiency anemia  Hospital Course   Presented with complaints of cough and shortness of breath.  CT chest was negative for pulmonary embolism.  Showed evidence of atypical infection. Influenza and COVID test was negative.  ID was consulted.  Oxygenation improved.  Discharged home on oral antibiotic.  Patient will require follow-up with pulmonary for bronchoscopy.  Acute respiratory failure with hypoxia  Sepsis due to atypical pneumonia. COPD exacerbation due to atypical pneumonia: On admission patient meets criteria for SIRS with WBC 14.9, fever of 103.2, tachycardia with heart rate 107, tachypnea with RR 24. Influenza and COVID is negative.  Procalcitonin also negative.  Less likely bacterial infection.  Possibly viral pneumonia cannot be ruled out. Also atypical infectious process cannot be ruled out especially MAI in the setting of intermittent AFB. Was on ceftriaxone and azithromycin appreciate ID assistance. Patient will be on oral azithromycin. Continue with steroids. Continue with nebulizer therapy. Treated with Mucinex and bronchodilators. Patient will require close follow-up with pulmonary outpatient.  Lung nodule:  pt had CT scan on 02/10/21. need to f/u with PCP closely. Pt is aware of this issue and will f/u with PCP   Depression with anxiety -Continue home medications   Iron deficiency anemia: Hemoglobin stable, 11.8 today -Continue home iron supplement  Procedures performed: none   Condition at  discharge: good  Exam General: Appear in mild distress, no Rash; Oral Mucosa Clear, moist. no Abnormal Neck Mass Or lumps, Conjunctiva normal  Cardiovascular: S1 and S2 Present, no Murmur, Respiratory: increased respiratory effort, Bilateral Air entry present and bilateral  Crackles, Occasional  wheezes Abdomen: Bowel Sound present, Soft and no tenderness Extremities: trace Pedal edema Neurology: alert and oriented to time, place, and person affect appropriate. no new focal deficit Gait not checked due to patient safety concerns    Disposition: Home  Discharge time: greater than 30 minutes.  Follow-up Information     Olena Leatherwood, FNP. Schedule an appointment as soon as possible for a visit in 1 week(s).   Specialty: Nurse Practitioner Contact information: [address] [place] [address] [phone]                 Allergies as of 03/20/2021       Reactions   Banana Nausea And Vomiting        Medication List     TAKE these medications    acetaminophen 500 MG tablet Commonly known as: TYLENOL Take 1,000 mg by mouth every 6 (six) hours as needed.   acetylcysteine 20 % nebulizer solution Commonly known as: MUCOMYST Take 4 mLs by nebulization 2 (two) times daily.   albuterol (2.5 MG/3ML) 0.083% nebulizer solution Commonly known as: PROVENTIL Take 2.5 mg by nebulization every 6 (six) hours as needed for wheezing or shortness of breath.   albuterol 108 (90 Base) MCG/ACT inhaler Commonly known as: VENTOLIN HFA Inhale 2 puffs into the lungs every 6 (six) hours as needed for wheezing or shortness of breath.   Ventolin  HFA 108 (90 Base) MCG/ACT inhaler Generic drug: albuterol INHALE 2 PUFFS INTO THE LUNGS EVERY 4 HOURS AS NEEDED FOR WHEEZING (REPLACES PROVENTIL)   ALPRAZolam 0.5 MG tablet Commonly known as: XANAX Take 0.25-0.5 mg by mouth 2 (two) times daily as needed for anxiety.   azithromycin 500 MG tablet Commonly known as: Zithromax Take 1  tablet (500 mg total) by mouth daily for 5 days. Take 1 tablet daily for 3 days.   dextromethorphan-guaiFENesin 30-600 MG 12hr tablet Commonly known as: MUCINEX DM Take 1 tablet by mouth 2 (two) times daily.   docusate sodium 100 MG capsule Commonly known as: COLACE Take 100 mg by mouth daily as needed for mild constipation.   ferrous sulfate 325 (65 FE) MG tablet Take 1 tablet (325 mg total) by mouth 2 (two) times daily with a meal. What changed:  how much to take when to take this   gabapentin 300 MG capsule Commonly known as: NEURONTIN Take 300 mg by mouth 3 (three) times daily.   ipratropium-albuterol 0.5-2.5 (3) MG/3ML Soln Commonly known as: DUONEB Take 3 mLs by nebulization as directed.   magnesium oxide 400 MG tablet Commonly known as: MAG-OX Take 400 mg by mouth daily.   nicotine 21 mg/24hr patch Commonly known as: NICODERM CQ - dosed in mg/24 hours Place 1 patch (21 mg total) onto the skin daily.   pantoprazole 40 MG tablet Commonly known as: PROTONIX Take 40 mg by mouth daily.   Potassium 99 MG Tabs Take 2 tablets by mouth daily.   predniSONE 10 MG tablet Commonly known as: DELTASONE Take 40mg  daily for 3days,Take 30mg  daily for 3days,Take 20mg  daily for 3days,Take 10mg  daily for 3days, then stop   sertraline 100 MG tablet Commonly known as: ZOLOFT Take 150 mg by mouth daily.   TRELEGY ELLIPTA IN Inhale 1 puff into the lungs daily.        DG Chest Portable 1 View  Result Date: 03/18/2021 CLINICAL DATA:  60 year old female with shortness of breath.  COPD. EXAM: PORTABLE CHEST 1 VIEW COMPARISON:  Chest CT 02/10/2021 and earlier. FINDINGS: Portable AP upright view at 0457 hours. Pulmonary hyperinflation. Stable mediastinal contours with moderate gastric hiatal hernia, mild cardiomegaly. Visualized tracheal air column is within normal limits. No pneumothorax. No pulmonary edema, pleural effusion or consolidation identified. However, there is ongoing  widespread left perihilar and bilateral lower lung reticulonodular opacity which appears progressed from the CT scout view last month. Chronic right lateral rib fractures. No acute osseous abnormality identified. Paucity of bowel gas in the upper abdomen. IMPRESSION: 1. Chronic lung disease with Progression of perihilar and bilateral lower lung reticulonodular opacity since the CT last month, with atypical/mycobacterial infectious process favored at that time. 2. Moderate hiatal hernia. Electronically Signed   By: M.D.   On: 03/18/2021 05:19   Results for orders placed or performed during the hospital encounter of 03/18/21  Resp Panel by RT-PCR (Flu A&B, Covid) Nasopharyngeal Swab     Status: None   Collection Time: 03/18/21  4:12 AM   Specimen: Nasopharyngeal Swab; Nasopharyngeal(NP) swabs in vial transport medium  Result Value Ref Range Status   SARS Coronavirus 2 by RT PCR NEGATIVE NEGATIVE Final    Comment: (NOTE) SARS-CoV-2 target nucleic acids are NOT DETECTED.  The SARS-CoV-2 RNA is generally detectable in upper respiratory specimens during the acute phase of infection. The lowest concentration of SARS-CoV-2 viral copies this assay can detect is 138 copies/mL. A negative result does not preclude  SARS-Cov-2 infection and should not be used as the sole basis for treatment or other patient management decisions. A negative result may occur with  improper specimen collection/handling, submission of specimen other than nasopharyngeal swab, presence of viral mutation(s) within the areas targeted by this assay, and inadequate number of viral copies(<138 copies/mL). A negative result must be combined with clinical observations, patient history, and epidemiological information. The expected result is Negative.  Fact Sheet for Patients:  BloggerCourse.com  Fact Sheet for Healthcare Providers:  SeriousBroker.it  This test is no t yet  approved or cleared by the [place] FDA and  has been authorized for detection and/or diagnosis of SARS-CoV-2 by FDA under an Emergency Use Authorization (EUA). This EUA will remain  in effect (meaning this test can be used) for the duration of the COVID-19 declaration under Section 564(b)(1) of the Act, 21 U.S.C.section 360bbb-3(b)(1), unless the authorization is terminated  or revoked sooner.       Influenza A by PCR NEGATIVE NEGATIVE Final   Influenza B by PCR NEGATIVE NEGATIVE Final    Comment: (NOTE) The Xpert Xpress SARS-CoV-2/FLU/RSV plus assay is intended as an aid in the diagnosis of influenza from Nasopharyngeal swab specimens and should not be used as a sole basis for treatment. Nasal washings and aspirates are unacceptable for Xpert Xpress SARS-CoV-2/FLU/RSV testing.  Fact Sheet for Patients: BloggerCourse.com  Fact Sheet for Healthcare Providers: SeriousBroker.it  This test is not yet approved or cleared by the [place] FDA and has been authorized for detection and/or diagnosis of SARS-CoV-2 by FDA under an Emergency Use Authorization (EUA). This EUA will remain in effect (meaning this test can be used) for the duration of the COVID-19 declaration under Section 564(b)(1) of the Act, 21 U.S.C. section 360bbb-3(b)(1), unless the authorization is terminated or revoked.  Performed at [facility], [address] [place], [place] [address]   Blood Culture (routine x 2)     Status: None   Collection Time: 03/18/21  4:12 AM   Specimen: BLOOD  Result Value Ref Range Status   Specimen Description BLOOD RIGHT HAND  Final   Special Requests   Final    BOTTLES DRAWN AEROBIC AND ANAEROBIC Blood Culture results may not be optimal due to an inadequate volume of blood received in culture bottles   Culture   Final    NO GROWTH 5 DAYS Performed at [facility], [address] [place], [place]  [address]    Report Status 03/23/2021 FINAL  Final  Blood Culture (routine x 2)     Status: None   Collection Time: 03/18/21  4:12 AM   Specimen: BLOOD  Result Value Ref Range Status   Specimen Description BLOOD LEFT AC  Final   Special Requests   Final    BOTTLES DRAWN AEROBIC AND ANAEROBIC Blood Culture results may not be optimal due to an inadequate volume of blood received in culture bottles   Culture   Final    NO GROWTH 5 DAYS Performed at [facility], [address] [place], [place] [address]    Report Status 03/23/2021 FINAL  Final  Respiratory (~20 pathogens) panel by PCR     Status: None   Collection Time: 03/18/21  4:12 AM   Specimen: Nasopharyngeal Swab; Respiratory  Result Value Ref Range Status   Adenovirus NOT DETECTED NOT DETECTED Final   Coronavirus 229E NOT DETECTED NOT DETECTED Final    Comment: (NOTE) The Coronavirus on the Respiratory Panel, DOES NOT  test for the novel  Coronavirus (2019 nCoV)    Coronavirus HKU1 NOT DETECTED NOT DETECTED Final   Coronavirus NL63 NOT DETECTED NOT DETECTED Final   Coronavirus OC43 NOT DETECTED NOT DETECTED Final   Metapneumovirus NOT DETECTED NOT DETECTED Final   Rhinovirus / Enterovirus NOT DETECTED NOT DETECTED Final   Influenza A NOT DETECTED NOT DETECTED Final   Influenza B NOT DETECTED NOT DETECTED Final   Parainfluenza Virus 1 NOT DETECTED NOT DETECTED Final   Parainfluenza Virus 2 NOT DETECTED NOT DETECTED Final   Parainfluenza Virus 3 NOT DETECTED NOT DETECTED Final   Parainfluenza Virus 4 NOT DETECTED NOT DETECTED Final   Respiratory Syncytial Virus NOT DETECTED NOT DETECTED Final   Bordetella pertussis NOT DETECTED NOT DETECTED Final   Bordetella Parapertussis NOT DETECTED NOT DETECTED Final   Chlamydophila pneumoniae NOT DETECTED NOT DETECTED Final   Mycoplasma pneumoniae NOT DETECTED NOT DETECTED Final    Comment: Performed at [facility], [address] [place], [place] [address]  Expectorated  Sputum Assessment w Gram Stain, Rflx to Resp Cult     Status: None   Collection Time: 03/19/21 12:47 PM   Specimen: Expectorated Sputum  Result Value Ref Range Status   Specimen Description EXPECTORATED SPUTUM  Final   Special Requests NONE  Final   Sputum evaluation   Final    Sputum specimen not acceptable for testing.  Please recollect.   Performed at [facility], [address] [place], [place] [address]    Report Status 03/19/2021 FINAL  Final    Signed:  Lynden Oxford MD.  [facility] 03/23/2021, 4:55 PM

## 2021-03-25 LAB — FUNGITELL, SERUM: Fungitell Result: 86 pg/mL — ABNORMAL HIGH (ref <80)

## 2021-06-18 ENCOUNTER — Inpatient Hospital Stay
Admission: EM | Admit: 2021-06-18 | Discharge: 2021-06-20 | DRG: 190 | Disposition: A | Discharge status: Home / Self Care | Payer: 59 | Attending: Internal Medicine | Admitting: Internal Medicine

## 2021-06-18 ENCOUNTER — Emergency Department: Payer: 59

## 2021-06-18 ENCOUNTER — Inpatient Hospital Stay: Payer: 59

## 2021-06-18 ENCOUNTER — Encounter: Payer: Self-pay | Admitting: Emergency Medicine

## 2021-06-18 ENCOUNTER — Other Ambulatory Visit: Payer: Self-pay

## 2021-06-18 DIAGNOSIS — R778 Other specified abnormalities of plasma proteins: Secondary | ICD-10-CM | POA: Diagnosis not present

## 2021-06-18 DIAGNOSIS — F418 Other specified anxiety disorders: Secondary | ICD-10-CM | POA: Diagnosis not present

## 2021-06-18 DIAGNOSIS — Z91018 Allergy to other foods: Secondary | ICD-10-CM

## 2021-06-18 DIAGNOSIS — J441 Chronic obstructive pulmonary disease with (acute) exacerbation: Secondary | ICD-10-CM

## 2021-06-18 DIAGNOSIS — Z20822 Contact with and (suspected) exposure to covid-19: Secondary | ICD-10-CM | POA: Diagnosis present

## 2021-06-18 DIAGNOSIS — F17210 Nicotine dependence, cigarettes, uncomplicated: Secondary | ICD-10-CM | POA: Diagnosis present

## 2021-06-18 DIAGNOSIS — Z9981 Dependence on supplemental oxygen: Secondary | ICD-10-CM | POA: Diagnosis not present

## 2021-06-18 DIAGNOSIS — J9621 Acute and chronic respiratory failure with hypoxia: Secondary | ICD-10-CM | POA: Diagnosis present

## 2021-06-18 DIAGNOSIS — Z79899 Other long term (current) drug therapy: Secondary | ICD-10-CM | POA: Diagnosis not present

## 2021-06-18 DIAGNOSIS — K449 Diaphragmatic hernia without obstruction or gangrene: Secondary | ICD-10-CM | POA: Diagnosis present

## 2021-06-18 DIAGNOSIS — J9601 Acute respiratory failure with hypoxia: Secondary | ICD-10-CM

## 2021-06-18 DIAGNOSIS — R0603 Acute respiratory distress: Secondary | ICD-10-CM

## 2021-06-18 DIAGNOSIS — Z9851 Tubal ligation status: Secondary | ICD-10-CM | POA: Diagnosis not present

## 2021-06-18 DIAGNOSIS — F419 Anxiety disorder, unspecified: Secondary | ICD-10-CM | POA: Diagnosis present

## 2021-06-18 DIAGNOSIS — I2510 Atherosclerotic heart disease of native coronary artery without angina pectoris: Secondary | ICD-10-CM | POA: Diagnosis present

## 2021-06-18 DIAGNOSIS — R7989 Other specified abnormal findings of blood chemistry: Secondary | ICD-10-CM | POA: Diagnosis present

## 2021-06-18 DIAGNOSIS — R911 Solitary pulmonary nodule: Secondary | ICD-10-CM | POA: Diagnosis present

## 2021-06-18 DIAGNOSIS — F17200 Nicotine dependence, unspecified, uncomplicated: Secondary | ICD-10-CM | POA: Diagnosis present

## 2021-06-18 DIAGNOSIS — F32A Depression, unspecified: Secondary | ICD-10-CM | POA: Diagnosis present

## 2021-06-18 DIAGNOSIS — J984 Other disorders of lung: Secondary | ICD-10-CM

## 2021-06-18 HISTORY — DX: Other specified abnormal findings of blood chemistry: R79.89

## 2021-06-18 HISTORY — DX: Chronic obstructive pulmonary disease with (acute) exacerbation: J44.1

## 2021-06-18 LAB — CBC WITH DIFFERENTIAL/PLATELET
Abs Immature Granulocytes: 0.04 10*3/uL (ref 0.00–0.07)
Basophils Absolute: 0 10*3/uL (ref 0.0–0.1)
Basophils Relative: 0 %
Eosinophils Absolute: 0 10*3/uL (ref 0.0–0.5)
Eosinophils Relative: 0 %
HCT: 34.6 % — ABNORMAL LOW (ref 36.0–46.0)
Hemoglobin: 10.5 g/dL — ABNORMAL LOW (ref 12.0–15.0)
Immature Granulocytes: 0 %
Lymphocytes Relative: 7 %
Lymphs Abs: 0.8 10*3/uL (ref 0.7–4.0)
MCH: 25.7 pg — ABNORMAL LOW (ref 26.0–34.0)
MCHC: 30.3 g/dL (ref 30.0–36.0)
MCV: 84.8 fL (ref 80.0–100.0)
Monocytes Absolute: 0.8 10*3/uL (ref 0.1–1.0)
Monocytes Relative: 7 %
Neutro Abs: 10.2 10*3/uL — ABNORMAL HIGH (ref 1.7–7.7)
Neutrophils Relative %: 86 %
Platelets: 175 10*3/uL (ref 150–400)
RBC: 4.08 MIL/uL (ref 3.87–5.11)
RDW: 17.2 % — ABNORMAL HIGH (ref 11.5–15.5)
WBC: 11.9 10*3/uL — ABNORMAL HIGH (ref 4.0–10.5)
nRBC: 0.2 % (ref 0.0–0.2)

## 2021-06-18 LAB — COMPREHENSIVE METABOLIC PANEL
ALT: 18 U/L (ref 0–44)
AST: 24 U/L (ref 15–41)
Albumin: 3.8 g/dL (ref 3.5–5.0)
Alkaline Phosphatase: 57 U/L (ref 38–126)
Anion gap: 8 (ref 5–15)
BUN: 17 mg/dL (ref 8–23)
CO2: 28 mmol/L (ref 22–32)
Calcium: 10 mg/dL (ref 8.9–10.3)
Chloride: 102 mmol/L (ref 98–111)
Creatinine, Ser: 0.66 mg/dL (ref 0.44–1.00)
GFR, Estimated: >60 mL/min (ref >60)
Glucose, Bld: 116 mg/dL — ABNORMAL HIGH (ref 70–99)
Potassium: 4.7 mmol/L (ref 3.5–5.1)
Sodium: 138 mmol/L (ref 135–145)
Total Bilirubin: 0.4 mg/dL (ref 0.3–1.2)
Total Protein: 7.2 g/dL (ref 6.5–8.1)

## 2021-06-18 LAB — BLOOD GAS, VENOUS
Acid-Base Excess: 1.3 mmol/L (ref 0.0–2.0)
Bicarbonate: 27.2 mmol/L (ref 20.0–28.0)
Delivery systems: POSITIVE
FIO2: 40 %
O2 Saturation: 94.2 %
Patient temperature: 37
pCO2, Ven: 47 mmHg (ref 44–60)
pH, Ven: 7.37 (ref 7.25–7.43)
pO2, Ven: 65 mmHg — ABNORMAL HIGH (ref 32–45)

## 2021-06-18 LAB — TROPONIN I (HIGH SENSITIVITY)
Troponin I (High Sensitivity): 37 ng/L — ABNORMAL HIGH (ref <18)
Troponin I (High Sensitivity): 275 ng/L (ref <18)
Troponin I (High Sensitivity): 324 ng/L (ref <18)
Troponin I (High Sensitivity): 389 ng/L (ref <18)

## 2021-06-18 LAB — BRAIN NATRIURETIC PEPTIDE: B Natriuretic Peptide: 192.4 pg/mL — ABNORMAL HIGH (ref 0.0–100.0)

## 2021-06-18 LAB — RESP PANEL BY RT-PCR (FLU A&B, COVID) ARPGX2
Influenza A by PCR: NEGATIVE
Influenza B by PCR: NEGATIVE
SARS Coronavirus 2 by RT PCR: NEGATIVE

## 2021-06-18 LAB — MAGNESIUM: Magnesium: 1.6 mg/dL — ABNORMAL LOW (ref 1.7–2.4)

## 2021-06-18 LAB — LACTIC ACID, PLASMA: Lactic Acid, Venous: 1.5 mmol/L (ref 0.5–1.9)

## 2021-06-18 MED ORDER — MAGNESIUM OXIDE 400 MG PO TABS
400.0000 mg | ORAL_TABLET | Freq: Every day | ORAL | Status: DC
  Administered 2021-06-18 – 2021-06-20 (×3): 400 mg via ORAL
  Filled 2021-06-18 (×6): qty 1

## 2021-06-18 MED ORDER — ACETAMINOPHEN 650 MG RE SUPP: 650.0000 mg | Freq: Four times a day (QID) | RECTAL | Status: DC | PRN

## 2021-06-18 MED ORDER — PREDNISONE 20 MG PO TABS
40.0000 mg | ORAL_TABLET | Freq: Every day | ORAL | Status: DC
  Administered 2021-06-19 – 2021-06-20 (×2): 40 mg via ORAL
  Filled 2021-06-18 (×2): qty 2

## 2021-06-18 MED ORDER — ALBUTEROL SULFATE (2.5 MG/3ML) 0.083% IN NEBU: 2.5000 mg | INHALATION_SOLUTION | RESPIRATORY_TRACT | Status: DC | PRN

## 2021-06-18 MED ORDER — ACETAMINOPHEN 500 MG PO TABS: 1000.0000 mg | ORAL_TABLET | Freq: Four times a day (QID) | ORAL | Status: DC | PRN

## 2021-06-18 MED ORDER — SODIUM CHLORIDE 0.9 % IV SOLN
500.0000 mg | INTRAVENOUS | Status: DC
  Administered 2021-06-19: 500 mg via INTRAVENOUS
  Filled 2021-06-18: qty 500
  Filled 2021-06-18: qty 5

## 2021-06-18 MED ORDER — MAGNESIUM SULFATE 2 GM/50ML IV SOLN
2.0000 g | Freq: Once | INTRAVENOUS | Status: AC
  Administered 2021-06-18: 2 g via INTRAVENOUS
  Filled 2021-06-18: qty 50

## 2021-06-18 MED ORDER — FLUTICASONE-UMECLIDIN-VILANT 100-62.5-25 MCG/ACT IN AEPB
1.0000 | INHALATION_SPRAY | Freq: Every day | RESPIRATORY_TRACT | Status: DC
Start: 2021-06-18 — End: 2021-06-18

## 2021-06-18 MED ORDER — FLUTICASONE FUROATE-VILANTEROL 100-25 MCG/ACT IN AEPB
1.0000 | INHALATION_SPRAY | Freq: Every day | RESPIRATORY_TRACT | Status: DC
  Administered 2021-06-19 – 2021-06-20 (×2): 1 via RESPIRATORY_TRACT
  Filled 2021-06-18 (×2): qty 28

## 2021-06-18 MED ORDER — SERTRALINE HCL 50 MG PO TABS
150.0000 mg | ORAL_TABLET | Freq: Every day | ORAL | Status: DC
  Administered 2021-06-18 – 2021-06-20 (×3): 150 mg via ORAL
  Filled 2021-06-18 (×3): qty 3

## 2021-06-18 MED ORDER — SODIUM CHLORIDE 0.9 % IV SOLN: INTRAVENOUS | Status: AC

## 2021-06-18 MED ORDER — DOCUSATE SODIUM 100 MG PO CAPS: 100.0000 mg | ORAL_CAPSULE | Freq: Every day | ORAL | Status: DC | PRN

## 2021-06-18 MED ORDER — IPRATROPIUM-ALBUTEROL 0.5-2.5 (3) MG/3ML IN SOLN
3.0000 mL | Freq: Once | RESPIRATORY_TRACT | Status: AC
  Administered 2021-06-18: 3 mL via RESPIRATORY_TRACT

## 2021-06-18 MED ORDER — ONDANSETRON HCL 4 MG PO TABS: 4.0000 mg | ORAL_TABLET | Freq: Four times a day (QID) | ORAL | Status: DC | PRN

## 2021-06-18 MED ORDER — ENOXAPARIN SODIUM 40 MG/0.4ML IJ SOSY
40.0000 mg | PREFILLED_SYRINGE | INTRAMUSCULAR | Status: DC
  Administered 2021-06-18 – 2021-06-19 (×2): 40 mg via SUBCUTANEOUS
  Filled 2021-06-18 (×2): qty 0.4

## 2021-06-18 MED ORDER — SODIUM CHLORIDE 0.9 % IV SOLN
500.0000 mg | Freq: Once | INTRAVENOUS | Status: AC
  Administered 2021-06-18: 500 mg via INTRAVENOUS
  Filled 2021-06-18: qty 5

## 2021-06-18 MED ORDER — PANTOPRAZOLE SODIUM 40 MG PO TBEC
40.0000 mg | DELAYED_RELEASE_TABLET | Freq: Every day | ORAL | Status: DC
  Administered 2021-06-18 – 2021-06-20 (×3): 40 mg via ORAL
  Filled 2021-06-18 (×3): qty 1

## 2021-06-18 MED ORDER — ONDANSETRON HCL 4 MG/2ML IJ SOLN: 4.0000 mg | Freq: Four times a day (QID) | INTRAMUSCULAR | Status: DC | PRN

## 2021-06-18 MED ORDER — POTASSIUM 99 MG PO TABS: 2.0000 | ORAL_TABLET | Freq: Every day | ORAL | Status: DC

## 2021-06-18 MED ORDER — FERROUS SULFATE 325 (65 FE) MG PO TABS
325.0000 mg | ORAL_TABLET | Freq: Two times a day (BID) | ORAL | Status: DC
  Administered 2021-06-18 – 2021-06-20 (×5): 325 mg via ORAL
  Filled 2021-06-18 (×5): qty 1

## 2021-06-18 MED ORDER — SODIUM CHLORIDE 0.9 % IV SOLN
2.0000 g | INTRAVENOUS | Status: DC
  Administered 2021-06-19: 2 g via INTRAVENOUS
  Filled 2021-06-18: qty 20
  Filled 2021-06-18: qty 2

## 2021-06-18 MED ORDER — GABAPENTIN 300 MG PO CAPS
300.0000 mg | ORAL_CAPSULE | Freq: Three times a day (TID) | ORAL | Status: DC
  Administered 2021-06-18 – 2021-06-20 (×7): 300 mg via ORAL
  Filled 2021-06-18 (×7): qty 1

## 2021-06-18 MED ORDER — ACETAMINOPHEN 325 MG PO TABS: 650.0000 mg | ORAL_TABLET | Freq: Four times a day (QID) | ORAL | Status: DC | PRN

## 2021-06-18 MED ORDER — GUAIFENESIN ER 600 MG PO TB12
1200.0000 mg | ORAL_TABLET | Freq: Two times a day (BID) | ORAL | Status: DC
  Administered 2021-06-18 – 2021-06-20 (×5): 1200 mg via ORAL
  Filled 2021-06-18 (×5): qty 2

## 2021-06-18 MED ORDER — UMECLIDINIUM BROMIDE 62.5 MCG/ACT IN AEPB
1.0000 | INHALATION_SPRAY | Freq: Every day | RESPIRATORY_TRACT | Status: DC
  Administered 2021-06-19 – 2021-06-20 (×2): 1 via RESPIRATORY_TRACT
  Filled 2021-06-18 (×2): qty 7

## 2021-06-18 MED ORDER — ORAL CARE MOUTH RINSE
15.0000 mL | Freq: Two times a day (BID) | OROMUCOSAL | Status: DC
  Administered 2021-06-18 – 2021-06-20 (×4): 15 mL via OROMUCOSAL

## 2021-06-18 MED ORDER — CEFTRIAXONE SODIUM 1 G IJ SOLR
1.0000 g | Freq: Once | INTRAMUSCULAR | Status: AC
  Administered 2021-06-18: 1 g via INTRAVENOUS
  Filled 2021-06-18: qty 10

## 2021-06-18 MED ORDER — ALPRAZOLAM 0.5 MG PO TABS
0.2500 mg | ORAL_TABLET | Freq: Two times a day (BID) | ORAL | Status: DC | PRN
  Administered 2021-06-19: 0.5 mg via ORAL
  Filled 2021-06-18: qty 1

## 2021-06-18 MED ORDER — METHYLPREDNISOLONE SODIUM SUCC 40 MG IJ SOLR
40.0000 mg | Freq: Two times a day (BID) | INTRAMUSCULAR | Status: AC
  Administered 2021-06-18 – 2021-06-19 (×2): 40 mg via INTRAVENOUS
  Filled 2021-06-18 (×2): qty 1

## 2021-06-18 MED ORDER — IPRATROPIUM-ALBUTEROL 0.5-2.5 (3) MG/3ML IN SOLN
3.0000 mL | Freq: Four times a day (QID) | RESPIRATORY_TRACT | Status: DC
  Administered 2021-06-18 – 2021-06-20 (×8): 3 mL via RESPIRATORY_TRACT
  Filled 2021-06-18 (×9): qty 3

## 2021-06-18 MED ORDER — IPRATROPIUM-ALBUTEROL 0.5-2.5 (3) MG/3ML IN SOLN
3.0000 mL | Freq: Once | RESPIRATORY_TRACT | Status: AC
  Administered 2021-06-18: 3 mL via RESPIRATORY_TRACT
  Filled 2021-06-18: qty 9

## 2021-06-18 NOTE — Care Plan (Signed)
NAME: Julia [name]  DOB: 1960-09-21  MRN: [id number]  Date/Time: 06/18/2021 1:08 PM  REQUESTING PROVIDER: agbata Subjective:  REASON FOR CONSULT: cavitary lung lesion ?pt known to me from admission in Nov 2022 Pt is sleeping and could not talk to her or examine her- Julia [name] is a 61 y.o.  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 No pleural effusion. 2. Stable moderate to large gastric hiatal hernia  Past Medical History:  Diagnosis Date   Anemia    Anxiety    Asthma    COPD (chronic obstructive pulmonary disease) ([place])    Depression    Ventral hernia     Past Surgical History:  Procedure Laterality Date   COLON SURGERY     EAR TUBE REMOVAL     FRACTURE SURGERY     HERNIA REPAIR     TUBAL LIGATION      Social History   Socioeconomic History   Marital status: Married    Spouse name: Not on file   Number of children: Not on file   Years of education: Not on file   Highest education level: Not on file  Occupational History   Not on file  Tobacco Use   Smoking status: Former    Packs/day: 0.50    Types: Cigarettes    Quit date: 04/25/2020    Years since quitting: 1.1   Smokeless tobacco: Never  Substance and Sexual Activity   Alcohol use: No   Drug use: No   Sexual activity: Not on file  Other Topics Concern   Not on file  Social History Narrative   Not on file   Social Determinants of Health   Financial Resource Strain: Not on file  Food Insecurity: Not on file  Transportation Needs: Not on file  Physical Activity: Not on file   Stress: Not on file  Social Connections: Not on file  Intimate Partner Violence: Not on file    Family History  Problem Relation Age of Onset   Sarcoidosis Mother    Alcohol abuse Father    Allergies  Allergen Reactions   Banana Nausea And Vomiting   I? Current Facility-Administered Medications  Medication Dose Route Frequency Provider Last Rate Last Admin   0.9 %  sodium chloride infusion   Intravenous Continuous Agbata, Tochukwu, MD 50 mL/hr at 06/18/21 1011 New Bag at 06/18/21 1011   acetaminophen (TYLENOL) tablet 650 mg  650 mg Oral Q6H PRN Agbata, Tochukwu, MD       Or   acetaminophen (TYLENOL) suppository 650 mg  650 mg Rectal Q6H PRN Agbata, Tochukwu, MD       albuterol (PROVENTIL) (2.5 MG/3ML) 0.083% nebulizer solution 2.5 mg  2.5 mg Nebulization Q2H PRN Agbata, Tochukwu, MD       ALPRAZolam (XANAX) tablet 0.25-0.5 mg  0.25-0.5 mg Oral BID PRN Agbata, Tochukwu, MD       [START ON 06/19/2021] azithromycin (ZITHROMAX) 500 mg in sodium chloride 0.9 % 250 mL IVPB  500 mg Intravenous Q24H Agbata, Tochukwu, MD       [START ON 06/19/2021] cefTRIAXone (ROCEPHIN) 2 g  in sodium chloride 0.9 % 100 mL IVPB  2 g Intravenous Q24H Agbata, Tochukwu, MD       docusate sodium (COLACE) capsule 100 mg  100 mg Oral Daily PRN Agbata, Tochukwu, MD       enoxaparin (LOVENOX) injection 40 mg  40 mg Subcutaneous Q24H Agbata, Tochukwu, MD       ferrous sulfate tablet 325 mg  325 mg Oral BID WC Agbata, Tochukwu, MD   325 mg at 06/18/21 1010   [START ON 06/19/2021] umeclidinium bromide (INCRUSE ELLIPTA) 62.5 MCG/ACT 1 puff  1 puff Inhalation Daily Agbata, Tochukwu, MD       And   [START ON 06/19/2021] fluticasone furoate-vilanterol (BREO ELLIPTA) 100-25 MCG/ACT 1 puff  1 puff Inhalation Daily Agbata, Tochukwu, MD       gabapentin (NEURONTIN) capsule 300 mg  300 mg Oral TID Agbata, Tochukwu, MD   300 mg at 06/18/21 1010   guaiFENesin (MUCINEX) 12 hr tablet 1,200 mg  1,200 mg Oral BID Agbata, Tochukwu, MD    1,200 mg at 06/18/21 1010   ipratropium-albuterol (DUONEB) 0.5-2.5 (3) MG/3ML nebulizer solution 3 mL  3 mL Nebulization Q6H Agbata, Tochukwu, MD       magnesium oxide (MAG-OX) tablet 400 mg  400 mg Oral Daily Agbata, Tochukwu, MD   400 mg at 06/18/21 1009   methylPREDNISolone sodium succinate (SOLU-MEDROL) 40 mg/mL injection 40 mg  40 mg Intravenous Q12H Agbata, Tochukwu, MD       Followed by   Derrill Memo ON 06/19/2021] predniSONE (DELTASONE) tablet 40 mg  40 mg Oral Q breakfast Agbata, Tochukwu, MD       ondansetron (ZOFRAN) tablet 4 mg  4 mg Oral Q6H PRN Agbata, Tochukwu, MD       Or   ondansetron (ZOFRAN) injection 4 mg  4 mg Intravenous Q6H PRN Agbata, Tochukwu, MD       pantoprazole (PROTONIX) EC tablet 40 mg  40 mg Oral Daily Agbata, Tochukwu, MD   40 mg at 06/18/21 1009   sertraline (ZOLOFT) tablet 150 mg  150 mg Oral Daily Agbata, Tochukwu, MD   150 mg at 06/18/21 1010   Current Outpatient Medications  Medication Sig Dispense Refill   acetaminophen (TYLENOL) 500 MG tablet Take 1,000 mg by mouth every 6 (six) hours as needed.     albuterol (PROVENTIL HFA;VENTOLIN HFA) 108 (90 BASE) MCG/ACT inhaler Inhale 2 puffs into the lungs every 6 (six) hours as needed for wheezing or shortness of breath. 1 Inhaler 2   albuterol (PROVENTIL) (2.5 MG/3ML) 0.083% nebulizer solution Take 2.5 mg by nebulization every 6 (six) hours as needed for wheezing or shortness of breath.     ALPRAZolam (XANAX) 0.5 MG tablet Take 0.25-0.5 mg by mouth 2 (two) times daily as needed for anxiety.     dextromethorphan-guaiFENesin (MUCINEX DM) 30-600 MG 12hr tablet Take 1 tablet by mouth 2 (two) times daily. 60 tablet 0   docusate sodium (COLACE) 100 MG capsule Take 100 mg by mouth daily as needed for mild constipation.     ferrous sulfate 325 (65 FE) MG tablet Take 1 tablet (325 mg total) by mouth 2 (two) times daily with a meal. (Patient taking differently: Take 650 mg by mouth daily with breakfast.) 60 tablet 1    Fluticasone-Umeclidin-Vilant (TRELEGY ELLIPTA IN) Inhale 1 puff into the lungs daily.     gabapentin (NEURONTIN) 300 MG capsule Take 300 mg by mouth 3 (three) times daily.     magnesium oxide (MAG-OX) 400 MG  tablet Take 400 mg by mouth daily.     pantoprazole (PROTONIX) 40 MG tablet Take 40 mg by mouth daily.     Potassium 99 MG TABS Take 2 tablets by mouth daily.     predniSONE (DELTASONE) 10 MG tablet Take 27m daily for 3days,Take 319mdaily for 3days,Take 2078maily for 3days,Take 34m22mily for 3days, then stop 30 tablet 0   sertraline (ZOLOFT) 100 MG tablet Take 150 mg by mouth daily.     acetylcysteine (MUCOMYST) 20 % nebulizer solution Take 4 mLs by nebulization 2 (two) times daily. (Patient not taking: Reported on 03/18/2021) 30 mL 12   ipratropium-albuterol (DUONEB) 0.5-2.5 (3) MG/3ML SOLN Take 3 mLs by nebulization as directed. (Patient not taking: Reported on 06/18/2021)     nicotine (NICODERM CQ - DOSED IN MG/24 HOURS) 21 mg/24hr patch Place 1 patch (21 mg total) onto the skin daily. (Patient not taking: Reported on 06/18/2021) 28 patch 0     Abtx:  Anti-infectives (From admission, onward)    Start     Dose/Rate Route Frequency Ordered Stop   06/19/21 0700  azithromycin (ZITHROMAX) 500 mg in sodium chloride 0.9 % 250 mL IVPB        500 mg 250 mL/hr over 60 Minutes Intravenous Every 24 hours 06/18/21 1234     06/19/21 0630  cefTRIAXone (ROCEPHIN) 2 g in sodium chloride 0.9 % 100 mL IVPB        2 g 200 mL/hr over 30 Minutes Intravenous Every 24 hours 06/18/21 1234     06/18/21 0615  cefTRIAXone (ROCEPHIN) 1 g in sodium chloride 0.9 % 100 mL IVPB        1 g 200 mL/hr over 30 Minutes Intravenous  Once 06/18/21 0612 06/18/21 0649   06/18/21 0615  azithromycin (ZITHROMAX) 500 mg in sodium chloride 0.9 % 250 mL IVPB        500 mg 250 mL/hr over 60 Minutes Intravenous  Once 06/18/21 0612 06/18/21 0935       REVIEW OF SYSTEMS:   NA as she is sleeping Objective:  VITALS:  BP (!)  80/54    Pulse 63    Temp 98.1 F (36.7 C) (Tympanic)    Resp (!) 21    Ht _0  (1.651 m)    Wt 58.5 kg    SpO2 95%    BMI 21.47 kg/m  Pertinent Labs Lab Results CBC    Component Value Date/Time   WBC 11.9 (H) 06/18/2021 0539   RBC 4.08 06/18/2021 0539   HGB 10.5 (L) 06/18/2021 0539   HGB 8.3 (L) 04/02/2014 0647   HCT 34.6 (L) 06/18/2021 0539   HCT 26.7 (L) 04/02/2014 0647   PLT 175 06/18/2021 0539   PLT 243 04/02/2014 0647   MCV 84.8 06/18/2021 0539   MCV 79 (L) 04/02/2014 0647   MCH 25.7 (L) 06/18/2021 0539   MCHC 30.3 06/18/2021 0539   RDW 17.2 (H) 06/18/2021 0539   RDW 21.4 (H) 04/02/2014 0647   LYMPHSABS 0.8 06/18/2021 0539   LYMPHSABS 0.9 (L) 04/02/2014 0647   MONOABS 0.8 06/18/2021 0539   MONOABS 0.4 04/02/2014 0647   EOSABS 0.0 06/18/2021 0539   EOSABS 0.0 04/02/2014 0647   BASOSABS 0.0 06/18/2021 0539   BASOSABS 0.0 04/02/2014 0647    CMP Latest Ref Rng & Units 06/18/2021 03/18/2021 07/23/2020  Glucose 70 - 99 mg/dL 116(H) 126(H) 129(H)  BUN 8 - 23 mg/dL _1 Creatinine 0.44 - 1.00 mg/dL 0.66  0.78 0.84  Sodium 135 - 145 mmol/L 138 139 138  Potassium 3.5 - 5.1 mmol/L 4.7 4.3 4.1  Chloride 98 - 111 mmol/L 102 103 102  CO2 22 - 32 mmol/L _0 Calcium 8.9 - 10.3 mg/dL 10.0 9.9 10.0  Total Protein 6.5 - 8.1 g/dL 7.2 7.5 -  Total Bilirubin 0.3 - 1.2 mg/dL 0.4 0.2(L) -  Alkaline Phos 38 - 126 U/L 57 56 -  AST 15 - 41 U/L 24 22 -  ALT 0 - 44 U/L 18 14 -      Microbiology: Recent Results (from the past 240 hour(s))  Culture, blood (routine x 2)     Status: None (Preliminary result)   Collection Time: 06/18/21  5:39 AM   Specimen: BLOOD  Result Value Ref Range Status   Specimen Description BLOOD LEFT AC  Final   Special Requests   Final    BOTTLES DRAWN AEROBIC AND ANAEROBIC Blood Culture adequate volume   Culture   Final    NO GROWTH < 12 HOURS Performed at [facility], [address], [place], [place] [address]    Report Status  PENDING  Incomplete  Resp Panel by RT-PCR (Flu A&B, Covid) Nasopharyngeal Swab     Status: None   Collection Time: 06/18/21  5:39 AM   Specimen: Nasopharyngeal Swab; Nasopharyngeal(NP) swabs in vial transport medium  Result Value Ref Range Status   SARS Coronavirus 2 by RT PCR NEGATIVE NEGATIVE Final    Comment: (NOTE) SARS-CoV-2 target nucleic acids are NOT DETECTED.  The SARS-CoV-2 RNA is generally detectable in upper respiratory specimens during the acute phase of infection. The lowest concentration of SARS-CoV-2 viral copies this assay can detect is 138 copies/mL. A negative result does not preclude SARS-Cov-2 infection and should not be used as the sole basis for treatment or other patient management decisions. A negative result may occur with  improper specimen collection/handling, submission of specimen other than nasopharyngeal swab, presence of viral mutation(s) within the areas targeted by this assay, and inadequate number of viral copies(<138 copies/mL). A negative result must be combined with clinical observations, patient history, and epidemiological information. The expected result is Negative.  Fact Sheet for Patients:  EntrepreneurPulse.com.au  Fact Sheet for Healthcare Providers:  IncredibleEmployment.be  This test is no t yet approved or cleared by the [place] FDA and  has been authorized for detection and/or diagnosis of SARS-CoV-2 by FDA under an Emergency Use Authorization (EUA). This EUA will remain  in effect (meaning this test can be used) for the duration of the COVID-19 declaration under Section 564(b)(1) of the Act, 21 U.S.C.section 360bbb-3(b)(1), unless the authorization is terminated  or revoked sooner.       Influenza A by PCR NEGATIVE NEGATIVE Final   Influenza B by PCR NEGATIVE NEGATIVE Final    Comment: (NOTE) The Xpert Xpress SARS-CoV-2/FLU/RSV plus assay is intended as an aid in the diagnosis of  influenza from Nasopharyngeal swab specimens and should not be used as a sole basis for treatment. Nasal washings and aspirates are unacceptable for Xpert Xpress SARS-CoV-2/FLU/RSV testing.  Fact Sheet for Patients: EntrepreneurPulse.com.au  Fact Sheet for Healthcare Providers: IncredibleEmployment.be  This test is not yet approved or cleared by the [place] FDA and has been authorized for detection and/or diagnosis of SARS-CoV-2 by FDA under an Emergency Use Authorization (EUA). This EUA will remain in effect (meaning this test can be used) for the duration of the COVID-19 declaration under Section 564(b)(1)  of the Act, 21 U.S.C. section 360bbb-3(b)(1), unless the authorization is terminated or revoked.  Performed at [facility], [address] [place], [place] [address]   Culture, blood (routine x 2)     Status: None (Preliminary result)   Collection Time: 06/18/21  5:40 AM   Specimen: BLOOD  Result Value Ref Range Status   Specimen Description BLOOD RIGHT [facility]  Final   Special Requests   Final    BOTTLES DRAWN AEROBIC AND ANAEROBIC Blood Culture results may not be optimal due to an excessive volume of blood received in culture bottles   Culture   Final    NO GROWTH < 12 HOURS Performed at [facility], [address] [place], [place] [address]    Report Status PENDING  Incomplete    IMAGING RESULTS:  I have personally reviewed the films ? Impression/Recommendation Pt with COPD with nodularity lung followed at [facility] pulmonology , had 3 sputums in NOV negative for afb presented with COPD exacerbation CT chest worsning wit more nodular opacities- no diagnosis yet as OP. Recommend pulmonary consult Will need bronch for better sample collection for MAC or other atypical mycobacteria  Continue IV ceftriaxone and azihro  Will do a full consult  tomorrow   ? ___________________________________________________ Discussed with patient, requesting provider Note:  This document was prepared using Dragon voice recognition software and may include unintentional dictation errors.

## 2021-06-18 NOTE — ED Provider Notes (Signed)
[facility] Provider Note    Event Date/Time   First MD Initiated Contact with Patient 06/18/21 0533     (approximate)   History   Respiratory Distress   HPI  Level V caveat: Limited by respiratory distress  Julia [name] is a 61 y.o. female brought to the ED via EMS from home with a chief complaint of respiratory distress.  Patient has a history of COPD on 2 L nasal cannula oxygen at nighttime only.  Currently on amoxicillin and prednisone for presumed bronchitis.  Reports administering 5 albuterol nebulizers in the last 24 hours without relief of symptoms.  EMS reports patient was 81% on 10 L nasal cannula oxygen; arrives to the ED on CPAP.  Given 2 DuoNeb's, 1 albuterol nebulizer treatment, 125 mg IV Solu-Medrol prior to arrival.  Patient endorses chest tightness, cough and shortness of breath.  Denies fever, chills, abdominal pain, nausea, vomiting or dizziness.     Past Medical History   Past Medical History:  Diagnosis Date   Anemia    Anxiety    Asthma    COPD (chronic obstructive pulmonary disease) (HCC)    Depression    Ventral hernia      Active Problem List   Patient Active Problem List   Diagnosis Date Noted   Lung nodule 03/18/2021   Atypical pneumonia 03/18/2021   Severe sepsis (HCC) 03/18/2021   Depression with anxiety 03/18/2021   Iron deficiency anemia 03/18/2021   Incisional hernia, without obstruction or gangrene 04/22/2020   Acute respiratory failure with hypoxia (HCC) 11/08/2015   COPD exacerbation (HCC) 04/07/2015     Past Surgical History   Past Surgical History:  Procedure Laterality Date   COLON SURGERY     EAR TUBE REMOVAL     FRACTURE SURGERY     HERNIA REPAIR     TUBAL LIGATION       Home Medications   Prior to Admission medications   Medication Sig Start Date End Date Taking? Authorizing Provider  acetaminophen (TYLENOL) 500 MG tablet Take 1,000 mg by mouth every 6 (six) hours as needed.   Yes  [provider]  albuterol (PROVENTIL HFA;VENTOLIN HFA) 108 (90 BASE) MCG/ACT inhaler Inhale 2 puffs into the lungs every 6 (six) hours as needed for wheezing or shortness of breath. 04/09/15  Yes Mody, Patricia Pesa, MD  albuterol (PROVENTIL) (2.5 MG/3ML) 0.083% nebulizer solution Take 2.5 mg by nebulization every 6 (six) hours as needed for wheezing or shortness of breath.   Yes [provider]  ALPRAZolam (XANAX) 0.5 MG tablet Take 0.25-0.5 mg by mouth 2 (two) times daily as needed for anxiety. 12/17/20 12/17/21 Yes [provider]  dextromethorphan-guaiFENesin (MUCINEX DM) 30-600 MG 12hr tablet Take 1 tablet by mouth 2 (two) times daily. 03/20/21  Yes Rolly Salter, MD  docusate sodium (COLACE) 100 MG capsule Take 100 mg by mouth daily as needed for mild constipation.   Yes [provider]  ferrous sulfate 325 (65 FE) MG tablet Take 1 tablet (325 mg total) by mouth 2 (two) times daily with a meal. Patient taking differently: Take 650 mg by mouth daily with breakfast. 11/10/15  Yes Altamese Dilling, MD  Fluticasone-Umeclidin-Vilant (TRELEGY ELLIPTA IN) Inhale 1 puff into the lungs daily.   Yes [provider]  gabapentin (NEURONTIN) 300 MG capsule Take 300 mg by mouth 3 (three) times daily. 12/17/20  Yes [provider]  magnesium oxide (MAG-OX) 400 MG tablet Take 400 mg by mouth  daily.   Yes [provider]  pantoprazole (PROTONIX) 40 MG tablet Take 40 mg by mouth daily.   Yes [provider]  Potassium 99 MG TABS Take 2 tablets by mouth daily.   Yes [provider]  predniSONE (DELTASONE) 10 MG tablet Take 40mg  daily for 3days,Take 30mg  daily for 3days,Take 20mg  daily for 3days,Take 10mg  daily for 3days, then stop 03/20/21  Yes , MD  sertraline (ZOLOFT) 100 MG tablet Take 150 mg by mouth daily.   Yes [provider]  acetylcysteine (MUCOMYST) 20 % nebulizer solution Take 4 mLs by nebulization 2  (two) times daily. Patient not taking: Reported on 03/18/2021 03/24/16   03/22/21, MD  ipratropium-albuterol (DUONEB) 0.5-2.5 (3) MG/3ML SOLN Take 3 mLs by nebulization as directed. Patient not taking: Reported on 06/18/2021 02/05/21   [provider]  nicotine (NICODERM CQ - DOSED IN MG/24 HOURS) 21 mg/24hr patch Place 1 patch (21 mg total) onto the skin daily. Patient not taking: Reported on 06/18/2021 03/21/21   06/20/2021, MD     Allergies  Banana   Family History   Family History  Problem Relation Age of Onset   Sarcoidosis Mother    Alcohol abuse Father      Physical Exam  Triage Vital Signs: ED Triage Vitals  Enc Vitals Group     BP      Pulse      Resp      Temp      Temp src      SpO2      Weight      Height      Head Circumference      Peak Flow      Pain Score      Pain Loc      Pain Edu?      Excl. in GC?     Updated Vital Signs: BP 101/61    Pulse 92    Temp 98.1 F (36.7 C) (Tympanic)    Resp (!) 29    Ht 5\' 5"  (1.651 m)    Wt 58.5 kg    SpO2 96%    BMI 21.47 kg/m    General: Awake, moderate distress.  CV:  RRR.  Good peripheral perfusion.  Resp:  Increased effort.  Diminished aeration.  Tripoding. Abd:  Nontender.  No distention.  Other:  No petechiae.   ED Results / Procedures / Treatments  Labs (all labs ordered are listed, but only abnormal results are displayed) Labs Reviewed  CBC WITH DIFFERENTIAL/PLATELET - Abnormal; Notable for the following components:      Result Value   WBC 11.9 (*)    Hemoglobin 10.5 (*)    HCT 34.6 (*)    MCH 25.7 (*)    RDW 17.2 (*)    Neutro Abs 10.2 (*)    All other components within normal limits  COMPREHENSIVE METABOLIC PANEL - Abnormal; Notable for the following components:   Glucose, Bld 116 (*)    All other components within normal limits  BRAIN NATRIURETIC PEPTIDE - Abnormal; Notable for the following components:   B Natriuretic Peptide 192.4 (*)    All other components  within normal limits  MAGNESIUM - Abnormal; Notable for the following components:   Magnesium 1.6 (*)    All other components within normal limits  BLOOD GAS, VENOUS - Abnormal; Notable for the following components:   pO2, Ven 65.0 (*)    All other components within normal  limits  TROPONIN I (HIGH SENSITIVITY) - Abnormal; Notable for the following components:   Troponin I (High Sensitivity) 37 (*)    All other components within normal limits  RESP PANEL BY RT-PCR (FLU A&B, COVID) ARPGX2  CULTURE, BLOOD (ROUTINE X 2)  CULTURE, BLOOD (ROUTINE X 2)  LACTIC ACID, PLASMA     EKG  ED ECG REPORT I, [place] J, the attending physician, personally viewed and interpreted this ECG.   Date: 06/18/2021  EKG Time: 0538  Rate: 87  Rhythm: normal sinus rhythm  Axis: Normal  Intervals:none  ST&T Change: Nonspecific    RADIOLOGY I have independently visualized and interpreted patient's chest x-ray as well as reviewed the radiology interpretation:  X-ray: Interval Progression of chronic lung disease, new 3 cm left upper lobe nodule with central cavitation recommend CT scan  Official radiology report(s): DG Chest Port 1 View  Result Date: 06/18/2021 CLINICAL DATA:  Shortness of breath. Difficulty breathing. History of COPD. EXAM: PORTABLE CHEST 1 VIEW COMPARISON:  03/18/2021 FINDINGS: Stable cardiomediastinal contours. Again noted is diffuse increased reticular and nodular opacities throughout both lungs which appears increased from the previous exam. Within the left upper lobe there is a new 3 cm nodule with central cavitation. Unchanged appearance new perihilar opacity is noted within the right midlung. Within the medial right lung base there is a persistent opacity which may represent an area of atelectasis, scarring or pneumonia. IMPRESSION: 1. Interval progression of chronic lung disease with increased reticular and nodular opacities throughout both lungs. Atypical/mycobacterium infectious  process was favored at that time, although malignancy was not excluded. 2. New 3 cm left upper lobe nodule with central cavitation. Additionally there is a new perihilar opacity in the right midlung and a persistent opacity within the medial right lung base. Recommend further evaluation with CT of the chest for more definitive characterization. Electronically Signed   By: Signa Kell M.D.   On: 06/18/2021 06:09     PROCEDURES:  Critical Care performed: Yes, see critical care procedure note(s)  CRITICAL CARE Performed by: Irean Hong   Total critical care time: 45 minutes  Critical care time was exclusive of separately billable procedures and treating other patients.  Critical care was necessary to treat or prevent imminent or life-threatening deterioration.  Critical care was time spent personally by me on the following activities: development of treatment plan with patient and/or surrogate as well as nursing, discussions with consultants, evaluation of patient's response to treatment, examination of patient, obtaining history from patient or surrogate, ordering and performing treatments and interventions, ordering and review of laboratory studies, ordering and review of radiographic studies, pulse oximetry and re-evaluation of patient's condition.   Marland Kitchen1-3 Lead EKG Interpretation Performed by: Irean Hong, MD Authorized by: Irean Hong, MD     Interpretation: normal     ECG rate:  85   ECG rate assessment: normal     Rhythm: sinus rhythm     Ectopy: none     Conduction: normal   Comments:     Patient placed on cardiac monitor to evaluate for arrhythmias   MEDICATIONS ORDERED IN ED: Medications  magnesium sulfate IVPB 2 g 50 mL (2 g Intravenous New Bag/Given 06/18/21 0558)  cefTRIAXone (ROCEPHIN) 1 g in sodium chloride 0.9 % 100 mL IVPB (1 g Intravenous New Bag/Given 06/18/21 0619)  azithromycin (ZITHROMAX) 500 mg in sodium chloride 0.9 % 250 mL IVPB (has no administration in  time range)  ipratropium-albuterol (DUONEB) 0.5-2.5 (3) MG/3ML nebulizer  solution 3 mL (3 mLs Nebulization Given 06/18/21 0538)  ipratropium-albuterol (DUONEB) 0.5-2.5 (3) MG/3ML nebulizer solution 3 mL (3 mLs Nebulization Given 06/18/21 0538)  ipratropium-albuterol (DUONEB) 0.5-2.5 (3) MG/3ML nebulizer solution 3 mL (3 mLs Nebulization Given 06/18/21 0536)     IMPRESSION / MDM / ASSESSMENT AND PLAN / ED COURSE  I reviewed the triage vital signs and the nursing notes.                             61 year old female with COPD presenting with respiratory distress and hypoxia. Differential includes, but is not limited to, viral syndrome, bronchitis including COPD exacerbation, pneumonia, reactive airway disease including asthma, CHF including exacerbation with or without pulmonary/interstitial edema, pneumothorax, ACS, thoracic trauma, and pulmonary embolism.  I have personally reviewed patient's records and see that she had a PCP office visit on 06/09/2021 for chronic bilateral arm and neck pain.  The patient is on the cardiac monitor to evaluate for evidence of arrhythmia and/or significant heart rate changes.  We will obtain sepsis lab work, chest x-ray.  Patient placed on BiPAP upon her arrival to the treatment room.  Will administer stacked DuoNebs, administer 2 g IV magnesium.  Anticipate hospitalization.  Clinical Course as of 06/18/21 0640  Thu Jun 18, 2021  0546 RT experienced difficulty obtaining ABG; VBG obtained instead. [JS]  [id number] Chest x-ray noted to have new left upper lung nodule with central cavitary lesion, recommend CT scan.  Patient is unable to lay flat at this time secondary to increased work of breathing, although overall is improved on BiPAP.  Will cover with empiric IV antibiotics to cover community-acquired pneumonia.  Will place on airborne precautions.  Will consult hospitalist services for evaluation and admission. [JS]  [id number] Laboratory results demonstrate WBC 11.9, normal  electrolytes, mildly elevated troponin 37, BNP mildly elevated 192, magnesium slightly low 1.6, normal lactic acid.  VBG 7.3 7/47/65 [JS]    Clinical Course User Index [JS] Irean HongSung, Sheyna Pettibone J, MD     FINAL CLINICAL IMPRESSION(S) / ED DIAGNOSES   Final diagnoses:  Acute respiratory failure with hypoxia (HCC)  COPD exacerbation (HCC)  Respiratory distress  Cavitary lesion of lung  Hypomagnesemia     Rx / DC Orders   ED Discharge Orders     None        Note:  This document was prepared using Dragon voice recognition software and may include unintentional dictation errors.   Irean HongSung, Citlaly Camplin J, MD 06/18/21 [phone]

## 2021-06-18 NOTE — H&P (Addendum)
History and Physical    Patient: Julia [name] KDX:[id number] DOB: 09/06/1960 DOA: 06/18/2021 DOS: the patient was seen and examined on 06/18/2021 PCP: Olena Leatherwood, FNP  Patient coming from: Home  Chief Complaint:  Chief Complaint  Patient presents with   Respiratory Distress    HPI: MADELIN WESEMAN is a 61 y.o. female with medical history significant for COPD with chronic respiratory failure on 2 L of oxygen at night, depression, nicotine dependence, ventral hernia who presents to the ER via EMS for evaluation of worsening shortness of breath. Patient states that she has had symptoms for 2 days and reports shortness of breath mostly with exertion.  Her symptoms got worse on the morning of admission and when she called EMS she was found to have room air pulse oximetry of 81%.  She was placed on 10 L of oxygen and received 2 DuoNebs, 1 albuterol treatment and 125 mg of Solu-Medrol. Shortness of breath is associated with chest tightness and a nonproductive cough.  She also complains of subjective fever but denies having any chills, no abdominal pain, no nausea, no vomiting, no dizziness, no headache or lightheadedness. She was transitioned to BiPAP upon arrival to the ER.  Review of Systems: As mentioned in the history of present illness. All other systems reviewed and are negative. Past Medical History:  Diagnosis Date   Anemia    Anxiety    Asthma    COPD (chronic obstructive pulmonary disease) (HCC)    Depression    Ventral hernia    Past Surgical History:  Procedure Laterality Date   COLON SURGERY     EAR TUBE REMOVAL     FRACTURE SURGERY     HERNIA REPAIR     TUBAL LIGATION     Social History:  reports that she quit smoking about 13 months ago. Her smoking use included cigarettes. She smoked an average of .5 packs per day. She has never used smokeless tobacco. She reports that she does not drink alcohol and does not use drugs.  Allergies  Allergen Reactions    Banana Nausea And Vomiting    Family History  Problem Relation Age of Onset   Sarcoidosis Mother    Alcohol abuse Father     Prior to Admission medications   Medication Sig Start Date End Date Taking? Authorizing Provider  acetaminophen (TYLENOL) 500 MG tablet Take 1,000 mg by mouth every 6 (six) hours as needed.   Yes [provider]  albuterol (PROVENTIL HFA;VENTOLIN HFA) 108 (90 BASE) MCG/ACT inhaler Inhale 2 puffs into the lungs every 6 (six) hours as needed for wheezing or shortness of breath. 04/09/15  Yes Mody, Patricia Pesa, MD  albuterol (PROVENTIL) (2.5 MG/3ML) 0.083% nebulizer solution Take 2.5 mg by nebulization every 6 (six) hours as needed for wheezing or shortness of breath.   Yes [provider]  ALPRAZolam (XANAX) 0.5 MG tablet Take 0.25-0.5 mg by mouth 2 (two) times daily as needed for anxiety. 12/17/20 12/17/21 Yes [provider]  dextromethorphan-guaiFENesin (MUCINEX DM) 30-600 MG 12hr tablet Take 1 tablet by mouth 2 (two) times daily. 03/20/21  Yes Rolly Salter, MD  docusate sodium (COLACE) 100 MG capsule Take 100 mg by mouth daily as needed for mild constipation.   Yes [provider]  ferrous sulfate 325 (65 FE) MG tablet Take 1 tablet (325 mg total) by mouth 2 (two) times daily with a meal. Patient taking differently: Take 650 mg by mouth daily with breakfast. 11/10/15  Yes  Altamese Dilling, MD  Fluticasone-Umeclidin-Vilant (TRELEGY ELLIPTA IN) Inhale 1 puff into the lungs daily.   Yes [provider]  gabapentin (NEURONTIN) 300 MG capsule Take 300 mg by mouth 3 (three) times daily. 12/17/20  Yes [provider]  magnesium oxide (MAG-OX) 400 MG tablet Take 400 mg by mouth daily.   Yes [provider]  pantoprazole (PROTONIX) 40 MG tablet Take 40 mg by mouth daily.   Yes [provider]  Potassium 99 MG TABS Take 2 tablets by mouth daily.   Yes [provider]  predniSONE (DELTASONE) 10 MG  tablet Take 40mg  daily for 3days,Take 30mg  daily for 3days,Take 20mg  daily for 3days,Take 10mg  daily for 3days, then stop 03/20/21  Yes Rolly Salter, MD  sertraline (ZOLOFT) 100 MG tablet Take 150 mg by mouth daily.   Yes [provider]  acetylcysteine (MUCOMYST) 20 % nebulizer solution Take 4 mLs by nebulization 2 (two) times daily. Patient not taking: Reported on 03/18/2021 03/24/16   Auburn Bilberry, MD  ipratropium-albuterol (DUONEB) 0.5-2.5 (3) MG/3ML SOLN Take 3 mLs by nebulization as directed. Patient not taking: Reported on 06/18/2021 02/05/21   [provider]  nicotine (NICODERM CQ - DOSED IN MG/24 HOURS) 21 mg/24hr patch Place 1 patch (21 mg total) onto the skin daily. Patient not taking: Reported on 06/18/2021 03/21/21   Rolly Salter, MD    Physical Exam: Vitals:   06/18/21 0600 06/18/21 0630 06/18/21 0700 06/18/21 0849  BP: (!) 101/57 101/61 (!) 102/52   Pulse: 83 92 83   Resp: (!) 47 (!) 29 (!) 22   Temp:      TempSrc:      SpO2: 100% 96% 96% 95%  Weight:      Height:       Physical Exam Vitals and nursing note reviewed.  Constitutional:      Appearance: She is normal weight.     Comments: Appears comfortable on BiPAP  HENT:     Head: Normocephalic and atraumatic.     Nose: Nose normal.     Mouth/Throat:     Mouth: Mucous membranes are moist.  Eyes:     Pupils: Pupils are equal, round, and reactive to light.  Cardiovascular:     Rate and Rhythm: Normal rate and regular rhythm.  Pulmonary:     Effort: Pulmonary effort is normal.     Breath sounds: Wheezing present.  Abdominal:     General: Bowel sounds are normal.     Palpations: Abdomen is soft.     Hernia: A hernia is present.     Comments: Ventral wall hernia  Musculoskeletal:        General: Normal range of motion.     Cervical back: Normal range of motion and neck supple.  Skin:    General: Skin is warm and dry.  Neurological:     General: No focal deficit present.     Mental  Status: She is alert and oriented to person, place, and time.  Psychiatric:        Mood and Affect: Mood normal.        Behavior: Behavior normal.     Data Reviewed: Notes from primary care and specialist visits, past discharge summaries. Prior diagnostic testing as applicable to current admission diagnoses Updated medications and problem lists for reconciliation ED course, including vitals, labs, imaging, treatment and response to treatment Triage notes and ED providers notes BNP 192.4, magnesium 1.6, Arterial blood gas reviewed and appears compensated  with hypoxia Respiratory viral panel is negative Chest x-ray reviewed by me shows interval progression of chronic lung disease with increased reticular and nodular opacities throughout both lungs. 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 New 3 cm left upper lobe nodule with central cavitation. Additionally there is a new perihilar opacity in the right midlung and a persistent opacity within the medial right lung base. CT scan of the chest has been recommended Twelve-lead EKG reviewed by me shows normal sinus rhythm There are no new results to review at this time.  Assessment and Plan: Principal Problem:   COPD with acute exacerbation (HCC) Active Problems:   Acute on chronic respiratory failure with hypoxia (HCC)   Depression with anxiety   Nicotine dependence   Hypomagnesemia   Elevated troponin    COPD with acute exacerbation Patient with a history of COPD who presents to the ER for evaluation of worsening shortness of breath associated with chest tightness and wheezing. Place patient on scheduled and as needed bronchodilator therapy Place patient on systemic steroids as well as antitussives     Acute on chronic respiratory failure Secondary to acute COPD exacerbation At baseline patient wears 2 L of oxygen at night but was found to have room air pulse  oximetry of 81% with increased work of breathing and tachypnea and is currently on BiPAP to improve oxygenation and reduce work of breathing. We will attempt to wean off oxygen as tolerated    Nicotine dependence Smoking cessation has been discussed with patient in detail She declines a nicotine transdermal patch     Anxiety and depression Continue Zoloft and alprazolam     Left upper lung nodule Noted on chest x-ray with central cavitation ??  Atypical mycobacterial infection versus lung mass We will request ID consult Obtain CT scan of the chest without contrast for further evaluation    Hypomagnesemia Supplement magnesium    Elevated troponin Probably secondary to demand ischemia from respiratory distress We will cycle cardiac enzymes Place patient on aspirin Obtain 2D echocardiogram to assess LVEF and rule out regional wall motion abnormality We will request cardiology consult  Advance Care Planning:   Code Status: Full Code   Consults: Infectious disease  Family Communication: Greater than 50% of time was spent discussing patient's condition and plan of care with her at the bedside.  All questions and concerns have been addressed.  She verbalizes understanding and agrees with the plan.  Severity of Illness: The appropriate patient status for this patient is INPATIENT. Inpatient status is judged to be reasonable and necessary in order to provide the required intensity of service to ensure the patient's safety. The patient's presenting symptoms, physical exam findings, and initial radiographic and laboratory data in the context of their chronic comorbidities is felt to place them at high risk for further clinical deterioration. Furthermore, it is not anticipated that the patient will be medically stable for discharge from the hospital within 2 midnights of admission.   * I certify that at the point of admission it is my clinical judgment that the patient will require  inpatient hospital care spanning beyond 2 midnights from the point of admission due to high intensity of service, high risk for further deterioration and high frequency of surveillance required.*  Author: Lucile Shutters, MD 06/18/2021 9:49 AM  For on call review [email or web address].

## 2021-06-18 NOTE — ED Triage Notes (Signed)
Report per EMS. Reports difficulty breathing since this morning. 81% on 10 liters nasal cannula. Currently on bipap at 98%. Wears 3 liters at night at baseline. 2 duonebs, 1 albuterol treatment, 125 soulmedrol.  Hx COPD

## 2021-06-18 NOTE — Consult Note (Signed)
[facility] Consultation Note  Patient ID: Julia [name], MRN: [id number], DOB/AGE: 08-Dec-1960 61 y.o. Admit date: 06/18/2021   Date of Consult: 06/18/2021 Primary Physician: Romualdo Bolk, FNP Primary Cardiologist: None  Chief Complaint:  Chief Complaint  Patient presents with   Respiratory Distress   Reason for Consult:  Cough congestion shortness of breath and chest pain with elevated troponin  HPI: 61 y.o. female with known chronic obstructive pulmonary disease chronically on 2 L of oxygen with appropriate medication management and pulmonary treatment.  The patient recently has had more cough congestion shortness of breath and was seen in the emergency room.  At that time she was slightly more hypoxic than her normal.  In addition to that she had some chest pain although it did correlate to her cough and congestion rather than any true angina.  Chest x-ray showed chronic obstructive pulmonary disease with a CT showing coronary artery disease and chronic obstructive pulmonary disease.  The patient does have a white blood cell count of 11.5 consistent with new infection.  Additionally the patient has had elevated troponin of 37 and 275 possibly consistent with demand ischemia.  EKG shows normal sinus rhythm normal EKG.  The patient is resting comfortably at this time and hemodynamically stable  Past Medical History:  Diagnosis Date   Anemia    Anxiety    Asthma    COPD (chronic obstructive pulmonary disease) ([place])    Depression    Ventral hernia       Surgical History:  Past Surgical History:  Procedure Laterality Date   COLON SURGERY     EAR TUBE REMOVAL     FRACTURE SURGERY     HERNIA REPAIR     TUBAL LIGATION       Home Meds: Prior to Admission medications   Medication Sig Start Date End Date Taking? Authorizing Provider  acetaminophen (TYLENOL) 500 MG tablet Take 1,000 mg by mouth every 6 (six) hours as needed.   Yes [provider]  albuterol (PROVENTIL HFA;VENTOLIN HFA) 108 (90 BASE) MCG/ACT inhaler Inhale 2 puffs into the lungs every 6 (six) hours as needed for wheezing or shortness of breath. 04/09/15  Yes Mody, Ulice Bold, MD  albuterol (PROVENTIL) (2.5 MG/3ML) 0.083% nebulizer solution Take 2.5 mg by nebulization every 6 (six) hours as needed for wheezing or shortness of breath.   Yes [provider]  ALPRAZolam (XANAX) 0.5 MG tablet Take 0.25-0.5 mg by mouth 2 (two) times daily as needed for anxiety. 12/17/20 12/17/21 Yes [provider]  dextromethorphan-guaiFENesin (MUCINEX DM) 30-600 MG 12hr tablet Take 1 tablet by mouth 2 (two) times daily. 03/20/21  Yes Lavina Hamman, MD  docusate sodium (COLACE) 100 MG capsule Take 100 mg by mouth daily as needed for mild constipation.   Yes [provider]  ferrous sulfate 325 (65 FE) MG tablet Take 1 tablet (325 mg total) by mouth 2 (two) times daily with a meal. Patient taking differently: Take 650 mg by mouth daily with breakfast. 11/10/15  Yes Vaughan Basta, MD  Fluticasone-Umeclidin-Vilant (TRELEGY ELLIPTA IN) Inhale 1 puff into the lungs daily.   Yes [provider]  gabapentin (NEURONTIN) 300 MG capsule Take 300 mg by mouth 3 (three) times daily. 12/17/20  Yes [provider]  magnesium oxide (MAG-OX) 400 MG tablet Take 400 mg by mouth daily.   Yes [provider]  pantoprazole (PROTONIX) 40 MG tablet Take 40 mg by mouth daily.   Yes [provider]  Potassium 99 MG TABS Take 2 tablets by mouth daily.   Yes [provider]  predniSONE (DELTASONE) 10 MG tablet Take 40mg  daily for 3days,Take 30mg  daily for 3days,Take 20mg  daily for 3days,Take 10mg  daily for 3days, then stop 03/20/21  Yes Lavina Hamman, MD  sertraline (ZOLOFT) 100 MG tablet Take 150 mg by mouth daily.   Yes [provider]  acetylcysteine (MUCOMYST) 20 % nebulizer solution Take 4 mLs by nebulization 2 (two) times  daily. Patient not taking: Reported on 03/18/2021 03/24/16   Dustin Flock, MD  ipratropium-albuterol (DUONEB) 0.5-2.5 (3) MG/3ML SOLN Take 3 mLs by nebulization as directed. Patient not taking: Reported on 06/18/2021 02/05/21   [provider]  nicotine (NICODERM CQ - DOSED IN MG/24 HOURS) 21 mg/24hr patch Place 1 patch (21 mg total) onto the skin daily. Patient not taking: Reported on 06/18/2021 03/21/21   Lavina Hamman, MD    Inpatient Medications:   enoxaparin (LOVENOX) injection  40 mg Subcutaneous Q24H   ferrous sulfate  325 mg Oral BID WC   [START ON 06/19/2021] umeclidinium bromide  1 puff Inhalation Daily   And   [START ON 06/19/2021] fluticasone furoate-vilanterol  1 puff Inhalation Daily   gabapentin  300 mg Oral TID   guaiFENesin  1,200 mg Oral BID   ipratropium-albuterol  3 mL Nebulization Q6H   magnesium oxide  400 mg Oral Daily   methylPREDNISolone (SOLU-MEDROL) injection  40 mg Intravenous Q12H   Followed by   Derrill Memo ON 06/19/2021] predniSONE  40 mg Oral Q breakfast   pantoprazole  40 mg Oral Daily   sertraline  150 mg Oral Daily    sodium chloride 50 mL/hr at 06/18/21 1011   [START ON 06/19/2021] azithromycin     [START ON 06/19/2021] cefTRIAXone (ROCEPHIN)  IV      Allergies:  Allergies  Allergen Reactions   Banana Nausea And Vomiting    Social History   Socioeconomic History   Marital status: Married    Spouse name: Not on file   Number of children: Not on file   Years of education: Not on file   Highest education level: Not on file  Occupational History   Not on file  Tobacco Use   Smoking status: Former    Packs/day: 0.50    Types: Cigarettes    Quit date: 04/25/2020    Years since quitting: 1.1   Smokeless tobacco: Never  Substance and Sexual Activity   Alcohol use: No   Drug use: No   Sexual activity: Not on file  Other Topics Concern   Not on file  Social History Narrative   Not on file   Social  Determinants of Health   Financial Resource Strain: Not on file  Food Insecurity: Not on file  Transportation Needs: Not on file  Physical Activity: Not on file  Stress: Not on file  Social Connections: Not on file  Intimate Partner Violence: Not on file     Family History  Problem Relation Age of Onset   Sarcoidosis Mother    Alcohol abuse Father      Review of Systems Positive for cough congestion Negative for: General:  chills, fever, night sweats or weight changes.  Cardiovascular: PND orthopnea syncope dizziness  Dermatological skin lesions rashes Respiratory: Positive for cough congestion Urologic: Frequent urination urination at night and hematuria Abdominal: negative for nausea, vomiting, diarrhea, bright red blood per rectum, melena, or hematemesis Neurologic: negative for visual changes, and/or hearing  changes  All other systems reviewed and are otherwise negative except as noted above.  Labs: No results for input(s): CKTOTAL, CKMB, TROPONINI in the last 72 hours. Lab Results  Component Value Date   WBC 11.9 (H) 06/18/2021   HGB 10.5 (L) 06/18/2021   HCT 34.6 (L) 06/18/2021   MCV 84.8 06/18/2021   PLT 175 06/18/2021    Recent Labs  Lab 06/18/21 0539  NA 138  K 4.7  CL 102  CO2 28  BUN 17  CREATININE 0.66  CALCIUM 10.0  PROT 7.2  BILITOT 0.4  ALKPHOS 57  ALT 18  AST 24  GLUCOSE 116*   No results found for: CHOL, HDL, LDLCALC, TRIG No results found for: DDIMER  Radiology/Studies:  CT CHEST WO CONTRAST  Result Date: 06/18/2021 CLINICAL DATA:  61 year old female with shortness of breath, COPD. Progressed reticulonodular opacity in both lungs since last year. Prior suspicion of atypical/mycobacterium infectious process. EXAM: CT CHEST WITHOUT CONTRAST TECHNIQUE: Multidetector CT imaging of the chest was performed following the standard protocol without IV contrast. RADIATION DOSE REDUCTION: This exam was performed according to the departmental  dose-optimization program which includes automated exposure control, adjustment of the mA and/or kV according to patient size and/or use of iterative reconstruction technique. COMPARISON:  Portable chest earlier today.  Chest CT 02/10/2021. FINDINGS: Cardiovascular: Calcified aortic atherosclerosis. Calcified coronary artery atherosclerosis. Mild cardiomegaly. No pericardial effusion. Mediastinum/Nodes: Moderate to large gastric hiatal hernia is stable. Reactive appearing mediastinal lymph nodes are stable. Lungs/Pleura: Major airways are patent. Lung volumes are stable. Widespread small peribronchial nodular opacity last year, with more moderate sized chronic nodules in the left upper lobe associated with chronic bronchial wall thickening. New widespread peribronchial nodular and irregular opacity throughout the right lower lobe and middle lobe now. Slightly increased nodularity also in the left lower lobe, although extensive chronic nodularity there. No pleural effusion. No significant consolidation at this time. Upper Abdomen: Negative visible noncontrast liver, spleen, pancreas, adrenal glands and left kidney. Stable visible bowel in the upper abdomen including ventral abdominal wall diastasis or hernia of the transverse colon. Musculoskeletal: Chronic T11 compression fracture. Chronic mildly angulated sternal fracture. No acute osseous abnormality identified. IMPRESSION: 1. 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 No pleural effusion. 2. Stable moderate to large gastric hiatal hernia. 3. Calcified coronary artery and Aortic Atherosclerosis (ICD10-I70.0). Electronically Signed   By: Genevie Ann M.D.   On: 06/18/2021 11:39   DG Chest Port 1 View  Result Date: 06/18/2021 CLINICAL DATA:  Shortness of breath. Difficulty breathing. History of COPD. EXAM: PORTABLE CHEST 1 VIEW COMPARISON:  03/18/2021  FINDINGS: Stable cardiomediastinal contours. Again noted is diffuse increased reticular and nodular opacities throughout both lungs which appears increased from the previous exam. Within the left upper lobe there is a new 3 cm nodule with central cavitation. Unchanged appearance new perihilar opacity is noted within the right midlung. Within the medial right lung base there is a persistent opacity which may represent an area of atelectasis, scarring or pneumonia. IMPRESSION: 1. Interval progression of chronic lung disease with increased reticular and nodular opacities throughout both lungs. 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 2. New 3 cm left upper lobe nodule with central cavitation. Additionally there is a new perihilar opacity in the right midlung and a persistent opacity within the medial right lung base. Recommend further evaluation with CT of the  chest for more definitive characterization. Electronically Signed   By: Kerby Moors M.D.   On: 06/18/2021 06:09    EKG: Normal sinus rhythm right axis deviation  Weights: Filed Weights   06/18/21 0551  Weight: 58.5 kg     Physical Exam: Blood pressure 97/60, pulse 62, temperature 98.1 F (36.7 C), temperature source Tympanic, resp. rate (!) 23, height 5\' 5"  (1.651 m), weight 58.5 kg, SpO2 97 %. Body mass index is 21.47 kg/m. General: Well developed, well nourished, in no acute distress. Head eyes ears nose throat: Normocephalic, atraumatic, sclera non-icteric, no xanthomas, nares are without discharge. No apparent thyromegaly and/or mass  Lungs: Normal respiratory effort.  Use wheezes, no rales, use honchi.  Heart: RRR with normal S1 S2. no murmur gallop, no rub, PMI is normal size and placement, carotid upstroke normal without bruit, jugular venous pressure is normal Abdomen: Soft, non-tender, non-distended with normoactive bowel sounds. No hepatomegaly. No rebound/guarding.  No obvious abdominal masses. Abdominal aorta is normal size without bruit Extremities: No edema. no cyanosis, no clubbing, no ulcers  Peripheral : 2+ bilateral upper extremity pulses, 2+ bilateral femoral pulses, 2+ bilateral dorsal pedal pulse Neuro: Alert and oriented. No facial asymmetry. No focal deficit. Moves all extremities spontaneously. Musculoskeletal: Normal muscle tone without kyphosis Psych:  Responds to questions appropriately with a normal affect.    Assessment: 61 year old female with chronic obstructive pulmonary disease on chronic oxygen with acute on chronic obstructive infection with exacerbation and atypical chest discomfort consistent with current illness rather than acute coronary syndrome and/or congestive heart failure with minimal elevation of troponin most consistent with demand ischemia rather than acute coronary syndrome  Plan: 1.  Continue supportive care exacerbation of COPD hypoxia and elevated white blood cell count 2.  Consideration of echocardiogram for LV systolic dysfunction valvular heart disease pulmonary hypertension as contributing to above 3.  No further cardiac intervention at this time due to what appears to be demand ischemia rather than acute coronary syndrome 4.  Further treatment options after above  Signed, Corey Skains M.D. [facility] 06/18/2021, 2:46 PM

## 2021-06-18 NOTE — ED Notes (Signed)
Husband arrives at bedside ?

## 2021-06-18 NOTE — Progress Notes (Signed)
Dr. Joylene Igo gave order to discontinue airborne isolation order via secure chat.

## 2021-06-19 ENCOUNTER — Inpatient Hospital Stay
Admit: 2021-06-19 | Discharge: 2021-06-19 | Disposition: A | Discharge status: Home / Self Care | Payer: 59 | Attending: Internal Medicine | Admitting: Internal Medicine

## 2021-06-19 DIAGNOSIS — R778 Other specified abnormalities of plasma proteins: Secondary | ICD-10-CM

## 2021-06-19 DIAGNOSIS — J441 Chronic obstructive pulmonary disease with (acute) exacerbation: Principal | ICD-10-CM

## 2021-06-19 DIAGNOSIS — F418 Other specified anxiety disorders: Secondary | ICD-10-CM

## 2021-06-19 DIAGNOSIS — J9621 Acute and chronic respiratory failure with hypoxia: Secondary | ICD-10-CM

## 2021-06-19 DIAGNOSIS — R911 Solitary pulmonary nodule: Secondary | ICD-10-CM

## 2021-06-19 LAB — ECHOCARDIOGRAM COMPLETE
AR max vel: 2.39 cm2
AV Area VTI: 2.48 cm2
AV Area mean vel: 2.1 cm2
AV Mean grad: 6 mmHg
AV Peak grad: 12.7 mmHg
Ao pk vel: 1.78 m/s
Area-P 1/2: 4.08 cm2
Height: 65 in
MV VTI: 2.93 cm2
S' Lateral: 3.13 cm
Weight: 2064 oz

## 2021-06-19 LAB — CBC
HCT: 32.1 % — ABNORMAL LOW (ref 36.0–46.0)
Hemoglobin: 9.7 g/dL — ABNORMAL LOW (ref 12.0–15.0)
MCH: 25.7 pg — ABNORMAL LOW (ref 26.0–34.0)
MCHC: 30.2 g/dL (ref 30.0–36.0)
MCV: 84.9 fL (ref 80.0–100.0)
Platelets: 172 10*3/uL (ref 150–400)
RBC: 3.78 MIL/uL — ABNORMAL LOW (ref 3.87–5.11)
RDW: 17.6 % — ABNORMAL HIGH (ref 11.5–15.5)
WBC: 11.4 10*3/uL — ABNORMAL HIGH (ref 4.0–10.5)
nRBC: 0 % (ref 0.0–0.2)

## 2021-06-19 LAB — BASIC METABOLIC PANEL
Anion gap: 6 (ref 5–15)
BUN: 23 mg/dL (ref 8–23)
CO2: 29 mmol/L (ref 22–32)
Calcium: 9.7 mg/dL (ref 8.9–10.3)
Chloride: 104 mmol/L (ref 98–111)
Creatinine, Ser: 0.63 mg/dL (ref 0.44–1.00)
GFR, Estimated: >60 mL/min (ref >60)
Glucose, Bld: 93 mg/dL (ref 70–99)
Potassium: 4.6 mmol/L (ref 3.5–5.1)
Sodium: 139 mmol/L (ref 135–145)

## 2021-06-19 LAB — MRSA NEXT GEN BY PCR, NASAL: MRSA by PCR Next Gen: DETECTED — AB

## 2021-06-19 LAB — PROCALCITONIN: Procalcitonin: 0.51 ng/mL

## 2021-06-19 MED ORDER — SODIUM CHLORIDE 0.9 % IV SOLN: INTRAVENOUS | Status: DC | PRN

## 2021-06-19 MED ORDER — DOXYCYCLINE HYCLATE 100 MG PO TABS
100.0000 mg | ORAL_TABLET | Freq: Two times a day (BID) | ORAL | Status: DC
  Administered 2021-06-19 – 2021-06-20 (×2): 100 mg via ORAL
  Filled 2021-06-19 (×2): qty 1

## 2021-06-19 NOTE — Progress Notes (Signed)
[facility] Encounter Note  Patient: Julia [name] / Admit Date: 06/18/2021 / Date of Encounter: 06/19/2021, 4:01 PM   Subjective: Patient is breathing much better feeling much better with less cough congestion and/or hypoxia.  The patient still requires some oxygen supplementation as before but feels improved.  No evidence of chest pain or congestive heart failure symptoms.  Telemetry showing normal sinus rhythm.  Troponin level 37/275/324/389 more consistent with demand ischemia hypoxia rather than acute coronary syndrome.  Echocardiogram showing normal LV systolic function with no evidence of significant valvular heart disease  Review of Systems: Positive for: Shortness of breath Negative for: Vision change, hearing change, syncope, dizziness, nausea, vomiting,diarrhea, bloody stool, stomach pain, cough, congestion, diaphoresis, urinary frequency, urinary pain,skin lesions, skin rashes Others previously listed  Objective: Telemetry: Normal sinus rhythm Physical Exam: Blood pressure (!) 114/42, pulse 77, temperature 98.5 F (36.9 C), resp. rate 16, height 5\' 5"  (1.651 m), weight 58.5 kg, SpO2 93 %. Body mass index is 21.47 kg/m. General: Well developed, well nourished, in no acute distress. Head: Normocephalic, atraumatic, sclera non-icteric, no xanthomas, nares are without discharge. Neck: No apparent masses Lungs: Normal respirations with diffuse wheezes, some rhonchi, no rales , no crackles   Heart: Regular rate and rhythm, normal S1 S2, no murmur, no rub, no gallop, PMI is normal size and placement, carotid upstroke normal without bruit, jugular venous pressure normal Abdomen: Soft, non-tender, non-distended with normoactive bowel sounds. No hepatosplenomegaly. Abdominal aorta is normal size without bruit Extremities: No edema, no clubbing, no cyanosis, no ulcers,  Peripheral: 2+ radial, 2+ femoral, 2+ dorsal pedal pulses Neuro: Alert and oriented. Moves all  extremities spontaneously. Psych:  Responds to questions appropriately with a normal affect.   Intake/Output Summary (Last 24 hours) at 06/19/2021 1601 Last data filed at 06/19/2021 1419 Gross per 24 hour  Intake 808.92 ml  Output --  Net 808.92 ml    Inpatient Medications:   enoxaparin (LOVENOX) injection  40 mg Subcutaneous Q24H   ferrous sulfate  325 mg Oral BID WC   umeclidinium bromide  1 puff Inhalation Daily   And   fluticasone furoate-vilanterol  1 puff Inhalation Daily   gabapentin  300 mg Oral TID   guaiFENesin  1,200 mg Oral BID   ipratropium-albuterol  3 mL Nebulization Q6H   magnesium oxide  400 mg Oral Daily   mouth rinse  15 mL Mouth Rinse BID   pantoprazole  40 mg Oral Daily   predniSONE  40 mg Oral Q breakfast   sertraline  150 mg Oral Daily   Infusions:   sodium chloride Stopped (06/19/21 0644)   azithromycin 250 mL/hr at 06/19/21 0654   cefTRIAXone (ROCEPHIN)  IV Stopped (06/19/21 0639)    Labs: Recent Labs    06/18/21 0539 06/19/21 0523  NA 138 139  K 4.7 4.6  CL 102 104  CO2 28 29  GLUCOSE 116* 93  BUN 17 23  CREATININE 0.66 0.63  CALCIUM 10.0 9.7  MG 1.6*  --    Recent Labs    06/18/21 0539  AST 24  ALT 18  ALKPHOS 57  BILITOT 0.4  PROT 7.2  ALBUMIN 3.8   Recent Labs    06/18/21 0539 06/19/21 0523  WBC 11.9* 11.4*  NEUTROABS 10.2*  --   HGB 10.5* 9.7*  HCT 34.6* 32.1*  MCV 84.8 84.9  PLT 175 172   No results for input(s): CKTOTAL, CKMB, TROPONINI in the last 72 hours. Invalid input(s):  POCBNP No results for input(s): HGBA1C in the last 72 hours.   Weights: Filed Weights   06/18/21 0551  Weight: 58.5 kg     Radiology/Studies:  CT CHEST WO CONTRAST  Result Date: 06/18/2021 CLINICAL DATA:  61 year old female with shortness of breath, COPD. Progressed reticulonodular opacity in both lungs since last year. Prior suspicion of atypical/mycobacterium infectious process. EXAM: CT CHEST WITHOUT CONTRAST  TECHNIQUE: Multidetector CT imaging of the chest was performed following the standard protocol without IV contrast. RADIATION DOSE REDUCTION: This exam was performed according to the departmental dose-optimization program which includes automated exposure control, adjustment of the mA and/or kV according to patient size and/or use of iterative reconstruction technique. COMPARISON:  Portable chest earlier today.  Chest CT 02/10/2021. FINDINGS: Cardiovascular: Calcified aortic atherosclerosis. Calcified coronary artery atherosclerosis. Mild cardiomegaly. No pericardial effusion. Mediastinum/Nodes: Moderate to large gastric hiatal hernia is stable. Reactive appearing mediastinal lymph nodes are stable. Lungs/Pleura: Major airways are patent. Lung volumes are stable. Widespread small peribronchial nodular opacity last year, with more moderate sized chronic nodules in the left upper lobe associated with chronic bronchial wall thickening. New widespread peribronchial nodular and irregular opacity throughout the right lower lobe and middle lobe now. Slightly increased nodularity also in the left lower lobe, although extensive chronic nodularity there. No pleural effusion. No significant consolidation at this time. Upper Abdomen: Negative visible noncontrast liver, spleen, pancreas, adrenal glands and left kidney. Stable visible bowel in the upper abdomen including ventral abdominal wall diastasis or hernia of the transverse colon. Musculoskeletal: Chronic T11 compression fracture. Chronic mildly angulated sternal fracture. No acute osseous abnormality identified. IMPRESSION: 1. 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 No pleural effusion. 2. Stable moderate to large gastric hiatal hernia. 3. Calcified coronary artery and Aortic Atherosclerosis (ICD10-I70.0). Electronically Signed   By: Genevie Ann M.D.   On:  06/18/2021 11:39   DG Chest Port 1 View  Result Date: 06/18/2021 CLINICAL DATA:  Shortness of breath. Difficulty breathing. History of COPD. EXAM: PORTABLE CHEST 1 VIEW COMPARISON:  03/18/2021 FINDINGS: Stable cardiomediastinal contours. Again noted is diffuse increased reticular and nodular opacities throughout both lungs which appears increased from the previous exam. Within the left upper lobe there is a new 3 cm nodule with central cavitation. Unchanged appearance new perihilar opacity is noted within the right midlung. Within the medial right lung base there is a persistent opacity which may represent an area of atelectasis, scarring or pneumonia. IMPRESSION: 1. Interval progression of chronic lung disease with increased reticular and nodular opacities throughout both lungs. 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 2. New 3 cm left upper lobe nodule with central cavitation. Additionally there is a new perihilar opacity in the right midlung and a persistent opacity within the medial right lung base. Recommend further evaluation with CT of the chest for more definitive characterization. Electronically Signed   By: Kerby Moors M.D.   On: 06/18/2021 06:09     Assessment and Recommendation  61 y.o. female with known coronary artery disease by calcifications by CT with abnormal EKG and acute exacerbation of COPD with elevated troponin more consistent with demand ischemia rather than acute coronary syndrome and no evidence of congestive heart failure 1.  Continuation of supportive care of acute chronic obstructive pulmonary disease exacerbation and hypoxia 2.  No further cardiac diagnostics necessary at this time due to no evidence of acute  coronary syndrome or congestive heart failure 3.  Begin physical rehabilitation and follow-up for improvements of above 4.  Call if further questions  Signed, Serafina Royals M.D. FACC

## 2021-06-19 NOTE — Consult Note (Signed)
PULMONOLOGY         Date: 06/19/2021,   MRN# [id number] Julia [name] [birth year]     AdmissionWeight: 58.5 kg                 CurrentWeight: 58.5 kg   Referring physician: Dr Manuella Ghazi   CHIEF COMPLAINT:   Right lower lobe infiltrate    HISTORY OF PRESENT ILLNESS   61 yo F with hx of COPD chronic hypoxemia, MDD, nicotine use ventral hernia who presents to ER for sob. She was found to have acute on chronic hypoxemia and required 10L of oxygen supplementally.  She received solumedrol and BIPAP therapy on arrival with improvement. She also had CT chest which I reviewed and there are multifocal infiltrates.  She is ok during interview and states she can provide resp cultures to [place] for microbiology.    PAST MEDICAL HISTORY   Past Medical History:  Diagnosis Date   Anemia    Anxiety    Asthma    COPD (chronic obstructive pulmonary disease) (HCC)    Depression    Ventral hernia      SURGICAL HISTORY   Past Surgical History:  Procedure Laterality Date   COLON SURGERY     EAR TUBE REMOVAL     FRACTURE SURGERY     HERNIA REPAIR     TUBAL LIGATION       FAMILY HISTORY   Family History  Problem Relation Age of Onset   Sarcoidosis Mother    Alcohol abuse Father      SOCIAL HISTORY   Social History   Tobacco Use   Smoking status: Former    Packs/day: 0.50    Types: Cigarettes    Quit date: 04/25/2020    Years since quitting: 1.1   Smokeless tobacco: Never  Substance Use Topics   Alcohol use: No   Drug use: No     MEDICATIONS    Home Medication:    Current Medication:  Current Facility-Administered Medications:    0.9 %  sodium chloride infusion, , Intravenous, PRN, Sharion Settler, NP, Stopped at 06/19/21 [phone]   acetaminophen (TYLENOL) tablet 650 mg, 650 mg, Oral, Q6H PRN **OR** acetaminophen (TYLENOL) suppository 650 mg, 650 mg, Rectal, Q6H PRN, Agbata, Tochukwu, MD   albuterol (PROVENTIL) (2.5 MG/3ML) 0.083%  nebulizer solution 2.5 mg, 2.5 mg, Nebulization, Q2H PRN, Agbata, Tochukwu, MD   ALPRAZolam (XANAX) tablet 0.25-0.5 mg, 0.25-0.5 mg, Oral, BID PRN, Agbata, Tochukwu, MD   azithromycin (ZITHROMAX) 500 mg in sodium chloride 0.9 % 250 mL IVPB, 500 mg, Intravenous, Q24H, Agbata, Tochukwu, MD, Last Rate: 250 mL/hr at 06/19/21 0654, Infusion Verify at 06/19/21 0654   cefTRIAXone (ROCEPHIN) 2 g in sodium chloride 0.9 % 100 mL IVPB, 2 g, Intravenous, Q24H, Agbata, Tochukwu, MD, Stopped at 06/19/21 0639   docusate sodium (COLACE) capsule 100 mg, 100 mg, Oral, Daily PRN, Agbata, Tochukwu, MD   enoxaparin (LOVENOX) injection 40 mg, 40 mg, Subcutaneous, Q24H, Agbata, Tochukwu, MD, 40 mg at 06/18/21 2114   ferrous sulfate tablet 325 mg, 325 mg, Oral, BID WC, Agbata, Tochukwu, MD, 325 mg at 06/19/21 0816   umeclidinium bromide (INCRUSE ELLIPTA) 62.5 MCG/ACT 1 puff, 1 puff, Inhalation, Daily **AND** fluticasone furoate-vilanterol (BREO ELLIPTA) 100-25 MCG/ACT 1 puff, 1 puff, Inhalation, Daily, Agbata, Tochukwu, MD   gabapentin (NEURONTIN) capsule 300 mg, 300 mg, Oral, TID, Agbata, Tochukwu, MD, 300 mg at 06/19/21 0816   guaiFENesin (MUCINEX) 12 hr tablet 1,200 mg, 1,200 mg, Oral,  BID, Agbata, Tochukwu, MD, 1,200 mg at 06/19/21 0816   ipratropium-albuterol (DUONEB) 0.5-2.5 (3) MG/3ML nebulizer solution 3 mL, 3 mL, Nebulization, Q6H, Agbata, Tochukwu, MD, 3 mL at 06/19/21 0743   magnesium oxide (MAG-OX) tablet 400 mg, 400 mg, Oral, Daily, Agbata, Tochukwu, MD, 400 mg at 06/19/21 0816   MEDLINE mouth rinse, 15 mL, Mouth Rinse, BID, Agbata, Tochukwu, MD, 15 mL at 06/19/21 0818   ondansetron (ZOFRAN) tablet 4 mg, 4 mg, Oral, Q6H PRN **OR** ondansetron (ZOFRAN) injection 4 mg, 4 mg, Intravenous, Q6H PRN, Agbata, Tochukwu, MD   pantoprazole (PROTONIX) EC tablet 40 mg, 40 mg, Oral, Daily, Agbata, Tochukwu, MD, 40 mg at 06/19/21 0816   [COMPLETED] methylPREDNISolone sodium succinate (SOLU-MEDROL) 40 mg/mL  injection 40 mg, 40 mg, Intravenous, Q12H, 40 mg at 06/19/21 0557 **FOLLOWED BY** predniSONE (DELTASONE) tablet 40 mg, 40 mg, Oral, Q breakfast, Agbata, Tochukwu, MD   sertraline (ZOLOFT) tablet 150 mg, 150 mg, Oral, Daily, Agbata, Tochukwu, MD, 150 mg at 06/19/21 0816    ALLERGIES   Banana     REVIEW OF SYSTEMS    Review of Systems:  Gen:  Denies  fever, sweats, chills weigh loss  HEENT: Denies blurred vision, double vision, ear pain, eye pain, hearing loss, nose bleeds, sore throat Cardiac:  No dizziness, chest pain or heaviness, chest tightness,edema Resp:   Denies cough or sputum porduction, shortness of breath,wheezing, hemoptysis,  Gi: Denies swallowing difficulty, stomach pain, nausea or vomiting, diarrhea, constipation, bowel incontinence Gu:  Denies bladder incontinence, burning urine Ext:   Denies Joint pain, stiffness or swelling Skin: Denies  skin rash, easy bruising or bleeding or hives Endoc:  Denies polyuria, polydipsia , polyphagia or weight change Psych:   Denies depression, insomnia or hallucinations   Other:  All other systems negative   VS: BP (!) 96/58 (BP Location: Right Arm)    Pulse (!) 55    Temp 97.8 F (36.6 C)    Resp 16    Ht _0  (1.651 m)    Wt 58.5 kg    SpO2 97%    BMI 21.47 kg/m      PHYSICAL EXAM    GENERAL:NAD, no fevers, chills, no weakness no fatigue HEAD: Normocephalic, atraumatic.  EYES: Pupils equal, round, reactive to light. Extraocular muscles intact. No scleral icterus.  MOUTH: Moist mucosal membrane. Dentition intact. No abscess noted.  EAR, NOSE, THROAT: Clear without exudates. No external lesions.  NECK: Supple. No thyromegaly. No nodules. No JVD.  PULMONARY: Diffuse coarse rhonchi right sided +wheezes CARDIOVASCULAR: S1 and S2. Regular rate and rhythm. No murmurs, rubs, or gallops. No edema. Pedal pulses 2+ bilaterally.  GASTROINTESTINAL: Soft, nontender, nondistended. No masses. Positive bowel sounds. No  hepatosplenomegaly.  MUSCULOSKELETAL: No swelling, clubbing, or edema. Range of motion full in all extremities.  NEUROLOGIC: Cranial nerves II through XII are intact. No gross focal neurological deficits. Sensation intact. Reflexes intact.  SKIN: No ulceration, lesions, rashes, or cyanosis. Skin warm and dry. Turgor intact.  PSYCHIATRIC: Mood, affect within normal limits. The patient is awake, alert and oriented x 3. Insight, judgment intact.       IMAGING    CT CHEST WO CONTRAST  Result Date: 06/18/2021 CLINICAL DATA:  61 year old female with shortness of breath, COPD. Progressed reticulonodular opacity in both lungs since last year. Prior suspicion of atypical/mycobacterium infectious process. EXAM: CT CHEST WITHOUT CONTRAST TECHNIQUE: Multidetector CT imaging of the chest was performed following the standard protocol without IV contrast. RADIATION DOSE REDUCTION: This exam  was performed according to the departmental dose-optimization program which includes automated exposure control, adjustment of the mA and/or kV according to patient size and/or use of iterative reconstruction technique. COMPARISON:  Portable chest earlier today.  Chest CT 02/10/2021. FINDINGS: Cardiovascular: Calcified aortic atherosclerosis. Calcified coronary artery atherosclerosis. Mild cardiomegaly. No pericardial effusion. Mediastinum/Nodes: Moderate to large gastric hiatal hernia is stable. Reactive appearing mediastinal lymph nodes are stable. Lungs/Pleura: Major airways are patent. Lung volumes are stable. Widespread small peribronchial nodular opacity last year, with more moderate sized chronic nodules in the left upper lobe associated with chronic bronchial wall thickening. New widespread peribronchial nodular and irregular opacity throughout the right lower lobe and middle lobe now. Slightly increased nodularity also in the left lower lobe, although extensive chronic nodularity there. No pleural effusion. No significant  consolidation at this time. Upper Abdomen: Negative visible noncontrast liver, spleen, pancreas, adrenal glands and left kidney. Stable visible bowel in the upper abdomen including ventral abdominal wall diastasis or hernia of the transverse colon. Musculoskeletal: Chronic T11 compression fracture. Chronic mildly angulated sternal fracture. No acute osseous abnormality identified. IMPRESSION: 1. 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 No pleural effusion. 2. Stable moderate to large gastric hiatal hernia. 3. Calcified coronary artery and Aortic Atherosclerosis (ICD10-I70.0). Electronically Signed   By: Genevie Ann M.D.   On: 06/18/2021 11:39   DG Chest Port 1 View  Result Date: 06/18/2021 CLINICAL DATA:  Shortness of breath. Difficulty breathing. History of COPD. EXAM: PORTABLE CHEST 1 VIEW COMPARISON:  03/18/2021 FINDINGS: Stable cardiomediastinal contours. Again noted is diffuse increased reticular and nodular opacities throughout both lungs which appears increased from the previous exam. Within the left upper lobe there is a new 3 cm nodule with central cavitation. Unchanged appearance new perihilar opacity is noted within the right midlung. Within the medial right lung base there is a persistent opacity which may represent an area of atelectasis, scarring or pneumonia. IMPRESSION: 1. Interval progression of chronic lung disease with increased reticular and nodular opacities throughout both lungs. 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 2. New 3 cm left upper lobe nodule with central cavitation. Additionally there is a new perihilar opacity in the right midlung and a persistent opacity within the medial right lung base. Recommend further evaluation with CT of the chest for more definitive characterization. Electronically Signed   By:  Kerby Moors M.D.   On: 06/18/2021 06:09      ASSESSMENT/PLAN   Acute on chronic hypoxemic respiratory failuire - present on admission  - COVID19 negative  - supplemental O2 during my evaluation 3L/min - ID on case appreciate input - will consider bronch -Respiratory viral panel -serum fungitell -legionella ab -strep pneumoniae ur AG -Histoplasma Ur Ag -sputum resp cultures -AFB sputum expectorated specimen -reviewed pertinent imaging with patient today - ESR -MRSA PCR -PRocalcitonin -PT/OT for d/c planning  -please encourage patient to use incentive spirometer few times each hour while hospitalized.    Acute exacerbation of COPD -continue COPD carepath per Dr Manuella Ghazi - agree with management     Thank you for allowing me to participate in the care of this patient.   Patient/Family are satisfied with care plan and all questions have been answered.  This document was prepared using Dragon voice recognition software and may include unintentional dictation errors.     Ottie Glazier, M.D.  Division of Pulmonary & Critical Care  [place]

## 2021-06-19 NOTE — Assessment & Plan Note (Signed)
Initially required BiPAP but now weaned off to room air.  At baseline she wears 2 L oxygen at night

## 2021-06-19 NOTE — Assessment & Plan Note (Signed)
Continue Breo Ellipta, Mucinex, albuterol nebulizer as needed, prednisone.  Pulmonary consult Checking respiratory virus panel, serum Fungitell, Legionella antibody, strep pneumo, histoplasma, sputum, procalcitonin, MRSA PCR, ESR per pulmonary Pulmonary to consider bronc

## 2021-06-19 NOTE — Hospital Course (Signed)
61 y.o. female with medical history significant for COPD with chronic respiratory failure on 2 L of oxygen at night, depression, nicotine dependence, ventral hernia admitted for acute on chronic respiratory failure likely due to COPD exacerbation  2/17: Seen by pulmonary, ID, cardiology.  Further infectious and pulmonary work-up in progress including bronc

## 2021-06-19 NOTE — Progress Notes (Signed)
*  PRELIMINARY RESULTS* Echocardiogram 2D Echocardiogram has been performed.  Julia [name] Julia [name] 06/19/2021, 9:45 AM

## 2021-06-19 NOTE — Progress Notes (Signed)
Progress Note   Patient: Julia [name] [id number]:[id number] DOB: Sep 26, 1960 DOA: 06/18/2021     1 DOS: the patient was seen and examined on 06/19/2021   Brief hospital course: 61 y.o. female with medical history significant for COPD with chronic respiratory failure on 2 L of oxygen at night, depression, nicotine dependence, ventral hernia admitted for acute on chronic respiratory failure likely due to COPD exacerbation  2/17: Seen by pulmonary, ID, cardiology.  Further infectious and pulmonary work-up in progress including bronc   Assessment and Plan: * COPD with acute exacerbation (Julia [name])- (present on admission) Continue Breo Ellipta, Mucinex, albuterol nebulizer as needed, prednisone.  Pulmonary consult Checking respiratory virus panel, serum Fungitell, Legionella antibody, strep pneumo, histoplasma, sputum, procalcitonin, MRSA PCR, ESR per pulmonary Pulmonary to consider bronc  Depression with anxiety- (present on admission) Continue Xanax as needed and Zoloft  Lung nodule- (present on admission) Appreciate ID and pulmonary input.  She may need bronch to evaluate for MAC or atypical mycobacteria  Acute on chronic respiratory failure with hypoxia (Julia [name])- (present on admission) Initially required BiPAP but now weaned off to room air.  At baseline she wears 2 L oxygen at night        Subjective: Feeling better but still short of breath.  Husband at bedside.  Physical Exam: Vitals:   06/19/21 0334 06/19/21 0745 06/19/21 1351 06/19/21 1539  BP: (!) 96/58   (!) 114/42  Pulse: (!) 55   77  Resp: 16   16  Temp: 97.8 F (36.6 C)   98.5 F (36.9 C)  TempSrc:      SpO2: 90% 97% 96% 93%  Weight:      Height:       61 year old female lying in the bed comfortably without any acute distress Eyes pupil equal round reactive to light accommodation, no scleral icterus Lungs mild expiratory wheezing, no rales or rhonchi Cardiovascular S1, S2 Normal, no murmur rubs or gallop Abdomen soft,  benign Neuro alert and oriented, nonfocal Skin no rash or lesion  Data Reviewed:  Hemoglobin 9.7  Family Communication: Husband updated at bedside  Disposition: Status is: Inpatient Remains inpatient appropriate because: Getting further pulmonary and ID work-up       DVT prophylaxis-subcu Lovenox    Planned Discharge Destination: Home     Time spent: 35 minutes  Author: Max Sane, MD 06/19/2021 3:54 PM  For on call review [email or web address].

## 2021-06-19 NOTE — Assessment & Plan Note (Signed)
Appreciate ID and pulmonary input.  She may need bronch to evaluate for MAC or atypical mycobacteria

## 2021-06-19 NOTE — Progress Notes (Signed)
°  Transition of Care [facility]) Screening Note   Patient Details  Name: TIKA HANNIS Date of Birth: 03/14/1961   Transition of [facility]) CM/SW Contact:    Gildardo Griffes, LCSW Phone Number: 06/19/2021, 9:26 AM  CSW notes patient from home with husband, ears 3L O2 at baseline at night, PCP Ignacia Felling NP.  Transition of Care Department [facility]) has reviewed patient and no TOC needs have been identified at this time. We will continue to monitor patient advancement through interdisciplinary progression rounds. If new patient transition needs arise, please place a TOC consult.  [place], [place] [phone]

## 2021-06-19 NOTE — Assessment & Plan Note (Signed)
Continue Xanax as needed and Zoloft

## 2021-06-19 NOTE — Consult Note (Signed)
NAMEKATIANNE [name]  DOB: 1960-06-23  MRN: [id number]  Date/Time: 06/19/2021 9:30 PM  REQUESTING PROVIDER: Dr. Francine Graven Subjective:  REASON FOR CONSULT: Cavitary lesion  ? Julia [name] is a 61 y.o. female with a history of COPD, current smoker GERD, ventral hernia, followed at [facility] pulmonology presents with worsening shortness of breath of 2 days duration. Patient states that her neighbor was burning wood and that fumes bother her breathing and she started to have increasing shortness of breath. So she called EMS and her pulse ox was 81%.  She did not have any fever.  She had severe chest tightness and nonproductive cough In the ED BP 120/62, temperature 97.7, pulse 51, respiratory 16, sats 97%. WBC 11.9, Hb 10.5, platelet 175 and creatinine 0.66 Chest x-ray showed interval progression of chronic lung disease with increased reticular nodular opacities throughout both lungs.  Atypical mycobacterial infectious process was favored. New 3 cm left upper lobe nodule with central cavitation.  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  Stable moderate to large gastric hiatal hernia.   Past Medical History:  Diagnosis Date   Anemia    Anxiety    Asthma    COPD (chronic obstructive pulmonary disease) ([place])    Depression    Ventral hernia     Past Surgical History:  Procedure Laterality Date   COLON SURGERY     EAR TUBE REMOVAL     FRACTURE SURGERY     HERNIA REPAIR     TUBAL LIGATION      Social History   Socioeconomic History   Marital status: Married    Spouse name: Not on file   Number of children: Not on file   Years of education: Not on file   Highest education level: Not on file  Occupational History   Not on file  Tobacco Use   Smoking status: Former    Packs/day: 0.50    Types: Cigarettes    Quit date: 04/25/2020    Years since quitting:  1.1   Smokeless tobacco: Never  Substance and Sexual Activity   Alcohol use: No   Drug use: No   Sexual activity: Not on file  Other Topics Concern   Not on file  Social History Narrative   Not on file   Social Determinants of Health   Financial Resource Strain: Not on file  Food Insecurity: Not on file  Transportation Needs: Not on file  Physical Activity: Not on file  Stress: Not on file  Social Connections: Not on file  Intimate Partner Violence: Not on file    Family History  Problem Relation Age of Onset   Sarcoidosis Mother    Alcohol abuse Father    Allergies  Allergen Reactions   Banana Nausea And Vomiting   I? Current Facility-Administered Medications  Medication Dose Route Frequency Provider Last Rate Last Admin   0.9 %  sodium chloride infusion   Intravenous PRN Sharion Settler, NP   Stopped at 06/19/21 3716   acetaminophen (TYLENOL) tablet 650 mg  650 mg Oral Q6H PRN Agbata, Tochukwu, MD       Or   acetaminophen (TYLENOL) suppository 650 mg  650 mg Rectal Q6H PRN Agbata, Tochukwu, MD       albuterol (PROVENTIL) (2.5 MG/3ML) 0.083% nebulizer solution 2.5 mg  2.5 mg Nebulization Q2H PRN Agbata, Tochukwu, MD       ALPRAZolam (  XANAX) tablet 0.25-0.5 mg  0.25-0.5 mg Oral BID PRN Agbata, Tochukwu, MD   0.5 mg at 06/19/21 2126   docusate sodium (COLACE) capsule 100 mg  100 mg Oral Daily PRN Agbata, Tochukwu, MD       doxycycline (VIBRA-TABS) tablet 100 mg  100 mg Oral Q12H Aleskerov, Fuad, MD   100 mg at 06/19/21 2126   enoxaparin (LOVENOX) injection 40 mg  40 mg Subcutaneous Q24H Agbata, Tochukwu, MD   40 mg at 06/19/21 2126   ferrous sulfate tablet 325 mg  325 mg Oral BID WC Agbata, Tochukwu, MD   325 mg at 06/19/21 1652   umeclidinium bromide (INCRUSE ELLIPTA) 62.5 MCG/ACT 1 puff  1 puff Inhalation Daily Agbata, Tochukwu, MD   1 puff at 06/19/21 1224   And   fluticasone furoate-vilanterol (BREO ELLIPTA) 100-25 MCG/ACT 1 puff  1 puff Inhalation Daily Agbata,  Tochukwu, MD   1 puff at 06/19/21 1224   gabapentin (NEURONTIN) capsule 300 mg  300 mg Oral TID Agbata, Tochukwu, MD   300 mg at 06/19/21 2126   guaiFENesin (MUCINEX) 12 hr tablet 1,200 mg  1,200 mg Oral BID Agbata, Tochukwu, MD   1,200 mg at 06/19/21 2126   ipratropium-albuterol (DUONEB) 0.5-2.5 (3) MG/3ML nebulizer solution 3 mL  3 mL Nebulization Q6H Agbata, Tochukwu, MD   3 mL at 06/19/21 2001   magnesium oxide (MAG-OX) tablet 400 mg  400 mg Oral Daily Agbata, Tochukwu, MD   400 mg at 06/19/21 0816   MEDLINE mouth rinse  15 mL Mouth Rinse BID Agbata, Tochukwu, MD   15 mL at 06/19/21 2127   ondansetron (ZOFRAN) tablet 4 mg  4 mg Oral Q6H PRN Agbata, Tochukwu, MD       Or   ondansetron (ZOFRAN) injection 4 mg  4 mg Intravenous Q6H PRN Agbata, Tochukwu, MD       pantoprazole (PROTONIX) EC tablet 40 mg  40 mg Oral Daily Agbata, Tochukwu, MD   40 mg at 06/19/21 0816   predniSONE (DELTASONE) tablet 40 mg  40 mg Oral Q breakfast Agbata, Tochukwu, MD   40 mg at 06/19/21 1652   sertraline (ZOLOFT) tablet 150 mg  150 mg Oral Daily Agbata, Tochukwu, MD   150 mg at 06/19/21 0816     Abtx:  Anti-infectives (From admission, onward)    Start     Dose/Rate Route Frequency Ordered Stop   06/19/21 2200  doxycycline (VIBRA-TABS) tablet 100 mg        100 mg Oral Every 12 hours 06/19/21 1842     06/19/21 0700  azithromycin (ZITHROMAX) 500 mg in sodium chloride 0.9 % 250 mL IVPB  Status:  Discontinued        500 mg 250 mL/hr over 60 Minutes Intravenous Every 24 hours 06/18/21 1234 06/19/21 1842   06/19/21 0630  cefTRIAXone (ROCEPHIN) 2 g in sodium chloride 0.9 % 100 mL IVPB  Status:  Discontinued        2 g 200 mL/hr over 30 Minutes Intravenous Every 24 hours 06/18/21 1234 06/19/21 1842   06/18/21 0615  cefTRIAXone (ROCEPHIN) 1 g in sodium chloride 0.9 % 100 mL IVPB        1 g 200 mL/hr over 30 Minutes Intravenous  Once 06/18/21 0612 06/18/21 0649   06/18/21 0615  azithromycin (ZITHROMAX) 500 mg in sodium  chloride 0.9 % 250 mL IVPB        500 mg 250 mL/hr over 60 Minutes Intravenous  Once 06/18/21 0612  06/18/21 0935       REVIEW OF SYSTEMS:  Const: Subjective fever, negative chills, negative weight loss Eyes: negative diplopia or visual changes, negative eye pain ENT: negative coryza, negative sore throat Resp: Positive cough, no hemoptysis, positive dyspnea Cards: Positive chest pain, palpitations, no lower extremity edema GU: negative for frequency, dysuria and hematuria GI: Negative for abdominal pain, diarrhea, bleeding, constipation Skin: negative for rash and pruritus Heme: negative for easy bruising and gum/nose bleeding MS: General weakness Neurolo:negative for headaches, dizziness, vertigo, memory problems  Psych: Anxiety endocrine: negative for thyroid, diabetes Allergy/Immunology-Food allergy Pertinent Positives include : Objective:  VITALS:  BP 139/60 (BP Location: Right Arm)    Pulse 77    Temp 98 F (36.7 C) (Oral)    Resp 16    Ht _0  (1.651 m)    Wt 58.5 kg    SpO2 94%    BMI 21.47 kg/m  PHYSICAL EXAM:  General: Alert, cooperative, distress, appears stated age.  Head: Normocephalic, without obvious abnormality, atraumatic. Eyes: Conjunctivae clear, anicteric sclerae. Pupils are equal ENT Nares normal. No drainage or sinus tenderness. Lips, mucosa, and tongue normal. No Thrush Neck: Supple, symmetrical, no adenopathy, thyroid: non tender no carotid bruit and no JVD. Back: No CVA tenderness. Lungs: Bilateral air entry Crepitation in the bases Rhonchi in bases. Heart: Regular rate and rhythm, no murmur, rub or gallop. Abdomen: Soft, hernia Bowel sounds normal. No masses Extremities: atraumatic, no cyanosis. No edema. No clubbing Skin: No rashes or lesions. Or bruising Lymph: Cervical, supraclavicular normal. Neurologic: Grossly non-focal Pertinent Labs Lab Results CBC    Component Value Date/Time   WBC 11.4 (H) 06/19/2021 0523   RBC 3.78 (L) 06/19/2021  0523   HGB 9.7 (L) 06/19/2021 0523   HGB 8.3 (L) 04/02/2014 0647   HCT 32.1 (L) 06/19/2021 0523   HCT 26.7 (L) 04/02/2014 0647   PLT 172 06/19/2021 0523   PLT 243 04/02/2014 0647   MCV 84.9 06/19/2021 0523   MCV 79 (L) 04/02/2014 0647   MCH 25.7 (L) 06/19/2021 0523   MCHC 30.2 06/19/2021 0523   RDW 17.6 (H) 06/19/2021 0523   RDW 21.4 (H) 04/02/2014 0647   LYMPHSABS 0.8 06/18/2021 0539   LYMPHSABS 0.9 (L) 04/02/2014 0647   MONOABS 0.8 06/18/2021 0539   MONOABS 0.4 04/02/2014 0647   EOSABS 0.0 06/18/2021 0539   EOSABS 0.0 04/02/2014 0647   BASOSABS 0.0 06/18/2021 0539   BASOSABS 0.0 04/02/2014 0647    CMP Latest Ref Rng & Units 06/19/2021 06/18/2021 03/18/2021  Glucose 70 - 99 mg/dL 93 116(H) 126(H)  BUN 8 - 23 mg/dL _1 Creatinine 0.44 - 1.00 mg/dL 0.63 0.66 0.78  Sodium 135 - 145 mmol/L 139 138 139  Potassium 3.5 - 5.1 mmol/L 4.6 4.7 4.3  Chloride 98 - 111 mmol/L 104 102 103  CO2 22 - 32 mmol/L _2 Calcium 8.9 - 10.3 mg/dL 9.7 10.0 9.9  Total Protein 6.5 - 8.1 g/dL - 7.2 7.5  Total Bilirubin 0.3 - 1.2 mg/dL - 0.4 0.2(L)  Alkaline Phos 38 - 126 U/L - 57 56  AST 15 - 41 U/L - 24 22  ALT 0 - 44 U/L - 18 14      Microbiology: Recent Results (from the past 240 hour(s))  Culture, blood (routine x 2)     Status: None (Preliminary result)   Collection Time: 06/18/21  5:39 AM   Specimen: BLOOD  Result Value Ref Range Status  Specimen Description BLOOD LEFT AC  Final   Special Requests   Final    BOTTLES DRAWN AEROBIC AND ANAEROBIC Blood Culture adequate volume   Culture   Final    NO GROWTH < 24 HOURS Performed at [facility], [address] [place], [place] [address]    Report Status PENDING  Incomplete  Resp Panel by RT-PCR (Flu A&B, Covid) Nasopharyngeal Swab     Status: None   Collection Time: 06/18/21  5:39 AM   Specimen: Nasopharyngeal Swab; Nasopharyngeal(NP) swabs in vial transport medium  Result Value Ref Range Status   SARS  Coronavirus 2 by RT PCR NEGATIVE NEGATIVE Final    Comment: (NOTE) SARS-CoV-2 target nucleic acids are NOT DETECTED.  The SARS-CoV-2 RNA is generally detectable in upper respiratory specimens during the acute phase of infection. The lowest concentration of SARS-CoV-2 viral copies this assay can detect is 138 copies/mL. A negative result does not preclude SARS-Cov-2 infection and should not be used as the sole basis for treatment or other patient management decisions. A negative result may occur with  improper specimen collection/handling, submission of specimen other than nasopharyngeal swab, presence of viral mutation(s) within the areas targeted by this assay, and inadequate number of viral copies(<138 copies/mL). A negative result must be combined with clinical observations, patient history, and epidemiological information. The expected result is Negative.  Fact Sheet for Patients:  EntrepreneurPulse.com.au  Fact Sheet for Healthcare Providers:  IncredibleEmployment.be  This test is no t yet approved or cleared by the [place] FDA and  has been authorized for detection and/or diagnosis of SARS-CoV-2 by FDA under an Emergency Use Authorization (EUA). This EUA will remain  in effect (meaning this test can be used) for the duration of the COVID-19 declaration under Section 564(b)(1) of the Act, 21 U.S.C.section 360bbb-3(b)(1), unless the authorization is terminated  or revoked sooner.       Influenza A by PCR NEGATIVE NEGATIVE Final   Influenza B by PCR NEGATIVE NEGATIVE Final    Comment: (NOTE) The Xpert Xpress SARS-CoV-2/FLU/RSV plus assay is intended as an aid in the diagnosis of influenza from Nasopharyngeal swab specimens and should not be used as a sole basis for treatment. Nasal washings and aspirates are unacceptable for Xpert Xpress SARS-CoV-2/FLU/RSV testing.  Fact Sheet for  Patients: EntrepreneurPulse.com.au  Fact Sheet for Healthcare Providers: IncredibleEmployment.be  This test is not yet approved or cleared by the [place] FDA and has been authorized for detection and/or diagnosis of SARS-CoV-2 by FDA under an Emergency Use Authorization (EUA). This EUA will remain in effect (meaning this test can be used) for the duration of the COVID-19 declaration under Section 564(b)(1) of the Act, 21 U.S.C. section 360bbb-3(b)(1), unless the authorization is terminated or revoked.  Performed at [facility], [address] [place], [place] [address]   Culture, blood (routine x 2)     Status: None (Preliminary result)   Collection Time: 06/18/21  5:40 AM   Specimen: BLOOD  Result Value Ref Range Status   Specimen Description BLOOD RIGHT [facility]  Final   Special Requests   Final    BOTTLES DRAWN AEROBIC AND ANAEROBIC Blood Culture results may not be optimal due to an excessive volume of blood received in culture bottles   Culture   Final    NO GROWTH < 24 HOURS Performed at [facility], [address] [place], [place] [address]    Report Status PENDING  Incomplete  MRSA Next Gen by PCR,  Nasal     Status: Abnormal   Collection Time: 06/19/21 12:27 PM   Specimen: Nasal Mucosa; Nasal Swab  Result Value Ref Range Status   MRSA by PCR Next Gen DETECTED (A) NOT DETECTED Final    Comment: RESULT CALLED TO, READ BACK BY AND VERIFIED WITH: BRITTANY BALLARD AT 1841 ON 06/19/21 BY SS (NOTE) The GeneXpert MRSA Assay (FDA approved for NASAL specimens only), is one component of a comprehensive MRSA colonization surveillance program. It is not intended to diagnose MRSA infection nor to guide or monitor treatment for MRSA infections. Test performance is not FDA approved in patients less than 39 years old. Performed at [facility], [address] [place], [place] [address]     IMAGING RESULTS:  I  have personally reviewed the films ? Impression/Recommendation COPD exacerbation  Bilateral multinodular lesions just progressing over the last few months In October she had sputum sent to have [facility] and it was negative for AFB because there was a concern for atypical mycobacteria Because of worsening changes in the lung seen by pulmonologist.  Appreciate Dr>Aleskerov's opinion Sputum for AFB is ordered Patient is currently on doxycycline after receiving a day or 2 of ceftriaxone and azithromycin.  Patient is a current smoker and that is contributing to her picture.  Currently I do not have a role and if the cultures come back positive for MAC and if pulmonary wants me to treat her then I will see her .  So I will sign off now.  Call if needed. ? ___________________________________________________ Discussed with patient, requesting provider Note:  This document was prepared using Dragon voice recognition software and may include unintentional dictation errors.

## 2021-06-20 DIAGNOSIS — R0603 Acute respiratory distress: Secondary | ICD-10-CM

## 2021-06-20 DIAGNOSIS — J9601 Acute respiratory failure with hypoxia: Secondary | ICD-10-CM

## 2021-06-20 LAB — RESPIRATORY PANEL BY PCR

## 2021-06-20 MED ORDER — PREDNISONE 10 MG (21) PO TBPK: ORAL_TABLET | ORAL | 0 refills | Status: DC

## 2021-06-20 MED ORDER — DOXYCYCLINE HYCLATE 100 MG PO TABS: 100.0000 mg | ORAL_TABLET | Freq: Two times a day (BID) | ORAL | 0 refills | Status: AC

## 2021-06-20 NOTE — Plan of Care (Signed)
DISCHARGE NOTE HOME Julia [name] to be discharged home per MD order. Discussed prescriptions and follow up appointments with the patient. Medication list explained in detail. Patient verbalized understanding.  Skin clean, dry and intact without evidence of skin break down, no evidence of skin tears noted. IV catheter discontinued intact. Site without signs and symptoms of complications. Dressing and pressure applied. Pt denies pain at the site currently. No complaints noted.  Patient free of lines, drains, and wounds.   An After Visit Summary (AVS) was printed and given to the patient. Patient escorted via wheelchair, and discharged home via private auto.  Arlice Colt, RN

## 2021-06-20 NOTE — Discharge Summary (Signed)
Physician Discharge Summary   Patient: Julia [name] MRN: [id number] DOB: 1960-10-17  Admit date:     06/18/2021  Discharge date: 06/20/21  Discharge Physician: Max Sane   PCP: Romualdo Bolk, FNP   Recommendations at discharge:   Follow-up with outpatient providers as requested  Discharge Diagnoses: Principal Problem:   COPD with acute exacerbation ([place]) Active Problems:   Acute respiratory failure with hypoxia (HCC)   Lung nodule   Depression with anxiety   Nicotine dependence   Hypomagnesemia   Elevated troponin   Respiratory distress   Hospital Course: 61 y.o. female with medical history significant for COPD with chronic respiratory failure on 2 L of oxygen at night, depression, nicotine dependence, ventral hernia admitted for acute on chronic respiratory failure likely due to COPD exacerbation  2/17: Seen by pulmonary, ID, cardiology.  Further infectious and pulmonary work-up in progress including bronc  Assessment and Plan: * COPD with acute exacerbation ([place])- (present on admission) Improved with steroids and antibiotic Recommend outpatient follow-up with her pulmonologist Dr[facility] for further evaluation of lung lesions.  Patient is planning to call the office on Monday  Depression with anxiety- (present on admission) Continue home medicines  Lung nodule- (present on admission) Appreciate ID and pulmonary input.  She may need bronch to evaluate for MAC or atypical mycobacteria.  She would like to have that evaluated by her pulmonologist at [facility] as an outpatient  Acute respiratory failure with hypoxia ([place]) Initially required BiPAP but now weaned off to room air.  At baseline she wears 2 L oxygen at night           Consultants: Cardiology, pulmonary, ID Disposition: Home Diet recommendation:  Discharge Diet Orders (From admission, onward)     Start     Ordered   06/20/21 0000  Diet - low sodium heart healthy        06/20/21 0813            Cardiac diet  DISCHARGE MEDICATION: Allergies as of 06/20/2021       Reactions   Banana Nausea And Vomiting        Medication List     STOP taking these medications    acetylcysteine 20 % nebulizer solution Commonly known as: MUCOMYST   ipratropium-albuterol 0.5-2.5 (3) MG/3ML Soln Commonly known as: DUONEB   nicotine 21 mg/24hr patch Commonly known as: NICODERM CQ - dosed in mg/24 hours   predniSONE 10 MG tablet Commonly known as: DELTASONE Replaced by: predniSONE 10 MG (21) Tbpk tablet       TAKE these medications    acetaminophen 500 MG tablet Commonly known as: TYLENOL Take 1,000 mg by mouth every 6 (six) hours as needed.   albuterol (2.5 MG/3ML) 0.083% nebulizer solution Commonly known as: PROVENTIL Take 2.5 mg by nebulization every 6 (six) hours as needed for wheezing or shortness of breath.   albuterol 108 (90 Base) MCG/ACT inhaler Commonly known as: VENTOLIN HFA Inhale 2 puffs into the lungs every 6 (six) hours as needed for wheezing or shortness of breath.   ALPRAZolam 0.5 MG tablet Commonly known as: XANAX Take 0.25-0.5 mg by mouth 2 (two) times daily as needed for anxiety.   dextromethorphan-guaiFENesin 30-600 MG 12hr tablet Commonly known as: MUCINEX DM Take 1 tablet by mouth 2 (two) times daily.   docusate sodium 100 MG capsule Commonly known as: COLACE Take 100 mg by mouth daily as needed for mild constipation.   doxycycline 100 MG tablet Commonly known  as: VIBRA-TABS Take 1 tablet (100 mg total) by mouth every 12 (twelve) hours for 5 days.   ferrous sulfate 325 (65 FE) MG tablet Take 1 tablet (325 mg total) by mouth 2 (two) times daily with a meal. What changed:  how much to take when to take this   gabapentin 300 MG capsule Commonly known as: NEURONTIN Take 300 mg by mouth 3 (three) times daily.   magnesium oxide 400 MG tablet Commonly known as: MAG-OX Take 400 mg by mouth daily.   pantoprazole 40 MG tablet Commonly known  as: PROTONIX Take 40 mg by mouth daily.   Potassium 99 MG Tabs Take 2 tablets by mouth daily.   predniSONE 10 MG (21) Tbpk tablet Commonly known as: STERAPRED UNI-PAK 21 TAB Start 60 mg po daily, taper 10 mg daily until finish Replaces: predniSONE 10 MG tablet   sertraline 100 MG tablet Commonly known as: ZOLOFT Take 150 mg by mouth daily.   TRELEGY ELLIPTA IN Inhale 1 puff into the lungs daily.        Follow-up Information     Corey Skains, MD Follow up in 1 month(s).   Specialty: Cardiology Contact information: [facility] [place] [address] [phone]         Romualdo Bolk, FNP. Schedule an appointment as soon as possible for a visit in 2 week(s).   Specialty: Nurse Practitioner Why: [facility] Discharge F/UP Contact information: [address] [place] [address] [phone]         Einar Crow, MD. Schedule an appointment as soon as possible for a visit in 1 week(s).   Specialty: Pulmonary Disease Why: [facility] Discharge F/UP, If symptoms worsen Contact information: [id number] [place] [address] [phone]                 Discharge Exam: Danley Danker Weights   06/18/21 0551  Weight: 7.18 kg   61 year old female lying in the bed comfortably without any acute distress Eyes pupil equal round reactive to light accommodation, no scleral icterus Lungs clear to auscultation bilaterally, no rales or rhonchi Cardiovascular S1, S2 Normal, no murmur rubs or gallop Abdomen soft, benign Neuro alert and oriented, nonfocal Skin no rash or lesion  Condition at discharge: good  The results of significant diagnostics from this hospitalization (including imaging, microbiology, ancillary and laboratory) are listed below for reference.   Imaging Studies: CT CHEST WO CONTRAST  Result Date: 06/18/2021 CLINICAL DATA:  61 year old female with  shortness of breath, COPD. Progressed reticulonodular opacity in both lungs since last year. Prior suspicion of atypical/mycobacterium infectious process. EXAM: CT CHEST WITHOUT CONTRAST TECHNIQUE: Multidetector CT imaging of the chest was performed following the standard protocol without IV contrast. RADIATION DOSE REDUCTION: This exam was performed according to the departmental dose-optimization program which includes automated exposure control, adjustment of the mA and/or kV according to patient size and/or use of iterative reconstruction technique. COMPARISON:  Portable chest earlier today.  Chest CT 02/10/2021. FINDINGS: Cardiovascular: Calcified aortic atherosclerosis. Calcified coronary artery atherosclerosis. Mild cardiomegaly. No pericardial effusion. Mediastinum/Nodes: Moderate to large gastric hiatal hernia is stable. Reactive appearing mediastinal lymph nodes are stable. Lungs/Pleura: Major airways are patent. Lung volumes are stable. Widespread small peribronchial nodular opacity last year, with more moderate sized chronic nodules in the left upper lobe associated with chronic bronchial wall thickening. New widespread peribronchial nodular and irregular opacity throughout the right lower lobe and  middle lobe now. Slightly increased nodularity also in the left lower lobe, although extensive chronic nodularity there. No pleural effusion. No significant consolidation at this time. Upper Abdomen: Negative visible noncontrast liver, spleen, pancreas, adrenal glands and left kidney. Stable visible bowel in the upper abdomen including ventral abdominal wall diastasis or hernia of the transverse colon. Musculoskeletal: Chronic T11 compression fracture. Chronic mildly angulated sternal fracture. No acute osseous abnormality identified. IMPRESSION: 1. 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 No  pleural effusion. 2. Stable moderate to large gastric hiatal hernia. 3. Calcified coronary artery and Aortic Atherosclerosis (ICD10-I70.0). Electronically Signed   By: Genevie Ann M.D.   On: 06/18/2021 11:39   DG Chest Port 1 View  Result Date: 06/18/2021 CLINICAL DATA:  Shortness of breath. Difficulty breathing. History of COPD. EXAM: PORTABLE CHEST 1 VIEW COMPARISON:  03/18/2021 FINDINGS: Stable cardiomediastinal contours. Again noted is diffuse increased reticular and nodular opacities throughout both lungs which appears increased from the previous exam. Within the left upper lobe there is a new 3 cm nodule with central cavitation. Unchanged appearance new perihilar opacity is noted within the right midlung. Within the medial right lung base there is a persistent opacity which may represent an area of atelectasis, scarring or pneumonia. IMPRESSION: 1. Interval progression of chronic lung disease with increased reticular and nodular opacities throughout both lungs. 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 2. New 3 cm left upper lobe nodule with central cavitation. Additionally there is a new perihilar opacity in the right midlung and a persistent opacity within the medial right lung base. Recommend further evaluation with CT of the chest for more definitive characterization. Electronically Signed   By: Kerby Moors M.D.   On: 06/18/2021 06:09   ECHOCARDIOGRAM COMPLETE  Result Date: 06/19/2021    ECHOCARDIOGRAM REPORT   Patient Name:   Julia [name] [facility] Date of Exam: 06/19/2021 Medical Rec #:  [id number]        Height:       65.0 in Accession #:    [id number]       Weight:       129.0 lb Date of Birth:  [birth year]       BSA:          1.642 m Patient Age:    82 years         BP:           96/58 mmHg Patient Gender: F                HR:           63 bpm. Exam Location:  [facility] Procedure: 2D Echo, Color Doppler and Cardiac Doppler Indications:     Elevated troponin  History:        Patient has no prior history of Echocardiogram examinations.                 COPD.  Sonographer:    Charmayne Sheer Referring Phys: [id number] AGBATA Diagnosing      Serafina Royals MD Phys:  Sonographer Comments: Suboptimal subcostal window. IMPRESSIONS  1. Left ventricular ejection fraction, by estimation, is 60 to 65%. The left ventricle has normal function. The left ventricle has no regional wall motion abnormalities. Left ventricular diastolic parameters were normal.  2. Right ventricular systolic function is normal. The right ventricular size is normal.  3. The mitral valve  is normal in structure. Trivial mitral valve regurgitation.  4. The aortic valve is normal in structure. Aortic valve regurgitation is not visualized. FINDINGS  Left Ventricle: Left ventricular ejection fraction, by estimation, is 60 to 65%. The left ventricle has normal function. The left ventricle has no regional wall motion abnormalities. The left ventricular internal cavity size was normal in size. There is  no left ventricular hypertrophy. Left ventricular diastolic parameters were normal. Right Ventricle: The right ventricular size is normal. No increase in right ventricular wall thickness. Right ventricular systolic function is normal. Left Atrium: Left atrial size was normal in size. Right Atrium: Right atrial size was normal in size. Pericardium: There is no evidence of pericardial effusion. Mitral Valve: The mitral valve is normal in structure. Trivial mitral valve regurgitation. MV peak gradient, 4.8 mmHg. The mean mitral valve gradient is 2.0 mmHg. Tricuspid Valve: The tricuspid valve is normal in structure. Tricuspid valve regurgitation is trivial. Aortic Valve: The aortic valve is normal in structure. Aortic valve regurgitation is not visualized. Aortic valve mean gradient measures 6.0 mmHg. Aortic valve peak gradient measures 12.7 mmHg. Aortic valve area, by VTI measures 2.48 cm. Pulmonic  Valve: The pulmonic valve was normal in structure. Pulmonic valve regurgitation is not visualized. Aorta: The aortic root and ascending aorta are structurally normal, with no evidence of dilitation. IAS/Shunts: No atrial level shunt detected by color flow Doppler.  LEFT VENTRICLE PLAX 2D LVIDd:         4.86 cm   Diastology LVIDs:         3.13 cm   LV e' medial:    6.96 cm/s LV PW:         0.98 cm   LV E/e' medial:  14.5 LV IVS:        0.73 cm   LV e' lateral:   9.90 cm/s LVOT diam:     2.10 cm   LV E/e' lateral: 10.2 LV SV:         85 LV SV Index:   52 LVOT Area:     3.46 cm  RIGHT VENTRICLE RV Basal diam:  3.69 cm LEFT ATRIUM             Index        RIGHT ATRIUM           Index LA diam:        3.80 cm 2.31 cm/m   RA Area:     14.80 cm LA Vol (A2C):   46.3 ml 28.20 ml/m  RA Volume:   35.20 ml  21.44 ml/m LA Vol (A4C):   54.4 ml 33.13 ml/m LA Biplane Vol: 49.9 ml 30.39 ml/m  AORTIC VALVE                     PULMONIC VALVE AV Area (Vmax):    2.39 cm      PV Vmax:       1.16 m/s AV Area (Vmean):   2.10 cm      PV Vmean:      81.000 cm/s AV Area (VTI):     2.48 cm      PV VTI:        0.246 m AV Vmax:           178.00 cm/s   PV Peak grad:  5.4 mmHg AV Vmean:          113.000 cm/s  PV Mean grad:  3.0 mmHg AV VTI:  0.342 m AV Peak Grad:      12.7 mmHg AV Mean Grad:      6.0 mmHg LVOT Vmax:         123.00 cm/s LVOT Vmean:        68.500 cm/s LVOT VTI:          0.245 m LVOT/AV VTI ratio: 0.72  AORTA Ao Root diam: 3.10 cm MITRAL VALVE MV Area (PHT): 4.08 cm     SHUNTS MV Area VTI:   2.93 cm     Systemic VTI:  0.24 m MV Peak grad:  4.8 mmHg     Systemic Diam: 2.10 cm MV Mean grad:  2.0 mmHg MV Vmax:       1.10 m/s MV Vmean:      70.3 cm/s MV Decel Time: 186 msec MV E velocity: 101.00 cm/s MV A velocity: 98.50 cm/s MV E/A ratio:  1.03 Serafina Royals MD Electronically signed by Serafina Royals MD Signature Date/Time: 06/19/2021/5:05:15 PM    Final     Microbiology: Results for orders placed or performed  during the hospital encounter of 06/18/21  Culture, blood (routine x 2)     Status: None (Preliminary result)   Collection Time: 06/18/21  5:39 AM   Specimen: BLOOD  Result Value Ref Range Status   Specimen Description BLOOD LEFT [facility]  Final   Special Requests   Final    BOTTLES DRAWN AEROBIC AND ANAEROBIC Blood Culture adequate volume   Culture   Final    NO GROWTH 2 DAYS Performed at [facility], [address], [place], [place] [address]    Report Status PENDING  Incomplete  Resp Panel by RT-PCR (Flu A&B, Covid) Nasopharyngeal Swab     Status: None   Collection Time: 06/18/21  5:39 AM   Specimen: Nasopharyngeal Swab; Nasopharyngeal(NP) swabs in vial transport medium  Result Value Ref Range Status   SARS Coronavirus 2 by RT PCR NEGATIVE NEGATIVE Final    Comment: (NOTE) SARS-CoV-2 target nucleic acids are NOT DETECTED.  The SARS-CoV-2 RNA is generally detectable in upper respiratory specimens during the acute phase of infection. The lowest concentration of SARS-CoV-2 viral copies this assay can detect is 138 copies/mL. A negative result does not preclude SARS-Cov-2 infection and should not be used as the sole basis for treatment or other patient management decisions. A negative result may occur with  improper specimen collection/handling, submission of specimen other than nasopharyngeal swab, presence of viral mutation(s) within the areas targeted by this assay, and inadequate number of viral copies(<138 copies/mL). A negative result must be combined with clinical observations, patient history, and epidemiological information. The expected result is Negative.  Fact Sheet for Patients:  EntrepreneurPulse.com.au  Fact Sheet for Healthcare Providers:  IncredibleEmployment.be  This test is no t yet approved or cleared by the [place] FDA and  has been authorized for detection and/or diagnosis of SARS-CoV-2 by FDA under an  Emergency Use Authorization (EUA). This EUA will remain  in effect (meaning this test can be used) for the duration of the COVID-19 declaration under Section 564(b)(1) of the Act, 21 U.S.C.section 360bbb-3(b)(1), unless the authorization is terminated  or revoked sooner.       Influenza A by PCR NEGATIVE NEGATIVE Final   Influenza B by PCR NEGATIVE NEGATIVE Final    Comment: (NOTE) The Xpert Xpress SARS-CoV-2/FLU/RSV plus assay is intended as an aid in the diagnosis of influenza from Nasopharyngeal swab specimens and should not be used as a sole  basis for treatment. Nasal washings and aspirates are unacceptable for Xpert Xpress SARS-CoV-2/FLU/RSV testing.  Fact Sheet for Patients: EntrepreneurPulse.com.au  Fact Sheet for Healthcare Providers: IncredibleEmployment.be  This test is not yet approved or cleared by the [place] FDA and has been authorized for detection and/or diagnosis of SARS-CoV-2 by FDA under an Emergency Use Authorization (EUA). This EUA will remain in effect (meaning this test can be used) for the duration of the COVID-19 declaration under Section 564(b)(1) of the Act, 21 U.S.C. section 360bbb-3(b)(1), unless the authorization is terminated or revoked.  Performed at [facility], [address] [place], [place] [address]   Culture, blood (routine x 2)     Status: None (Preliminary result)   Collection Time: 06/18/21  5:40 AM   Specimen: BLOOD  Result Value Ref Range Status   Specimen Description BLOOD RIGHT [facility]  Final   Special Requests   Final    BOTTLES DRAWN AEROBIC AND ANAEROBIC Blood Culture results may not be optimal due to an excessive volume of blood received in culture bottles   Culture   Final    NO GROWTH 2 DAYS Performed at [facility], [address], [place], [place] [address]    Report Status PENDING  Incomplete  Respiratory (~20 pathogens) panel by PCR     Status: None    Collection Time: 06/19/21 12:27 PM   Specimen: Nasopharyngeal Swab; Respiratory  Result Value Ref Range Status   Adenovirus NOT DETECTED NOT DETECTED Final   Coronavirus 229E NOT DETECTED NOT DETECTED Final    Comment: (NOTE) The Coronavirus on the Respiratory Panel, DOES NOT test for the novel  Coronavirus (2019 nCoV)    Coronavirus HKU1 NOT DETECTED NOT DETECTED Final   Coronavirus NL63 NOT DETECTED NOT DETECTED Final   Coronavirus OC43 NOT DETECTED NOT DETECTED Final   Metapneumovirus NOT DETECTED NOT DETECTED Final   Rhinovirus / Enterovirus NOT DETECTED NOT DETECTED Final   Influenza A NOT DETECTED NOT DETECTED Final   Influenza B NOT DETECTED NOT DETECTED Final   Parainfluenza Virus 1 NOT DETECTED NOT DETECTED Final   Parainfluenza Virus 2 NOT DETECTED NOT DETECTED Final   Parainfluenza Virus 3 NOT DETECTED NOT DETECTED Final   Parainfluenza Virus 4 NOT DETECTED NOT DETECTED Final   Respiratory Syncytial Virus NOT DETECTED NOT DETECTED Final   Bordetella pertussis NOT DETECTED NOT DETECTED Final   Bordetella Parapertussis NOT DETECTED NOT DETECTED Final   Chlamydophila pneumoniae NOT DETECTED NOT DETECTED Final   Mycoplasma pneumoniae NOT DETECTED NOT DETECTED Final    Comment: Performed at [facility], [place] [address] [place], [place] [address]  MRSA Next Gen by PCR, Nasal     Status: Abnormal   Collection Time: 06/19/21 12:27 PM   Specimen: Nasal Mucosa; Nasal Swab  Result Value Ref Range Status   MRSA by PCR Next Gen DETECTED (A) NOT DETECTED Final    Comment: RESULT CALLED TO, READ BACK BY AND VERIFIED WITH: BRITTANY BALLARD AT 1841 ON 06/19/21 BY SS (NOTE) The GeneXpert MRSA Assay (FDA approved for NASAL specimens only), is one component of a comprehensive MRSA colonization surveillance program. It is not intended to diagnose MRSA infection nor to guide or monitor treatment for MRSA infections. Test performance is not FDA approved in patients less than 46  years old. Performed at [facility], [address] [place], [place] [address]     Labs: CBC: Recent Labs  Lab 06/18/21 0539 06/19/21 0523  WBC  11.9* 11.4*  NEUTROABS 10.2*  --   HGB 10.5* 9.7*  HCT 34.6* 32.1*  MCV 84.8 84.9  PLT 175 482   Basic Metabolic Panel: Recent Labs  Lab 06/18/21 0539 06/19/21 0523  NA 138 139  K 4.7 4.6  CL 102 104  CO2 28 29  GLUCOSE 116* 93  BUN 17 23  CREATININE 0.66 0.63  CALCIUM 10.0 9.7  MG 1.6*  --    Liver Function Tests: Recent Labs  Lab 06/18/21 0539  AST 24  ALT 18  ALKPHOS 57  BILITOT 0.4  PROT 7.2  ALBUMIN 3.8   CBG: No results for input(s): GLUCAP in the last 168 hours.  Discharge time spent: greater than 30 minutes.  Signed: Max Sane, MD [facility] 06/20/2021

## 2021-06-22 LAB — ASPERGILLUS ANTIGEN, BAL/SERUM: Aspergillus Ag, BAL/Serum: 0.06 Index (ref 0.00–0.49)

## 2021-06-22 LAB — LEGIONELLA PNEUMOPHILA TOTAL AB: Legionella Pneumo Total Ab: <0.91 OD ratio (ref 0.00–0.90)

## 2021-06-23 LAB — CULTURE, BLOOD (ROUTINE X 2)
Culture: NO GROWTH
Culture: NO GROWTH
Special Requests: ADEQUATE

## 2021-06-23 LAB — FUNGITELL, SERUM: Fungitell Result: 126 pg/mL — ABNORMAL HIGH (ref <80)

## 2021-07-19 ENCOUNTER — Emergency Department: Payer: 59

## 2021-07-19 ENCOUNTER — Encounter: Payer: Self-pay | Admitting: *Deleted

## 2021-07-19 ENCOUNTER — Inpatient Hospital Stay
Admission: EM | Admit: 2021-07-19 | Discharge: 2021-07-21 | DRG: 190 | Disposition: A | Discharge status: Home / Self Care | Payer: 59 | Attending: Internal Medicine | Admitting: Internal Medicine

## 2021-07-19 ENCOUNTER — Other Ambulatory Visit: Payer: Self-pay

## 2021-07-19 ENCOUNTER — Observation Stay: Payer: 59

## 2021-07-19 DIAGNOSIS — Z20822 Contact with and (suspected) exposure to covid-19: Secondary | ICD-10-CM | POA: Diagnosis present

## 2021-07-19 DIAGNOSIS — B9710 Unspecified enterovirus as the cause of diseases classified elsewhere: Secondary | ICD-10-CM | POA: Diagnosis present

## 2021-07-19 DIAGNOSIS — F418 Other specified anxiety disorders: Secondary | ICD-10-CM | POA: Diagnosis present

## 2021-07-19 DIAGNOSIS — F17210 Nicotine dependence, cigarettes, uncomplicated: Secondary | ICD-10-CM | POA: Diagnosis present

## 2021-07-19 DIAGNOSIS — F419 Anxiety disorder, unspecified: Secondary | ICD-10-CM | POA: Diagnosis present

## 2021-07-19 DIAGNOSIS — J441 Chronic obstructive pulmonary disease with (acute) exacerbation: Secondary | ICD-10-CM | POA: Diagnosis not present

## 2021-07-19 DIAGNOSIS — Z79899 Other long term (current) drug therapy: Secondary | ICD-10-CM

## 2021-07-19 DIAGNOSIS — Z9851 Tubal ligation status: Secondary | ICD-10-CM

## 2021-07-19 DIAGNOSIS — Z91018 Allergy to other foods: Secondary | ICD-10-CM

## 2021-07-19 DIAGNOSIS — Z9981 Dependence on supplemental oxygen: Secondary | ICD-10-CM

## 2021-07-19 DIAGNOSIS — Z8701 Personal history of pneumonia (recurrent): Secondary | ICD-10-CM

## 2021-07-19 DIAGNOSIS — F17200 Nicotine dependence, unspecified, uncomplicated: Secondary | ICD-10-CM | POA: Diagnosis present

## 2021-07-19 DIAGNOSIS — J9621 Acute and chronic respiratory failure with hypoxia: Secondary | ICD-10-CM | POA: Diagnosis present

## 2021-07-19 DIAGNOSIS — B9789 Other viral agents as the cause of diseases classified elsewhere: Secondary | ICD-10-CM | POA: Diagnosis present

## 2021-07-19 DIAGNOSIS — F32A Depression, unspecified: Secondary | ICD-10-CM | POA: Diagnosis present

## 2021-07-19 LAB — BRAIN NATRIURETIC PEPTIDE: B Natriuretic Peptide: 77.2 pg/mL (ref 0.0–100.0)

## 2021-07-19 LAB — CBC WITH DIFFERENTIAL/PLATELET
Abs Immature Granulocytes: 0.03 10*3/uL (ref 0.00–0.07)
Basophils Absolute: 0 10*3/uL (ref 0.0–0.1)
Basophils Relative: 0 %
Eosinophils Absolute: 0.3 10*3/uL (ref 0.0–0.5)
Eosinophils Relative: 5 %
HCT: 35.4 % — ABNORMAL LOW (ref 36.0–46.0)
Hemoglobin: 10.3 g/dL — ABNORMAL LOW (ref 12.0–15.0)
Immature Granulocytes: 1 %
Lymphocytes Relative: 23 %
Lymphs Abs: 1.2 10*3/uL (ref 0.7–4.0)
MCH: 24.9 pg — ABNORMAL LOW (ref 26.0–34.0)
MCHC: 29.1 g/dL — ABNORMAL LOW (ref 30.0–36.0)
MCV: 85.5 fL (ref 80.0–100.0)
Monocytes Absolute: 0.6 10*3/uL (ref 0.1–1.0)
Monocytes Relative: 12 %
Neutro Abs: 3.1 10*3/uL (ref 1.7–7.7)
Neutrophils Relative %: 59 %
Platelets: 168 10*3/uL (ref 150–400)
RBC: 4.14 MIL/uL (ref 3.87–5.11)
RDW: 17.2 % — ABNORMAL HIGH (ref 11.5–15.5)
WBC: 5.2 10*3/uL (ref 4.0–10.5)
nRBC: 0 % (ref 0.0–0.2)

## 2021-07-19 LAB — BASIC METABOLIC PANEL
Anion gap: 6 (ref 5–15)
BUN: 21 mg/dL (ref 8–23)
CO2: 31 mmol/L (ref 22–32)
Calcium: 9.3 mg/dL (ref 8.9–10.3)
Chloride: 103 mmol/L (ref 98–111)
Creatinine, Ser: 0.83 mg/dL (ref 0.44–1.00)
GFR, Estimated: >60 mL/min (ref >60)
Glucose, Bld: 99 mg/dL (ref 70–99)
Potassium: 4.6 mmol/L (ref 3.5–5.1)
Sodium: 140 mmol/L (ref 135–145)

## 2021-07-19 LAB — URINE DRUG SCREEN, QUALITATIVE (ARMC ONLY)
Amphetamines, Ur Screen: NOT DETECTED
Barbiturates, Ur Screen: NOT DETECTED
Benzodiazepine, Ur Scrn: POSITIVE — AB
Cannabinoid 50 Ng, Ur ~~LOC~~: POSITIVE — AB
Cocaine Metabolite,Ur ~~LOC~~: NOT DETECTED
MDMA (Ecstasy)Ur Screen: NOT DETECTED
Methadone Scn, Ur: NOT DETECTED
Opiate, Ur Screen: NOT DETECTED
Phencyclidine (PCP) Ur S: NOT DETECTED
Tricyclic, Ur Screen: NOT DETECTED

## 2021-07-19 LAB — RESP PANEL BY RT-PCR (FLU A&B, COVID) ARPGX2
Influenza A by PCR: NEGATIVE
Influenza B by PCR: NEGATIVE
SARS Coronavirus 2 by RT PCR: NEGATIVE

## 2021-07-19 LAB — BLOOD GAS, VENOUS
Acid-Base Excess: 5.3 mmol/L — ABNORMAL HIGH (ref 0.0–2.0)
Bicarbonate: 33.2 mmol/L — ABNORMAL HIGH (ref 20.0–28.0)
O2 Saturation: 57.1 %
Patient temperature: 37
pCO2, Ven: 63 mmHg — ABNORMAL HIGH (ref 44–60)
pH, Ven: 7.33 (ref 7.25–7.43)
pO2, Ven: 37 mmHg (ref 32–45)

## 2021-07-19 LAB — SEDIMENTATION RATE: Sed Rate: 32 mm/hr — ABNORMAL HIGH (ref 0–30)

## 2021-07-19 LAB — TROPONIN I (HIGH SENSITIVITY)
Troponin I (High Sensitivity): 5 ng/L (ref <18)
Troponin I (High Sensitivity): 3 ng/L (ref <18)

## 2021-07-19 LAB — C-REACTIVE PROTEIN: CRP: 1 mg/dL — ABNORMAL HIGH (ref <1.0)

## 2021-07-19 LAB — MRSA NEXT GEN BY PCR, NASAL: MRSA by PCR Next Gen: NOT DETECTED

## 2021-07-19 MED ORDER — FERROUS SULFATE 325 (65 FE) MG PO TABS
650.0000 mg | ORAL_TABLET | Freq: Every day | ORAL | Status: DC
Start: 2021-07-20 — End: 2021-07-21
  Administered 2021-07-20 – 2021-07-21 (×2): 650 mg via ORAL
  Filled 2021-07-19 (×2): qty 2

## 2021-07-19 MED ORDER — SODIUM CHLORIDE 0.9 % IV SOLN
250.0000 mL | INTRAVENOUS | Status: DC | PRN
Start: 2021-07-19 — End: 2021-07-21

## 2021-07-19 MED ORDER — IPRATROPIUM-ALBUTEROL 0.5-2.5 (3) MG/3ML IN SOLN
3.0000 mL | Freq: Once | RESPIRATORY_TRACT | Status: AC
  Administered 2021-07-19: 3 mL via RESPIRATORY_TRACT
  Filled 2021-07-19: qty 6

## 2021-07-19 MED ORDER — ACETAMINOPHEN 650 MG RE SUPP: 650.0000 mg | Freq: Four times a day (QID) | RECTAL | Status: DC | PRN

## 2021-07-19 MED ORDER — IPRATROPIUM-ALBUTEROL 0.5-2.5 (3) MG/3ML IN SOLN
3.0000 mL | Freq: Once | RESPIRATORY_TRACT | Status: AC
  Administered 2021-07-19: 3 mL via RESPIRATORY_TRACT

## 2021-07-19 MED ORDER — SERTRALINE HCL 50 MG PO TABS
150.0000 mg | ORAL_TABLET | Freq: Every day | ORAL | Status: DC
  Administered 2021-07-19 – 2021-07-20 (×2): 150 mg via ORAL
  Filled 2021-07-19 (×2): qty 3

## 2021-07-19 MED ORDER — ACETAMINOPHEN 500 MG PO TABS: 1000.0000 mg | ORAL_TABLET | Freq: Four times a day (QID) | ORAL | Status: DC | PRN

## 2021-07-19 MED ORDER — SODIUM CHLORIDE 0.9 % IV SOLN
500.0000 mg | INTRAVENOUS | Status: DC
  Administered 2021-07-20 – 2021-07-21 (×2): 500 mg via INTRAVENOUS
  Filled 2021-07-19: qty 5
  Filled 2021-07-19 (×2): qty 500

## 2021-07-19 MED ORDER — SODIUM CHLORIDE 0.9 % IV SOLN
2.0000 g | INTRAVENOUS | Status: DC
  Administered 2021-07-19 – 2021-07-21 (×3): 2 g via INTRAVENOUS
  Filled 2021-07-19: qty 2
  Filled 2021-07-19: qty 20
  Filled 2021-07-19: qty 2

## 2021-07-19 MED ORDER — ONDANSETRON HCL 4 MG/2ML IJ SOLN: 4.0000 mg | Freq: Four times a day (QID) | INTRAMUSCULAR | Status: DC | PRN

## 2021-07-19 MED ORDER — PREDNISONE 20 MG PO TABS
40.0000 mg | ORAL_TABLET | Freq: Every day | ORAL | Status: DC
  Administered 2021-07-21: 40 mg via ORAL
  Filled 2021-07-19: qty 2

## 2021-07-19 MED ORDER — SODIUM CHLORIDE 0.9% FLUSH
3.0000 mL | Freq: Two times a day (BID) | INTRAVENOUS | Status: DC
  Administered 2021-07-19 – 2021-07-21 (×3): 3 mL via INTRAVENOUS

## 2021-07-19 MED ORDER — ONDANSETRON HCL 4 MG PO TABS: 4.0000 mg | ORAL_TABLET | Freq: Four times a day (QID) | ORAL | Status: DC | PRN

## 2021-07-19 MED ORDER — METHYLPREDNISOLONE SODIUM SUCC 40 MG IJ SOLR
40.0000 mg | Freq: Two times a day (BID) | INTRAMUSCULAR | Status: AC
  Administered 2021-07-19 – 2021-07-20 (×2): 40 mg via INTRAVENOUS
  Filled 2021-07-19 (×2): qty 1

## 2021-07-19 MED ORDER — METHYLPREDNISOLONE SODIUM SUCC 125 MG IJ SOLR
125.0000 mg | Freq: Once | INTRAMUSCULAR | Status: AC
  Administered 2021-07-19: 125 mg via INTRAVENOUS
  Filled 2021-07-19: qty 2

## 2021-07-19 MED ORDER — ENOXAPARIN SODIUM 40 MG/0.4ML IJ SOSY
40.0000 mg | PREFILLED_SYRINGE | INTRAMUSCULAR | Status: DC
  Administered 2021-07-19 – 2021-07-20 (×2): 40 mg via SUBCUTANEOUS
  Filled 2021-07-19 (×2): qty 0.4

## 2021-07-19 MED ORDER — POTASSIUM 99 MG PO TABS: 2.0000 | ORAL_TABLET | Freq: Every day | ORAL | Status: DC

## 2021-07-19 MED ORDER — PANTOPRAZOLE SODIUM 40 MG PO TBEC
40.0000 mg | DELAYED_RELEASE_TABLET | Freq: Every day | ORAL | Status: DC
  Administered 2021-07-19 – 2021-07-21 (×3): 40 mg via ORAL
  Filled 2021-07-19 (×3): qty 1

## 2021-07-19 MED ORDER — ALBUTEROL SULFATE (2.5 MG/3ML) 0.083% IN NEBU
2.5000 mg | INHALATION_SOLUTION | Freq: Four times a day (QID) | RESPIRATORY_TRACT | Status: DC | PRN
  Administered 2021-07-20 (×2): 2.5 mg via RESPIRATORY_TRACT
  Filled 2021-07-19 (×2): qty 3

## 2021-07-19 MED ORDER — FLUTICASONE FUROATE-VILANTEROL 100-25 MCG/ACT IN AEPB
1.0000 | INHALATION_SPRAY | Freq: Every day | RESPIRATORY_TRACT | Status: DC
  Administered 2021-07-20 – 2021-07-21 (×2): 1 via RESPIRATORY_TRACT
  Filled 2021-07-19 (×2): qty 28

## 2021-07-19 MED ORDER — FLUTICASONE-UMECLIDIN-VILANT 100-62.5-25 MCG/ACT IN AEPB: INHALATION_SPRAY | Freq: Every day | RESPIRATORY_TRACT | Status: DC

## 2021-07-19 MED ORDER — GABAPENTIN 300 MG PO CAPS
300.0000 mg | ORAL_CAPSULE | Freq: Three times a day (TID) | ORAL | Status: DC
  Administered 2021-07-19 – 2021-07-21 (×7): 300 mg via ORAL
  Filled 2021-07-19 (×7): qty 1

## 2021-07-19 MED ORDER — ACETAMINOPHEN 325 MG PO TABS
650.0000 mg | ORAL_TABLET | Freq: Four times a day (QID) | ORAL | Status: DC | PRN
  Administered 2021-07-19: 650 mg via ORAL
  Filled 2021-07-19: qty 2

## 2021-07-19 MED ORDER — SODIUM CHLORIDE 0.9% FLUSH: 3.0000 mL | INTRAVENOUS | Status: DC | PRN

## 2021-07-19 MED ORDER — MAGNESIUM SULFATE 2 GM/50ML IV SOLN
2.0000 g | Freq: Once | INTRAVENOUS | Status: AC
  Administered 2021-07-19: 2 g via INTRAVENOUS
  Filled 2021-07-19: qty 50

## 2021-07-19 MED ORDER — ALPRAZOLAM 0.5 MG PO TABS
0.2500 mg | ORAL_TABLET | Freq: Two times a day (BID) | ORAL | Status: DC | PRN
  Administered 2021-07-19: 0.25 mg via ORAL
  Administered 2021-07-20 – 2021-07-21 (×2): 0.5 mg via ORAL
  Filled 2021-07-19 (×3): qty 1

## 2021-07-19 MED ORDER — UMECLIDINIUM BROMIDE 62.5 MCG/ACT IN AEPB
1.0000 | INHALATION_SPRAY | Freq: Every day | RESPIRATORY_TRACT | Status: DC
Start: 2021-07-20 — End: 2021-07-21
  Administered 2021-07-20 – 2021-07-21 (×2): 1 via RESPIRATORY_TRACT
  Filled 2021-07-19 (×2): qty 7

## 2021-07-19 NOTE — ED Provider Notes (Signed)
? ?[facility] ?Provider Note ? ? ? Event Date/Time  ? First MD Initiated Contact with Patient 07/19/21 0700   ?  (approximate) ? ? ?History  ? ?Shortness of Breath ? ? ?HPI ? ?Julia [name] is a 61 y.o. 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  She has been having symptoms of been worsening over the past several days.  Has not been on any antibiotics in the past week or 2 was admitted in February for COPD exacerbation placed on doxycycline.  She denies any nausea or vomiting.  No chest pain or pressure.  She does continue to smoke. ?  ? ? ?Physical Exam  ? ?Triage Vital Signs: ?ED Triage Vitals  ?Enc Vitals Group  ?   BP 07/19/21 0640 109/67  ?   Pulse Rate 07/19/21 0640 (!) 58  ?   Resp 07/19/21 0640 16  ?   Temp 07/19/21 0640 98.1 ?F (36.7 ?C)  ?   Temp Source 07/19/21 0640 Oral  ?   SpO2 07/19/21 0640 95 %  ?   Weight 07/19/21 0639 127 lb (57.6 kg)  ?   Height 07/19/21 0639 5\' 5"  (1.651 m)  ?   Head Circumference --   ?   Peak Flow --   ?   Pain Score 07/19/21 0638 5  ?   Pain Loc --   ?   Pain Edu? --   ?   Excl. in GC? --   ? ? ?Most recent vital signs: ?Vitals:  ? 07/19/21 0640 07/19/21 0800  ?BP: 109/67 (!) 106/52  ?Pulse: (!) 58 (!) 57  ?Resp: 16 17  ?Temp: 98.1 ?F (36.7 ?C)   ?SpO2: 95% 100%  ? ? ? ?Constitutional: Alert  ?Eyes: Conjunctivae are normal.  ?Head: Atraumatic. ?Nose: No congestion/rhinnorhea. ?Mouth/Throat: Mucous membranes are moist.   ?Neck: Painless ROM.  ?Cardiovascular:   Good peripheral circulation. Regular rate and rhythm ?Respiratory: tachypnea but protecting airway,  diffuse inspiratory and expiratory wheeze, prolonged exp phase  ?Gastrointestinal: Soft and nontender.  ?Musculoskeletal:  no deformity ?Neurologic:  MAE spontaneously. No gross focal neurologic deficits are appreciated.  ?Skin:  Skin is warm, dry and intact. No rash noted. ?Psychiatric: Mood and  affect are normal. Speech and behavior are normal. ? ? ? ?ED Results / Procedures / Treatments  ? ?Labs ?(all labs ordered are listed, but only abnormal results are displayed) ?Labs Reviewed  ?CBC WITH DIFFERENTIAL/PLATELET - Abnormal; Notable for the following components:  ?    Result Value  ? Hemoglobin 10.3 (*)   ? HCT 35.4 (*)   ? MCH 24.9 (*)   ? MCHC 29.1 (*)   ? RDW 17.2 (*)   ? All other components within normal limits  ?BLOOD GAS, VENOUS - Abnormal; Notable for the following components:  ? pCO2, Ven 63 (*)   ? Bicarbonate 33.2 (*)   ? Acid-Base Excess 5.3 (*)   ? All other components within normal limits  ?RESP PANEL BY RT-PCR (FLU A&B, COVID) ARPGX2  ?BASIC METABOLIC PANEL  ?BRAIN NATRIURETIC PEPTIDE  ?TROPONIN I (HIGH SENSITIVITY)  ?TROPONIN I (HIGH SENSITIVITY)  ? ? ? ?EKG ? ?ED ECG REPORT ?I, 07/21/21, the attending physician, personally viewed and interpreted this ECG. ? ? Date: 07/19/2021 ? EKG Time: 7:17 ? Rate: 60 ? Rhythm: sinus ? Axis: right ? Intervals:  normal ?  ST&T Change: occasional pvc, no stemi, no depression ? ? ? ?RADIOLOGY ?Please see ED Course for my review and interpretation. ? ?I personally reviewed all radiographic images ordered to evaluate for the above acute complaints and reviewed radiology reports and findings.  These findings were personally discussed with the patient.  Please see medical record for radiology report. ? ? ? ?PROCEDURES: ? ?Critical Care performed: No ? ?.1-3 Lead EKG Interpretation ?Performed by: Willy Eddy, MD ?Authorized by: Willy Eddy, MD  ? ?  Interpretation: normal   ?  ECG rate:  65 ?  ECG rate assessment: normal   ?  Rhythm: sinus rhythm   ? ? ?MEDICATIONS ORDERED IN ED: ?Medications  ?ipratropium-albuterol (DUONEB) 0.5-2.5 (3) MG/3ML nebulizer solution 3 mL (has no administration in time range)  ?ipratropium-albuterol (DUONEB) 0.5-2.5 (3) MG/3ML nebulizer solution 3 mL (3 mLs Nebulization Given 07/19/21 0757)  ?ipratropium-albuterol  (DUONEB) 0.5-2.5 (3) MG/3ML nebulizer solution 3 mL (3 mLs Nebulization Given 07/19/21 0756)  ?methylPREDNISolone sodium succinate (SOLU-MEDROL) 125 mg/2 mL injection 125 mg (125 mg Intravenous Given 07/19/21 0754)  ? ? ? ?IMPRESSION / MDM / ASSESSMENT AND PLAN / ED COURSE  ?I reviewed the triage vital signs and the nursing notes. ?             ?               ? ?Differential diagnosis includes, but is not limited to, Asthma, copd, CHF, pna, ptx, malignancy, Pe, anemia ? ?Patient presenting with shortness of breath cough congestion as described above in the setting of moderate to severe COPD and recent admission to the hospital for hypoxic respiratory failure.  She not complain of any chest pain or now.  EKG is nonischemic.  Chest x-ray and blood work ordered for above differential.  Exam seems consistent with acute COPD exacerbation.  She arrives on supplemental oxygen does not require BiPAP at this time.  Have low suspicion for PE based on presentation and findings seem more consistent with acute COPD ? ? ?Clinical Course as of 07/19/21 0851  ?Wynelle Link Jul 19, 2021  ?0730 Chest x-ray on my review and interpretation does not show any evidence of pneumothorax. [PR]  ?8921 Patient reassessed.  Feels some mild improvement after the nebulizer but still mildly tachypneic feels quite tired and her exam is still consistent with wheezing both inspiratory and expiratory.  Does have mild hypercapnia but is compensated.  Troponin negative.  Viral panel negative.  Does not seem consistent with acute pneumonia.  BNP is normal.  Based on her work of breathing I do believe that observation the hospital is indicated for additional nebulizers and monitoring.Marland Kitchen  Hospitalist consulted and agreed admit patient for further management on their service. [PR]  ?  ?Clinical Course User Index ?[PR] Willy Eddy, MD  ? ? ? ?FINAL CLINICAL IMPRESSION(S) / ED DIAGNOSES  ? ?Final diagnoses:  ?COPD exacerbation (HCC)  ? ? ? ?Rx / DC Orders  ? ?ED  Discharge Orders   ? ? None  ? ?  ? ? ? ?Note:  This document was prepared using Dragon voice recognition software and may include unintentional dictation errors. ? ?  ?Willy Eddy, MD ?07/19/21 [phone] ? ?

## 2021-07-19 NOTE — Consult Note (Signed)
? ? ? ? ? ? ?PULMONOLOGY ? ? ? ? ? ? ? ? ?Date: 07/19/2021,   ?MRN# [id number] Julia [name] Jul 15, 1960 ? ? ?  ?AdmissionWeight: 57.6 kg                 ?CurrentWeight: 57.6 kg ? ? ?Referring physician: Dr Manuella Ghazi ? ? ?CHIEF COMPLAINT:  ? ?Right lower lobe infiltrate  ? ? ?HISTORY OF PRESENT ILLNESS  ? ?61 yo F with hx of COPD chronic hypoxemia, MDD, nicotine use , Hiatal and ventral hernia who presents to ER for sob. She was found to have acute on chronic hypoxemia and required 3L/min of oxygen supplementally.  She received solumedrol and antibiotics on arrival with improvement. She also had CT chest which I reviewed and there are multifocal infiltrates improved from previous specifically interval improvement of RLL infiltrates consistent with resolution of pneumonia at this lung zone.  She is ok during interview and states she can provide resp cultures to [place] for microbiology. She came in due to not fully recovering clinically from previous hospitalization when diagnosed with pneumonia of RLL. She continues to vape and smoke cigarretes but denies illicit drug use or alcoholism. She is not having hemoptysis but does endorse high volume thickened phlegm with darker discoloration consistent with AECOPD.  ? ? ?PAST MEDICAL HISTORY  ? ?Past Medical History:  ?Diagnosis Date  ?? Anemia   ?? Anxiety   ?? Asthma   ?? COPD (chronic obstructive pulmonary disease) ([place])   ?? Depression   ?? Ventral hernia   ? ? ? ?SURGICAL HISTORY  ? ?Past Surgical History:  ?Procedure Laterality Date  ?? COLON SURGERY    ?? EAR TUBE REMOVAL    ?? FRACTURE SURGERY    ?? HERNIA REPAIR    ?? TUBAL LIGATION    ? ? ? ?FAMILY HISTORY  ? ?Family History  ?Problem Relation Age of Onset  ?? Sarcoidosis Mother   ?? Alcohol abuse Father   ? ? ? ?SOCIAL HISTORY  ? ?Social History  ? ?Tobacco Use  ?? Smoking status: Former  ?  Packs/day: 0.50  ?  Types: Cigarettes  ?  Quit date: 04/25/2020  ?  Years since quitting: 1.2  ?? Smokeless tobacco: Never   ?Substance Use Topics  ?? Alcohol use: No  ?? Drug use: No  ? ? ? ?MEDICATIONS  ? ? ?Home Medication:  ?  ?Current Medication: ? ?Current Facility-Administered Medications:  ??  0.9 %  sodium chloride infusion, 250 mL, Intravenous, PRN, Agbata, Tochukwu, MD ??  acetaminophen (TYLENOL) tablet 650 mg, 650 mg, Oral, Q6H PRN **OR** acetaminophen (TYLENOL) suppository 650 mg, 650 mg, Rectal, Q6H PRN, Agbata, Tochukwu, MD ??  albuterol (PROVENTIL) (2.5 MG/3ML) 0.083% nebulizer solution 2.5 mg, 2.5 mg, Nebulization, Q6H PRN, Agbata, Tochukwu, MD ??  ALPRAZolam (XANAX) tablet 0.25-0.5 mg, 0.25-0.5 mg, Oral, BID PRN, Agbata, Tochukwu, MD ??  azithromycin (ZITHROMAX) 500 mg in sodium chloride 0.9 % 250 mL IVPB, 500 mg, Intravenous, Q24H, Agbata, Tochukwu, MD ??  cefTRIAXone (ROCEPHIN) 2 g in sodium chloride 0.9 % 100 mL IVPB, 2 g, Intravenous, Q24H, Agbata, Tochukwu, MD ??  enoxaparin (LOVENOX) injection 40 mg, 40 mg, Subcutaneous, Q24H, Agbata, Tochukwu, MD ??  [START ON 07/20/2021] ferrous sulfate tablet 650 mg, 650 mg, Oral, Q breakfast, Agbata, Tochukwu, MD ??  Fluticasone-Umeclidin-Vilant 100-62.5-25 MCG/ACT AEPB, , Inhalation, Daily, Agbata, Tochukwu, MD ??  gabapentin (NEURONTIN) capsule 300 mg, 300 mg, Oral, TID, Agbata, Tochukwu, MD ??  magnesium sulfate  IVPB 2 g 50 mL, 2 g, Intravenous, Once, Agbata, Tochukwu, MD ??  methylPREDNISolone sodium succinate (SOLU-MEDROL) 40 mg/mL injection 40 mg, 40 mg, Intravenous, Q12H **FOLLOWED BY** [START ON 07/21/2021] predniSONE (DELTASONE) tablet 40 mg, 40 mg, Oral, Q breakfast, Agbata, Tochukwu, MD ??  ondansetron (ZOFRAN) tablet 4 mg, 4 mg, Oral, Q6H PRN **OR** ondansetron (ZOFRAN) injection 4 mg, 4 mg, Intravenous, Q6H PRN, Agbata, Tochukwu, MD ??  pantoprazole (PROTONIX) EC tablet 40 mg, 40 mg, Oral, Daily, Agbata, Tochukwu, MD ??  Potassium TABS 198 mg, 2 tablet, Oral, Daily, Agbata, Tochukwu, MD ??  sertraline (ZOLOFT) tablet 150 mg, 150 mg, Oral, Daily, Agbata, Tochukwu,  MD ??  sodium chloride flush (NS) 0.9 % injection 3 mL, 3 mL, Intravenous, Q12H, Agbata, Tochukwu, MD ??  sodium chloride flush (NS) 0.9 % injection 3 mL, 3 mL, Intravenous, PRN, Agbata, Tochukwu, MD ? ?Current Outpatient Medications:  ??  acetaminophen (TYLENOL) 500 MG tablet, Take 1,000 mg by mouth every 6 (six) hours as needed., Disp: , Rfl:  ??  albuterol (PROVENTIL HFA;VENTOLIN HFA) 108 (90 BASE) MCG/ACT inhaler, Inhale 2 puffs into the lungs every 6 (six) hours as needed for wheezing or shortness of breath., Disp: 1 Inhaler, Rfl: 2 ??  albuterol (PROVENTIL) (2.5 MG/3ML) 0.083% nebulizer solution, Take 2.5 mg by nebulization every 6 (six) hours as needed for wheezing or shortness of breath., Disp: , Rfl:  ??  ALPRAZolam (XANAX) 0.5 MG tablet, Take 0.25-0.5 mg by mouth 2 (two) times daily as needed for anxiety., Disp: , Rfl:  ??  dextromethorphan-guaiFENesin (MUCINEX DM) 30-600 MG 12hr tablet, Take 1 tablet by mouth 2 (two) times daily., Disp: 60 tablet, Rfl: 0 ??  docusate sodium (COLACE) 100 MG capsule, Take 100 mg by mouth daily as needed for mild constipation., Disp: , Rfl:  ??  ferrous sulfate 325 (65 FE) MG tablet, Take 1 tablet (325 mg total) by mouth 2 (two) times daily with a meal. (Patient taking differently: Take 650 mg by mouth daily with breakfast.), Disp: 60 tablet, Rfl: 1 ??  Fluticasone-Umeclidin-Vilant (TRELEGY ELLIPTA IN), Inhale 1 puff into the lungs daily., Disp: , Rfl:  ??  gabapentin (NEURONTIN) 300 MG capsule, Take 300 mg by mouth 3 (three) times daily., Disp: , Rfl:  ??  magnesium oxide (MAG-OX) 400 MG tablet, Take 400 mg by mouth daily., Disp: , Rfl:  ??  pantoprazole (PROTONIX) 40 MG tablet, Take 40 mg by mouth daily., Disp: , Rfl:  ??  Potassium 99 MG TABS, Take 2 tablets by mouth daily., Disp: , Rfl:  ??  predniSONE (STERAPRED UNI-PAK 21 TAB) 10 MG (21) TBPK tablet, Start 60 mg po daily, taper 10 mg daily until finish, Disp: 21 tablet, Rfl: 0 ??  sertraline (ZOLOFT) 100 MG tablet,  Take 150 mg by mouth daily., Disp: , Rfl:  ? ? ? ?ALLERGIES  ? ?Banana ? ? ? ? ?REVIEW OF SYSTEMS  ? ? ?Review of Systems: ? ?Gen:  Denies  fever, sweats, chills weigh loss  ?HEENT: Denies blurred vision, double vision, ear pain, eye pain, hearing loss, nose bleeds, sore throat ?Cardiac:  No dizziness, chest pain or heaviness, chest tightness,edema ?Resp:   Denies cough or sputum porduction, shortness of breath,wheezing, hemoptysis,  ?Gi: Denies swallowing difficulty, stomach pain, nausea or vomiting, diarrhea, constipation, bowel incontinence ?Gu:  Denies bladder incontinence, burning urine ?Ext:   Denies Joint pain, stiffness or swelling ?Skin: Denies  skin rash, easy bruising or bleeding or hives ?Endoc:  Denies polyuria, polydipsia ,  polyphagia or weight change ?Psych:   Denies depression, insomnia or hallucinations  ? ?Other:  All other systems negative ? ? ?VS: BP 110/69   Pulse 60   Temp 98.1 ?F (36.7 ?C) (Oral)   Resp 18   Ht 5\' 5"  (1.651 m)   Wt 57.6 kg   SpO2 98%   BMI 21.13 kg/m?   ? ? ? ?PHYSICAL EXAM  ? ? ?GENERAL:NAD, no fevers, chills, no weakness no fatigue ?HEAD: Normocephalic, atraumatic.  ?EYES: Pupils equal, round, reactive to light. Extraocular muscles intact. No scleral icterus.  ?MOUTH: Moist mucosal membrane. Dentition intact. No abscess noted.  ?EAR, NOSE, THROAT: Clear without exudates. No external lesions.  ?NECK: Supple. No thyromegaly. No nodules. No JVD.  ?PULMONARY: Diffuse coarse rhonchi right sided +wheezes ?CARDIOVASCULAR: S1 and S2. Regular rate and rhythm. No murmurs, rubs, or gallops. No edema. Pedal pulses 2+ bilaterally.  ?GASTROINTESTINAL: Soft, nontender, nondistended. No masses. Positive bowel sounds. No hepatosplenomegaly.  ?MUSCULOSKELETAL: No swelling, clubbing, or edema. Range of motion full in all extremities.  ?NEUROLOGIC: Cranial nerves II through XII are intact. No gross focal neurological deficits. Sensation intact. Reflexes intact.  ?SKIN: No ulceration,  lesions, rashes, or cyanosis. Skin warm and dry. Turgor intact.  ?PSYCHIATRIC: Mood, affect within normal limits. The patient is awake, alert and oriented x 3. Insight, judgment intact.  ? ? ?  ? ?IMAGING  ? ? ?DG Chest 2

## 2021-07-19 NOTE — ED Notes (Signed)
Pt to ED with husband, states feels SOB with walking, pt smokes "3-4" cigarettes per day. ? ?Denies chest pain. States has congested cough since yesterday. ?

## 2021-07-19 NOTE — ED Notes (Signed)
Patient to radiology via stretcher

## 2021-07-19 NOTE — Assessment & Plan Note (Addendum)
Smoking cessation has been discussed with patient in detail ?She declines a nicotine transdermal patch. ?

## 2021-07-19 NOTE — Assessment & Plan Note (Addendum)
Continue Zoloft and alprazolam. ?

## 2021-07-19 NOTE — ED Triage Notes (Signed)
Pt woke up this am with sob; pt recently treated for a lung infection with prednisone and antibiotic; pt has very congested cough; wheezing and rhonchi heard in all lobes; pt given albuterol treatment en route by ems; pt only complaint at this time is headache ?

## 2021-07-19 NOTE — H&P (Signed)
?History and Physical  ? ? ?PatientLUCIEL [name] HFW:[id number] DOB: 1960/09/24 ?DOA: 07/19/2021 ?DOS: the patient was seen and examined on 07/19/2021 ?PCP: Olena Leatherwood, FNP  ?Patient coming from: Home ? ?Chief Complaint:  ?Chief Complaint  ?Patient presents with  ? Shortness of Breath  ? ?HPI: Julia [name] is a 61 y.o. female with medical history significant for COPD with chronic respiratory failure on 2 L of oxygen at night, nicotine dependence, depression and history of a ventral hernia who presents to the ER via EMS for worsening shortness of breath from her baseline. ?Patient was recently admitted to the hospital from 02/16 - 02/18 for acute COPD exacerbation with bronchopneumonia.  According to the patient she does not think that she has fully recovered from that episode.  She presents for evaluation of worsening shortness of breath associated with a cough productive of yellow phlegm and wheezing.  She also has some nasal drainage.  She has had chills and chest tightness but denies having any fever and denies having any sick contacts. ?She denies having any abdominal pain, no changes in her bowel habits, no urinary symptoms, no nausea, no vomiting, no dizziness, no lightheadedness, no blurred vision or focal deficit. ?She continues to smoke about 3 to 4 cigarettes daily. ?She received multiple nebulizer treatments in the ER and Solu-Medrol 125 mg IV x1 dose. ?She will be referred to observation status for further evaluation ?Review of Systems: As mentioned in the history of present illness. All other systems reviewed and are negative. ?Past Medical History:  ?Diagnosis Date  ? Anemia   ? Anxiety   ? Asthma   ? COPD (chronic obstructive pulmonary disease) (HCC)   ? Depression   ? Ventral hernia   ? ?Past Surgical History:  ?Procedure Laterality Date  ? COLON SURGERY    ? EAR TUBE REMOVAL    ? FRACTURE SURGERY    ? HERNIA REPAIR    ? TUBAL LIGATION    ? ?Social History:  reports that she quit smoking  about 14 months ago. Her smoking use included cigarettes. She smoked an average of .5 packs per day. She has never used smokeless tobacco. She reports that she does not drink alcohol and does not use drugs. ? ?Allergies  ?Allergen Reactions  ? Banana Nausea And Vomiting  ? ? ?Family History  ?Problem Relation Age of Onset  ? Sarcoidosis Mother   ? Alcohol abuse Father   ? ? ?Prior to Admission medications   ?Medication Sig Start Date End Date Taking? Authorizing Provider  ?acetaminophen (TYLENOL) 500 MG tablet Take 1,000 mg by mouth every 6 (six) hours as needed.    [provider]  ?albuterol (PROVENTIL HFA;VENTOLIN HFA) 108 (90 BASE) MCG/ACT inhaler Inhale 2 puffs into the lungs every 6 (six) hours as needed for wheezing or shortness of breath. 04/09/15   Adrian Saran, MD  ?albuterol (PROVENTIL) (2.5 MG/3ML) 0.083% nebulizer solution Take 2.5 mg by nebulization every 6 (six) hours as needed for wheezing or shortness of breath.    [provider]  ?ALPRAZolam Prudy Feeler) 0.5 MG tablet Take 0.25-0.5 mg by mouth 2 (two) times daily as needed for anxiety. 12/17/20 12/17/21  [provider]  ?dextromethorphan-guaiFENesin (MUCINEX DM) 30-600 MG 12hr tablet Take 1 tablet by mouth 2 (two) times daily. 03/20/21   Rolly Salter, MD  ?docusate sodium (COLACE) 100 MG capsule Take 100 mg by mouth daily as needed for mild constipation.    [provider]  ?  ferrous sulfate 325 (65 FE) MG tablet Take 1 tablet (325 mg total) by mouth 2 (two) times daily with a meal. ?Patient taking differently: Take 650 mg by mouth daily with breakfast. 11/10/15   Altamese DillingVachhani, Vaibhavkumar, MD  ?Fluticasone-Umeclidin-Vilant (TRELEGY ELLIPTA IN) Inhale 1 puff into the lungs daily.    [provider]  ?gabapentin (NEURONTIN) 300 MG capsule Take 300 mg by mouth 3 (three) times daily. 12/17/20   [provider]  ?magnesium oxide (MAG-OX) 400 MG tablet Take 400 mg by mouth daily.    [provider]   ?pantoprazole (PROTONIX) 40 MG tablet Take 40 mg by mouth daily.    [provider]  ?Potassium 99 MG TABS Take 2 tablets by mouth daily.    [provider]  ?predniSONE (STERAPRED UNI-PAK 21 TAB) 10 MG (21) TBPK tablet Start 60 mg po daily, taper 10 mg daily until finish 06/20/21   Delfino LovettShah, Vipul, MD  ?sertraline (ZOLOFT) 100 MG tablet Take 150 mg by mouth daily.    [provider]  ? ? ?Physical Exam: ?Vitals:  ? 07/19/21 0639 07/19/21 0640 07/19/21 0800 07/19/21 0830  ?BP:  109/67 (!) 106/52 110/69  ?Pulse:  (!) 58 (!) 57 60  ?Resp:  16 17 18   ?Temp:  98.1 ?F (36.7 ?C)    ?TempSrc:  Oral    ?SpO2:  95% 100% 98%  ?Weight: 57.6 kg     ?Height: 5\' 5"  (1.651 m)     ? ?Physical Exam ?Vitals and nursing note reviewed.  ?Constitutional:   ?   Appearance: She is normal weight.  ?HENT:  ?   Head: Normocephalic and atraumatic.  ?   Mouth/Throat:  ?   Mouth: Mucous membranes are moist.  ?Eyes:  ?   Pupils: Pupils are equal, round, and reactive to light.  ?Cardiovascular:  ?   Rate and Rhythm: Normal rate and regular rhythm.  ?Pulmonary:  ?   Effort: Pulmonary effort is normal.  ?   Breath sounds: Examination of the right-upper field reveals wheezing. Examination of the left-upper field reveals wheezing. Examination of the right-middle field reveals wheezing. Examination of the left-middle field reveals wheezing. Examination of the right-lower field reveals wheezing. Examination of the left-lower field reveals wheezing. Wheezing present.  ?Abdominal:  ?   General: Bowel sounds are normal.  ?   Palpations: Abdomen is soft.  ?Musculoskeletal:     ?   General: Normal range of motion.  ?   Cervical back: Normal range of motion and neck supple.  ?Skin: ?   General: Skin is warm and dry.  ?Neurological:  ?   General: No focal deficit present.  ?   Mental Status: She is alert and oriented to person, place, and time.  ?Psychiatric:     ?   Mood and Affect: Mood normal.     ?   Behavior: Behavior normal.   ? ? ?Data Reviewed: ?Relevant notes from primary care and specialist visits, past discharge summaries as available in EHR, including Care Everywhere. ?Prior diagnostic testing as pertinent to current admission diagnoses ?Updated medications and problem lists for reconciliation ?ED course, including vitals, labs, imaging, treatment and response to treatment ?Triage notes, nursing and pharmacy notes and ED provider's notes ?Notable results as noted in HPI ?Labs reviewed.  VBG 7.33/63/37/33.2/57 ?BNP 77.  Hemoglobin 10.3, hematocrit 35.4, RDW 17.2, platelet count 168 ?Chest x-ray reviewed by me shows Similar appearance of bilateral reticular and nodular opacities ?compatible chronic indolent atypical infection such  as MAI. ?12 Lead EKG shows normal sinus rhythm with PVC's ?There are no new results to review at this time. ? ?Assessment and Plan: ?* COPD with acute exacerbation (HCC) ?Patient with a history of COPD who presents to the ER for evaluation of worsening shortness of breath associated with chest tightness, cough productive of yellow phlegm and wheezing. 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 ?She was seen in consultation by pulmonary and ID and treated with antibiotic therapy. ?We will request pulmonology consult ? ?Nicotine dependence ?Smoking cessation has been discussed with patient in detail ?She declines a nicotine transdermal patch ? ?Depression with anxiety ?Continue Zoloft and alprazolam ? ? ? ? ? Advance Care Planning:   Code Status: Full Code  ? ?Consults: Pulmonary ? ?Family Communication: Greater than 50% of time was spent  discussing patient's condition and plan of care with her and her husband at the bedside.  All questions and concerns have been addressed.  They verbalized understanding and agree with the plan. ? ?Severity of

## 2021-07-19 NOTE — ED Notes (Signed)
Pt resting comfortably in bed, NAD. No needs verbalized at this time. Family at bedside. Bed low & locked; call light & personal items within reach. ?

## 2021-07-19 NOTE — Assessment & Plan Note (Addendum)
Patient with a history of COPD who presents to the ER for evaluation of worsening shortness of breath associated with chest tightness, cough productive of yellow phlegm and wheezing. 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 ?Fungitell trending up from 86->126 (11/22-2/23) ?Respiratory panel positive for rhino/enterovirus this admission ?Pulmonary considering bronc ?We will consult ID ? ?

## 2021-07-20 DIAGNOSIS — Z8701 Personal history of pneumonia (recurrent): Secondary | ICD-10-CM | POA: Diagnosis not present

## 2021-07-20 DIAGNOSIS — Z9981 Dependence on supplemental oxygen: Secondary | ICD-10-CM | POA: Diagnosis not present

## 2021-07-20 DIAGNOSIS — J9621 Acute and chronic respiratory failure with hypoxia: Secondary | ICD-10-CM | POA: Diagnosis present

## 2021-07-20 DIAGNOSIS — Z79899 Other long term (current) drug therapy: Secondary | ICD-10-CM | POA: Diagnosis not present

## 2021-07-20 DIAGNOSIS — Z20822 Contact with and (suspected) exposure to covid-19: Secondary | ICD-10-CM | POA: Diagnosis present

## 2021-07-20 DIAGNOSIS — Z9851 Tubal ligation status: Secondary | ICD-10-CM | POA: Diagnosis not present

## 2021-07-20 DIAGNOSIS — F419 Anxiety disorder, unspecified: Secondary | ICD-10-CM | POA: Diagnosis present

## 2021-07-20 DIAGNOSIS — B9710 Unspecified enterovirus as the cause of diseases classified elsewhere: Secondary | ICD-10-CM | POA: Diagnosis present

## 2021-07-20 DIAGNOSIS — F418 Other specified anxiety disorders: Secondary | ICD-10-CM

## 2021-07-20 DIAGNOSIS — B9789 Other viral agents as the cause of diseases classified elsewhere: Secondary | ICD-10-CM | POA: Diagnosis present

## 2021-07-20 DIAGNOSIS — Z91018 Allergy to other foods: Secondary | ICD-10-CM | POA: Diagnosis not present

## 2021-07-20 DIAGNOSIS — F32A Depression, unspecified: Secondary | ICD-10-CM | POA: Diagnosis present

## 2021-07-20 DIAGNOSIS — F17210 Nicotine dependence, cigarettes, uncomplicated: Secondary | ICD-10-CM | POA: Diagnosis present

## 2021-07-20 DIAGNOSIS — J441 Chronic obstructive pulmonary disease with (acute) exacerbation: Secondary | ICD-10-CM | POA: Diagnosis present

## 2021-07-20 DIAGNOSIS — F17209 Nicotine dependence, unspecified, with unspecified nicotine-induced disorders: Secondary | ICD-10-CM | POA: Diagnosis not present

## 2021-07-20 LAB — BASIC METABOLIC PANEL
Anion gap: 8 (ref 5–15)
BUN: 29 mg/dL — ABNORMAL HIGH (ref 8–23)
CO2: 27 mmol/L (ref 22–32)
Calcium: 9 mg/dL (ref 8.9–10.3)
Chloride: 103 mmol/L (ref 98–111)
Creatinine, Ser: 0.68 mg/dL (ref 0.44–1.00)
GFR, Estimated: >60 mL/min (ref >60)
Glucose, Bld: 169 mg/dL — ABNORMAL HIGH (ref 70–99)
Potassium: 4.6 mmol/L (ref 3.5–5.1)
Sodium: 138 mmol/L (ref 135–145)

## 2021-07-20 LAB — RESPIRATORY PANEL BY PCR

## 2021-07-20 LAB — CBC
HCT: 34.3 % — ABNORMAL LOW (ref 36.0–46.0)
Hemoglobin: 10.2 g/dL — ABNORMAL LOW (ref 12.0–15.0)
MCH: 25.2 pg — ABNORMAL LOW (ref 26.0–34.0)
MCHC: 29.7 g/dL — ABNORMAL LOW (ref 30.0–36.0)
MCV: 84.9 fL (ref 80.0–100.0)
Platelets: 173 10*3/uL (ref 150–400)
RBC: 4.04 MIL/uL (ref 3.87–5.11)
RDW: 17 % — ABNORMAL HIGH (ref 11.5–15.5)
WBC: 5.8 10*3/uL (ref 4.0–10.5)
nRBC: 0 % (ref 0.0–0.2)

## 2021-07-20 LAB — STREP PNEUMONIAE URINARY ANTIGEN: Strep Pneumo Urinary Antigen: NEGATIVE

## 2021-07-20 LAB — CRYPTOCOCCAL ANTIGEN: Crypto Ag: NEGATIVE

## 2021-07-20 LAB — GLUCOSE, CAPILLARY
Glucose-Capillary: 134 mg/dL — ABNORMAL HIGH (ref 70–99)
Glucose-Capillary: 117 mg/dL — ABNORMAL HIGH (ref 70–99)
Glucose-Capillary: 131 mg/dL — ABNORMAL HIGH (ref 70–99)

## 2021-07-20 MED ORDER — GUAIFENESIN 100 MG/5ML PO LIQD
5.0000 mL | ORAL | Status: DC | PRN
  Administered 2021-07-20: 5 mL via ORAL
  Filled 2021-07-20: qty 10

## 2021-07-20 NOTE — Consult Note (Signed)
? ? ? ? ? ? ?PULMONOLOGY ? ? ? ? ? ? ? ? ?Date: 07/20/2021,   ?MRN# [id number] Julia [name] [birth year] ? ? ?  ?AdmissionWeight: 57.6 kg                 ?CurrentWeight: 57.6 kg ? ? ?Referring physician: Dr Sherryll Burger ? ? ?CHIEF COMPLAINT:  ? ?Right lower lobe infiltrate  ? ? ?HISTORY OF PRESENT ILLNESS  ? ?61 yo F with hx of COPD chronic hypoxemia, MDD, nicotine use , Hiatal and ventral hernia who presents to ER for sob. She was found to have acute on chronic hypoxemia and required 3L/min of oxygen supplementally.  She received solumedrol and antibiotics on arrival with improvement. She also had CT chest which I reviewed and there are multifocal infiltrates improved from previous specifically interval improvement of RLL infiltrates consistent with resolution of pneumonia at this lung zone.  She is ok during interview and states she can provide resp cultures to [place] for microbiology. She came in due to not fully recovering clinically from previous hospitalization when diagnosed with pneumonia of RLL. She continues to vape and smoke cigarretes but denies illicit drug use or alcoholism. She is not having hemoptysis but does endorse high volume thickened phlegm with darker discoloration consistent with AECOPD.  ? ?07/20/21- patient is improved. We discussed acute exacerbation of COPD with viral LRTI.  She has elevated fungitell with likelihood of aspergillus. ? ?PAST MEDICAL HISTORY  ? ?Past Medical History:  ?Diagnosis Date  ?? Anemia   ?? Anxiety   ?? Asthma   ?? COPD (chronic obstructive pulmonary disease) (HCC)   ?? Depression   ?? Ventral hernia   ? ? ? ?SURGICAL HISTORY  ? ?Past Surgical History:  ?Procedure Laterality Date  ?? COLON SURGERY    ?? EAR TUBE REMOVAL    ?? FRACTURE SURGERY    ?? HERNIA REPAIR    ?? TUBAL LIGATION    ? ? ? ?FAMILY HISTORY  ? ?Family History  ?Problem Relation Age of Onset  ?? Sarcoidosis Mother   ?? Alcohol abuse Father   ? ? ? ?SOCIAL HISTORY  ? ?Social History  ? ?Tobacco Use  ?? Smoking  status: Former  ?  Packs/day: 0.50  ?  Types: Cigarettes  ?  Quit date: 04/25/2020  ?  Years since quitting: 1.2  ?? Smokeless tobacco: Never  ?Substance Use Topics  ?? Alcohol use: No  ?? Drug use: No  ? ? ? ?MEDICATIONS  ? ? ?Home Medication:  ?  ?Current Medication: ? ?Current Facility-Administered Medications:  ??  0.9 %  sodium chloride infusion, 250 mL, Intravenous, PRN, Agbata, Tochukwu, MD ??  acetaminophen (TYLENOL) tablet 650 mg, 650 mg, Oral, Q6H PRN, 650 mg at 07/19/21 1814 **OR** acetaminophen (TYLENOL) suppository 650 mg, 650 mg, Rectal, Q6H PRN, Agbata, Tochukwu, MD ??  albuterol (PROVENTIL) (2.5 MG/3ML) 0.083% nebulizer solution 2.5 mg, 2.5 mg, Nebulization, Q6H PRN, Agbata, Tochukwu, MD, 2.5 mg at 07/20/21 0928 ??  ALPRAZolam (XANAX) tablet 0.25-0.5 mg, 0.25-0.5 mg, Oral, BID PRN, Agbata, Tochukwu, MD, 0.25 mg at 07/19/21 2357 ??  azithromycin (ZITHROMAX) 500 mg in sodium chloride 0.9 % 250 mL IVPB, 500 mg, Intravenous, Q24H, Agbata, Tochukwu, MD, Stopped at 07/20/21 1033 ??  cefTRIAXone (ROCEPHIN) 2 g in sodium chloride 0.9 % 100 mL IVPB, 2 g, Intravenous, Q24H, Agbata, Tochukwu, MD, Last Rate: 200 mL/hr at 07/20/21 1034, 2 g at 07/20/21 1034 ??  enoxaparin (LOVENOX) injection 40 mg, 40 mg,  Subcutaneous, Q24H, Agbata, Tochukwu, MD, 40 mg at 07/19/21 2108 ??  ferrous sulfate tablet 650 mg, 650 mg, Oral, Q breakfast, Agbata, Tochukwu, MD, 650 mg at 07/20/21 0915 ??  fluticasone furoate-vilanterol (BREO ELLIPTA) 100-25 MCG/ACT 1 puff, 1 puff, Inhalation, Daily, 1 puff at 07/20/21 1037 **AND** umeclidinium bromide (INCRUSE ELLIPTA) 62.5 MCG/ACT 1 puff, 1 puff, Inhalation, Daily, Agbata, Tochukwu, MD, 1 puff at 07/20/21 1037 ??  gabapentin (NEURONTIN) capsule 300 mg, 300 mg, Oral, TID, Agbata, Tochukwu, MD, 300 mg at 07/20/21 0916 ??  ondansetron (ZOFRAN) tablet 4 mg, 4 mg, Oral, Q6H PRN **OR** ondansetron (ZOFRAN) injection 4 mg, 4 mg, Intravenous, Q6H PRN, Agbata, Tochukwu, MD ??  pantoprazole  (PROTONIX) EC tablet 40 mg, 40 mg, Oral, Daily, Agbata, Tochukwu, MD, 40 mg at 07/20/21 0915 ??  [COMPLETED] methylPREDNISolone sodium succinate (SOLU-MEDROL) 40 mg/mL injection 40 mg, 40 mg, Intravenous, Q12H, 40 mg at 07/20/21 0542 **FOLLOWED BY** [START ON 07/21/2021] predniSONE (DELTASONE) tablet 40 mg, 40 mg, Oral, Q breakfast, Agbata, Tochukwu, MD ??  sertraline (ZOLOFT) tablet 150 mg, 150 mg, Oral, Daily, Agbata, Tochukwu, MD, 150 mg at 07/20/21 0914 ??  sodium chloride flush (NS) 0.9 % injection 3 mL, 3 mL, Intravenous, Q12H, Agbata, Tochukwu, MD, 3 mL at 07/20/21 0917 ??  sodium chloride flush (NS) 0.9 % injection 3 mL, 3 mL, Intravenous, PRN, Agbata, Tochukwu, MD ? ? ? ?ALLERGIES  ? ?Banana ? ? ? ? ?REVIEW OF SYSTEMS  ? ? ?Review of Systems: ? ?Gen:  Denies  fever, sweats, chills weigh loss  ?HEENT: Denies blurred vision, double vision, ear pain, eye pain, hearing loss, nose bleeds, sore throat ?Cardiac:  No dizziness, chest pain or heaviness, chest tightness,edema ?Resp:   Denies cough or sputum porduction, shortness of breath,wheezing, hemoptysis,  ?Gi: Denies swallowing difficulty, stomach pain, nausea or vomiting, diarrhea, constipation, bowel incontinence ?Gu:  Denies bladder incontinence, burning urine ?Ext:   Denies Joint pain, stiffness or swelling ?Skin: Denies  skin rash, easy bruising or bleeding or hives ?Endoc:  Denies polyuria, polydipsia , polyphagia or weight change ?Psych:   Denies depression, insomnia or hallucinations  ? ?Other:  All other systems negative ? ? ?VS: BP 117/69 (BP Location: Left Arm)   Pulse 69   Temp 98.4 ?F (36.9 ?C) (Oral)   Resp 20   Ht 5\' 5"  (1.651 m)   Wt 57.6 kg   SpO2 98%   BMI 21.13 kg/m?   ? ? ? ?PHYSICAL EXAM  ? ? ?GENERAL:NAD, no fevers, chills, no weakness no fatigue ?HEAD: Normocephalic, atraumatic.  ?EYES: Pupils equal, round, reactive to light. Extraocular muscles intact. No scleral icterus.  ?MOUTH: Moist mucosal membrane. Dentition intact. No  abscess noted.  ?EAR, NOSE, THROAT: Clear without exudates. No external lesions.  ?NECK: Supple. No thyromegaly. No nodules. No JVD.  ?PULMONARY: Diffuse coarse rhonchi right sided +wheezes ?CARDIOVASCULAR: S1 and S2. Regular rate and rhythm. No murmurs, rubs, or gallops. No edema. Pedal pulses 2+ bilaterally.  ?GASTROINTESTINAL: Soft, nontender, nondistended. No masses. Positive bowel sounds. No hepatosplenomegaly.  ?MUSCULOSKELETAL: No swelling, clubbing, or edema. Range of motion full in all extremities.  ?NEUROLOGIC: Cranial nerves II through XII are intact. No gross focal neurological deficits. Sensation intact. Reflexes intact.  ?SKIN: No ulceration, lesions, rashes, or cyanosis. Skin warm and dry. Turgor intact.  ?PSYCHIATRIC: Mood, affect within normal limits. The patient is awake, alert and oriented x 3. Insight, judgment intact.  ? ? ?  ? ?IMAGING  ? ? ?DG Chest 2 View ? ?  Result Date: 07/19/2021 ?CLINICAL DATA:  Shortness of breath. EXAM: CHEST - 2 VIEW COMPARISON:  06/18/2021 FINDINGS: Stable cardiomediastinal contours. Mild diffuse increase interstitial markings. Chronic bilateral reticular and nodular interstitial opacities are also identified throughout both upper and lower lung zones. This is most severe in the left upper lobe. No superimposed lobar consolidation. No superimposed atelectasis, lobar consolidation or pneumothorax. IMPRESSION: 1. 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 Electronically Signed   By: Signa Kellaylor  Stroud M.D.   On: 07/19/2021 07:53  ? ?CT CHEST WO CONTRAST ? ?Result Date: 07/19/2021 ?CLINICAL DATA:  Follow-up pneumonia identified on a CT scan from June 18, 2021. Shortness of breath. EXAM: CT CHEST WITHOUT CONTRAST TECHNIQUE: Multidetector CT imaging of the chest was performed following the standard protocol without IV contrast. RADIATION DOSE REDUCTION: This exam was performed according to the  departmental dose-optimization program which includes automated exposure control, adjustment of the mA and/or kV according to patient size and/or use of iterative reconstruction technique. COMPARISON:  CT scan of the chest

## 2021-07-20 NOTE — Progress Notes (Signed)
?  Progress Note ? ? ?PatientCHALA GUL ZOX:[id number] DOB: 02/13/1961 DOA: 07/19/2021     0 ?DOS: the patient was seen and examined on 07/20/2021 ?  ?Brief hospital course: ?61 y.o. female with medical history significant for COPD with chronic respiratory failure on 2 L of oxygen at night, nicotine dependence, depression and history of a ventral hernia admitted for acute on chronic hypoxic respiratory failure due to COPD exacerbation ? ?3/20: Requiring 4 L oxygen.  Pulmonary considering bronc ? ? ? ? ?Assessment and Plan: ?* Acute on chronic respiratory failure with hypoxia (HCC) ?Due to COPD exacerbation.  At baseline she uses 2 L and now she is on 4 L.  Wean oxygen as able.  Pulmonary following ? ?Nicotine dependence ?Smoking cessation has been discussed with patient in detail ?She declines a nicotine transdermal patch. ? ?COPD with acute exacerbation (HCC) ?Patient with a history of COPD who presents to the ER for evaluation of worsening shortness of breath associated with chest tightness, cough productive of yellow phlegm and wheezing. 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 ?Fungitell trending up from 86->126 (11/22-2/23) ?Respiratory panel positive for rhino/enterovirus this admission ?Pulmonary considering bronc ?We will consult ID ? ? ?Depression with anxiety ?Continue Zoloft and alprazolam. ? ? ? ? ?  ? ?Subjective: Very short of breath and cough, very weak.  Wants to get treated by our pulmonologist instead of going to [place] ? ?Physical Exam: ?Vitals:  ? 07/20/21 0518 07/20/21 0700 07/20/21 0928 07/20/21 1125  ?BP: 116/71 128/66  117/69  ?Pulse: 65 60  69  ?Resp: 16 18  20   ?Temp: (!) 97.3 ?F (36.3 ?C) 97.6 ?F (36.4 ?C)  98.4 ?F (36.9 ?C)  ?TempSrc: Oral Oral  Oral  ?SpO2: 99% 98% 94% 98%  ?Weight:      ?Height:       ? ?61 year old female lying in the bed in acute respiratory distress ?Eyes pupil equal round reactive to light and accommodation, no scleral icterus ?Lungs expiratory wheezing throughout both lungs no rales ?Cardiovascular [place] normal, no murmur ?Abdomen soft, benign ?Neuro alert and oriented, nonfocal ?Skin no rash or lesion ? ? ?Data Reviewed: ? ?Fungitell 86->126 (11/22-2/23), rhin/enterovirus positive and respiratory panel this admission ? ?Family Communication: None ? ?Disposition: ?Status is: Inpatient ?Remains inpatient appropriate because: Requiring 4 L oxygen and getting pulmonary work-up ? ? Planned Discharge Destination: Home with Home Health ? ? ? DVT prophylaxis-Lovenox ?Time spent: 35 minutes ? ?Author: [id number], MD ?07/20/2021 12:02 PM ? ?For on call review [email or web address].  ?

## 2021-07-20 NOTE — Plan of Care (Signed)
?  Problem: Education: ?Goal: Knowledge of disease or condition will improve ?Outcome: Progressing ?Goal: Knowledge of the prescribed therapeutic regimen will improve ?Outcome: Progressing ?Goal: Individualized Educational Video(s) ?Outcome: Progressing ?  ?Problem: Activity: ?Goal: Ability to tolerate increased activity will improve ?Outcome: Progressing ?Goal: Will verbalize the importance of balancing activity with adequate rest periods ?Outcome: Progressing ?  ?Problem: Respiratory: ?Goal: Ability to maintain a clear airway will improve ?Outcome: Progressing ?Goal: Levels of oxygenation will improve ?Outcome: Progressing ?Goal: Ability to maintain adequate ventilation will improve ?Outcome: Progressing ?  ?Problem: Activity: ?Goal: Ability to tolerate increased activity will improve ?Outcome: Progressing ?  ?Problem: Clinical Measurements: ?Goal: Ability to maintain a body temperature in the normal range will improve ?Outcome: Progressing ?  ?Problem: Respiratory: ?Goal: Ability to maintain adequate ventilation will improve ?Outcome: Progressing ?Goal: Ability to maintain a clear airway will improve ?Outcome: Progressing ?  ?

## 2021-07-20 NOTE — Progress Notes (Signed)
?  [facility]   ? ?Date of Admission:  07/19/2021   Total days of antibiotics 2 ?        ? ?ID: Julia [name] is a 61 y.o. female with   ?Principal Problem: ?  Acute on chronic respiratory failure with hypoxia (HCC) ?Active Problems: ?  Depression with anxiety ?  COPD with acute exacerbation (HCC) ?  Nicotine dependence ? ? ? ?Subjective: ?Afebrile, but still has productive cough, worsening of late. She reports she was using inhalers prior to coming to hospital. Has grandkids with sinusitis, no other sick contacts. She was previously hospitalized last month for copd exacerbation where AFB cx were negative, +fungitell. Imaging mentioned signs c/w with MAI but not confirmed on culture. She was to follow up with her pulmonologist but has returned to hospitalon 3/18 with worsening respiratory symptoms. On this admission, she is afebrile, no leukocytosis, imaging shows some improvement from prior RLL infiltrate. + productive/phlegm cough. Still smokes. She was started on ceftriaxone and azithromycin. Her work up thus far showing +rhinovirus on RVP. ? ?Medications:  ? enoxaparin (LOVENOX) injection  40 mg Subcutaneous Q24H  ? ferrous sulfate  650 mg Oral Q breakfast  ? fluticasone furoate-vilanterol  1 puff Inhalation Daily  ? And  ? umeclidinium bromide  1 puff Inhalation Daily  ? gabapentin  300 mg Oral TID  ? pantoprazole  40 mg Oral Daily  ? [START ON 07/21/2021] predniSONE  40 mg Oral Q breakfast  ? sertraline  150 mg Oral Daily  ? sodium chloride flush  3 mL Intravenous Q12H  ?family history includes Alcohol abuse in her father; Sarcoidosis in her mother.  ?Social History  ? ?Tobacco Use  ? Smoking status: Former  ?  Packs/day: 0.50  ?  Types: Cigarettes  ?  Quit date: 04/25/2020  ?  Years since quitting: 1.2  ? Smokeless tobacco: Never  ?Substance Use Topics  ? Alcohol use: No  ? Drug use: No  ?  ?Objective: ?Vital signs in last 24 hours: ?Temp:  [97.3 ?F (36.3 ?C)-98.6 ?F (37 ?C)]  98.2 ?F (36.8 ?C) (03/20 1520) ?Pulse Rate:  [60-75] 62 (03/20 1520) ?Resp:  [16-20] 20 (03/20 1520) ?BP: (112-137)/(58-71) 112/58 (03/20 1520) ?SpO2:  [90 %-100 %] 100 % (03/20 1520) ?Physical Exam  ?Constitutional:  oriented to person, place, and time. appears well-developed and well-nourished. No distress.  ?HENT: [place]/AT, PERRLA, no scleral icterus ?Mouth/Throat: Oropharynx is clear and moist. No oropharyngeal exudate.  ?Cardiovascular: Normal rate, regular rhythm and normal heart sounds. Exam reveals no gallop and no friction rub.  ?No murmur heard.  ?Pulmonary/Chest: Effort normal and breath sounds with expiratory wheezes.  ?Neck = supple, no nuchal rigidity ?Abdominal: Soft. Bowel sounds are normal.  exhibits no distension. There is no tenderness.  ?Lymphadenopathy: no cervical adenopathy. No axillary adenopathy ?Neurological: alert and oriented to person, place, and time.  ?Skin: Skin is warm and dry. No rash noted. No erythema.  ?Psychiatric: a normal mood and affect.  behavior is normal.  ? ? ? ?Lab Results ?Recent Labs  ?  07/19/21 ?0719 07/20/21 ?6606  ?WBC 5.2 5.8  ?HGB 10.3* 10.2*  ?HCT 35.4* 34.3*  ?NA 140 138  ?K 4.6 4.6  ?CL 103 103  ?CO2 31 27  ?BUN 21 29*  ?CREATININE 0.83 0.68  ? ?Liver Panel ?No results for input(s): PROT, ALBUMIN, AST, ALT, ALKPHOS, BILITOT, BILIDIR, IBILI in the last 72 hours. ?Sedimentation Rate ?Recent Labs  ?  07/19/21 ?1619  ?ESRSEDRATE 32*  ? ?C-Reactive Protein ?Recent Labs  ?  07/19/21 ?1048  ?CRP 1.0*  ? ? ?Microbiology: ?RVP +rhinovirus ?Studies/Results: ?DG Chest 2 View ? ?Result Date: 07/19/2021 ?CLINICAL DATA:  Shortness of breath. EXAM: CHEST - 2 VIEW COMPARISON:  06/18/2021 FINDINGS: Stable cardiomediastinal contours. Mild diffuse increase interstitial markings. Chronic bilateral reticular and nodular interstitial opacities are also identified throughout both upper and lower lung zones. This is most severe in the left upper lobe. No superimposed lobar consolidation.  No superimposed atelectasis, lobar consolidation or pneumothorax. IMPRESSION: 1. 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 Electronically Signed   By: Signa Kell M.D.   On: 07/19/2021 07:53  ? ?CT CHEST WO CONTRAST ? ?Result Date: 07/19/2021 ?CLINICAL DATA:  Follow-up pneumonia identified on a CT scan from June 18, 2021. Shortness of breath. EXAM: CT CHEST WITHOUT CONTRAST TECHNIQUE: Multidetector CT imaging of the chest was performed following the standard protocol without IV contrast. RADIATION DOSE REDUCTION: This exam was performed according to the departmental dose-optimization program which includes automated exposure control, adjustment of the mA and/or kV according to patient size and/or use of iterative reconstruction technique. COMPARISON:  CT scan of the chest June 18, 2021. Chest x-ray July 19, 2021. FINDINGS: Cardiovascular: Calcified atherosclerosis is identified in the nonaneurysmal thoracic aorta. Central pulmonary arteries are normal in caliber. Three-vessel coronary artery disease is identified. Heart is unchanged and unremarkable. Mediastinum/Nodes: No pleural or pericardial effusions. A moderate to large hiatal hernia is identified. The thyroid is normal. The chest wall is normal. Stable adenopathy identified in the mediastinum. Lungs/Pleura: Central airways are stable. No pneumothorax. Widespread peribronchial nodular opacities remain in both lungs, left greater than right, stable. The acute infiltrate in the right middle and lower lobes on the previous study has resolved. Bronchial wall thickening again identified, stable. Upper Abdomen: No acute abnormality. Musculoskeletal: A nonacute compression fracture of T11 is stable. Degenerative changes identified in the thoracic spine. A healed proximal sternal fracture is stable. IMPRESSION: 1. The acute infiltrate in the right lung base on the June 18, 2021 CT scan of the chest has resolved. 2. The chronic nodular opacities and bronchial wall thickening consistent with the history of MAI is stable. 3. Stable moderate to large hiatal hernia. 4. Coronary artery disease. Calcified atherosclerosis in the thoracic aorta. 5. Stable compression fracture of T11. Aortic Atherosclerosis (ICD10-I70.0). Electronically Signed   By: Gerome Sam III M.D.   On: 07/19/2021 10:23   ? ? ?Assessment/Plan: ?Presumably COPD exacerbation by respiratory virus = recommend getting sputum culture for aerobic culture and afb cx. Hx of +fungitell - unclear significance from last month. Would consider having her getting bronchoscopy either here vs. Outpatient. Check ur ag for legionella and strep pneumonia. Can de-escalate as this information returns. ? ?Judyann Munson ?[facility] ?Pager: [phone] ? ?07/20/2021, 5:08 PM ? ? ? ? ? ?

## 2021-07-20 NOTE — Assessment & Plan Note (Signed)
Due to COPD exacerbation.  At baseline she uses 2 L and now she is on 4 L.  Wean oxygen as able.  Pulmonary following ?

## 2021-07-20 NOTE — Hospital Course (Signed)
61 y.o. female with medical history significant for COPD with chronic respiratory failure on 2 L of oxygen at night, nicotine dependence, depression and history of a ventral hernia admitted for acute on chronic hypoxic respiratory failure due to COPD exacerbation ? ?3/20: Requiring 4 L oxygen.  Pulmonary considering bronc ? ? ?

## 2021-07-21 DIAGNOSIS — F418 Other specified anxiety disorders: Secondary | ICD-10-CM | POA: Diagnosis not present

## 2021-07-21 DIAGNOSIS — J441 Chronic obstructive pulmonary disease with (acute) exacerbation: Secondary | ICD-10-CM | POA: Diagnosis not present

## 2021-07-21 DIAGNOSIS — J9621 Acute and chronic respiratory failure with hypoxia: Secondary | ICD-10-CM | POA: Diagnosis not present

## 2021-07-21 LAB — CBC
HCT: 34.9 % — ABNORMAL LOW (ref 36.0–46.0)
Hemoglobin: 10.2 g/dL — ABNORMAL LOW (ref 12.0–15.0)
MCH: 25.4 pg — ABNORMAL LOW (ref 26.0–34.0)
MCHC: 29.2 g/dL — ABNORMAL LOW (ref 30.0–36.0)
MCV: 87 fL (ref 80.0–100.0)
Platelets: 163 10*3/uL (ref 150–400)
RBC: 4.01 MIL/uL (ref 3.87–5.11)
RDW: 17.2 % — ABNORMAL HIGH (ref 11.5–15.5)
WBC: 8.2 10*3/uL (ref 4.0–10.5)
nRBC: 0 % (ref 0.0–0.2)

## 2021-07-21 LAB — BASIC METABOLIC PANEL
Anion gap: 6 (ref 5–15)
BUN: 31 mg/dL — ABNORMAL HIGH (ref 8–23)
CO2: 33 mmol/L — ABNORMAL HIGH (ref 22–32)
Calcium: 9.2 mg/dL (ref 8.9–10.3)
Chloride: 105 mmol/L (ref 98–111)
Creatinine, Ser: 0.81 mg/dL (ref 0.44–1.00)
GFR, Estimated: >60 mL/min (ref >60)
Glucose, Bld: 75 mg/dL (ref 70–99)
Potassium: 4.8 mmol/L (ref 3.5–5.1)
Sodium: 144 mmol/L (ref 135–145)

## 2021-07-21 LAB — LEGIONELLA PNEUMOPHILA TOTAL AB: Legionella Pneumo Total Ab: <0.91 OD ratio (ref 0.00–0.90)

## 2021-07-21 LAB — ACID FAST SMEAR (AFB, MYCOBACTERIA): Acid Fast Smear: NEGATIVE

## 2021-07-21 MED ORDER — PREDNISONE 10 MG (21) PO TBPK: ORAL_TABLET | ORAL | 0 refills | Status: DC

## 2021-07-21 MED ORDER — GUAIFENESIN 100 MG/5ML PO LIQD: 5.0000 mL | ORAL | 0 refills | Status: DC | PRN

## 2021-07-21 NOTE — Plan of Care (Signed)

## 2021-07-21 NOTE — Plan of Care (Signed)
?Problem: Education: ?Goal: Knowledge of disease or condition will improve ?07/21/2021 1135 by Vannary Greening, Jay Schlichter, RN ?Outcome: Adequate for Discharge ?07/21/2021 0928 by Carter Kitten, RN ?Outcome: Progressing ?Goal: Knowledge of the prescribed therapeutic regimen will improve ?07/21/2021 1135 by Izaya Netherton, Jay Schlichter, RN ?Outcome: Adequate for Discharge ?07/21/2021 0928 by Carter Kitten, RN ?Outcome: Progressing ?Goal: Individualized Educational Video(s) ?07/21/2021 1135 by Mykala Mccready, Jay Schlichter, RN ?Outcome: Adequate for Discharge ?07/21/2021 0928 by Carter Kitten, RN ?Outcome: Progressing ?  ?Problem: Activity: ?Goal: Ability to tolerate increased activity will improve ?07/21/2021 1135 by Alethea Terhaar, Jay Schlichter, RN ?Outcome: Adequate for Discharge ?07/21/2021 0928 by Carter Kitten, RN ?Outcome: Progressing ?Goal: Will verbalize the importance of balancing activity with adequate rest periods ?07/21/2021 1135 by Braelon Sprung, Jay Schlichter, RN ?Outcome: Adequate for Discharge ?07/21/2021 0928 by Carter Kitten, RN ?Outcome: Progressing ?  ?Problem: Respiratory: ?Goal: Ability to maintain a clear airway will improve ?07/21/2021 1135 by Reiana Poteet, Jay Schlichter, RN ?Outcome: Adequate for Discharge ?07/21/2021 0928 by Carter Kitten, RN ?Outcome: Progressing ?Goal: Levels of oxygenation will improve ?07/21/2021 1135 by Estevon Fluke, Jay Schlichter, RN ?Outcome: Adequate for Discharge ?07/21/2021 0928 by Carter Kitten, RN ?Outcome: Progressing ?Goal: Ability to maintain adequate ventilation will improve ?07/21/2021 1135 by Linda Grimmer, Jay Schlichter, RN ?Outcome: Adequate for Discharge ?07/21/2021 0928 by Carter Kitten, RN ?Outcome: Progressing ?  ?Problem: Activity: ?Goal: Ability to tolerate increased activity will improve ?07/21/2021 1135 by Darcel Frane, Jay Schlichter, RN ?Outcome: Adequate for Discharge ?07/21/2021 0928 by Carter Kitten, RN ?Outcome: Progressing ?  ?Problem: Clinical Measurements: ?Goal: Ability to maintain a body  temperature in the normal range will improve ?07/21/2021 1135 by Elvie Palomo, Jay Schlichter, RN ?Outcome: Adequate for Discharge ?07/21/2021 0928 by Carter Kitten, RN ?Outcome: Progressing ?  ?Problem: Respiratory: ?Goal: Ability to maintain adequate ventilation will improve ?07/21/2021 1135 by Rmani Kellogg, Jay Schlichter, RN ?Outcome: Adequate for Discharge ?07/21/2021 0928 by Carter Kitten, RN ?Outcome: Progressing ?Goal: Ability to maintain a clear airway will improve ?07/21/2021 1135 by Ventura Leggitt, Jay Schlichter, RN ?Outcome: Adequate for Discharge ?07/21/2021 0928 by Carter Kitten, RN ?Outcome: Progressing ?  ?Problem: Education: ?Goal: Knowledge of General Education information will improve ?Description: Including pain rating scale, medication(s)/side effects and non-pharmacologic comfort measures ?07/21/2021 1135 by Mustaf Antonacci, Jay Schlichter, RN ?Outcome: Adequate for Discharge ?07/21/2021 0928 by Carter Kitten, RN ?Outcome: Progressing ?  ?Problem: Health Behavior/Discharge Planning: ?Goal: Ability to manage health-related needs will improve ?07/21/2021 1135 by Jovanie Verge, Jay Schlichter, RN ?Outcome: Adequate for Discharge ?07/21/2021 0928 by Carter Kitten, RN ?Outcome: Progressing ?  ?Problem: Clinical Measurements: ?Goal: Ability to maintain clinical measurements within normal limits will improve ?07/21/2021 1135 by Dominic Mahaney, Jay Schlichter, RN ?Outcome: Adequate for Discharge ?07/21/2021 0928 by Carter Kitten, RN ?Outcome: Progressing ?Goal: Will remain free from infection ?07/21/2021 1135 by Reona Zendejas, Jay Schlichter, RN ?Outcome: Adequate for Discharge ?07/21/2021 0928 by Carter Kitten, RN ?Outcome: Progressing ?Goal: Diagnostic test results will improve ?07/21/2021 1135 by Quantisha Marsicano, Jay Schlichter, RN ?Outcome: Adequate for Discharge ?07/21/2021 0928 by Carter Kitten, RN ?Outcome: Progressing ?Goal: Respiratory complications will improve ?07/21/2021 1135 by Pernell Dikes, Jay Schlichter, RN ?Outcome: Adequate for Discharge ?07/21/2021 0928 by  Carter Kitten, RN ?Outcome: Progressing ?Goal: Cardiovascular complication will be avoided ?07/21/2021 1135 by Jazzmon Prindle, Jay Schlichter, RN ?Outcome: Adequate for Discharge ?07/21/2021 0928 by Carter Kitten, RN ?Outcome: Progressing ?  ?Problem: Activity: ?Goal: Risk for activity intolerance will decrease ?07/21/2021 1135 by Baleigh Rennaker, Jay Schlichter,  RN ?Outcome: Adequate for Discharge ?07/21/2021 0928 by Carter Kitten, RN ?Outcome: Progressing ?  ?Problem: Nutrition: ?Goal: Adequate nutrition will be maintained ?07/21/2021 1135 by Anallely Rosell, Jay Schlichter, RN ?Outcome: Adequate for Discharge ?07/21/2021 0928 by Carter Kitten, RN ?Outcome: Progressing ?  ?Problem: Coping: ?Goal: Level of anxiety will decrease ?07/21/2021 1135 by Tell Rozelle, Jay Schlichter, RN ?Outcome: Adequate for Discharge ?07/21/2021 0928 by Carter Kitten, RN ?Outcome: Progressing ?  ?Problem: Elimination: ?Goal: Will not experience complications related to bowel motility ?07/21/2021 1135 by Shonta Phillis, Jay Schlichter, RN ?Outcome: Adequate for Discharge ?07/21/2021 0928 by Carter Kitten, RN ?Outcome: Progressing ?Goal: Will not experience complications related to urinary retention ?07/21/2021 1135 by Wladyslaw Henrichs, Jay Schlichter, RN ?Outcome: Adequate for Discharge ?07/21/2021 0928 by Carter Kitten, RN ?Outcome: Progressing ?  ?Problem: Pain Managment: ?Goal: General experience of comfort will improve ?07/21/2021 1135 by Alizee Maple, Jay Schlichter, RN ?Outcome: Adequate for Discharge ?07/21/2021 0928 by Carter Kitten, RN ?Outcome: Progressing ?  ?Problem: Safety: ?Goal: Ability to remain free from injury will improve ?07/21/2021 1135 by Sanela Evola, Jay Schlichter, RN ?Outcome: Adequate for Discharge ?07/21/2021 0928 by Carter Kitten, RN ?Outcome: Progressing ?  ?Problem: Skin Integrity: ?Goal: Risk for impaired skin integrity will decrease ?07/21/2021 1135 by Aliz Meritt, Jay Schlichter, RN ?Outcome: Adequate for Discharge ?07/21/2021 0928 by Carter Kitten, RN ?Outcome:  Progressing ?  ?

## 2021-07-21 NOTE — Progress Notes (Signed)
? ? ? ? ? ? ?PULMONOLOGY ? ? ? ? ? ? ? ? ?Date: 07/21/2021,   ?MRN# [id number] Julia [name] May 27, 1960 ? ? ?  ?AdmissionWeight: 57.6 kg                 ?CurrentWeight: 57.6 kg ? ? ?Referring physician: Dr Sherryll BurgerShah ? ? ?CHIEF COMPLAINT:  ? ?Right lower lobe infiltrate  ? ? ?HISTORY OF PRESENT ILLNESS  ? ?61 yo F with hx of COPD chronic hypoxemia, MDD, nicotine use , Hiatal and ventral hernia who presents to ER for sob. She was found to have acute on chronic hypoxemia and required 3L/min of oxygen supplementally.  She received solumedrol and antibiotics on arrival with improvement. She also had CT chest which I reviewed and there are multifocal infiltrates improved from previous specifically interval improvement of RLL infiltrates consistent with resolution of pneumonia at this lung zone.  She is ok during interview and states she can provide resp cultures to us for microbiology. She came in due to not fully recovering clinically from previous hospitalization when diagnosed with pneumonia of RLL. She continues to vape and smoke cigarretes but denies illicit drug use or alcoholism. She is not having hemoptysis but does endorse high volume thickened phlegm with darker discoloration consistent with AECOPD.  ? ?07/21/21-  patient improving clinically.  She had ID evaluation yesterday.  Drug screen + for MJ and Benzo.  Microbiology negative for AFB and bacterial workup with few more tests in process. Lung auscultation improved. Fungal workup in process but to date nothing has grown +. She can have outpatient follow up to investigate further any ongoing infection or fungal involvement.  She is cleared to dc home with outpatient follow up.  ? ?PAST MEDICAL HISTORY  ? ?Past Medical History:  ?Diagnosis Date  ?? Anemia   ?? Anxiety   ?? Asthma   ?? COPD (chronic obstructive pulmonary disease) (HCC)   ?? Depression   ?? Ventral hernia   ? ? ? ?SURGICAL HISTORY  ? ?Past Surgical History:  ?Procedure Laterality Date  ?? COLON  SURGERY    ?? EAR TUBE REMOVAL    ?? FRACTURE SURGERY    ?? HERNIA REPAIR    ?? TUBAL LIGATION    ? ? ? ?FAMILY HISTORY  ? ?Family History  ?Problem Relation Age of Onset  ?? Sarcoidosis Mother   ?? Alcohol abuse Father   ? ? ? ?SOCIAL HISTORY  ? ?Social History  ? ?Tobacco Use  ?? Smoking status: Former  ?  Packs/day: 0.50  ?  Types: Cigarettes  ?  Quit date: 04/25/2020  ?  Years since quitting: 1.2  ?? Smokeless tobacco: Never  ?Substance Use Topics  ?? Alcohol use: No  ?? Drug use: No  ? ? ? ?MEDICATIONS  ? ? ?Home Medication:  ?  ?Current Medication: ? ?Current Facility-Administered Medications:  ??  0.9 %  sodium chloride infusion, 250 mL, Intravenous, PRN, Agbata, Tochukwu, MD ??  acetaminophen (TYLENOL) tablet 650 mg, 650 mg, Oral, Q6H PRN, 650 mg at 07/19/21 1814 **OR** acetaminophen (TYLENOL) suppository 650 mg, 650 mg, Rectal, Q6H PRN, Agbata, Tochukwu, MD ??  albuterol (PROVENTIL) (2.5 MG/3ML) 0.083% nebulizer solution 2.5 mg, 2.5 mg, Nebulization, Q6H PRN, Agbata, Tochukwu, MD, 2.5 mg at 07/20/21 1954 ??  ALPRAZolam (XANAX) tablet 0.25-0.5 mg, 0.25-0.5 mg, Oral, BID PRN, Agbata, Tochukwu, MD, 0.5 mg at 07/21/21 0908 ??  azithromycin (ZITHROMAX) 500 mg in sodium chloride 0.9 % 250 mL IVPB, 500  mg, Intravenous, Q24H, Agbata, Tochukwu, MD, Last Rate: 250 mL/hr at 07/21/21 1048, 500 mg at 07/21/21 1048 ??  cefTRIAXone (ROCEPHIN) 2 g in sodium chloride 0.9 % 100 mL IVPB, 2 g, Intravenous, Q24H, Agbata, Tochukwu, MD, Last Rate: 200 mL/hr at 07/21/21 0910, 2 g at 07/21/21 0910 ??  enoxaparin (LOVENOX) injection 40 mg, 40 mg, Subcutaneous, Q24H, Agbata, Tochukwu, MD, 40 mg at 07/20/21 2028 ??  ferrous sulfate tablet 650 mg, 650 mg, Oral, Q breakfast, Agbata, Tochukwu, MD, 650 mg at 07/21/21 0909 ??  fluticasone furoate-vilanterol (BREO ELLIPTA) 100-25 MCG/ACT 1 puff, 1 puff, Inhalation, Daily, 1 puff at 07/21/21 0907 **AND** umeclidinium bromide (INCRUSE ELLIPTA) 62.5 MCG/ACT 1 puff, 1 puff, Inhalation, Daily,  Agbata, Tochukwu, MD, 1 puff at 07/21/21 0907 ??  gabapentin (NEURONTIN) capsule 300 mg, 300 mg, Oral, TID, Agbata, Tochukwu, MD, 300 mg at 07/21/21 0909 ??  guaiFENesin (ROBITUSSIN) 100 MG/5ML liquid 5 mL, 5 mL, Oral, Q4H PRN, Foust, Katy L, NP, 5 mL at 07/20/21 2138 ??  ondansetron (ZOFRAN) tablet 4 mg, 4 mg, Oral, Q6H PRN **OR** ondansetron (ZOFRAN) injection 4 mg, 4 mg, Intravenous, Q6H PRN, Agbata, Tochukwu, MD ??  pantoprazole (PROTONIX) EC tablet 40 mg, 40 mg, Oral, Daily, Agbata, Tochukwu, MD, 40 mg at 07/21/21 0909 ??  [COMPLETED] methylPREDNISolone sodium succinate (SOLU-MEDROL) 40 mg/mL injection 40 mg, 40 mg, Intravenous, Q12H, 40 mg at 07/20/21 0542 **FOLLOWED BY** predniSONE (DELTASONE) tablet 40 mg, 40 mg, Oral, Q breakfast, Agbata, Tochukwu, MD, 40 mg at 07/21/21 0910 ??  sertraline (ZOLOFT) tablet 150 mg, 150 mg, Oral, Daily, Agbata, Tochukwu, MD, 150 mg at 07/20/21 0914 ??  sodium chloride flush (NS) 0.9 % injection 3 mL, 3 mL, Intravenous, Q12H, Agbata, Tochukwu, MD, 3 mL at 07/21/21 0916 ??  sodium chloride flush (NS) 0.9 % injection 3 mL, 3 mL, Intravenous, PRN, Agbata, Tochukwu, MD ? ? ? ?ALLERGIES  ? ?Banana ? ? ? ? ?REVIEW OF SYSTEMS  ? ? ?Review of Systems: ? ?Gen:  Denies  fever, sweats, chills weigh loss  ?HEENT: Denies blurred vision, double vision, ear pain, eye pain, hearing loss, nose bleeds, sore throat ?Cardiac:  No dizziness, chest pain or heaviness, chest tightness,edema ?Resp:   Denies cough or sputum porduction, shortness of breath,wheezing, hemoptysis,  ?Gi: Denies swallowing difficulty, stomach pain, nausea or vomiting, diarrhea, constipation, bowel incontinence ?Gu:  Denies bladder incontinence, burning urine ?Ext:   Denies Joint pain, stiffness or swelling ?Skin: Denies  skin rash, easy bruising or bleeding or hives ?Endoc:  Denies polyuria, polydipsia , polyphagia or weight change ?Psych:   Denies depression, insomnia or hallucinations  ? ?Other:  All other systems  negative ? ? ?VS: BP 102/60   Pulse (!) 57   Temp 98.1 ?F (36.7 ?C)   Resp 18   Ht 5\' 5"  (1.651 m)   Wt 57.6 kg   SpO2 100%   BMI 21.13 kg/m?   ? ? ? ?PHYSICAL EXAM  ? ? ?GENERAL:NAD, no fevers, chills, no weakness no fatigue ?HEAD: Normocephalic, atraumatic.  ?EYES: Pupils equal, round, reactive to light. Extraocular muscles intact. No scleral icterus.  ?MOUTH: Moist mucosal membrane. Dentition intact. No abscess noted.  ?EAR, NOSE, THROAT: Clear without exudates. No external lesions.  ?NECK: Supple. No thyromegaly. No nodules. No JVD.  ?PULMONARY: Diffuse coarse rhonchi right sided +wheezes ?CARDIOVASCULAR: S1 and S2. Regular rate and rhythm. No murmurs, rubs, or gallops. No edema. Pedal pulses 2+ bilaterally.  ?GASTROINTESTINAL: Soft, nontender, nondistended. No masses. Positive bowel sounds.  No hepatosplenomegaly.  ?MUSCULOSKELETAL: No swelling, clubbing, or edema. Range of motion full in all extremities.  ?NEUROLOGIC: Cranial nerves II through XII are intact. No gross focal neurological deficits. Sensation intact. Reflexes intact.  ?SKIN: No ulceration, lesions, rashes, or cyanosis. Skin warm and dry. Turgor intact.  ?PSYCHIATRIC: Mood, affect within normal limits. The patient is awake, alert and oriented x 3. Insight, judgment intact.  ? ? ?  ? ?IMAGING  ? ? ?DG Chest 2 View ? ?Result Date: 07/19/2021 ?CLINICAL DATA:  Shortness of breath. EXAM: CHEST - 2 VIEW COMPARISON:  06/18/2021 FINDINGS: Stable cardiomediastinal contours. Mild diffuse increase interstitial markings. Chronic bilateral reticular and nodular interstitial opacities are also identified throughout both upper and lower lung zones. This is most severe in the left upper lobe. No superimposed lobar consolidation. No superimposed atelectasis, lobar consolidation or pneumothorax. IMPRESSION: 1. 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 Electronically Signed   By:  Signa Kell M.D.   On: 07/19/2021 07:53  ? ?CT CHEST WO CONTRAST ? ?Result Date: 07/19/2021 ?CLINICAL DATA:  Follow-up pneumonia identified on a CT scan from June 18, 2021. Shortness of breath. EXAM: CT CHEST WITHOUT

## 2021-07-21 NOTE — Care Management (Signed)
?  Transition of Care (TOC) Screening Note ? ? ?Patient Details  ?Name: Julia [name] ?Date of Birth: February 01, 1961 ? ? ?Transition of Care (TOC) CM/SW Contact:    ?Caryn Section, RN ?Phone Number: ?07/21/2021, 9:56 AM ? ? ? ?Transition of Care Department [facility]) has reviewed patient and no TOC needs have been identified at this time. We will continue to monitor patient advancement through interdisciplinary progression rounds. If new patient transition needs arise, please place a TOC consult. ?  ?

## 2021-07-23 LAB — HISTOPLASMA GAL'MANNAN AG SER: Histoplasma Gal'mannan Ag Ser: <0.5 (ref <0.5)

## 2021-07-23 LAB — ASPERGILLUS ANTIGEN, BAL/SERUM: Aspergillus Ag, BAL/Serum: 0.06 Index (ref 0.00–0.49)

## 2021-07-23 NOTE — Discharge Summary (Signed)
?Physician Discharge Summary ?  ?Patient: Julia [name] MRN: [id number] DOB: 08-11-1960  ?Admit date:     07/19/2021  ?Discharge date: 07/21/2021  ?Discharge Physician: Delfino Lovett  ? ?PCP: Olena Leatherwood, FNP  ? ?Recommendations at discharge:  ? ? F/up with outpt providers as requested ? ?Discharge Diagnoses: ?Principal Problem: ?  Acute on chronic respiratory failure with hypoxia (HCC) ?Active Problems: ?  Depression with anxiety ?  COPD with acute exacerbation (HCC) ?  Nicotine dependence ? ? ?Hospital Course: ?61 y.o. female with medical history significant for COPD with chronic respiratory failure on 2 L of oxygen at night, nicotine dependence, depression and history of a ventral hernia admitted for acute on chronic hypoxic respiratory failure due to COPD exacerbation ? ?3/20: Requiring 4 L oxygen.   ? ?Assessment and Plan: ?* Acute on chronic respiratory failure with hypoxia (HCC) ?Due to COPD exacerbation. Improved with treatment and at her baseline 2 liter O2 at DC ? ?Nicotine dependence ?Smoking cessation has been discussed with patient in detail ?She declines a nicotine transdermal patch. ? ?COPD with acute exacerbation (HCC) ?Likely due to Rhino/Enterovirus (+ in respi panel) ?Improved with steroids,nebs as well as antitussives ?CT scan of the chest concerning for chronic nodular inflammatory lung disease suggestive of underlying MAI. ?Fungitell trending up from 86->126 (11/22-2/23) ?Outpt Pulmo f/up with Dr Karna Christmas ? ?Depression with anxiety ?Continue Zoloft and alprazolam. ? ? ? ? ?  ? ? ?Consultants: Pulmo & ID ?Disposition: Home ?Diet recommendation:  ?Discharge Diet Orders (From admission, onward)  ? ?  Start     Ordered  ? 07/21/21 0000  Diet - low sodium heart healthy       ? 07/21/21 0856  ? ?  ?  ? ?  ? ?Carb modified diet ?DISCHARGE MEDICATION: ?Allergies as of 07/21/2021   ? ?   Reactions  ? Banana Nausea And Vomiting  ? ?  ? ?  ?Medication List  ?  ? ?STOP taking these medications    ? ?ferrous sulfate 325 (65 FE) MG tablet ?  ?Potassium 99 MG Tabs ?  ? ?  ? ?TAKE these medications   ? ?acetaminophen 500 MG tablet ?Commonly known as: TYLENOL ?Take 1,000 mg by mouth every 6 (six) hours as needed. ?  ?albuterol (2.5 MG/3ML) 0.083% nebulizer solution ?Commonly known as: PROVENTIL ?Take 2.5 mg by nebulization every 6 (six) hours as needed for wheezing or shortness of breath. ?  ?albuterol 108 (90 Base) MCG/ACT inhaler ?Commonly known as: VENTOLIN HFA ?Inhale 2 puffs into the lungs every 6 (six) hours as needed for wheezing or shortness of breath. ?  ?ALPRAZolam 0.5 MG tablet ?Commonly known as: Prudy Feeler ?Take 0.5-1 mg by mouth 2 (two) times daily as needed for anxiety. ?  ?dextromethorphan-guaiFENesin 30-600 MG 12hr tablet ?Commonly known as: MUCINEX DM ?Take 1 tablet by mouth 2 (two) times daily. ?  ?docusate sodium 100 MG capsule ?Commonly known as: COLACE ?Take 100 mg by mouth daily as needed for mild constipation. ?  ?gabapentin 300 MG capsule ?Commonly known as: NEURONTIN ?Take 300 mg by mouth 3 (three) times daily. ?  ?guaiFENesin 100 MG/5ML liquid ?Commonly known as: ROBITUSSIN ?Take 5 mLs by mouth every 4 (four) hours as needed for cough or to loosen phlegm. ?  ?magnesium oxide 400 MG tablet ?Commonly known as: MAG-OX ?Take 400 mg by mouth daily. ?  ?pantoprazole 40 MG tablet ?Commonly known as: PROTONIX ?Take 40 mg by mouth daily. ?  ?predniSONE 10  MG (21) Tbpk tablet ?Commonly known as: STERAPRED UNI-PAK 21 TAB ?Start 60 mg po daily, taper 10 mg daily until finish ?  ?sertraline 100 MG tablet ?Commonly known as: ZOLOFT ?Take 150 mg by mouth daily. ?  ?Trelegy Ellipta 100-62.5-25 MCG/ACT Aepb ?Generic drug: Fluticasone-Umeclidin-Vilant ?Inhale 1 puff into the lungs daily. ?  ? ?  ? ? Follow-up Information   ? ? Olena Leatherwood, FNP. Schedule an appointment as soon as possible for a visit in 2 day(s).   ?Specialty: Nurse Practitioner ?Why: [facility] Discharge F/UP ?Contact  information: ?[address] [place] [address] ?[phone] ? ? ?  ?  ? ? Vida Rigger, MD. Nyra Capes on 07/28/2021.   ?Specialty: Pulmonary Disease ?Why: @10 :45am ?Contact information: ?[address] ?[place] [address] [place] ?[phone] ? ? ?  ?  ? ?  ?  ? ?  ? ?Discharge Exam: ?Filed Weights  ? 07/19/21 0639  ?Weight: 57.30 kg  ? ?61 year old female lying in the bed in no acute distress ?Eyes pupil equal round reactive to light and accommodation, no scleral icterus ?Lungs: CTA  b/l, no rales, wheezing ?Cardiovascular s1,s2 normal, no murmur ?Abdomen soft, benign ?Neuro alert and oriented, nonfocal ?Skin no rash or lesion ? ?Condition at discharge: fair ? ?The results of significant diagnostics from this hospitalization (including imaging, microbiology, ancillary and laboratory) are listed below for reference.  ? ?Imaging Studies: ?DG Chest 2 View ? ?Result Date: 07/19/2021 ?CLINICAL DATA:  Shortness of breath. EXAM: CHEST - 2 VIEW COMPARISON:  06/18/2021 FINDINGS: Stable cardiomediastinal contours. Mild diffuse increase interstitial markings. Chronic bilateral reticular and nodular interstitial opacities are also identified throughout both upper and lower lung zones. This is most severe in the left upper lobe. No superimposed lobar consolidation. No superimposed atelectasis, lobar consolidation or pneumothorax. IMPRESSION: 1. 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 Electronically Signed   By: 06/20/2021 M.D.   On: 07/19/2021 07:53  ? ?CT CHEST WO CONTRAST ? ?Result Date: 07/19/2021 ?CLINICAL DATA:  Follow-up pneumonia identified on a CT scan from June 18, 2021. Shortness of breath. EXAM: CT CHEST WITHOUT CONTRAST TECHNIQUE: Multidetector CT imaging of the chest was performed following the standard protocol without IV contrast. RADIATION DOSE REDUCTION: This exam was performed according to the departmental dose-optimization  program which includes automated exposure control, adjustment of the mA and/or kV according to patient size and/or use of iterative reconstruction technique. COMPARISON:  CT scan of the chest June 18, 2021. Chest x-ray July 19, 2021. FINDINGS: Cardiovascular: Calcified atherosclerosis is identified in the nonaneurysmal thoracic aorta. Central pulmonary arteries are normal in caliber. Three-vessel coronary artery disease is identified. Heart is unchanged and unremarkable. Mediastinum/Nodes: No pleural or pericardial effusions. A moderate to large hiatal hernia is identified. The thyroid is normal. The chest wall is normal. Stable adenopathy identified in the mediastinum. Lungs/Pleura: Central airways are stable. No pneumothorax. Widespread peribronchial nodular opacities remain in both lungs, left greater than right, stable. The acute infiltrate in the right middle and lower lobes on the previous study has resolved. Bronchial wall thickening again identified, stable. Upper Abdomen: No acute abnormality. Musculoskeletal: A nonacute compression fracture of T11 is stable. Degenerative changes identified in the thoracic spine. A healed proximal sternal fracture is stable. IMPRESSION: 1. The acute infiltrate in the right lung base on the June 18, 2021 CT scan of the chest has resolved. 2. The chronic nodular opacities and bronchial wall thickening consistent  with the history of MAI is stable. 3. Stable moderate to large hiatal hernia. 4. Coronary artery disease. Calcified atherosclerosis in the thoracic aorta. 5. Stable compression fracture of T11. Aortic Atherosclerosis (ICD10-I70.0). Electronically Signed   By: Gerome Samavid  Williams III M.D.   On: 07/19/2021 10:23   ? ?Microbiology: ?Results for orders placed or performed during the hospital encounter of 07/19/21  ?Resp Panel by RT-PCR (Flu A&B, Covid) Nasopharyngeal Swab     Status: None  ? Collection Time: 07/19/21  7:18 AM  ? Specimen: Nasopharyngeal Swab;  Nasopharyngeal(NP) swabs in vial transport medium  ?Result Value Ref Range Status  ? SARS Coronavirus 2 by RT PCR NEGATIVE NEGATIVE Final  ?  Comment: (NOTE) ?SARS-CoV-2 target nucleic acids are NOT DETECTED. ? ?The SARS-CoV-2 RN

## 2021-09-01 LAB — ACID FAST CULTURE WITH REFLEXED SENSITIVITIES (MYCOBACTERIA): Acid Fast Culture: NEGATIVE

## 2021-09-23 ENCOUNTER — Emergency Department: Payer: 59

## 2021-09-23 ENCOUNTER — Other Ambulatory Visit: Payer: Self-pay

## 2021-09-23 ENCOUNTER — Inpatient Hospital Stay
Admission: EM | Admit: 2021-09-23 | Discharge: 2021-10-05 | DRG: 871 | Disposition: A | Discharge status: Home / Self Care | Payer: 59 | Attending: Internal Medicine | Admitting: Internal Medicine

## 2021-09-23 DIAGNOSIS — G8929 Other chronic pain: Secondary | ICD-10-CM | POA: Diagnosis present

## 2021-09-23 DIAGNOSIS — G928 Other toxic encephalopathy: Secondary | ICD-10-CM | POA: Diagnosis present

## 2021-09-23 DIAGNOSIS — R6520 Severe sepsis without septic shock: Secondary | ICD-10-CM

## 2021-09-23 DIAGNOSIS — Z7951 Long term (current) use of inhaled steroids: Secondary | ICD-10-CM

## 2021-09-23 DIAGNOSIS — Z91018 Allergy to other foods: Secondary | ICD-10-CM | POA: Diagnosis not present

## 2021-09-23 DIAGNOSIS — A419 Sepsis, unspecified organism: Secondary | ICD-10-CM | POA: Diagnosis present

## 2021-09-23 DIAGNOSIS — N179 Acute kidney failure, unspecified: Secondary | ICD-10-CM

## 2021-09-23 DIAGNOSIS — F419 Anxiety disorder, unspecified: Secondary | ICD-10-CM | POA: Diagnosis present

## 2021-09-23 DIAGNOSIS — Z22322 Carrier or suspected carrier of Methicillin resistant Staphylococcus aureus: Secondary | ICD-10-CM

## 2021-09-23 DIAGNOSIS — R5381 Other malaise: Secondary | ICD-10-CM | POA: Diagnosis present

## 2021-09-23 DIAGNOSIS — J9601 Acute respiratory failure with hypoxia: Principal | ICD-10-CM

## 2021-09-23 DIAGNOSIS — J13 Pneumonia due to Streptococcus pneumoniae: Secondary | ICD-10-CM | POA: Diagnosis present

## 2021-09-23 DIAGNOSIS — F32A Depression, unspecified: Secondary | ICD-10-CM | POA: Diagnosis present

## 2021-09-23 DIAGNOSIS — Z20822 Contact with and (suspected) exposure to covid-19: Secondary | ICD-10-CM | POA: Diagnosis present

## 2021-09-23 DIAGNOSIS — R911 Solitary pulmonary nodule: Secondary | ICD-10-CM | POA: Diagnosis present

## 2021-09-23 DIAGNOSIS — J441 Chronic obstructive pulmonary disease with (acute) exacerbation: Secondary | ICD-10-CM | POA: Diagnosis present

## 2021-09-23 DIAGNOSIS — D509 Iron deficiency anemia, unspecified: Secondary | ICD-10-CM | POA: Diagnosis present

## 2021-09-23 DIAGNOSIS — J189 Pneumonia, unspecified organism: Secondary | ICD-10-CM | POA: Diagnosis present

## 2021-09-23 DIAGNOSIS — A403 Sepsis due to Streptococcus pneumoniae: Secondary | ICD-10-CM | POA: Diagnosis not present

## 2021-09-23 DIAGNOSIS — J9622 Acute and chronic respiratory failure with hypercapnia: Secondary | ICD-10-CM | POA: Diagnosis present

## 2021-09-23 DIAGNOSIS — J9621 Acute and chronic respiratory failure with hypoxia: Secondary | ICD-10-CM | POA: Diagnosis present

## 2021-09-23 DIAGNOSIS — Z79899 Other long term (current) drug therapy: Secondary | ICD-10-CM | POA: Diagnosis not present

## 2021-09-23 DIAGNOSIS — Z87891 Personal history of nicotine dependence: Secondary | ICD-10-CM | POA: Diagnosis not present

## 2021-09-23 DIAGNOSIS — G4733 Obstructive sleep apnea (adult) (pediatric): Secondary | ICD-10-CM | POA: Diagnosis present

## 2021-09-23 DIAGNOSIS — J440 Chronic obstructive pulmonary disease with acute lower respiratory infection: Secondary | ICD-10-CM | POA: Diagnosis present

## 2021-09-23 DIAGNOSIS — J154 Pneumonia due to other streptococci: Secondary | ICD-10-CM

## 2021-09-23 DIAGNOSIS — J810 Acute pulmonary edema: Secondary | ICD-10-CM

## 2021-09-23 HISTORY — DX: Pneumonia, unspecified organism: J18.9

## 2021-09-23 LAB — LACTIC ACID, PLASMA
Lactic Acid, Venous: 4.8 mmol/L (ref 0.5–1.9)
Lactic Acid, Venous: 1.8 mmol/L (ref 0.5–1.9)
Lactic Acid, Venous: 1.1 mmol/L (ref 0.5–1.9)

## 2021-09-23 LAB — COMPREHENSIVE METABOLIC PANEL
ALT: 14 U/L (ref 0–44)
AST: 33 U/L (ref 15–41)
Albumin: 3.8 g/dL (ref 3.5–5.0)
Alkaline Phosphatase: 85 U/L (ref 38–126)
Anion gap: 19 — ABNORMAL HIGH (ref 5–15)
BUN: 36 mg/dL — ABNORMAL HIGH (ref 8–23)
CO2: 20 mmol/L — ABNORMAL LOW (ref 22–32)
Calcium: 9.7 mg/dL (ref 8.9–10.3)
Chloride: 99 mmol/L (ref 98–111)
Creatinine, Ser: 1.84 mg/dL — ABNORMAL HIGH (ref 0.44–1.00)
GFR, Estimated: 31 mL/min — ABNORMAL LOW (ref >60)
Glucose, Bld: 192 mg/dL — ABNORMAL HIGH (ref 70–99)
Potassium: 4.3 mmol/L (ref 3.5–5.1)
Sodium: 138 mmol/L (ref 135–145)
Total Bilirubin: 0.5 mg/dL (ref 0.3–1.2)
Total Protein: 8.1 g/dL (ref 6.5–8.1)

## 2021-09-23 LAB — CBC WITH DIFFERENTIAL/PLATELET
Abs Immature Granulocytes: 2.28 10*3/uL — ABNORMAL HIGH (ref 0.00–0.07)
Basophils Absolute: 0.1 10*3/uL (ref 0.0–0.1)
Basophils Relative: 1 %
Eosinophils Absolute: 0 10*3/uL (ref 0.0–0.5)
Eosinophils Relative: 0 %
HCT: 38.9 % (ref 36.0–46.0)
Hemoglobin: 11.5 g/dL — ABNORMAL LOW (ref 12.0–15.0)
Immature Granulocytes: 10 %
Lymphocytes Relative: 2 %
Lymphs Abs: 0.4 10*3/uL — ABNORMAL LOW (ref 0.7–4.0)
MCH: 23.9 pg — ABNORMAL LOW (ref 26.0–34.0)
MCHC: 29.6 g/dL — ABNORMAL LOW (ref 30.0–36.0)
MCV: 80.9 fL (ref 80.0–100.0)
Monocytes Absolute: 0.4 10*3/uL (ref 0.1–1.0)
Monocytes Relative: 2 %
Neutro Abs: 20.2 10*3/uL — ABNORMAL HIGH (ref 1.7–7.7)
Neutrophils Relative %: 85 %
Platelets: 191 10*3/uL (ref 150–400)
RBC: 4.81 MIL/uL (ref 3.87–5.11)
RDW: 17.5 % — ABNORMAL HIGH (ref 11.5–15.5)
Smear Review: NORMAL
WBC: 23.4 10*3/uL — ABNORMAL HIGH (ref 4.0–10.5)
nRBC: 0 % (ref 0.0–0.2)

## 2021-09-23 LAB — URINALYSIS, COMPLETE (UACMP) WITH MICROSCOPIC
Bacteria, UA: NONE SEEN
Bilirubin Urine: NEGATIVE
Glucose, UA: 50 mg/dL — AB
Hgb urine dipstick: NEGATIVE
Ketones, ur: NEGATIVE mg/dL
Leukocytes,Ua: NEGATIVE
Nitrite: NEGATIVE
Protein, ur: NEGATIVE mg/dL
Specific Gravity, Urine: 1.023 (ref 1.005–1.030)
pH: 5 (ref 5.0–8.0)

## 2021-09-23 LAB — MRSA NEXT GEN BY PCR, NASAL: MRSA by PCR Next Gen: DETECTED — AB

## 2021-09-23 MED ORDER — ENOXAPARIN SODIUM 40 MG/0.4ML IJ SOSY
40.0000 mg | PREFILLED_SYRINGE | INTRAMUSCULAR | Status: DC
  Administered 2021-09-23 – 2021-10-04 (×12): 40 mg via SUBCUTANEOUS
  Filled 2021-09-23 (×12): qty 0.4

## 2021-09-23 MED ORDER — SODIUM CHLORIDE 0.9 % IV SOLN
500.0000 mg | Freq: Once | INTRAVENOUS | Status: AC
  Administered 2021-09-23: 500 mg via INTRAVENOUS
  Filled 2021-09-23: qty 5

## 2021-09-23 MED ORDER — DM-GUAIFENESIN ER 30-600 MG PO TB12
1.0000 | ORAL_TABLET | Freq: Two times a day (BID) | ORAL | Status: DC
  Administered 2021-09-24 – 2021-10-05 (×23): 1 via ORAL
  Filled 2021-09-23 (×23): qty 1

## 2021-09-23 MED ORDER — ONDANSETRON HCL 4 MG/2ML IJ SOLN
4.0000 mg | Freq: Once | INTRAMUSCULAR | Status: AC
  Administered 2021-09-23: 4 mg via INTRAVENOUS
  Filled 2021-09-23: qty 2

## 2021-09-23 MED ORDER — LACTATED RINGERS IV SOLN: INTRAVENOUS | Status: DC

## 2021-09-23 MED ORDER — PANTOPRAZOLE SODIUM 40 MG PO TBEC
40.0000 mg | DELAYED_RELEASE_TABLET | Freq: Every day | ORAL | Status: DC
  Administered 2021-09-24 – 2021-10-05 (×12): 40 mg via ORAL
  Filled 2021-09-23 (×12): qty 1

## 2021-09-23 MED ORDER — IPRATROPIUM-ALBUTEROL 0.5-2.5 (3) MG/3ML IN SOLN
3.0000 mL | Freq: Once | RESPIRATORY_TRACT | Status: AC
  Administered 2021-09-23: 3 mL via RESPIRATORY_TRACT
  Filled 2021-09-23: qty 3

## 2021-09-23 MED ORDER — IOHEXOL 350 MG/ML SOLN
40.0000 mL | Freq: Once | INTRAVENOUS | Status: AC | PRN
  Administered 2021-09-23: 40 mL via INTRAVENOUS

## 2021-09-23 MED ORDER — MORPHINE SULFATE (PF) 4 MG/ML IV SOLN
4.0000 mg | INTRAVENOUS | Status: DC | PRN
  Administered 2021-09-23 – 2021-09-24 (×4): 4 mg via INTRAVENOUS
  Filled 2021-09-23 (×4): qty 1

## 2021-09-23 MED ORDER — IPRATROPIUM-ALBUTEROL 0.5-2.5 (3) MG/3ML IN SOLN
3.0000 mL | Freq: Four times a day (QID) | RESPIRATORY_TRACT | Status: DC | PRN
  Administered 2021-09-27: 3 mL via RESPIRATORY_TRACT

## 2021-09-23 MED ORDER — SODIUM CHLORIDE 0.9 % IV BOLUS
1000.0000 mL | Freq: Once | INTRAVENOUS | Status: AC
  Administered 2021-09-23: 1000 mL via INTRAVENOUS

## 2021-09-23 MED ORDER — FLUTICASONE FUROATE-VILANTEROL 100-25 MCG/ACT IN AEPB
1.0000 | INHALATION_SPRAY | Freq: Every day | RESPIRATORY_TRACT | Status: DC
Start: 2021-09-24 — End: 2021-09-24
  Administered 2021-09-24: 1 via RESPIRATORY_TRACT
  Filled 2021-09-23: qty 28

## 2021-09-23 MED ORDER — DOCUSATE SODIUM 100 MG PO CAPS
100.0000 mg | ORAL_CAPSULE | Freq: Every day | ORAL | Status: DC | PRN
  Administered 2021-09-27: 100 mg via ORAL
  Filled 2021-09-23: qty 1

## 2021-09-23 MED ORDER — MAGNESIUM OXIDE 400 MG PO TABS
400.0000 mg | ORAL_TABLET | Freq: Every day | ORAL | Status: DC
Start: 2021-09-24 — End: 2021-10-05
  Administered 2021-09-24 – 2021-10-05 (×12): 400 mg via ORAL
  Filled 2021-09-23 (×22): qty 1

## 2021-09-23 MED ORDER — GABAPENTIN 300 MG PO CAPS
300.0000 mg | ORAL_CAPSULE | Freq: Three times a day (TID) | ORAL | Status: DC
Start: 2021-09-24 — End: 2021-10-05
  Administered 2021-09-24 – 2021-10-05 (×32): 300 mg via ORAL
  Filled 2021-09-23 (×33): qty 1

## 2021-09-23 MED ORDER — CHLORHEXIDINE GLUCONATE CLOTH 2 % EX PADS
6.0000 | MEDICATED_PAD | Freq: Every day | CUTANEOUS | Status: DC
  Administered 2021-09-23 – 2021-09-25 (×2): 6 via TOPICAL

## 2021-09-23 MED ORDER — IOHEXOL 350 MG/ML SOLN: 60.0000 mL | Freq: Once | INTRAVENOUS | Status: DC | PRN

## 2021-09-23 MED ORDER — METHYLPREDNISOLONE SODIUM SUCC 125 MG IJ SOLR
60.0000 mg | Freq: Four times a day (QID) | INTRAMUSCULAR | Status: DC
Start: 2021-09-24 — End: 2021-09-24
  Administered 2021-09-24 (×2): 60 mg via INTRAVENOUS
  Filled 2021-09-23 (×2): qty 2

## 2021-09-23 MED ORDER — UMECLIDINIUM BROMIDE 62.5 MCG/ACT IN AEPB
1.0000 | INHALATION_SPRAY | Freq: Every day | RESPIRATORY_TRACT | Status: DC
Start: 2021-09-24 — End: 2021-09-24
  Administered 2021-09-24: 1 via RESPIRATORY_TRACT
  Filled 2021-09-23: qty 7

## 2021-09-23 MED ORDER — SERTRALINE HCL 50 MG PO TABS
150.0000 mg | ORAL_TABLET | Freq: Every day | ORAL | Status: DC
  Administered 2021-09-24 – 2021-10-05 (×12): 150 mg via ORAL
  Filled 2021-09-23 (×12): qty 3

## 2021-09-23 MED ORDER — METHYLPREDNISOLONE SODIUM SUCC 125 MG IJ SOLR
125.0000 mg | Freq: Once | INTRAMUSCULAR | Status: AC
  Administered 2021-09-23: 125 mg via INTRAVENOUS
  Filled 2021-09-23: qty 2

## 2021-09-23 MED ORDER — SODIUM CHLORIDE 0.9 % IV SOLN
500.0000 mg | INTRAVENOUS | Status: DC
  Filled 2021-09-23: qty 5

## 2021-09-23 MED ORDER — SODIUM CHLORIDE 0.9 % IV SOLN: INTRAVENOUS | Status: DC

## 2021-09-23 MED ORDER — SODIUM CHLORIDE 0.9 % IV SOLN
2.0000 g | Freq: Once | INTRAVENOUS | Status: AC
  Administered 2021-09-23: 2 g via INTRAVENOUS

## 2021-09-23 NOTE — ED Triage Notes (Signed)
Pt comes with c/o mid sternal CP, SOB and body aches. Pt states it hurts all over. Pt states 1 vomit episode. Pt states her spine hurts. Pt states she is so SOB that she can't breath. Pt states no relief with pain med earlier. Pt has lidocaine patch as well with no relief.

## 2021-09-23 NOTE — Progress Notes (Signed)
RT assisted with patient transport to CT and back while patient on the V60 Bipap. No complications arose during transport.

## 2021-09-23 NOTE — Consult Note (Signed)
CODE SEPSIS - PHARMACY COMMUNICATION  **Broad Spectrum Antibiotics should be administered within 1 hour of Sepsis diagnosis**  Time Code Sepsis Called/Page Received: 1458  Antibiotics Ordered: cefepime, azithromycin  Time of 1st antibiotic administration: 1524      Sharen Hones ,PharmD, BCPS Clinical Pharmacist  09/23/2021  3:01 PM

## 2021-09-23 NOTE — Consult Note (Signed)
PHARMACY -  BRIEF ANTIBIOTIC NOTE   Pharmacy has received consult(s) for cefepime from an ED provider.  The patient's profile has been reviewed for ht/wt/allergies/indication/available labs.    One time order(s) placed for  Cefepime 2 gram x 1   Further antibiotics/pharmacy consults should be ordered by admitting physician if indicated.                       Thank you, Sharen Hones, PharmD, BCPS Clinical Pharmacist   09/23/2021  3:02 PM

## 2021-09-23 NOTE — Sepsis Progress Note (Signed)
eLink is following this Code Sepsis. °

## 2021-09-23 NOTE — ED Provider Triage Note (Signed)
Emergency Medicine Provider Triage Evaluation Note  Julia [name] , a 61 y.o. female  was evaluated in triage.  Pt complains of diffuse pain since Monday. No known injury. She is also feeling short of breath. Pain in back and chest gets worse with breathing. No relief with hydrocodone or lidocaine patch on her back. She smokes. No history of DVT.  Review of Systems  Positive: Chest pain, shortness of breath Negative: Fever  Physical Exam  There were no vitals taken for this visit. Gen:   Awake, no distress   Resp:  Normal effort  MSK:   Moves extremities without difficulty  Other:    Medical Decision Making  Medically screening exam initiated at 2:02 PM.  Appropriate orders placed.  Julia [name] was informed that the remainder of the evaluation will be completed by another provider, this initial triage assessment does not replace that evaluation, and the importance of remaining in the ED until their evaluation is complete.  Noted to be hypotensive, called for next ER bed.   Julia Pester, FNP 09/23/21 1406

## 2021-09-23 NOTE — ED Notes (Signed)
Pt placed on 2L [place] with increase to 85%RA. Pt taken straight to room at this time., RN called and informed. Pt does have hx of copd.

## 2021-09-23 NOTE — Progress Notes (Signed)
Patient transported on Bipap by RT and RN from ED to ICU without complication. Will continue to monitor.

## 2021-09-23 NOTE — ED Provider Notes (Signed)
Procedures  Clinical Course as of 09/23/21 1843  Wed Sep 23, 2021  1457 Chest x-ray on my interpretation suspicious for pneumonia and effusion.  Given her profound hypoxia with hypotension although it is improved with IV fluids and will order CTA to further evaluate. [PR]  1517 Patient reassessed and does appear to be clinically improving significant white count of 23,000 and lactate 4.8 consistent with severe sepsis patient receiving IV fluids as well as IV antibiotics.  Awaiting CT angiogram.  Patient will be signed out to oncoming physician pending follow-up CT.  Will require admission the hospital. [PR]    Clinical Course User Index [PR] Willy Eddy, MD    ----------------------------------------- 6:43 PM on 09/23/2021 -----------------------------------------  Sepsis reassessment completed.  Patient initially had signs of septic shock with pneumonia, hypoxia, hypotension, lactate of 4.8.  After 2 L IV fluid bolus, blood pressure is now 93/60 (MAP of 70), and repeat lactate is normalized at 1.8.  Clinically perfusion is adequate.  We will continue maintenance infusion of lactated Ringer's, admit to hospitalist for further sepsis management.    Sharman Cheek, MD 09/23/21 [phone]

## 2021-09-23 NOTE — ED Provider Notes (Addendum)
[facility] Provider Note    Event Date/Time   First MD Initiated Contact with Patient 09/23/21 1407     (approximate)   History   Chest Pain   HPI  Julia [name] is a 61 y.o. female with an extensive past medical history including COPD, depression anxiety previous admissions to the hospital for severe acute respiratory failure with hypoxia presents to the ER for generalized malaise chest discomfort pleuritic pain.  Symptoms have been progressive worsening over the past 2 to 3 days.  Took one of her pain medications at home without improvement.  Not having any measured temperatures but has felt chills and has generalized malaise and weakness.     Physical Exam   Triage Vital Signs: ED Triage Vitals  Enc Vitals Group     BP 09/23/21 1406 (!) 74/50     Pulse Rate 09/23/21 1406 (!) 114     Resp 09/23/21 1406 (!) 22     Temp 09/23/21 1406 98.1 F (36.7 C)     Temp src --      SpO2 09/23/21 1406 (!) 80 %     Weight --      Height --      Head Circumference --      Peak Flow --      Pain Score 09/23/21 1404 10     Pain Loc --      Pain Edu? --      Excl. in [place]? --     Most recent vital signs: Vitals:   09/23/21 1406  BP: (!) 74/50  Pulse: (!) 114  Resp: (!) 22  Temp: 98.1 F (36.7 C)  SpO2: (!) 80%     Constitutional: Alert, ill appearing in mod resp distress Eyes: Conjunctivae are normal.  Head: Atraumatic. Nose: No congestion/rhinnorhea. Mouth/Throat: Mucous membranes are moist.   Neck: Painless ROM.  Cardiovascular:   Good peripheral circulation.  Cap refill 3sec, no m/g/r Respiratory: tachypnea with prolonged exp phase Gastrointestinal: Soft and nontender.  Musculoskeletal:  no deformity Neurologic:  MAE spontaneously. No gross focal neurologic deficits are appreciated.  Skin:  Skin is warm, dry and intact. No rash noted. Psychiatric: Mood and affect are normal. Speech and behavior are normal.    ED Results / Procedures /  Treatments   Labs (all labs ordered are listed, but only abnormal results are displayed) Labs Reviewed  LACTIC ACID, PLASMA - Abnormal; Notable for the following components:      Result Value   Lactic Acid, Venous 4.8 (*)    All other components within normal limits  COMPREHENSIVE METABOLIC PANEL - Abnormal; Notable for the following components:   CO2 20 (*)    Glucose, Bld 192 (*)    BUN 36 (*)    Creatinine, Ser 1.84 (*)    GFR, Estimated 31 (*)    Anion gap 19 (*)    All other components within normal limits  CBC WITH DIFFERENTIAL/PLATELET - Abnormal; Notable for the following components:   WBC 23.4 (*)    Hemoglobin 11.5 (*)    MCH 23.9 (*)    MCHC 29.6 (*)    RDW 17.5 (*)    Neutro Abs 20.2 (*)    Lymphs Abs 0.4 (*)    Abs Immature Granulocytes 2.28 (*)    All other components within normal limits  CULTURE, BLOOD (ROUTINE X 2)  CULTURE, BLOOD (ROUTINE X 2)  LACTIC ACID, PLASMA  URINALYSIS, COMPLETE (UACMP) WITH MICROSCOPIC  BLOOD  GAS, VENOUS     EKG  ED ECG REPORT I, Merlyn Lot, the attending physician, personally viewed and interpreted this ECG.   Date: 09/23/2021  EKG Time: 14:08  Rate: 110  Rhythm: sinus  Axis: right  Intervals: normal qt  ST&T Change: no stemi, no depression    RADIOLOGY Please see ED Course for my review and interpretation.  I personally reviewed all radiographic images ordered to evaluate for the above acute complaints and reviewed radiology reports and findings.  These findings were personally discussed with the patient.  Please see medical record for radiology report.    PROCEDURES:  Critical Care performed: Yes, see critical care procedure note(s)  .Critical Care Performed by: Merlyn Lot, MD Authorized by: Merlyn Lot, MD   Critical care provider statement:    Critical care time (minutes):  35   Critical care was necessary to treat or prevent imminent or life-threatening deterioration of the following  conditions:  Respiratory failure   Critical care was time spent personally by me on the following activities:  Ordering and performing treatments and interventions, ordering and review of laboratory studies, ordering and review of radiographic studies, pulse oximetry, re-evaluation of patient's condition, review of old charts, obtaining history from patient or surrogate, examination of patient, evaluation of patient's response to treatment, discussions with primary provider, discussions with consultants and development of treatment plan with patient or surrogate   MEDICATIONS ORDERED IN ED: Medications  morphine (PF) 4 MG/ML injection 4 mg (has no administration in time range)  ondansetron (ZOFRAN) injection 4 mg (has no administration in time range)  lactated ringers infusion (has no administration in time range)  ceFEPIme (MAXIPIME) 2 g in sodium chloride 0.9 % 100 mL IVPB (has no administration in time range)  azithromycin (ZITHROMAX) 500 mg in sodium chloride 0.9 % 250 mL IVPB (has no administration in time range)  sodium chloride 0.9 % bolus 1,000 mL (has no administration in time range)  methylPREDNISolone sodium succinate (SOLU-MEDROL) 125 mg/2 mL injection 125 mg (has no administration in time range)  iohexol (OMNIPAQUE) 350 MG/ML injection 60 mL (has no administration in time range)  ipratropium-albuterol (DUONEB) 0.5-2.5 (3) MG/3ML nebulizer solution 3 mL (3 mLs Nebulization Given 09/23/21 1434)  ipratropium-albuterol (DUONEB) 0.5-2.5 (3) MG/3ML nebulizer solution 3 mL (3 mLs Nebulization Given 09/23/21 1431)  sodium chloride 0.9 % bolus 1,000 mL (1,000 mLs Intravenous New Bag/Given 09/23/21 1441)     IMPRESSION / MDM / [place] / ED COURSE  I reviewed the triage vital signs and the nursing notes.                              Differential diagnosis includes, but is not limited to, Asthma, copd, CHF, pna, ptx, malignancy, Pe, anemia  Patient presenting to the ER for  evaluation of symptoms as described above.  Patient critically ill with evidence of acute respiratory failure with significant hypoxia requiring BiPAP as well as hypotension.  This presenting complaint could reflect a potentially life-threatening illness therefore the patient will be placed on continuous pulse oximetry and telemetry for monitoring.  Laboratory evaluation will be sent to evaluate for the above complaints.     Clinical Course as of 09/23/21 1520  Wed Sep 23, 2021  1457 Chest x-ray on my interpretation suspicious for pneumonia and effusion.  Given her profound hypoxia with hypotension although it is improved with IV fluids and will order CTA to further evaluate. [  PR]  1517 Patient reassessed and does appear to be clinically improving significant white count of 23,000 and lactate 4.8 consistent with severe sepsis patient receiving IV fluids as well as IV antibiotics.  Awaiting CT angiogram.  Patient will be signed out to oncoming physician pending follow-up CT.  Will require admission the hospital. [PR]    Clinical Course User Index [PR] Merlyn Lot, MD    Patient's presentation is most consistent with acute presentation with potential threat to life or bodily function.   FINAL CLINICAL IMPRESSION(S) / ED DIAGNOSES   Final diagnoses:  Acute respiratory failure with hypoxia (HCC)  Sepsis with acute hypoxic respiratory failure, due to unspecified organism, unspecified whether septic shock present [facility])     Rx / DC Orders   ED Discharge Orders     None        Note:  This document was prepared using Dragon voice recognition software and may include unintentional dictation errors.     Merlyn Lot, MD 09/23/21 1520

## 2021-09-23 NOTE — H&P (Addendum)
History and Physical   [facility] [place] [facility] Ad[facility] History and Physical [employer] Donya Hitch, D.O.    Patient Name: Julia [name] MR#: [id number] Date of Birth: [birth year] Date of Admission: 09/23/2021  Referring MD/NP/PA: Dr. Scotty Courtstafford Primary Care Physician: Olena LeatherwoodFarrug, Eugene D, FNP  Chief Complaint:  Chief Complaint  Patient presents with   Chest Pain    HPI: Julia HawkManchell Tavenner is a 61 y.o. female with a known history of COPD, depression, anxiety presents to the emergency department for evaluation of chest pain, SOB.  Patient was in a usual state of health until 2-3 days ago when she has developed worsening chest discomfort, worse with inspiration, refractory to home medications including inhalers and pain meds.   Patient denies fevers/chills, weakness, dizziness, N/V/C/D, abdominal pain, dysuria/frequency, changes in mental status.    Otherwise there has been no change in status. Patient has been taking medication as prescribed and there has been no recent change in medication or diet.  No recent antibiotics.  There has been no recent illness, hospitalizations, travel or sick contacts.    EMS/ED Course: Patient received Cefepime, Azithro. Medical admission has been requested for further management of acute hypoxic resp failure and sepsis 2/2 multifocal pneumonia, likely aspiration.  Review of Systems:  CONSTITUTIONAL: Positive fever/chills, fatigue, weakness, negative weight gain/loss, headache. EYES: No blurry or double vision. ENT: No tinnitus, postnasal drip, redness or soreness of the oropharynx. RESPIRATORY: No cough, dyspnea, wheeze.  No hemoptysis.  CARDIOVASCULAR: Positive chest pain, negative palpitations, syncope, orthopnea. No lower extremity edema.  GASTROINTESTINAL: No nausea, vomiting, abdominal pain, diarrhea, constipation.  No hematemesis, melena or hematochezia. GENITOURINARY: No dysuria, frequency, hematuria. ENDOCRINE: No polyuria or nocturia. No  heat or cold intolerance. HEMATOLOGY: No anemia, bruising, bleeding. INTEGUMENTARY: No rashes, ulcers, lesions. MUSCULOSKELETAL: No arthritis, gout, dyspnea. NEUROLOGIC: No numbness, tingling, ataxia, seizure-type activity, weakness. PSYCHIATRIC: No anxiety, depression, insomnia.   Past Medical History:  Diagnosis Date   Anemia    Anxiety    Asthma    COPD (chronic obstructive pulmonary disease) (HCC)    Depression    Ventral hernia     Past Surgical History:  Procedure Laterality Date   COLON SURGERY     EAR TUBE REMOVAL     FRACTURE SURGERY     HERNIA REPAIR     TUBAL LIGATION       reports that she quit smoking about 16 months ago. Her smoking use included cigarettes. She smoked an average of .5 packs per day. She has never used smokeless tobacco. She reports that she does not drink alcohol and does not use drugs.  Allergies  Allergen Reactions   Banana Nausea And Vomiting    Family History  Problem Relation Age of Onset   Sarcoidosis Mother    Alcohol abuse Father     Prior to Admission medications   Medication Sig Start Date End Date Taking? Authorizing Provider  acetaminophen (TYLENOL) 500 MG tablet Take 1,000 mg by mouth every 6 (six) hours as needed.    [provider]  albuterol (PROVENTIL HFA;VENTOLIN HFA) 108 (90 BASE) MCG/ACT inhaler Inhale 2 puffs into the lungs every 6 (six) hours as needed for wheezing or shortness of breath. 04/09/15   Adrian SaranMody, Sital, MD  albuterol (PROVENTIL) (2.5 MG/3ML) 0.083% nebulizer solution Take 2.5 mg by nebulization every 6 (six) hours as needed for wheezing or shortness of breath.    [provider]  ALPRAZolam Prudy Feeler(XANAX) 0.5 MG tablet Take 0.5-1 mg by mouth 2 (  two) times daily as needed for anxiety. 12/17/20 12/17/21  [provider]  dextromethorphan-guaiFENesin (MUCINEX DM) 30-600 MG 12hr tablet Take 1 tablet by mouth 2 (two) times daily. 03/20/21   Rolly Salter, MD  docusate sodium (COLACE) 100 MG  capsule Take 100 mg by mouth daily as needed for mild constipation.    [provider]  gabapentin (NEURONTIN) 300 MG capsule Take 300 mg by mouth 3 (three) times daily. 12/17/20   [provider]  guaiFENesin (ROBITUSSIN) 100 MG/5ML liquid Take 5 mLs by mouth every 4 (four) hours as needed for cough or to loosen phlegm. 07/21/21   Delfino Lovett, MD  magnesium oxide (MAG-OX) 400 MG tablet Take 400 mg by mouth daily.    [provider]  pantoprazole (PROTONIX) 40 MG tablet Take 40 mg by mouth daily.    [provider]  predniSONE (STERAPRED UNI-PAK 21 TAB) 10 MG (21) TBPK tablet Start 60 mg po daily, taper 10 mg daily until finish 07/21/21   Delfino Lovett, MD  sertraline (ZOLOFT) 100 MG tablet Take 150 mg by mouth daily.    [provider]  TRELEGY ELLIPTA 100-62.5-25 MCG/ACT AEPB Inhale 1 puff into the lungs daily. 07/10/21   [provider]    Physical Exam: Vitals:   09/23/21 1640 09/23/21 1730 09/23/21 1815 09/23/21 1830  BP: (!) 90/50 (!) 91/55 (!) 96/59 93/60  Pulse: 89 86 86 84  Resp: (!) 27 (!) 21 (!) 23 (!) 22  Temp:      SpO2: 99% 95% 97% 96%    GENERAL: 61 y.o.-year-old white female patient, somnolent, on BiPAP, lying in the bed in no acute distress.  Pleasant and cooperative.   HEENT: Head atraumatic, normocephalic. Pupils equal. Mucus membranes moist. NECK: Supple. No JVD. CHEST: Diffuse expiratory wheezing. No use of accessory muscles of respiration.   CARDIOVASCULAR: S1, S2 normal. No murmurs, rubs, or gallops. Cap refill <2 seconds. Pulses intact distally.  ABDOMEN: Soft, nondistended, nontender. No rebound, guarding, rigidity. Normoactive bowel sounds present in all four quadrants.  EXTREMITIES: No pedal edema, cyanosis, or clubbing. No calf tenderness or Homan's sign.  NEUROLOGIC: The patient is alert and oriented x 3. Cranial nerves II through XII are grossly intact with no focal sensorimotor deficit. PSYCHIATRIC:  Normal  affect, mood, thought content. SKIN: Warm, dry, and intact without obvious rash, lesion, or ulcer.    Labs on Admission:  CBC: Recent Labs  Lab 09/23/21 1424  WBC 23.4*  NEUTROABS 20.2*  HGB 11.5*  HCT 38.9  MCV 80.9  PLT 191   Basic Metabolic Panel: Recent Labs  Lab 09/23/21 1424  NA 138  K 4.3  CL 99  CO2 20*  GLUCOSE 192*  BUN 36*  CREATININE 1.84*  CALCIUM 9.7   GFR: CrCl cannot be calculated (Unknown ideal weight.). Liver Function Tests: Recent Labs  Lab 09/23/21 1424  AST 33  ALT 14  ALKPHOS 85  BILITOT 0.5  PROT 8.1  ALBUMIN 3.8   No results for input(s): LIPASE, AMYLASE in the last 168 hours. No results for input(s): AMMONIA in the last 168 hours. Coagulation Profile: No results for input(s): INR, PROTIME in the last 168 hours. Cardiac Enzymes: No results for input(s): CKTOTAL, CKMB, CKMBINDEX, TROPONINI in the last 168 hours. BNP (last 3 results) No results for input(s): PROBNP in the last 8760 hours. HbA1C: No results for input(s): HGBA1C in the last 72 hours. CBG: No results for input(s): GLUCAP in the last 168 hours. Lipid  Profile: No results for input(s): CHOL, HDL, LDLCALC, TRIG, CHOLHDL, LDLDIRECT in the last 72 hours. Thyroid Function Tests: No results for input(s): TSH, T4TOTAL, FREET4, T3FREE, THYROIDAB in the last 72 hours. Anemia Panel: No results for input(s): VITAMINB12, FOLATE, FERRITIN, TIBC, IRON, RETICCTPCT in the last 72 hours. Urine analysis:    Component Value Date/Time   COLORURINE STRAW (A) 03/01/2016 1935   APPEARANCEUR CLEAR (A) 03/01/2016 1935   LABSPEC 1.016 03/01/2016 1935   PHURINE 6.0 03/01/2016 1935   GLUCOSEU >500 (A) 03/01/2016 1935   HGBUR NEGATIVE 03/01/2016 1935   BILIRUBINUR NEGATIVE 03/01/2016 1935   KETONESUR NEGATIVE 03/01/2016 1935   PROTEINUR NEGATIVE 03/01/2016 1935   NITRITE NEGATIVE 03/01/2016 1935   LEUKOCYTESUR NEGATIVE 03/01/2016 1935   Sepsis  Labs: @LABRCNTIP (procalcitonin:4,lacticidven:4) )No results found for this or any previous visit (from the past 240 hour(s)).   Radiological Exams on Admission: CT Angio Chest PE W and/or Wo Contrast  Result Date: 09/23/2021 CLINICAL DATA:  Pulmonary embolus suspected EXAM: CT ANGIOGRAPHY CHEST WITH CONTRAST TECHNIQUE: Multidetector CT imaging of the chest was performed using the standard protocol during bolus administration of intravenous contrast. Multiplanar CT image reconstructions and MIPs were obtained to evaluate the vascular anatomy. RADIATION DOSE REDUCTION: This exam was performed according to the departmental dose-optimization program which includes automated exposure control, adjustment of the mA and/or kV according to patient size and/or use of iterative reconstruction technique. CONTRAST:  69mL OMNIPAQUE IOHEXOL 350 MG/ML SOLN COMPARISON:  Chest CT dated July 19, 2021 FINDINGS: Cardiovascular: Adequate contrast opacification of the pulmonary arteries. No evidence of pulmonary embolus. Heart is upper limits of normal in size. No pericardial effusion. Left main and three-vessel coronary artery calcifications. Atherosclerotic disease of the thoracic aorta. Mediastinum/Nodes: Large hiatal hernia. Mediastinal and bilateral hilar adenopathy. Mediastinal adenopathy is similar when compared with prior exam. Reference right lower paratracheal lymph node measuring 1.7 cm in short axis on series 6, image 176. New bilateral hilar lymphadenopathy. Lungs/Pleura: Centrilobular emphysema. Complete occlusion of the right lower lobe bronchus with near complete collapse of the right lower lobe. New patchy bilateral consolidations. Bilateral small solid pulmonary nodules are similar to recent prior exam. Upper Abdomen: No acute abnormality. Musculoskeletal: Unchanged T1 compression fracture and sternal fracture. No acute osseous abnormality. Review of the MIP images confirms the above findings. IMPRESSION: 1. No  evidence of pulmonary embolus. 2. New bilateral patchy consolidations, concerning for multifocal pneumonia. 3. Near complete collapse of the right lower lobe and occlusion of the right lower lobe bronchus, likely due to aspiration. 4. Increased size of hilar lymph nodes, likely reactive. 5. Bilateral small solid pulmonary nodules are similar to recent prior exam and likely due to background chronic atypical infection. 6. Aortic Atherosclerosis (ICD10-I70.0) and Emphysema (ICD10-J43.9). Electronically Signed   By: July 21, 2021 M.D.   On: 09/23/2021 18:30   DG Chest Port 1 View  Result Date: 09/23/2021 CLINICAL DATA:  Questionable sepsis - evaluate for abnormality EXAM: PORTABLE CHEST 1 VIEW COMPARISON:  July 19, 2021. FINDINGS: New consolidation in the left midlung. Additional reticulonodular opacities are similar. New probable right lower lobe collapse. No definite pleural effusions or pneumothorax. Cardiomediastinal silhouette is similar. Polyarticular degenerative change. IMPRESSION: 1. New consolidation in the left midlung , concerning for pneumonia. Followup PA and lateral chest X-ray is recommended in 3-4 weeks following trial of antibiotic therapy to ensure resolution and exclude underlying malignancy. 2. New probable right lower lobe collapse. 3. 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 Electronically Signed   By: Feliberto Harts M.D.   On: 09/23/2021 14:41    EKG: Sinus tach at 110 bpm with rightward axis and nonspecific ST-T wave changes.   Assessment/Plan  This is a 61 y.o. female with a history of COPD, depression, anxiety now being admitted with:  #. #. Sepsis secondary to multifocal pneumonia ? aspiration - Admit to inpatient stepdown with telemetry monitoring - IV antibiotics: Maxipime, Azithro - IV fluid hydration, O2, mednebs and expectorants as needed.  - Follow up blood,  sputum cultures. Urine legionella and strep Ag.  - Repeat CBC in am.  - Continue BiPAP - Pulm consulted - d/w Dr. Belia Heman  #. AKI, likely 2/2 above - Continue IVFs and monitor GFR  #. History of COPD - Added Solumedrol given wheezing  Admission status: Inpatient stepdown IV Fluids: NS Diet/Nutrition: NPO Consults called: Pulm  DVT Px: Lovenox, SCDs and early ambulation. Code Status: Full Code  Disposition Plan: To home in 1-2 days  All the records are reviewed and case discussed with ED provider. Management plans discussed with the patient and/or family who express understanding and agree with plan of care.  Tonye Royalty D.O. on 09/23/2021 at 6:54 PM CC: Primary care physician; Olena Leatherwood, FNP   09/23/2021, 6:54 PM

## 2021-09-24 ENCOUNTER — Encounter: Payer: Self-pay | Admitting: Family Medicine

## 2021-09-24 DIAGNOSIS — J189 Pneumonia, unspecified organism: Secondary | ICD-10-CM | POA: Diagnosis not present

## 2021-09-24 DIAGNOSIS — A419 Sepsis, unspecified organism: Secondary | ICD-10-CM | POA: Diagnosis not present

## 2021-09-24 LAB — RESPIRATORY PANEL BY PCR

## 2021-09-24 LAB — EXPECTORATED SPUTUM ASSESSMENT W GRAM STAIN, RFLX TO RESP C

## 2021-09-24 LAB — BASIC METABOLIC PANEL
Anion gap: 9 (ref 5–15)
BUN: 32 mg/dL — ABNORMAL HIGH (ref 8–23)
CO2: 25 mmol/L (ref 22–32)
Calcium: 8.5 mg/dL — ABNORMAL LOW (ref 8.9–10.3)
Chloride: 108 mmol/L (ref 98–111)
Creatinine, Ser: 1.05 mg/dL — ABNORMAL HIGH (ref 0.44–1.00)
GFR, Estimated: >60 mL/min (ref >60)
Glucose, Bld: 127 mg/dL — ABNORMAL HIGH (ref 70–99)
Potassium: 4.1 mmol/L (ref 3.5–5.1)
Sodium: 142 mmol/L (ref 135–145)

## 2021-09-24 LAB — CBC
HCT: 33.8 % — ABNORMAL LOW (ref 36.0–46.0)
Hemoglobin: 9.9 g/dL — ABNORMAL LOW (ref 12.0–15.0)
MCH: 24 pg — ABNORMAL LOW (ref 26.0–34.0)
MCHC: 29.3 g/dL — ABNORMAL LOW (ref 30.0–36.0)
MCV: 82 fL (ref 80.0–100.0)
Platelets: 149 10*3/uL — ABNORMAL LOW (ref 150–400)
RBC: 4.12 MIL/uL (ref 3.87–5.11)
RDW: 17.3 % — ABNORMAL HIGH (ref 11.5–15.5)
WBC: 18 10*3/uL — ABNORMAL HIGH (ref 4.0–10.5)
nRBC: 0 % (ref 0.0–0.2)

## 2021-09-24 LAB — HIV ANTIBODY (ROUTINE TESTING W REFLEX): HIV Screen 4th Generation wRfx: NONREACTIVE

## 2021-09-24 LAB — BLOOD GAS, ARTERIAL
Acid-base deficit: 1.2 mmol/L (ref 0.0–2.0)
Bicarbonate: 25.7 mmol/L (ref 20.0–28.0)
Delivery systems: POSITIVE
Expiratory PAP: 5 cmH2O
FIO2: 40 %
Inspiratory PAP: 10 cmH2O
O2 Saturation: 93.1 %
Patient temperature: 37
RATE: 10 resp/min
pCO2 arterial: 51 mmHg — ABNORMAL HIGH (ref 32–48)
pH, Arterial: 7.31 — ABNORMAL LOW (ref 7.35–7.45)
pO2, Arterial: 67 mmHg — ABNORMAL LOW (ref 83–108)

## 2021-09-24 LAB — MAGNESIUM: Magnesium: 1.8 mg/dL (ref 1.7–2.4)

## 2021-09-24 LAB — TROPONIN I (HIGH SENSITIVITY): Troponin I (High Sensitivity): 10 ng/L (ref <18)

## 2021-09-24 LAB — LACTIC ACID, PLASMA: Lactic Acid, Venous: 2 mmol/L (ref 0.5–1.9)

## 2021-09-24 LAB — SARS CORONAVIRUS 2 BY RT PCR: SARS Coronavirus 2 by RT PCR: NEGATIVE

## 2021-09-24 LAB — PHOSPHORUS: Phosphorus: 4.8 mg/dL — ABNORMAL HIGH (ref 2.5–4.6)

## 2021-09-24 LAB — GLUCOSE, CAPILLARY: Glucose-Capillary: 134 mg/dL — ABNORMAL HIGH (ref 70–99)

## 2021-09-24 LAB — PROCALCITONIN: Procalcitonin: 10.68 ng/mL

## 2021-09-24 LAB — STREP PNEUMONIAE URINARY ANTIGEN: Strep Pneumo Urinary Antigen: POSITIVE — AB

## 2021-09-24 MED ORDER — VANCOMYCIN HCL IN DEXTROSE 1-5 GM/200ML-% IV SOLN: 1000.0000 mg | INTRAVENOUS | Status: DC

## 2021-09-24 MED ORDER — BUDESONIDE 0.5 MG/2ML IN SUSP
0.5000 mg | Freq: Two times a day (BID) | RESPIRATORY_TRACT | Status: DC
  Administered 2021-09-24 – 2021-10-05 (×23): 0.5 mg via RESPIRATORY_TRACT
  Filled 2021-09-24 (×23): qty 2

## 2021-09-24 MED ORDER — KETOROLAC TROMETHAMINE 15 MG/ML IJ SOLN
15.0000 mg | Freq: Four times a day (QID) | INTRAMUSCULAR | Status: AC | PRN
Start: 2021-09-24 — End: 2021-09-29
  Administered 2021-09-24 – 2021-09-29 (×11): 15 mg via INTRAVENOUS
  Filled 2021-09-24 (×12): qty 1

## 2021-09-24 MED ORDER — SODIUM CHLORIDE 0.9 % IV SOLN: 1.0000 g | INTRAVENOUS | Status: DC

## 2021-09-24 MED ORDER — SODIUM CHLORIDE 0.9 % IV SOLN
2.0000 g | Freq: Two times a day (BID) | INTRAVENOUS | Status: DC
  Filled 2021-09-24: qty 12.5

## 2021-09-24 MED ORDER — SODIUM CHLORIDE 0.9 % IV SOLN
2.0000 g | Freq: Every day | INTRAVENOUS | Status: AC
  Administered 2021-09-24 – 2021-09-28 (×5): 2 g via INTRAVENOUS
  Filled 2021-09-24: qty 20
  Filled 2021-09-24 (×3): qty 2
  Filled 2021-09-24: qty 20

## 2021-09-24 MED ORDER — ACETAMINOPHEN 500 MG PO TABS
1000.0000 mg | ORAL_TABLET | Freq: Three times a day (TID) | ORAL | Status: DC | PRN
  Administered 2021-09-24 – 2021-09-30 (×3): 1000 mg via ORAL
  Filled 2021-09-24 (×3): qty 2

## 2021-09-24 MED ORDER — VANCOMYCIN HCL 1250 MG/250ML IV SOLN
1250.0000 mg | Freq: Once | INTRAVENOUS | Status: DC
  Filled 2021-09-24: qty 250

## 2021-09-24 MED ORDER — CYCLOBENZAPRINE HCL 10 MG PO TABS
5.0000 mg | ORAL_TABLET | Freq: Three times a day (TID) | ORAL | Status: DC | PRN
  Administered 2021-09-24 – 2021-10-04 (×9): 5 mg via ORAL
  Filled 2021-09-24 (×9): qty 1

## 2021-09-24 MED ORDER — IPRATROPIUM-ALBUTEROL 0.5-2.5 (3) MG/3ML IN SOLN
3.0000 mL | Freq: Four times a day (QID) | RESPIRATORY_TRACT | Status: DC
  Administered 2021-09-24 – 2021-10-05 (×40): 3 mL via RESPIRATORY_TRACT
  Filled 2021-09-24 (×42): qty 3

## 2021-09-24 MED ORDER — METHYLPREDNISOLONE SODIUM SUCC 40 MG IJ SOLR
20.0000 mg | Freq: Two times a day (BID) | INTRAMUSCULAR | Status: DC
  Administered 2021-09-24 – 2021-09-25 (×2): 20 mg via INTRAVENOUS
  Filled 2021-09-24 (×2): qty 1

## 2021-09-24 MED ORDER — SODIUM CHLORIDE 0.9 % IV SOLN
2.0000 g | INTRAVENOUS | Status: DC
  Filled 2021-09-24: qty 12.5

## 2021-09-24 NOTE — Progress Notes (Signed)
NAME:  Julia [name], MRN:  [id number], DOB:  Dec 12, 1960, LOS: 1 ADMISSION DATE:  09/23/2021, CONSULTATION DATE:  09/23/21 REFERRING MD:  Hospitalist, CHIEF COMPLAINT:  COPD exacerbation   History of Present Illness:   61 y.o. female with a known history of COPD, depression, anxiety presents to the emergency department for evaluation of chest pain, SOB.  Patient was in a usual state of health until 2-3 days ago when she has developed worsening chest discomfort, worse with inspiration, refractory to home medications including inhalers and pain meds. CTA chest was done in the ED which showed no PE but revealed new bilateral patchy consolidations, concerning for multifocal pneumonia. In addition, lab worked revealed and a lactate of 4.8 and AKI (Cr 1.84, no prior CKD) concerning for sepsis. Patient received azithromycin, Cefepime and was placed on BIPAP. The patient was admitted as stepdown by the hospitalist team and critical care was consulted for evaluation.   Patient seen and examined on arrival to the ICU. She is alert and oriented x3, in no distress on BIPAP with stable vital signs. Patient states she feels a lot better on the BIPAP although it is uncomfortable. She does complain of some generalized aches pains, but states she has chronic pain at baseline. She denies any chest pain, nausea or vomiting.   Chart reviewed. Significant lab values include: CO2 20, Glucose 192, Lactate 4.8-->1.1, Cr 1.84, Anion Gap 19, WBC 23.4  Pertinent  Medical History  COPD Depression  Anxiety  Micro Data:  5/24: Respiratory Viral Panel>>negative 5/24: Strep Pneumo urinary antigen>> POSITIVE 5/24: Legionella urinary antigen>> 5/24: Blood x2>> NGTD 5/24: MRSA PCR>> positive 5/24: Sputum>>  Antimicrobials:  Azithromycin 5/24 x1 dose Cefepime 5/24 x1 dose Ceftriaxone 5/25>>  Significant Hospital Events: Including procedures, antibiotic start and stop dates in addition to other pertinent events    5/24: Admitted by hospitalist team sepsis 2/2 pneumonia requiring BiPAP.  PCCM consulted. 5/25: Remains on BiPAP per pt preference.  Strep Pneumo urinary antigen +, ABX changed to Ceftriaxone.  Add BD + Pulmicort nebs  Interim History / Subjective:  -Admitted overnight on BiPAP -Remains on BiPAP per pt preference this am, reports wheezing and shortness of breath ~ Breo incruse/ellipta changed to Duonebs + Pulmicort nebs -Strep Pneumo legionella urinary antigen + ~ ABX changed to Rocephin -Leukocytosis improved to 18K (23.4 K) -Creatinine improved to 1.05 (1.84) with IVF  Objective   Blood pressure (!) 100/56, pulse 77, temperature (!) 96.9 F (36.1 C), temperature source Axillary, resp. rate 20, height 5\' 5"  (1.651 m), weight 57.4 kg, SpO2 94 %.    FiO2 (%):  [40 %] 40 %   Intake/Output Summary (Last 24 hours) at 09/24/2021 0841 Last data filed at 09/24/2021 0500 Gross per 24 hour  Intake 180 ml  Output 1360 ml  Net -1180 ml   Filed Weights   09/23/21 2025  Weight: 57.4 kg    Examination: General: Acute on chronically ill-appearing female, sitting in bed, alert and oriented, in no distress, on BIPAP HENT: Pupils equal and reactive, sclera clear, normocephalic Lungs: Lungs coarse with mild expiratory wheeze diminshed throughout Cardiovascular: Heart sounds S1S2, no edema, warm and palpable pulses Abdomen: Soft, non-tender, bowel sounds active Extremities: No deformities Neuro: no focal deficits   Resolved Hospital Problem list   Elevated Lactate cleared  Assessment & Plan:   Acute hypoxemic respiratory failure, 2/2 Multifocal pneumonia & COPD Exacerbation -Supplemental O2 as needed to maintain O2 sats 88 to 92% -BiPAP, wean as tolerated -Follow  intermittent Chest X-ray & ABG as needed -Bronchodilators & Pulmicort nebs -IV Steroids -ABX as above -Pulmonary toilet as able  Sepsis due to Multifocal Pneumonia (Strep Pneumoniae Pneumonia) -Monitor fever curve -Trend  WBC's & Procalcitonin -Follow cultures as above -Strep Pneumo urinary antigen + -Change to Ceftriaxone   Acute Kidney Injury, 2/2 to shock state from above -Monitor I&O's / urinary output -Follow BMP -Ensure adequate renal perfusion -Avoid nephrotoxic agents as able -Replace electrolytes as indicated -IV fluids  Anxiety Depression -Home medications: Zoloft 150mg  daily continued    Best Practice (right click and "Reselect all SmartList Selections" daily)   Diet/type: NPO, regular diet when weanedd off BiPAP DVT prophylaxis: LMWH GI prophylaxis: PPI Lines: N/A Foley:  N/A Code Status:  full code Last date of multidisciplinary goals of care discussion [5/25]  Patient and her spouse updated at bedside 5/25.  All questions answered to their satisfaction.  Labs   CBC: Recent Labs  Lab 09/23/21 1424 09/24/21 0451  WBC 23.4* 18.0*  NEUTROABS 20.2*  --   HGB 11.5* 9.9*  HCT 38.9 33.8*  MCV 80.9 82.0  PLT 191 149*     Basic Metabolic Panel: Recent Labs  Lab 09/23/21 1424 09/24/21 0451  NA 138 142  K 4.3 4.1  CL 99 108  CO2 20* 25  GLUCOSE 192* 127*  BUN 36* 32*  CREATININE 1.84* 1.05*  CALCIUM 9.7 8.5*  MG  --  1.8  PHOS  --  4.8*    GFR: Estimated Creatinine Clearance: 50.6 mL/min (A) (by C-G formula based on SCr of 1.05 mg/dL (H)). Recent Labs  Lab 09/23/21 1424 09/23/21 1724 09/23/21 2117 09/23/21 2328 09/24/21 0451  WBC 23.4*  --   --   --  18.0*  LATICACIDVEN 4.8* 1.8 2.0* 1.1  --      Liver Function Tests: Recent Labs  Lab 09/23/21 1424  AST 33  ALT 14  ALKPHOS 85  BILITOT 0.5  PROT 8.1  ALBUMIN 3.8    No results for input(s): LIPASE, AMYLASE in the last 168 hours. No results for input(s): AMMONIA in the last 168 hours.  ABG    Component Value Date/Time   PHART 7.31 (L) 09/24/2021 0119   PCO2ART 51 (H) 09/24/2021 0119   PO2ART 67 (L) 09/24/2021 0119   HCO3 25.7 09/24/2021 0119   ACIDBASEDEF 1.2 09/24/2021 0119   O2SAT  93.1 09/24/2021 0119      Coagulation Profile: No results for input(s): INR, PROTIME in the last 168 hours.  Cardiac Enzymes: No results for input(s): CKTOTAL, CKMB, CKMBINDEX, TROPONINI in the last 168 hours.  HbA1C: Hemoglobin A1C  Date/Time Value Ref Range Status  03/13/2014 04:26 AM 6.7 (H) 4.2 - 6.3 % Final    Comment:    The American Diabetes Association recommends that a primary goal of therapy should be <7% and that physicians should reevaluate the treatment regimen in patients with HbA1c values consistently >8%.     CBG: Recent Labs  Lab 09/23/21 2014  GLUCAP 134*    Review of Systems:   Negative except stated in HPI  Past Medical History:  She,  has a past medical history of Anemia, Anxiety, Asthma, COPD (chronic obstructive pulmonary disease) (HCC), Depression, and Ventral hernia.   Surgical History:   Past Surgical History:  Procedure Laterality Date   COLON SURGERY     EAR TUBE REMOVAL     FRACTURE SURGERY     HERNIA REPAIR     TUBAL LIGATION  Social History:   reports that she quit smoking about 17 months ago. Her smoking use included cigarettes. She smoked an average of .5 packs per day. She has never used smokeless tobacco. She reports that she does not drink alcohol and does not use drugs.   Family History:  Her family history includes Alcohol abuse in her father; Sarcoidosis in her mother.   Allergies Allergies  Allergen Reactions   Banana Nausea And Vomiting     Home Medications  Prior to Admission medications   Medication Sig Start Date End Date Taking? Authorizing Provider  acetaminophen (TYLENOL) 500 MG tablet Take 1,000 mg by mouth every 6 (six) hours as needed.    [provider]  albuterol (PROVENTIL HFA;VENTOLIN HFA) 108 (90 BASE) MCG/ACT inhaler Inhale 2 puffs into the lungs every 6 (six) hours as needed for wheezing or shortness of breath. 04/09/15   Adrian SaranMody, Sital, MD  albuterol (PROVENTIL) (2.5 MG/3ML) 0.083%  nebulizer solution Take 2.5 mg by nebulization every 6 (six) hours as needed for wheezing or shortness of breath.    [provider]  ALPRAZolam Prudy Feeler(XANAX) 0.5 MG tablet Take 0.5-1 mg by mouth 2 (two) times daily as needed for anxiety. 12/17/20 12/17/21  [provider]  dextromethorphan-guaiFENesin (MUCINEX DM) 30-600 MG 12hr tablet Take 1 tablet by mouth 2 (two) times daily. 03/20/21   Rolly SalterPatel, Pranav M, MD  docusate sodium (COLACE) 100 MG capsule Take 100 mg by mouth daily as needed for mild constipation.    [provider]  gabapentin (NEURONTIN) 300 MG capsule Take 300 mg by mouth 3 (three) times daily. 12/17/20   [provider]  guaiFENesin (ROBITUSSIN) 100 MG/5ML liquid Take 5 mLs by mouth every 4 (four) hours as needed for cough or to loosen phlegm. 07/21/21   Delfino LovettShah, Vipul, MD  magnesium oxide (MAG-OX) 400 MG tablet Take 400 mg by mouth daily.    [provider]  pantoprazole (PROTONIX) 40 MG tablet Take 40 mg by mouth daily.    [provider]  predniSONE (STERAPRED UNI-PAK 21 TAB) 10 MG (21) TBPK tablet Start 60 mg po daily, taper 10 mg daily until finish 07/21/21   Delfino LovettShah, Vipul, MD  sertraline (ZOLOFT) 100 MG tablet Take 150 mg by mouth daily.    [provider]  TRELEGY ELLIPTA 100-62.5-25 MCG/ACT AEPB Inhale 1 puff into the lungs daily. 07/10/21   [provider]     Critical care time: 40 minutes    Harlon DittyJeremiah Mark Benecke, AGACNP-BC [facility] Prefer epic messenger for cross cover needs If after hours, please call E-link

## 2021-09-24 NOTE — Progress Notes (Signed)
PROGRESS NOTE    KHLOE HUNKELE  FBP:[id number] DOB: 04-26-1961 DOA: 09/23/2021 PCP: Olena Leatherwood, FNP  160A/160A-AA   Assessment & Plan:   Principal Problem:   Sepsis due to pneumonia (HCC)   Genisis Leisner is a 61 y.o. female with a known history of COPD, depression, anxiety presents to the emergency department for evaluation of chest pain, SOB.  Patient was in a usual state of health until 2-3 days ago when she has developed worsening chest discomfort, worse with inspiration, refractory to home medications including inhalers and pain meds.    Severe Sepsis secondary to multifocal pneumonia --tachycardia, tachypnea, leukocytosis, AKI --tx with abx  Multifocal PNA 2/2 strep pneumo --strep pneumo antigen pos --cont ceftriaxone  COPD exacerbation --started on IV solumedrol and DuoNeb --cont IV solumedrol with taper --cont DuoNeb  Acute on chronic hypoxemic respiratory failure --pt uses 2.5L O2 nightly at baseline --On presentation, needed BiPAP, weaned down to 6L O2 this morning --Continue supplemental O2 to keep sats between 88-92%, wean as tolerated  #. AKI --Cr 1.84 in presentation.  Improved to 1.05 with IVF --cont NS@50  for now   DVT prophylaxis: Lovenox SQ Code Status: Full code  Family Communication:  Level of care: Med-Surg Dispo:   The patient is from: home Anticipated d/c is to: home Anticipated d/c date is: 2-3 days Patient currently is not medically ready to d/c due to: IV abx for PNA, on 6L O2   Subjective and Interval History:  Pt was weaned off BiPAP and to 6L O2 this morning.  Reported coughing with sputum production that's blood tinged.     Objective: Vitals:   09/24/21 1223 09/24/21 1300 09/24/21 1400 09/24/21 1441  BP:  111/74 (!) 100/49 (!) 96/58  Pulse:  86 88 82  Resp:  20 (!) 21 18  Temp:   (!) 97.4 F (36.3 C) 97.6 F (36.4 C)  TempSrc:   Axillary Oral  SpO2: 92% 92% 92% 92%  Weight:      Height:        Intake/Output  Summary (Last 24 hours) at 09/24/2021 1904 Last data filed at 09/24/2021 1800 Gross per 24 hour  Intake 2296.32 ml  Output 1860 ml  Net 436.32 ml   Filed Weights   09/23/21 2025  Weight: 57.4 kg    Examination:   Constitutional: NAD, AAOx3 HEENT: conjunctivae and lids normal, EOMI CV: No cyanosis.   RESP: cough with sputum production, on 6L Extremities: No effusions, edema in BLE SKIN: warm, dry Neuro: II - XII grossly intact.   Psych: Normal mood and affect.  Appropriate judgement and reason   Data Reviewed: I have personally reviewed following labs and imaging studies  CBC: Recent Labs  Lab 09/23/21 1424 09/24/21 0451  WBC 23.4* 18.0*  NEUTROABS 20.2*  --   HGB 11.5* 9.9*  HCT 38.9 33.8*  MCV 80.9 82.0  PLT 191 149*   Basic Metabolic Panel: Recent Labs  Lab 09/23/21 1424 09/24/21 0451  NA 138 142  K 4.3 4.1  CL 99 108  CO2 20* 25  GLUCOSE 192* 127*  BUN 36* 32*  CREATININE 1.84* 1.05*  CALCIUM 9.7 8.5*  MG  --  1.8  PHOS  --  4.8*   GFR: Estimated Creatinine Clearance: 50.6 mL/min (A) (by C-G formula based on SCr of 1.05 mg/dL (H)). Liver Function Tests: Recent Labs  Lab 09/23/21 1424  AST 33  ALT 14  ALKPHOS 85  BILITOT 0.5  PROT 8.1  ALBUMIN  3.8   No results for input(s): LIPASE, AMYLASE in the last 168 hours. No results for input(s): AMMONIA in the last 168 hours. Coagulation Profile: No results for input(s): INR, PROTIME in the last 168 hours. Cardiac Enzymes: No results for input(s): CKTOTAL, CKMB, CKMBINDEX, TROPONINI in the last 168 hours. BNP (last 3 results) No results for input(s): PROBNP in the last 8760 hours. HbA1C: No results for input(s): HGBA1C in the last 72 hours. CBG: Recent Labs  Lab 09/23/21 2014  GLUCAP 134*   Lipid Profile: No results for input(s): CHOL, HDL, LDLCALC, TRIG, CHOLHDL, LDLDIRECT in the last 72 hours. Thyroid Function Tests: No results for input(s): TSH, T4TOTAL, FREET4, T3FREE, THYROIDAB in the  last 72 hours. Anemia Panel: No results for input(s): VITAMINB12, FOLATE, FERRITIN, TIBC, IRON, RETICCTPCT in the last 72 hours. Sepsis Labs: Recent Labs  Lab 09/23/21 1424 09/23/21 1724 09/23/21 2117 09/23/21 2328 09/24/21 0451  PROCALCITON  --   --   --   --  10.68  LATICACIDVEN 4.8* 1.8 2.0* 1.1  --     Recent Results (from the past 240 hour(s))  Blood Culture (routine x 2)     Status: None (Preliminary result)   Collection Time: 09/23/21  2:24 PM   Specimen: BLOOD  Result Value Ref Range Status   Specimen Description BLOOD RIGHT ANTECUBITAL  Final   Special Requests   Final    BOTTLES DRAWN AEROBIC AND ANAEROBIC Blood Culture results may not be optimal due to an inadequate volume of blood received in culture bottles   Culture   Final    NO GROWTH < 24 HOURS Performed at [facility], [address] [place], [place] [address]    Report Status PENDING  Incomplete  MRSA Next Gen by PCR, Nasal     Status: Abnormal   Collection Time: 09/23/21  8:20 PM   Specimen: Nasal Mucosa; Nasal Swab  Result Value Ref Range Status   MRSA by PCR Next Gen DETECTED (A) NOT DETECTED Final    Comment: RESULT CALLED TO, READ BACK BY AND VERIFIED WITH: Paula ComptonKARLA [facility] IncWINTERS 09/23/21 2207 MU (NOTE) The GeneXpert MRSA Assay (FDA approved for NASAL specimens only), is one component of a comprehensive MRSA colonization surveillance program. It is not intended to diagnose MRSA infection nor to guide or monitor treatment for MRSA infections. Test performance is not FDA approved in patients less than 62 years old. Performed at [facility], [address] [place], [place] [address]   Culture, blood (Routine X 2) w Reflex to ID Panel     Status: None (Preliminary result)   Collection Time: 09/23/21  9:20 PM   Specimen: BLOOD  Result Value Ref Range Status   Specimen Description BLOOD RIGHT HAND  Final   Special Requests   Final    BOTTLES DRAWN AEROBIC AND ANAEROBIC Blood Culture  adequate volume   Culture   Final    NO GROWTH < 12 HOURS Performed at [facility], [address] [place], [place] [address]    Report Status PENDING  Incomplete  Respiratory (~20 pathogens) panel by PCR     Status: None   Collection Time: 09/23/21  9:33 PM   Specimen: Nasopharyngeal Swab; Respiratory  Result Value Ref Range Status   Adenovirus NOT DETECTED NOT DETECTED Final   Coronavirus 229E NOT DETECTED NOT DETECTED Final    Comment: (NOTE) The Coronavirus on the Respiratory Panel, DOES NOT test for the novel  Coronavirus (2019 nCoV)  Coronavirus HKU1 NOT DETECTED NOT DETECTED Final   Coronavirus NL63 NOT DETECTED NOT DETECTED Final   Coronavirus OC43 NOT DETECTED NOT DETECTED Final   Metapneumovirus NOT DETECTED NOT DETECTED Final   Rhinovirus / Enterovirus NOT DETECTED NOT DETECTED Final   Influenza A NOT DETECTED NOT DETECTED Final   Influenza B NOT DETECTED NOT DETECTED Final   Parainfluenza Virus 1 NOT DETECTED NOT DETECTED Final   Parainfluenza Virus 2 NOT DETECTED NOT DETECTED Final   Parainfluenza Virus 3 NOT DETECTED NOT DETECTED Final   Parainfluenza Virus 4 NOT DETECTED NOT DETECTED Final   Respiratory Syncytial Virus NOT DETECTED NOT DETECTED Final   Bordetella pertussis NOT DETECTED NOT DETECTED Final   Bordetella Parapertussis NOT DETECTED NOT DETECTED Final   Chlamydophila pneumoniae NOT DETECTED NOT DETECTED Final   Mycoplasma pneumoniae NOT DETECTED NOT DETECTED Final    Comment: Performed at [facility], [address], [place], [place] [address]  SARS Coronavirus 2 by RT PCR (hospital order, performed in [facility] hospital lab) *cepheid single result test* Anterior Nasal Swab     Status: None   Collection Time: 09/24/21 11:48 AM   Specimen: Anterior Nasal Swab  Result Value Ref Range Status   SARS Coronavirus 2 by RT PCR NEGATIVE NEGATIVE Final    Comment: (NOTE) SARS-CoV-2 target nucleic acids are NOT DETECTED.  The SARS-CoV-2  RNA is generally detectable in upper and lower respiratory specimens during the acute phase of infection. The lowest concentration of SARS-CoV-2 viral copies this assay can detect is 250 copies / mL. A negative result does not preclude SARS-CoV-2 infection and should not be used as the sole basis for treatment or other patient management decisions.  A negative result may occur with improper specimen collection / handling, submission of specimen other than nasopharyngeal swab, presence of viral mutation(s) within the areas targeted by this assay, and inadequate number of viral copies (<250 copies / mL). A negative result must be combined with clinical observations, patient history, and epidemiological information.  Fact Sheet for Patients:   RoadLapTop.co.za  Fact Sheet for Healthcare Providers: http://kim-miller.com/  This test is not yet approved or  cleared by the [place] FDA and has been authorized for detection and/or diagnosis of SARS-CoV-2 by FDA under an Emergency Use Authorization (EUA).  This EUA will remain in effect (meaning this test can be used) for the duration of the COVID-19 declaration under Section 564(b)(1) of the Act, 21 U.S.C. section 360bbb-3(b)(1), unless the authorization is terminated or revoked sooner.  Performed at [facility], [address] [place], [place] [address]   Expectorated Sputum Assessment w Gram Stain, Rflx to Resp Cult     Status: None   Collection Time: 09/24/21 12:28 PM   Specimen: Expectorated Sputum  Result Value Ref Range Status   Specimen Description EXPECTORATED SPUTUM  Final   Special Requests NONE  Final   Sputum evaluation   Final    THIS SPECIMEN IS ACCEPTABLE FOR SPUTUM CULTURE Performed at [facility], [address], [place], [place] [address]    Report Status 09/24/2021 FINAL  Final  Culture, Respiratory w Gram Stain     Status: None (Preliminary  result)   Collection Time: 09/24/21 12:28 PM  Result Value Ref Range Status   Specimen Description   Final    EXPECTORATED SPUTUM Performed at [facility], [address], [place], [place] [address]    Special Requests   Final    NONE  Reflexed from [phone] Performed at [facility], [address] [place], [place] [address]    Gram Stain   Final    ABUNDANT WBC PRESENT, PREDOMINANTLY PMN RARE GRAM POSITIVE COCCI RARE BUDDING YEAST SEEN Performed at [facility], [address] [place], [place] [address]    Culture PENDING  Incomplete   Report Status PENDING  Incomplete      Radiology Studies: CT Angio Chest PE W and/or Wo Contrast  Result Date: 09/23/2021 CLINICAL DATA:  Pulmonary embolus suspected EXAM: CT ANGIOGRAPHY CHEST WITH CONTRAST TECHNIQUE: Multidetector CT imaging of the chest was performed using the standard protocol during bolus administration of intravenous contrast. Multiplanar CT image reconstructions and MIPs were obtained to evaluate the vascular anatomy. RADIATION DOSE REDUCTION: This exam was performed according to the departmental dose-optimization program which includes automated exposure control, adjustment of the mA and/or kV according to patient size and/or use of iterative reconstruction technique. CONTRAST:  40mL OMNIPAQUE IOHEXOL 350 MG/ML SOLN COMPARISON:  Chest CT dated July 19, 2021 FINDINGS: Cardiovascular: Adequate contrast opacification of the pulmonary arteries. No evidence of pulmonary embolus. Heart is upper limits of normal in size. No pericardial effusion. Left main and three-vessel coronary artery calcifications. Atherosclerotic disease of the thoracic aorta. Mediastinum/Nodes: Large hiatal hernia. Mediastinal and bilateral hilar adenopathy. Mediastinal adenopathy is similar when compared with prior exam. Reference right lower paratracheal lymph node measuring 1.7 cm in short axis on series 6, image 176. New bilateral hilar  lymphadenopathy. Lungs/Pleura: Centrilobular emphysema. Complete occlusion of the right lower lobe bronchus with near complete collapse of the right lower lobe. New patchy bilateral consolidations. Bilateral small solid pulmonary nodules are similar to recent prior exam. Upper Abdomen: No acute abnormality. Musculoskeletal: Unchanged T1 compression fracture and sternal fracture. No acute osseous abnormality. Review of the MIP images confirms the above findings. IMPRESSION: 1. No evidence of pulmonary embolus. 2. New bilateral patchy consolidations, concerning for multifocal pneumonia. 3. Near complete collapse of the right lower lobe and occlusion of the right lower lobe bronchus, likely due to aspiration. 4. Increased size of hilar lymph nodes, likely reactive. 5. Bilateral small solid pulmonary nodules are similar to recent prior exam and likely due to background chronic atypical infection. 6. Aortic Atherosclerosis (ICD10-I70.0) and Emphysema (ICD10-J43.9). Electronically Signed   By: Allegra Tola Meas M.D.   On: 09/23/2021 18:30   DG Chest Port 1 View  Result Date: 09/23/2021 CLINICAL DATA:  Questionable sepsis - evaluate for abnormality EXAM: PORTABLE CHEST 1 VIEW COMPARISON:  July 19, 2021. FINDINGS: New consolidation in the left midlung. Additional reticulonodular opacities are similar. New probable right lower lobe collapse. No definite pleural effusions or pneumothorax. Cardiomediastinal silhouette is similar. Polyarticular degenerative change. IMPRESSION: 1. New consolidation in the left midlung , concerning for pneumonia. Followup PA and lateral chest X-ray is recommended in 3-4 weeks following trial of antibiotic therapy to ensure resolution and exclude underlying malignancy. 2. New probable right lower lobe collapse. 3. 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 Electronically Signed   By: Feliberto Harts M.D.   On: 09/23/2021 14:41     Scheduled Meds:  budesonide (PULMICORT) nebulizer solution  0.5 mg Nebulization BID   Chlorhexidine Gluconate Cloth  6 each Topical Q0600   dextromethorphan-guaiFENesin  1 tablet Oral BID   enoxaparin (LOVENOX) injection  40 mg Subcutaneous Q24H   gabapentin  300 mg Oral TID   ipratropium-albuterol  3 mL  Nebulization Q6H   magnesium oxide  400 mg Oral Daily   methylPREDNISolone (SOLU-MEDROL) injection  20 mg Intravenous Q12H   pantoprazole  40 mg Oral Daily   sertraline  150 mg Oral Daily   Continuous Infusions:  sodium chloride 50 mL/hr at 09/24/21 1024   cefTRIAXone (ROCEPHIN)  IV 2 g (09/24/21 0847)     LOS: 1 day     Darlin Priestly, MD [facility] If 7PM-7AM, please contact night-coverage 09/24/2021, 7:04 PM

## 2021-09-24 NOTE — Progress Notes (Addendum)
Pharmacy Antibiotic Note  Julia [name] is a 61 y.o. female admitted on 09/23/2021 with sepsis.  Pharmacy has been consulted for Cefepime dosing.  Plan: Cefepime 2 gm IV X 1 given on 5/24 @ 1623. Cefepime 2 gm IV Q24H ordered to continue on 5/25 @ 1600.   Vancomycin 1250 mg IV X 1 ordered for 5/25 @ ~ 0800. Vancomycin 1 gm IV Q24H ordered to start on 5/26 @ 0800.  AUC = 521.2  Vanc trough = 12.  Height: 5\' 5"  (165.1 cm) Weight: 57.4 kg (126 lb 8.7 oz) IBW/kg (Calculated) : 57  Temp (24hrs), Avg:97.2 F (36.2 C), Min:96.3 F (35.7 C), Max:98.1 F (36.7 C)  Recent Labs  Lab 09/23/21 1424 09/23/21 1724 09/23/21 2117 09/23/21 2328  WBC 23.4*  --   --   --   CREATININE 1.84*  --   --   --   LATICACIDVEN 4.8* 1.8 2.0* 1.1    Estimated Creatinine Clearance: 28.9 mL/min (A) (by C-G formula based on SCr of 1.84 mg/dL (H)).    Allergies  Allergen Reactions   Banana Nausea And Vomiting    Antimicrobials this admission:   >>    >>   Dose adjustments this admission:   Microbiology results:  BCx:   UCx:    Sputum:    MRSA PCR:   Thank you for allowing pharmacy to be a part of this patient's care.  Deryck Hippler D 09/24/2021 12:51 AM

## 2021-09-24 NOTE — Plan of Care (Signed)
°  Problem: Fluid Volume: °Goal: Hemodynamic stability will improve °Outcome: Not Progressing °  °Problem: Clinical Measurements: °Goal: Diagnostic test results will improve °Outcome: Not Progressing °Goal: Signs and symptoms of infection will decrease °Outcome: Not Progressing °  °Problem: Respiratory: °Goal: Ability to maintain adequate ventilation will improve °Outcome: Not Progressing °  °

## 2021-09-24 NOTE — Progress Notes (Signed)
PT was placed on BIPAP with Neb Tx administered via inline. PT had increased WOB prior, while on 6L [place]. PT was talking to spouse and also doing flutter valve. PT was instructed to pause in-between breaths due to drop in SpO2 (88%). PT WOB and SpO2 (93%) improved after being placed on BIPAP.

## 2021-09-24 NOTE — Consult Note (Signed)
NAME:  Julia [name], MRN:  [id number], DOB:  03-14-1961, LOS: 1 ADMISSION DATE:  09/23/2021, CONSULTATION DATE:  09/23/21 REFERRING MD:  Hospitalist, CHIEF COMPLAINT:  COPD exacerbation   History of Present Illness:   61 y.o. female with a known history of COPD, depression, anxiety presents to the emergency department for evaluation of chest pain, SOB.  Patient was in a usual state of health until 2-3 days ago when she has developed worsening chest discomfort, worse with inspiration, refractory to home medications including inhalers and pain meds. CTA chest was done in the ED which showed no PE but revealed new bilateral patchy consolidations, concerning for multifocal pneumonia. In addition, lab worked revealed and a lactate of 4.8 and AKI (Cr 1.84, no prior CKD) concerning for sepsis. Patient received azithromycin, Cefepime and was placed on BIPAP. The patient was admitted as stepdown by the hospitalist team and critical care was consulted for evaluation.   Patient seen and examined on arrival to the ICU. She is alert and oriented x3, in no distress on BIPAP with stable vital signs. Patient states she feels a lot better on the BIPAP although it is uncomfortable. She does complain of some generalized aches pains, but states she has chronic pain at baseline. She denies any chest pain, nausea or vomiting.   Chart reviewed. Significant lab values include: CO2 20, Glucose 192, Lactate 4.8-->1.1, Cr 1.84, Anion Gap 19, WBC 23.4  Pertinent  Medical History  COPD Depression  Anxiety  Significant Hospital Events: Including procedures, antibiotic start and stop dates in addition to other pertinent events   Admitted 5/24 by hospitalist team sepsis 2/2 pneumonia  Interim History / Subjective:  61yo female w/hx of COPD who presented to ED with 2-3 days of worsening shortness of breath. Imaging revealed multifocal pneumonia. Lactate elevated and AKI. Admitted for sepsis 2/2 multifocal pneumonia and  COPD exacerbation.   Objective   Blood pressure 91/66, pulse 77, temperature (!) 96.3 F (35.7 C), temperature source Axillary, resp. rate (!) 23, height 5\' 5"  (1.651 m), weight 57.4 kg, SpO2 99 %.    FiO2 (%):  [40 %] 40 %  No intake or output data in the 24 hours ending 09/24/21 0006 Filed Weights   09/23/21 2025  Weight: 57.4 kg    Examination: General: Alert and oriented, in no distress, on BIPAP HENT: Pupils equal and reactive, sclera clear, normocephalic Lungs: Lungs coarse and diminshed throughout Cardiovascular: Heart sounds S1S2, no edema, warm and palpable pulses Abdomen: Soft, non-tender, bowel sounds active Extremities: No deformities Neuro: no focal deficits   Resolved Hospital Problem list   Elevated Lactate cleared  Assessment & Plan:  Acute hypoxemic respiratory failure 2/2 Multifocal pneumonia COPD Exacerbation --Continue BIPAP overnight with plans to wean in AM if tolerated --ABG pending --Continue Cefepime and Azithromycin --Inhalers and PRN Duonebs --Solumedrol 60mg  IV q6hr --Follow up sputum cultures, urine legionella and strep Ag  Sepsis --2/2 to above --Lactate 4.8-->1.1, will stop trending --Cultures pending --Abxs as above --Received fluids in ED  Acute Kidney Injury --2/2 to shock state from above --Cr 1.84, baseline normal --Received crystalloid in ED --Monitor BMP daily --Avoid nephrotoxins  Anxiety Depression --Home medications: Zoloft 150mg  daily continued  FEN: --No MIVFs --Replace per provider order --NPO for now, pending hospital course may eat in AM  Family Engagement: No family present CODE STATUS: Full Code Dispo: To remain in ICU and critical care to follow  Best Practice (right click and "Reselect all SmartList Selections" daily)  Diet/type: NPO DVT prophylaxis: LMWH GI prophylaxis: PPI Lines: N/A Foley:  N/A Code Status:  full code Last date of multidisciplinary goals of care discussion [NA]  Labs    CBC: Recent Labs  Lab 09/23/21 1424  WBC 23.4*  NEUTROABS 20.2*  HGB 11.5*  HCT 38.9  MCV 80.9  PLT 99991111    Basic Metabolic Panel: Recent Labs  Lab 09/23/21 1424  NA 138  K 4.3  CL 99  CO2 20*  GLUCOSE 192*  BUN 36*  CREATININE 1.84*  CALCIUM 9.7   GFR: Estimated Creatinine Clearance: 28.9 mL/min (A) (by C-G formula based on SCr of 1.84 mg/dL (H)). Recent Labs  Lab 09/23/21 1424 09/23/21 1724 09/23/21 2117 09/23/21 2328  WBC 23.4*  --   --   --   LATICACIDVEN 4.8* 1.8 2.0* 1.1    Liver Function Tests: Recent Labs  Lab 09/23/21 1424  AST 33  ALT 14  ALKPHOS 85  BILITOT 0.5  PROT 8.1  ALBUMIN 3.8   No results for input(s): LIPASE, AMYLASE in the last 168 hours. No results for input(s): AMMONIA in the last 168 hours.  ABG    Component Value Date/Time   PHART 7.35 11/09/2015 0444   PCO2ART 50 (H) 11/09/2015 0444   PO2ART 146 (H) 11/09/2015 0444   HCO3 26.2 09/23/2021 1510   ACIDBASEDEF 0.8 09/23/2021 1510   O2SAT PENDING 09/23/2021 1510     Coagulation Profile: No results for input(s): INR, PROTIME in the last 168 hours.  Cardiac Enzymes: No results for input(s): CKTOTAL, CKMB, CKMBINDEX, TROPONINI in the last 168 hours.  HbA1C: Hemoglobin A1C  Date/Time Value Ref Range Status  03/13/2014 04:26 AM 6.7 (H) 4.2 - 6.3 % Final    Comment:    The American Diabetes Association recommends that a primary goal of therapy should be <7% and that physicians should reevaluate the treatment regimen in patients with HbA1c values consistently >8%.     CBG: No results for input(s): GLUCAP in the last 168 hours.  Review of Systems:   Negative except stated in HPI  Past Medical History:  She,  has a past medical history of Anemia, Anxiety, Asthma, COPD (chronic obstructive pulmonary disease) ([place]), Depression, and Ventral hernia.   Surgical History:   Past Surgical History:  Procedure Laterality Date   COLON SURGERY     EAR TUBE REMOVAL      FRACTURE SURGERY     HERNIA REPAIR     TUBAL LIGATION       Social History:   reports that she quit smoking about 17 months ago. Her smoking use included cigarettes. She smoked an average of .5 packs per day. She has never used smokeless tobacco. She reports that she does not drink alcohol and does not use drugs.   Family History:  Her family history includes Alcohol abuse in her father; Sarcoidosis in her mother.   Allergies Allergies  Allergen Reactions   Banana Nausea And Vomiting     Home Medications  Prior to Admission medications   Medication Sig Start Date End Date Taking? Authorizing Provider  acetaminophen (TYLENOL) 500 MG tablet Take 1,000 mg by mouth every 6 (six) hours as needed.    [provider]  albuterol (PROVENTIL HFA;VENTOLIN HFA) 108 (90 BASE) MCG/ACT inhaler Inhale 2 puffs into the lungs every 6 (six) hours as needed for wheezing or shortness of breath. 04/09/15   Bettey Costa, MD  albuterol (PROVENTIL) (2.5 MG/3ML) 0.083% nebulizer solution Take 2.5 mg by  nebulization every 6 (six) hours as needed for wheezing or shortness of breath.    [provider]  ALPRAZolam Duanne Moron) 0.5 MG tablet Take 0.5-1 mg by mouth 2 (two) times daily as needed for anxiety. 12/17/20 12/17/21  [provider]  dextromethorphan-guaiFENesin (MUCINEX DM) 30-600 MG 12hr tablet Take 1 tablet by mouth 2 (two) times daily. 03/20/21   Lavina Hamman, MD  docusate sodium (COLACE) 100 MG capsule Take 100 mg by mouth daily as needed for mild constipation.    [provider]  gabapentin (NEURONTIN) 300 MG capsule Take 300 mg by mouth 3 (three) times daily. 12/17/20   [provider]  guaiFENesin (ROBITUSSIN) 100 MG/5ML liquid Take 5 mLs by mouth every 4 (four) hours as needed for cough or to loosen phlegm. 07/21/21   Max Sane, MD  magnesium oxide (MAG-OX) 400 MG tablet Take 400 mg by mouth daily.    [provider]  pantoprazole (PROTONIX) 40 MG  tablet Take 40 mg by mouth daily.    [provider]  predniSONE (STERAPRED UNI-PAK 21 TAB) 10 MG (21) TBPK tablet Start 60 mg po daily, taper 10 mg daily until finish 07/21/21   Max Sane, MD  sertraline (ZOLOFT) 100 MG tablet Take 150 mg by mouth daily.    [provider]  TRELEGY ELLIPTA 100-62.5-25 MCG/ACT AEPB Inhale 1 puff into the lungs daily. 07/10/21   [provider]     Critical care time: 45 minutes     Tonye Royalty ACNP-BC

## 2021-09-25 ENCOUNTER — Inpatient Hospital Stay: Payer: 59

## 2021-09-25 DIAGNOSIS — A419 Sepsis, unspecified organism: Secondary | ICD-10-CM | POA: Diagnosis not present

## 2021-09-25 DIAGNOSIS — J189 Pneumonia, unspecified organism: Secondary | ICD-10-CM | POA: Diagnosis not present

## 2021-09-25 LAB — CBC
HCT: 30.1 % — ABNORMAL LOW (ref 36.0–46.0)
Hemoglobin: 8.9 g/dL — ABNORMAL LOW (ref 12.0–15.0)
MCH: 24.4 pg — ABNORMAL LOW (ref 26.0–34.0)
MCHC: 29.6 g/dL — ABNORMAL LOW (ref 30.0–36.0)
MCV: 82.5 fL (ref 80.0–100.0)
Platelets: 146 10*3/uL — ABNORMAL LOW (ref 150–400)
RBC: 3.65 MIL/uL — ABNORMAL LOW (ref 3.87–5.11)
RDW: 16.7 % — ABNORMAL HIGH (ref 11.5–15.5)
WBC: 18.7 10*3/uL — ABNORMAL HIGH (ref 4.0–10.5)
nRBC: 0 % (ref 0.0–0.2)

## 2021-09-25 LAB — BLOOD GAS, VENOUS
Acid-base deficit: 0.8 mmol/L (ref 0.0–2.0)
Bicarbonate: 26.2 mmol/L (ref 20.0–28.0)
Patient temperature: 37
pCO2, Ven: 52 mmHg (ref 44–60)
pH, Ven: 7.31 (ref 7.25–7.43)

## 2021-09-25 LAB — BASIC METABOLIC PANEL
Anion gap: 5 (ref 5–15)
BUN: 41 mg/dL — ABNORMAL HIGH (ref 8–23)
CO2: 25 mmol/L (ref 22–32)
Calcium: 8.7 mg/dL — ABNORMAL LOW (ref 8.9–10.3)
Chloride: 110 mmol/L (ref 98–111)
Creatinine, Ser: 0.75 mg/dL (ref 0.44–1.00)
GFR, Estimated: >60 mL/min (ref >60)
Glucose, Bld: 133 mg/dL — ABNORMAL HIGH (ref 70–99)
Potassium: 3.8 mmol/L (ref 3.5–5.1)
Sodium: 140 mmol/L (ref 135–145)

## 2021-09-25 LAB — LEGIONELLA PNEUMOPHILA SEROGP 1 UR AG: L. pneumophila Serogp 1 Ur Ag: NEGATIVE

## 2021-09-25 LAB — GLUCOSE, CAPILLARY: Glucose-Capillary: 206 mg/dL — ABNORMAL HIGH (ref 70–99)

## 2021-09-25 LAB — PROCALCITONIN: Procalcitonin: 6.17 ng/mL

## 2021-09-25 LAB — MAGNESIUM: Magnesium: 2.1 mg/dL (ref 1.7–2.4)

## 2021-09-25 MED ORDER — HYDROMORPHONE HCL 1 MG/ML IJ SOLN: 0.2500 mg | INTRAMUSCULAR | Status: DC | PRN

## 2021-09-25 MED ORDER — ALBUTEROL (5 MG/ML) CONTINUOUS INHALATION SOLN
10.0000 mg/h | INHALATION_SOLUTION | RESPIRATORY_TRACT | Status: DC
  Administered 2021-09-25: 10 mg/h via RESPIRATORY_TRACT
  Filled 2021-09-25: qty 20

## 2021-09-25 MED ORDER — CHLORHEXIDINE GLUCONATE CLOTH 2 % EX PADS
6.0000 | MEDICATED_PAD | Freq: Every day | CUTANEOUS | Status: DC
  Administered 2021-09-25 – 2021-09-27 (×2): 6 via TOPICAL

## 2021-09-25 MED ORDER — MUPIROCIN 2 % EX OINT
1.0000 "application " | TOPICAL_OINTMENT | Freq: Two times a day (BID) | CUTANEOUS | Status: AC
  Administered 2021-09-25 – 2021-09-29 (×10): 1 via NASAL
  Filled 2021-09-25 (×3): qty 22

## 2021-09-25 MED ORDER — FUROSEMIDE 10 MG/ML IJ SOLN
40.0000 mg | Freq: Once | INTRAMUSCULAR | Status: AC
  Administered 2021-09-25: 40 mg via INTRAVENOUS
  Filled 2021-09-25: qty 4

## 2021-09-25 MED ORDER — ALBUTEROL SULFATE (2.5 MG/3ML) 0.083% IN NEBU
15.0000 mg/h | INHALATION_SOLUTION | RESPIRATORY_TRACT | Status: DC
  Administered 2021-09-25: 15 mg/h via RESPIRATORY_TRACT
  Filled 2021-09-25: qty 15
  Filled 2021-09-25 (×2): qty 3
  Filled 2021-09-25: qty 20
  Filled 2021-09-25: qty 12

## 2021-09-25 MED ORDER — MORPHINE SULFATE (PF) 2 MG/ML IV SOLN
1.0000 mg | INTRAVENOUS | Status: DC | PRN
  Administered 2021-09-25: 1 mg via INTRAVENOUS
  Filled 2021-09-25: qty 1

## 2021-09-25 MED ORDER — HYDROMORPHONE HCL 1 MG/ML IJ SOLN
0.2500 mg | Freq: Once | INTRAMUSCULAR | Status: AC
  Administered 2021-09-25: 0.25 mg via INTRAVENOUS
  Filled 2021-09-25: qty 1

## 2021-09-25 MED ORDER — METHYLPREDNISOLONE SODIUM SUCC 40 MG IJ SOLR
40.0000 mg | Freq: Two times a day (BID) | INTRAMUSCULAR | Status: DC
  Administered 2021-09-25 – 2021-09-28 (×6): 40 mg via INTRAVENOUS
  Filled 2021-09-25 (×6): qty 1

## 2021-09-25 MED ORDER — METHYLPREDNISOLONE SODIUM SUCC 125 MG IJ SOLR
80.0000 mg | Freq: Once | INTRAMUSCULAR | Status: AC
  Administered 2021-09-25: 80 mg via INTRAVENOUS
  Filled 2021-09-25: qty 2

## 2021-09-25 NOTE — Progress Notes (Signed)
PROGRESS NOTE    Julia [name]  [id number] DOB: 1960/05/25 DOA: 09/23/2021 PCP: Romualdo Bolk, FNP  [id number]/[id number]   Assessment & Plan:   Principal Problem:   Sepsis due to pneumonia ([place])   Julia [name] is a 61 y.o. female with a known history of COPD, depression, anxiety presents to the emergency department for evaluation of chest pain, SOB.  Patient was in a usual state of health until 2-3 days ago when she has developed worsening chest discomfort, worse with inspiration, refractory to home medications including inhalers and pain meds.    Severe Sepsis secondary to multifocal pneumonia --tachycardia, tachypnea, leukocytosis, AKI --tx with abx  Multifocal PNA 2/2 strep pneumo --strep pneumo antigen pos --cont ceftriaxone  COPD exacerbation --started on IV solumedrol and DuoNeb --cont IV solumedrol with taper --cont DuoNeb  Acute on chronic hypoxemic respiratory failure --pt uses 2.5L O2 nightly at baseline --On presentation, needed BiPAP, weaned down to 6L O2 next day, however back on BiPAP again this morning, likely due to fluid overload. --Continue supplemental O2 to keep sats between 88-92%, wean as tolerated  Fluid overload --from IVF --IV lasix 40  #. AKI --Cr 1.84 in presentation.  Improved to 1.05 with IVF --d/c MIVF   DVT prophylaxis: Lovenox SQ Code Status: Full code  Family Communication:  Level of care: Stepdown Dispo:   The patient is from: home Anticipated d/c is to: home Anticipated d/c date is: 2-3 days Patient currently is not medically ready to d/c due to: IV abx for PNA, on BiPAP   Subjective and Interval History:  Pt had worsened hypoxia this morning and needed to be put back on BiPAP.  Continued to produce tan-colored thick sputum.  Pt likely was fluid overloaded from the sepsis IVF.  Pt was given IV Lasix 40 and transferred to stepdown (no bed in progressive unit).   Objective: Vitals:   09/25/21 1108 09/25/21 1200  09/25/21 1300 09/25/21 1350  BP: 134/73 124/69 131/81   Pulse: 61 82 80   Resp:  (!) 25 (!) 25   Temp:  98.9 F (37.2 C)    TempSrc:  Axillary    SpO2: 99% 97% 95% 93%  Weight:      Height:        Intake/Output Summary (Last 24 hours) at 09/25/2021 1418 Last data filed at 09/24/2021 1945 Gross per 24 hour  Intake 484.6 ml  Output 250 ml  Net 234.6 ml   Filed Weights   09/23/21 2025  Weight: 57.4 kg    Examination:   Constitutional: in mild distress, AAOx3 HEENT: conjunctivae and lids normal, EOMI CV: No cyanosis.   RESP: on BiPAP SKIN: warm, dry Neuro: II - XII grossly intact.     Data Reviewed: I have personally reviewed following labs and imaging studies  CBC: Recent Labs  Lab 09/23/21 1424 09/24/21 0451 09/25/21 0521  WBC 23.4* 18.0* 18.7*  NEUTROABS 20.2*  --   --   HGB 11.5* 9.9* 8.9*  HCT 38.9 33.8* 30.1*  MCV 80.9 82.0 82.5  PLT 191 149* [id number]*   Basic Metabolic Panel: Recent Labs  Lab 09/23/21 1424 09/24/21 0451 09/25/21 0521  NA 138 142 140  K 4.3 4.1 3.8  CL 99 108 110  CO2 20* 25 25  GLUCOSE 192* 127* 133*  BUN 36* 32* 41*  CREATININE 1.84* 1.05* 0.75  CALCIUM 9.7 8.5* 8.7*  MG  --  1.8 2.1  PHOS  --  4.8*  --  GFR: Estimated Creatinine Clearance: 66.5 mL/min (by C-G formula based on SCr of 0.75 mg/dL). Liver Function Tests: Recent Labs  Lab 09/23/21 1424  AST 33  ALT 14  ALKPHOS 85  BILITOT 0.5  PROT 8.1  ALBUMIN 3.8   No results for input(s): LIPASE, AMYLASE in the last 168 hours. No results for input(s): AMMONIA in the last 168 hours. Coagulation Profile: No results for input(s): INR, PROTIME in the last 168 hours. Cardiac Enzymes: No results for input(s): CKTOTAL, CKMB, CKMBINDEX, TROPONINI in the last 168 hours. BNP (last 3 results) No results for input(s): PROBNP in the last 8760 hours. HbA1C: No results for input(s): HGBA1C in the last 72 hours. CBG: Recent Labs  Lab 09/23/21 2014 09/25/21 1129  GLUCAP 134*  206*   Lipid Profile: No results for input(s): CHOL, HDL, LDLCALC, TRIG, CHOLHDL, LDLDIRECT in the last 72 hours. Thyroid Function Tests: No results for input(s): TSH, T4TOTAL, FREET4, T3FREE, THYROIDAB in the last 72 hours. Anemia Panel: No results for input(s): VITAMINB12, FOLATE, FERRITIN, TIBC, IRON, RETICCTPCT in the last 72 hours. Sepsis Labs: Recent Labs  Lab 09/23/21 1424 09/23/21 1724 09/23/21 2117 09/23/21 2328 09/24/21 0451 09/25/21 0521  PROCALCITON  --   --   --   --  10.68 6.17  LATICACIDVEN 4.8* 1.8 2.0* 1.1  --   --     Recent Results (from the past 240 hour(s))  Blood Culture (routine x 2)     Status: None (Preliminary result)   Collection Time: 09/23/21  2:24 PM   Specimen: BLOOD  Result Value Ref Range Status   Specimen Description BLOOD RIGHT ANTECUBITAL  Final   Special Requests   Final    BOTTLES DRAWN AEROBIC AND ANAEROBIC Blood Culture results may not be optimal due to an inadequate volume of blood received in culture bottles   Culture   Final    NO GROWTH 2 DAYS Performed at [facility], [address] [place], [place] [address]    Report Status PENDING  Incomplete  MRSA Next Gen by PCR, Nasal     Status: Abnormal   Collection Time: 09/23/21  8:20 PM   Specimen: Nasal Mucosa; Nasal Swab  Result Value Ref Range Status   MRSA by PCR Next Gen DETECTED (A) NOT DETECTED Final    Comment: RESULT CALLED TO, READ BACK BY AND VERIFIED WITH: Florentina Jenny [facility] 09/23/21 2207 MU (NOTE) The GeneXpert MRSA Assay (FDA approved for NASAL specimens only), is one component of a comprehensive MRSA colonization surveillance program. It is not intended to diagnose MRSA infection nor to guide or monitor treatment for MRSA infections. Test performance is not FDA approved in patients less than 73 years old. Performed at [facility], [address] [place], [place] [address]   Culture, blood (Routine X 2) w Reflex to ID Panel     Status: None  (Preliminary result)   Collection Time: 09/23/21  9:20 PM   Specimen: BLOOD  Result Value Ref Range Status   Specimen Description BLOOD RIGHT HAND  Final   Special Requests   Final    BOTTLES DRAWN AEROBIC AND ANAEROBIC Blood Culture adequate volume   Culture   Final    NO GROWTH 2 DAYS Performed at [facility], [address], [place], [place] [address]    Report Status PENDING  Incomplete  Respiratory (~20 pathogens) panel by PCR     Status: None   Collection Time: 09/23/21  9:33 PM   Specimen: Nasopharyngeal  Swab; Respiratory  Result Value Ref Range Status   Adenovirus NOT DETECTED NOT DETECTED Final   Coronavirus 229E NOT DETECTED NOT DETECTED Final    Comment: (NOTE) The Coronavirus on the Respiratory Panel, DOES NOT test for the novel  Coronavirus (2019 nCoV)    Coronavirus HKU1 NOT DETECTED NOT DETECTED Final   Coronavirus NL63 NOT DETECTED NOT DETECTED Final   Coronavirus OC43 NOT DETECTED NOT DETECTED Final   Metapneumovirus NOT DETECTED NOT DETECTED Final   Rhinovirus / Enterovirus NOT DETECTED NOT DETECTED Final   Influenza A NOT DETECTED NOT DETECTED Final   Influenza B NOT DETECTED NOT DETECTED Final   Parainfluenza Virus 1 NOT DETECTED NOT DETECTED Final   Parainfluenza Virus 2 NOT DETECTED NOT DETECTED Final   Parainfluenza Virus 3 NOT DETECTED NOT DETECTED Final   Parainfluenza Virus 4 NOT DETECTED NOT DETECTED Final   Respiratory Syncytial Virus NOT DETECTED NOT DETECTED Final   Bordetella pertussis NOT DETECTED NOT DETECTED Final   Bordetella Parapertussis NOT DETECTED NOT DETECTED Final   Chlamydophila pneumoniae NOT DETECTED NOT DETECTED Final   Mycoplasma pneumoniae NOT DETECTED NOT DETECTED Final    Comment: Performed at [facility], [place]. [address], [place], [place] [address]  SARS Coronavirus 2 by RT PCR (hospital order, performed in [facility] hospital lab) *cepheid single result test* Anterior Nasal Swab     Status: None   Collection  Time: 09/24/21 11:48 AM   Specimen: Anterior Nasal Swab  Result Value Ref Range Status   SARS Coronavirus 2 by RT PCR NEGATIVE NEGATIVE Final    Comment: (NOTE) SARS-CoV-2 target nucleic acids are NOT DETECTED.  The SARS-CoV-2 RNA is generally detectable in upper and lower respiratory specimens during the acute phase of infection. The lowest concentration of SARS-CoV-2 viral copies this assay can detect is 250 copies / mL. A negative result does not preclude SARS-CoV-2 infection and should not be used as the sole basis for treatment or other patient management decisions.  A negative result may occur with improper specimen collection / handling, submission of specimen other than nasopharyngeal swab, presence of viral mutation(s) within the areas targeted by this assay, and inadequate number of viral copies (<250 copies / mL). A negative result must be combined with clinical observations, patient history, and epidemiological information.  Fact Sheet for Patients:   https://www.patel.info/  Fact Sheet for Healthcare Providers: https://hall.com/  This test is not yet approved or  cleared by the [place] FDA and has been authorized for detection and/or diagnosis of SARS-CoV-2 by FDA under an Emergency Use Authorization (EUA).  This EUA will remain in effect (meaning this test can be used) for the duration of the COVID-19 declaration under Section 564(b)(1) of the Act, 21 U.S.C. section 360bbb-3(b)(1), unless the authorization is terminated or revoked sooner.  Performed at [facility], [address] [place], [place] [address]   Expectorated Sputum Assessment w Gram Stain, Rflx to Resp Cult     Status: None   Collection Time: 09/24/21 12:28 PM   Specimen: Expectorated Sputum  Result Value Ref Range Status   Specimen Description EXPECTORATED SPUTUM  Final   Special Requests NONE  Final   Sputum evaluation   Final    THIS  SPECIMEN IS ACCEPTABLE FOR SPUTUM CULTURE Performed at [facility], [address] [place], [place] [address]    Report Status 09/24/2021 FINAL  Final  Culture, Respiratory w Gram Stain     Status: None (Preliminary result)  Collection Time: 09/24/21 12:28 PM  Result Value Ref Range Status   Specimen Description   Final    EXPECTORATED SPUTUM Performed at [facility], [address] [place], [place] [address]    Special Requests   Final    NONE Reflexed from [id number] Performed at [facility], [address] [place], [place] [address]    Gram Stain   Final    ABUNDANT WBC PRESENT, PREDOMINANTLY PMN RARE GRAM POSITIVE COCCI RARE BUDDING YEAST SEEN    Culture   Final    CULTURE REINCUBATED FOR BETTER GROWTH Performed at [facility], [address], [place], [place] [address]    Report Status PENDING  Incomplete      Radiology Studies: CT Angio Chest PE W and/or Wo Contrast  Result Date: 09/23/2021 CLINICAL DATA:  Pulmonary embolus suspected EXAM: CT ANGIOGRAPHY CHEST WITH CONTRAST TECHNIQUE: Multidetector CT imaging of the chest was performed using the standard protocol during bolus administration of intravenous contrast. Multiplanar CT image reconstructions and MIPs were obtained to evaluate the vascular anatomy. RADIATION DOSE REDUCTION: This exam was performed according to the departmental dose-optimization program which includes automated exposure control, adjustment of the mA and/or kV according to patient size and/or use of iterative reconstruction technique. CONTRAST:  51mL OMNIPAQUE IOHEXOL 350 MG/ML SOLN COMPARISON:  Chest CT dated July 19, 2021 FINDINGS: Cardiovascular: Adequate contrast opacification of the pulmonary arteries. No evidence of pulmonary embolus. Heart is upper limits of normal in size. No pericardial effusion. Left main and three-vessel coronary artery calcifications. Atherosclerotic disease of the thoracic aorta.  Mediastinum/Nodes: Large hiatal hernia. Mediastinal and bilateral hilar adenopathy. Mediastinal adenopathy is similar when compared with prior exam. Reference right lower paratracheal lymph node measuring 1.7 cm in short axis on series 6, image 176. New bilateral hilar lymphadenopathy. Lungs/Pleura: Centrilobular emphysema. Complete occlusion of the right lower lobe bronchus with near complete collapse of the right lower lobe. New patchy bilateral consolidations. Bilateral small solid pulmonary nodules are similar to recent prior exam. Upper Abdomen: No acute abnormality. Musculoskeletal: Unchanged T1 compression fracture and sternal fracture. No acute osseous abnormality. Review of the MIP images confirms the above findings. IMPRESSION: 1. No evidence of pulmonary embolus. 2. New bilateral patchy consolidations, concerning for multifocal pneumonia. 3. Near complete collapse of the right lower lobe and occlusion of the right lower lobe bronchus, likely due to aspiration. 4. Increased size of hilar lymph nodes, likely reactive. 5. Bilateral small solid pulmonary nodules are similar to recent prior exam and likely due to background chronic atypical infection. 6. Aortic Atherosclerosis (ICD10-I70.0) and Emphysema (ICD10-J43.9). Electronically Signed   By: Allegra Nevin Kozuch M.D.   On: 09/23/2021 18:30   DG Chest Port 1 View  Result Date: 09/23/2021 CLINICAL DATA:  Questionable sepsis - evaluate for abnormality EXAM: PORTABLE CHEST 1 VIEW COMPARISON:  July 19, 2021. FINDINGS: New consolidation in the left midlung. Additional reticulonodular opacities are similar. New probable right lower lobe collapse. No definite pleural effusions or pneumothorax. Cardiomediastinal silhouette is similar. Polyarticular degenerative change. IMPRESSION: 1. New consolidation in the left midlung , concerning for pneumonia. Followup PA and lateral chest X-ray is recommended in 3-4 weeks following trial of antibiotic therapy to ensure  resolution and exclude underlying malignancy. 2. New probable right lower lobe collapse. 3. 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 Electronically Signed   By: Feliberto Harts M.D.   On: 09/23/2021 14:41  Scheduled Meds:  budesonide (PULMICORT) nebulizer solution  0.5 mg Nebulization BID   Chlorhexidine Gluconate Cloth  6 each Topical Q0600   dextromethorphan-guaiFENesin  1 tablet Oral BID   enoxaparin (LOVENOX) injection  40 mg Subcutaneous Q24H   gabapentin  300 mg Oral TID   ipratropium-albuterol  3 mL Nebulization Q6H   magnesium oxide  400 mg Oral Daily   methylPREDNISolone (SOLU-MEDROL) injection  40 mg Intravenous Q12H   mupirocin ointment  1 application. Nasal BID   pantoprazole  40 mg Oral Daily   sertraline  150 mg Oral Daily   Continuous Infusions:  albuterol 15 mg/hr (09/25/21 1247)   albuterol 10 mg/hr (09/25/21 1417)   cefTRIAXone (ROCEPHIN)  IV 2 g (09/25/21 0954)     LOS: 2 days     Enzo Bi, MD [facility] If 7PM-7AM, please contact night-coverage 09/25/2021, 2:18 PM

## 2021-09-25 NOTE — Progress Notes (Signed)
Annice Pih [facility] requested for this charge RN to evaluate this patient for possible move to ICU. Upon entering room the patient was in respiratory distress on bipap. Alert and orient though lethargic. Reports it is difficult for her to breathe and catch her breath. Wheezing and rhonchorous lung sounds with O2 saturations 92-95 on bipap. Secure chat message sent to Dr. Fran Lowes expressing concern for her airway safety and stability. Dr. Fran Lowes placed transfer to ICU as stepdown status.   She was transferred to ICU room 1, remains on bipap, provided breathing treatments and supportive medications (see MAR). Patient has appeared more comfortable and is breathing more comfortably following interventions.

## 2021-09-25 NOTE — Progress Notes (Signed)
Notified by CN pt had been placed on bipap and had orders to transfer to PCU, but no beds. Spoke with Asher Muir, ICU charge RN and made her aware of the situation. Said she would see patient.

## 2021-09-25 NOTE — Progress Notes (Cosign Needed Addendum)
NAME:  Julia [name], MRN:  [id number], DOB:  [birth year], LOS: 2 ADMISSION DATE:  09/23/2021, CONSULTATION DATE:  09/23/21 REFERRING MD:  Hospitalist, CHIEF COMPLAINT:  COPD exacerbation   History of Present Illness:   61 y.o. female with a known history of COPD, depression, anxiety presents to the emergency department for evaluation of chest pain, SOB.  Patient was in a usual state of health until 2-3 days ago when she has developed worsening chest discomfort, worse with inspiration, refractory to home medications including inhalers and pain meds. CTA chest was done in the ED which showed no PE but revealed new bilateral patchy consolidations, concerning for multifocal pneumonia. In addition, lab worked revealed and a lactate of 4.8 and AKI (Cr 1.84, no prior CKD) concerning for sepsis. Patient received azithromycin, Cefepime and was placed on BIPAP. The patient was admitted as stepdown by the hospitalist team and critical care was consulted for evaluation.   Patient seen and examined on arrival to the ICU. She is alert and oriented x3, in no distress on BIPAP with stable vital signs. Patient states she feels a lot better on the BIPAP although it is uncomfortable. She does complain of some generalized aches pains, but states she has chronic pain at baseline. She denies any chest pain, nausea or vomiting.   Chart reviewed. Significant lab values include: CO2 20, Glucose 192, Lactate 4.8-->1.1, Cr 1.84, Anion Gap 19, WBC 23.4  Pertinent  Medical History  COPD Depression  Anxiety  Micro Data:  5/24: Respiratory Viral Panel>>negative 5/24: Strep Pneumo urinary antigen>> POSITIVE 5/24: Legionella urinary antigen>>negative 5/24: Blood x2>> NGTD 5/24: MRSA PCR>> positive 5/24: Sputum>> gram + cocci, rare budding yeast  Antimicrobials:  Azithromycin 5/24 x1 dose Cefepime 5/24 x1 dose Ceftriaxone 5/25>>  Significant Hospital Events: Including procedures, antibiotic start and stop dates in  addition to other pertinent events   5/24: Admitted by hospitalist team sepsis 2/2 pneumonia requiring BiPAP.  PCCM consulted.  Weaned off BiPAP, transferred to Med-Surg 5/25: Remains on BiPAP per pt preference.  Strep Pneumo urinary antigen +, ABX changed to Ceftriaxone.  Add BD + Pulmicort nebs ~ weaned off BiPAP and transferred to Med-Surg.  PCCM sign off. 5/26: Developed acute respiratory distress requiring BiPAP and transferred to ICU.  PCCM reconsulted.  High risk for intubation  Interim History / Subjective:  -This morning patient developed acute respiratory distress requiring BiPAP and transferred to ICU -Given 40 mg IV Lasix by Hospitalist -High risk for intubation -Noted to have severe inspiratory expiratory wheezing upon auscultation ~we will give continuous nebulizer, 80 mg Solu-Medrol and increase scheduled Solu-Medrol dose -As needed Dilaudid for air hunger  Objective   Blood pressure 134/73, pulse 61, temperature 98.6 F (37 C), resp. rate (!) 36, height 5\' 5"  (1.651 m), weight 57.4 kg, SpO2 99 %.    FiO2 (%):  [40 %-60 %] 60 %   Intake/Output Summary (Last 24 hours) at 09/25/2021 1215 Last data filed at 09/24/2021 1945 Gross per 24 hour  Intake 484.6 ml  Output 650 ml  Net -165.4 ml    Filed Weights   09/23/21 2025  Weight: 57.4 kg    Examination: General: Critically ill-appearing female, laying in bed, on BiPAP with moderate respiratory distress and accessory muscle use HENT: Pupils equal and reactive, sclera clear, normocephalic Lungs: Severe inspiratory and expiratory wheezing, BiPAP assisted, increased work of breathing accessory muscle use Cardiovascular: Heart sounds S1S2, no edema, warm and palpable pulses Abdomen: Soft, non-tender, bowel sounds active Extremities:  No deformities Neuro: Lethargic, arouses to gentle stimulation and follows commands, no focal deficits   Resolved Hospital Problem list   Elevated Lactate cleared  Assessment & Plan:    Acute hypoxemic respiratory failure, 2/2 Multifocal pneumonia & COPD Exacerbation -Supplemental O2 as needed to maintain O2 sats 88 to 92% -BiPAP, wean as tolerated -High risk for intubation -Follow intermittent Chest X-ray & ABG as needed -Bronchodilators & Pulmicort nebs -Give one-time dose of continuous albuterol neb -IV Steroids ~ will give one-time dose of 80 mg Solu-Medrol, and increase scheduled Solu-Medrol to 40 mg twice daily on 5/26 -As needed Dilaudid for air hunger -ABX as above -Pulmonary toilet as able -Given 40 mg IV Lasix by Hospitalist 5/26  Sepsis due to Multifocal Pneumonia (Strep Pneumoniae Pneumonia) -Monitor fever curve -Trend WBC's & Procalcitonin -Follow cultures as above -Strep Pneumo urinary antigen + -Continue ceftriaxone   Acute Kidney Injury, 2/2 to shock state from above ~RESOVED -Monitor I&O's / urinary output -Follow BMP -Ensure adequate renal perfusion -Avoid nephrotoxic agents as able -Replace electrolytes as indicated  Anxiety Depression -Home medications: Zoloft 150mg  daily continued  Patient is critically ill, high risk for intubation, cardiac arrest and death.  If patient is intubated, likely would be difficult to wean from ventilator due to severe COPD.  Best Practice (right click and "Reselect all SmartList Selections" daily)   Diet/type: NPO, regular diet when weaned off BiPAP DVT prophylaxis: LMWH GI prophylaxis: PPI Lines: N/A Foley:  N/A Code Status:  full code Last date of multidisciplinary goals of care discussion [N/A]  Patient's spouse updated at bedside 5/26.  We discussed high risk for further deterioration requiring intubation.  Patient's spouse confirmed she is full code and would like intubation if needed.  All questions answered to their satisfaction.  Labs   CBC: Recent Labs  Lab 09/23/21 1424 09/24/21 0451 09/25/21 0521  WBC 23.4* 18.0* 18.7*  NEUTROABS 20.2*  --   --   HGB 11.5* 9.9* 8.9*  HCT 38.9  33.8* 30.1*  MCV 80.9 82.0 82.5  PLT 191 149* 146*     Basic Metabolic Panel: Recent Labs  Lab 09/23/21 1424 09/24/21 0451 09/25/21 0521  NA 138 142 140  K 4.3 4.1 3.8  CL 99 108 110  CO2 20* 25 25  GLUCOSE 192* 127* 133*  BUN 36* 32* 41*  CREATININE 1.84* 1.05* 0.75  CALCIUM 9.7 8.5* 8.7*  MG  --  1.8 2.1  PHOS  --  4.8*  --     GFR: Estimated Creatinine Clearance: 66.5 mL/min (by C-G formula based on SCr of 0.75 mg/dL). Recent Labs  Lab 09/23/21 1424 09/23/21 1724 09/23/21 2117 09/23/21 2328 09/24/21 0451 09/25/21 0521  PROCALCITON  --   --   --   --  10.68 6.17  WBC 23.4*  --   --   --  18.0* 18.7*  LATICACIDVEN 4.8* 1.8 2.0* 1.1  --   --      Liver Function Tests: Recent Labs  Lab 09/23/21 1424  AST 33  ALT 14  ALKPHOS 85  BILITOT 0.5  PROT 8.1  ALBUMIN 3.8    No results for input(s): LIPASE, AMYLASE in the last 168 hours. No results for input(s): AMMONIA in the last 168 hours.  ABG    Component Value Date/Time   PHART 7.31 (L) 09/24/2021 0119   PCO2ART 51 (H) 09/24/2021 0119   PO2ART 67 (L) 09/24/2021 0119   HCO3 25.7 09/24/2021 0119   ACIDBASEDEF 1.2 09/24/2021 0119  O2SAT 93.1 09/24/2021 0119      Coagulation Profile: No results for input(s): INR, PROTIME in the last 168 hours.  Cardiac Enzymes: No results for input(s): CKTOTAL, CKMB, CKMBINDEX, TROPONINI in the last 168 hours.  HbA1C: Hemoglobin A1C  Date/Time Value Ref Range Status  03/13/2014 04:26 AM 6.7 (H) 4.2 - 6.3 % Final    Comment:    The American Diabetes Association recommends that a primary goal of therapy should be <7% and that physicians should reevaluate the treatment regimen in patients with HbA1c values consistently >8%.     CBG: Recent Labs  Lab 09/23/21 2014 09/25/21 1129  GLUCAP 134* 206*     Review of Systems:   Unable to assess due to acute respiratory distress and BiPAP  Past Medical History:  She,  has a past medical history of Anemia,  Anxiety, Asthma, COPD (chronic obstructive pulmonary disease) (HCC), Depression, and Ventral hernia.   Surgical History:   Past Surgical History:  Procedure Laterality Date   COLON SURGERY     EAR TUBE REMOVAL     FRACTURE SURGERY     HERNIA REPAIR     TUBAL LIGATION       Social History:   reports that she quit smoking about 17 months ago. Her smoking use included cigarettes. She smoked an average of .5 packs per day. She has never used smokeless tobacco. She reports that she does not drink alcohol and does not use drugs.   Family History:  Her family history includes Alcohol abuse in her father; Sarcoidosis in her mother.   Allergies Allergies  Allergen Reactions   Banana Nausea And Vomiting     Home Medications  Prior to Admission medications   Medication Sig Start Date End Date Taking? Authorizing Provider  acetaminophen (TYLENOL) 500 MG tablet Take 1,000 mg by mouth every 6 (six) hours as needed.    [provider]  albuterol (PROVENTIL HFA;VENTOLIN HFA) 108 (90 BASE) MCG/ACT inhaler Inhale 2 puffs into the lungs every 6 (six) hours as needed for wheezing or shortness of breath. 04/09/15   Adrian Saran, MD  albuterol (PROVENTIL) (2.5 MG/3ML) 0.083% nebulizer solution Take 2.5 mg by nebulization every 6 (six) hours as needed for wheezing or shortness of breath.    [provider]  ALPRAZolam Prudy Feeler) 0.5 MG tablet Take 0.5-1 mg by mouth 2 (two) times daily as needed for anxiety. 12/17/20 12/17/21  [provider]  dextromethorphan-guaiFENesin (MUCINEX DM) 30-600 MG 12hr tablet Take 1 tablet by mouth 2 (two) times daily. 03/20/21   Rolly Salter, MD  docusate sodium (COLACE) 100 MG capsule Take 100 mg by mouth daily as needed for mild constipation.    [provider]  gabapentin (NEURONTIN) 300 MG capsule Take 300 mg by mouth 3 (three) times daily. 12/17/20   [provider]  guaiFENesin (ROBITUSSIN) 100 MG/5ML liquid Take 5 mLs by mouth  every 4 (four) hours as needed for cough or to loosen phlegm. 07/21/21   Delfino Lovett, MD  magnesium oxide (MAG-OX) 400 MG tablet Take 400 mg by mouth daily.    [provider]  pantoprazole (PROTONIX) 40 MG tablet Take 40 mg by mouth daily.    [provider]  predniSONE (STERAPRED UNI-PAK 21 TAB) 10 MG (21) TBPK tablet Start 60 mg po daily, taper 10 mg daily until finish 07/21/21   Delfino Lovett, MD  sertraline (ZOLOFT) 100 MG tablet Take 150 mg by mouth daily.    [provider]  TRELEGY ELLIPTA 100-62.5-25 MCG/ACT AEPB Inhale 1 puff into the lungs daily. 07/10/21   [provider]     Critical care time: 45 minutes    Harlon Ditty, AGACNP-BC [facility] Prefer epic messenger for cross cover needs If after hours, please call E-link

## 2021-09-25 NOTE — Progress Notes (Signed)
Requested patient be assigned to ICU, talked to [facility] in patient placement.

## 2021-09-25 NOTE — Progress Notes (Signed)
Pt woke up and started pulling on bipap, was able to take bipap off and is fighting to keep it off, very anxious and restless, also tachypneic and dyspneic, husband at bedside helping to calm pt down, pt c/o of generalized pain, MD at bedside orders low dose dilaudid, pt given dilaudid, pt calms down, has bipap mask on, no tachypnea or dyspnea anymore. Pt resting with o2 sats now 99%.

## 2021-09-26 ENCOUNTER — Inpatient Hospital Stay: Payer: 59

## 2021-09-26 DIAGNOSIS — J189 Pneumonia, unspecified organism: Secondary | ICD-10-CM | POA: Diagnosis not present

## 2021-09-26 DIAGNOSIS — A419 Sepsis, unspecified organism: Secondary | ICD-10-CM | POA: Diagnosis not present

## 2021-09-26 LAB — BASIC METABOLIC PANEL
Anion gap: 8 (ref 5–15)
BUN: 44 mg/dL — ABNORMAL HIGH (ref 8–23)
CO2: 32 mmol/L (ref 22–32)
Calcium: 9 mg/dL (ref 8.9–10.3)
Chloride: 105 mmol/L (ref 98–111)
Creatinine, Ser: 0.57 mg/dL (ref 0.44–1.00)
GFR, Estimated: >60 mL/min (ref >60)
Glucose, Bld: 146 mg/dL — ABNORMAL HIGH (ref 70–99)
Potassium: 2.8 mmol/L — ABNORMAL LOW (ref 3.5–5.1)
Sodium: 145 mmol/L (ref 135–145)

## 2021-09-26 LAB — CBC
HCT: 26.8 % — ABNORMAL LOW (ref 36.0–46.0)
Hemoglobin: 8 g/dL — ABNORMAL LOW (ref 12.0–15.0)
MCH: 23.7 pg — ABNORMAL LOW (ref 26.0–34.0)
MCHC: 29.9 g/dL — ABNORMAL LOW (ref 30.0–36.0)
MCV: 79.3 fL — ABNORMAL LOW (ref 80.0–100.0)
Platelets: 131 10*3/uL — ABNORMAL LOW (ref 150–400)
RBC: 3.38 MIL/uL — ABNORMAL LOW (ref 3.87–5.11)
RDW: 17 % — ABNORMAL HIGH (ref 11.5–15.5)
WBC: 5.9 10*3/uL (ref 4.0–10.5)
nRBC: 0 % (ref 0.0–0.2)

## 2021-09-26 LAB — MAGNESIUM: Magnesium: 2.3 mg/dL (ref 1.7–2.4)

## 2021-09-26 LAB — PHOSPHORUS: Phosphorus: 2.4 mg/dL — ABNORMAL LOW (ref 2.5–4.6)

## 2021-09-26 LAB — POTASSIUM: Potassium: 3.6 mmol/L (ref 3.5–5.1)

## 2021-09-26 MED ORDER — MELATONIN 5 MG PO TABS
5.0000 mg | ORAL_TABLET | Freq: Once | ORAL | Status: AC
  Administered 2021-09-26: 5 mg via ORAL
  Filled 2021-09-26: qty 1

## 2021-09-26 MED ORDER — VANCOMYCIN HCL 1250 MG/250ML IV SOLN
1250.0000 mg | INTRAVENOUS | Status: DC
  Filled 2021-09-26: qty 250

## 2021-09-26 MED ORDER — POTASSIUM CHLORIDE CRYS ER 20 MEQ PO TBCR
40.0000 meq | EXTENDED_RELEASE_TABLET | Freq: Once | ORAL | Status: AC
  Administered 2021-09-26: 40 meq via ORAL
  Filled 2021-09-26: qty 2

## 2021-09-26 MED ORDER — SODIUM CHLORIDE 0.9 % IV SOLN: INTRAVENOUS | Status: DC | PRN

## 2021-09-26 MED ORDER — ORAL CARE MOUTH RINSE
15.0000 mL | Freq: Two times a day (BID) | OROMUCOSAL | Status: DC
  Administered 2021-09-26 – 2021-10-03 (×13): 15 mL via OROMUCOSAL

## 2021-09-26 MED ORDER — LINEZOLID 600 MG/300ML IV SOLN: 600.0000 mg | Freq: Two times a day (BID) | INTRAVENOUS | Status: DC

## 2021-09-26 MED ORDER — POTASSIUM CHLORIDE 10 MEQ/100ML IV SOLN
10.0000 meq | INTRAVENOUS | Status: DC
  Filled 2021-09-26 (×4): qty 100

## 2021-09-26 MED ORDER — LINEZOLID 600 MG/300ML IV SOLN
600.0000 mg | Freq: Two times a day (BID) | INTRAVENOUS | Status: DC
  Administered 2021-09-26 – 2021-09-27 (×2): 600 mg via INTRAVENOUS
  Filled 2021-09-26 (×2): qty 300

## 2021-09-26 MED ORDER — VANCOMYCIN HCL 1250 MG/250ML IV SOLN: 1250.0000 mg | INTRAVENOUS | Status: DC

## 2021-09-26 MED ORDER — MELATONIN 5 MG PO TABS: 5.0000 mg | ORAL_TABLET | Freq: Once | ORAL | Status: DC

## 2021-09-26 MED ORDER — ALPRAZOLAM 0.5 MG PO TABS
0.5000 mg | ORAL_TABLET | Freq: Two times a day (BID) | ORAL | Status: DC | PRN
  Administered 2021-09-26 – 2021-10-04 (×16): 0.5 mg via ORAL
  Filled 2021-09-26 (×16): qty 1

## 2021-09-26 MED ORDER — FUROSEMIDE 10 MG/ML IJ SOLN
40.0000 mg | Freq: Once | INTRAMUSCULAR | Status: AC
  Administered 2021-09-26: 40 mg via INTRAVENOUS
  Filled 2021-09-26: qty 4

## 2021-09-26 NOTE — Progress Notes (Signed)
NAME:  Julia [name], MRN:  [id number], DOB:  [birth year], LOS: 3 ADMISSION DATE:  09/23/2021, CONSULTATION DATE:  09/23/21 REFERRING MD:  Hospitalist, CHIEF COMPLAINT:  COPD exacerbation   History of Present Illness:   61 y.o. female with a known history of COPD, depression, anxiety presents to the emergency department for evaluation of chest pain, SOB.  Patient was in a usual state of health until 2-3 days ago when she has developed worsening chest discomfort, worse with inspiration, refractory to home medications including inhalers and pain meds. CTA chest was done in the ED which showed no PE but revealed new bilateral patchy consolidations, concerning for multifocal pneumonia. In addition, lab worked revealed and a lactate of 4.8 and AKI (Cr 1.84, no prior CKD) concerning for sepsis. Patient received azithromycin, Cefepime and was placed on BIPAP. The patient was admitted as stepdown by the hospitalist team and critical care was consulted for evaluation.   Patient seen and examined on arrival to the ICU. She is alert and oriented x3, in no distress on BIPAP with stable vital signs. Patient states she feels a lot better on the BIPAP although it is uncomfortable. She does complain of some generalized aches pains, but states she has chronic pain at baseline. She denies any chest pain, nausea or vomiting.   Chart reviewed. Significant lab values include: CO2 20, Glucose 192, Lactate 4.8-->1.1, Cr 1.84, Anion Gap 19, WBC 23.4  Pertinent  Medical History  COPD Depression  Anxiety  Micro Data:  5/24: Respiratory Viral Panel>>negative 5/24: Strep Pneumo urinary antigen>> POSITIVE 5/24: Legionella urinary antigen>>negative 5/24: Blood x2>> NGTD 5/24: MRSA PCR>> positive 5/24: Sputum>> gram + cocci, rare budding yeast  Antimicrobials:  Azithromycin 5/24 x1 dose Cefepime 5/24 x1 dose Ceftriaxone 5/25>>  Significant Hospital Events: Including procedures, antibiotic start and stop dates in  addition to other pertinent events   5/24: Admitted by hospitalist team sepsis 2/2 pneumonia requiring BiPAP.  PCCM consulted.  Weaned off BiPAP, transferred to Med-Surg 5/25: Remains on BiPAP per pt preference.  Strep Pneumo urinary antigen +, ABX changed to Ceftriaxone.  Add BD + Pulmicort nebs ~ weaned off BiPAP and transferred to Med-Surg.  PCCM sign off. 5/26: Developed acute respiratory distress requiring BiPAP and transferred to ICU.  PCCM reconsulted.  High risk for intubation  Interim History / Subjective:  -This morning patient developed acute respiratory distress requiring BiPAP and transferred to ICU -Given 40 mg IV Lasix by Hospitalist -High risk for intubation -Noted to have severe inspiratory expiratory wheezing upon auscultation ~we will give continuous nebulizer, 80 mg Solu-Medrol and increase scheduled Solu-Medrol dose -As needed Dilaudid for air hunger  Objective   Blood pressure (!) 152/82, pulse 70, temperature 98.5 F (36.9 C), temperature source Oral, resp. rate 20, height  (1.651 m), weight 57.4 kg, SpO2 99 %.    FiO2 (%):  [35 %-60 %] 40 %   Intake/Output Summary (Last 24 hours) at 09/26/2021 0751 Last data filed at 09/25/2021 2300 Gross per 24 hour  Intake --  Output 2300 ml  Net -2300 ml    Filed Weights   09/23/21 2025  Weight: 57.4 kg    Examination: General: Critically ill-appearing female, laying in bed HEENT: Pupils equal and reactive, sclera clear, normocephalic Lungs: Scatterd inspiratory and expiratory wheezing, diminished breath sounds Cardiovascular: Heart sounds S1S2, no edema, warm and palpable pulses Abdomen: Soft, non-tender, bowel sounds active Extremities: No deformities Neuro: More alert this morning  Resolved Hospital Problem list  Elevated Lactate cleared  Assessment & Plan:   Acute hypoxemic respiratory failure, 2/2 Multifocal pneumonia & COPD Exacerbation -Supplemental O2 as needed to maintain O2 sats 88 to 92% -BiPAP,  wean as tolerated -High risk for intubation, but much improved today -Follow intermittent Chest X-ray & ABG as needed -Bronchodilators & Pulmicort nebs -IV Steroids ~ Solu-Medrol to 40 mg twice daily  -As needed Dilaudid for air hunger -ABX as above -Pulmonary toilet as able -Given 40 mg IV Lasix by Hospitalist 5/26  Sepsis due to Multifocal Pneumonia (Strep Pneumoniae Pneumonia) -Monitor fever curve -Trend WBC's & Procalcitonin -Follow cultures as above -Strep Pneumo urinary antigen + -Continue ceftriaxone   Acute Kidney Injury, 2/2 to shock state from above ~RESOVED -Monitor I&O's / urinary output -Follow BMP -Ensure adequate renal perfusion -Avoid nephrotoxic agents as able -Replace electrolytes as indicated  Anxiety Depression -Home medications: Zoloft 150mg  daily continued  Patient is critically ill, high risk for intubation, cardiac arrest and death.  If patient is still at risk for intubation, but improved from events of yesterday, likely would be difficult to wean from ventilator due to severe COPD.  Best Practice (right click and "Reselect all SmartList Selections" daily)   Diet/type: NPO, regular diet when weaned off BiPAP DVT prophylaxis: LMWH GI prophylaxis: PPI Lines: N/A Foley:  N/A Code Status:  full code Last date of multidisciplinary goals of care discussion [N/A]  Patient's spouse updated at bedside 5/26.  We discussed high risk for further deterioration requiring intubation.  Patient's spouse confirmed she is full code and would like intubation if needed.  All questions answered to their satisfaction.  Labs   CBC: Recent Labs  Lab 09/23/21 1424 09/24/21 0451 09/25/21 0521 09/26/21 0509  WBC 23.4* 18.0* 18.7* 5.9  NEUTROABS 20.2*  --   --   --   HGB 11.5* 9.9* 8.9* 8.0*  HCT 38.9 33.8* 30.1* 26.8*  MCV 80.9 82.0 82.5 79.3*  PLT 191 149* 146* 131*     Basic Metabolic Panel: Recent Labs  Lab 09/23/21 1424 09/24/21 0451 09/25/21 0521  09/26/21 0509  NA 138 142 140 145  K 4.3 4.1 3.8 2.8*  CL 99 108 110 105  CO2 20* 25 25 32  GLUCOSE 192* 127* 133* 146*  BUN 36* 32* 41* 44*  CREATININE 1.84* 1.05* 0.75 0.57  CALCIUM 9.7 8.5* 8.7* 9.0  MG  --  1.8 2.1 2.3  PHOS  --  4.8*  --  2.4*    GFR: Estimated Creatinine Clearance: 66.5 mL/min (by C-G formula based on SCr of 0.57 mg/dL). Recent Labs  Lab 09/23/21 1424 09/23/21 1724 09/23/21 2117 09/23/21 2328 09/24/21 0451 09/25/21 0521 09/26/21 0509  PROCALCITON  --   --   --   --  10.68 6.17  --   WBC 23.4*  --   --   --  18.0* 18.7* 5.9  LATICACIDVEN 4.8* 1.8 2.0* 1.1  --   --   --      Liver Function Tests: Recent Labs  Lab 09/23/21 1424  AST 33  ALT 14  ALKPHOS 85  BILITOT 0.5  PROT 8.1  ALBUMIN 3.8    No results for input(s): LIPASE, AMYLASE in the last 168 hours. No results for input(s): AMMONIA in the last 168 hours.  ABG    Component Value Date/Time   PHART 7.31 (L) 09/24/2021 0119   PCO2ART 51 (H) 09/24/2021 0119   PO2ART 67 (L) 09/24/2021 0119   HCO3 25.7 09/24/2021 0119  ACIDBASEDEF 1.2 09/24/2021 0119   O2SAT 93.1 09/24/2021 0119      Coagulation Profile: No results for input(s): INR, PROTIME in the last 168 hours.  Cardiac Enzymes: No results for input(s): CKTOTAL, CKMB, CKMBINDEX, TROPONINI in the last 168 hours.  HbA1C: Hemoglobin A1C  Date/Time Value Ref Range Status  03/13/2014 04:26 AM 6.7 (H) 4.2 - 6.3 % Final    Comment:    The American Diabetes Association recommends that a primary goal of therapy should be <7% and that physicians should reevaluate the treatment regimen in patients with HbA1c values consistently >8%.     CBG: Recent Labs  Lab 09/23/21 2014 09/25/21 1129  GLUCAP 134* 206*     Review of Systems:   Unable to assess due to acute respiratory distress and BiPAP  Past Medical History:  She,  has a past medical history of Anemia, Anxiety, Asthma, COPD (chronic obstructive pulmonary disease)  (HCC), Depression, and Ventral hernia.   Surgical History:   Past Surgical History:  Procedure Laterality Date   COLON SURGERY     EAR TUBE REMOVAL     FRACTURE SURGERY     HERNIA REPAIR     TUBAL LIGATION       Social History:   reports that she quit smoking about 17 months ago. Her smoking use included cigarettes. She smoked an average of .5 packs per day. She has never used smokeless tobacco. She reports that she does not drink alcohol and does not use drugs.   Family History:  Her family history includes Alcohol abuse in her father; Sarcoidosis in her mother.   Allergies Allergies  Allergen Reactions   Banana Nausea And Vomiting     Home Medications  Prior to Admission medications   Medication Sig Start Date End Date Taking? Authorizing Provider  acetaminophen (TYLENOL) 500 MG tablet Take 1,000 mg by mouth every 6 (six) hours as needed.    [provider]  albuterol (PROVENTIL HFA;VENTOLIN HFA) 108 (90 BASE) MCG/ACT inhaler Inhale 2 puffs into the lungs every 6 (six) hours as needed for wheezing or shortness of breath. 04/09/15   Adrian Saran, MD  albuterol (PROVENTIL) (2.5 MG/3ML) 0.083% nebulizer solution Take 2.5 mg by nebulization every 6 (six) hours as needed for wheezing or shortness of breath.    [provider]  ALPRAZolam Prudy Feeler) 0.5 MG tablet Take 0.5-1 mg by mouth 2 (two) times daily as needed for anxiety. 12/17/20 12/17/21  [provider]  dextromethorphan-guaiFENesin (MUCINEX DM) 30-600 MG 12hr tablet Take 1 tablet by mouth 2 (two) times daily. 03/20/21   Rolly Salter, MD  docusate sodium (COLACE) 100 MG capsule Take 100 mg by mouth daily as needed for mild constipation.    [provider]  gabapentin (NEURONTIN) 300 MG capsule Take 300 mg by mouth 3 (three) times daily. 12/17/20   [provider]  guaiFENesin (ROBITUSSIN) 100 MG/5ML liquid Take 5 mLs by mouth every 4 (four) hours as needed for cough or to loosen phlegm.  07/21/21   Delfino Lovett, MD  magnesium oxide (MAG-OX) 400 MG tablet Take 400 mg by mouth daily.    [provider]  pantoprazole (PROTONIX) 40 MG tablet Take 40 mg by mouth daily.    [provider]  predniSONE (STERAPRED UNI-PAK 21 TAB) 10 MG (21) TBPK tablet Start 60 mg po daily, taper 10 mg daily until finish 07/21/21   Delfino Lovett, MD  sertraline (ZOLOFT) 100 MG tablet Take 150 mg by mouth daily.  [provider]  TRELEGY ELLIPTA 100-62.5-25 MCG/ACT AEPB Inhale 1 puff into the lungs daily. 07/10/21   [provider]     Critical care time: 40 minutes

## 2021-09-26 NOTE — Plan of Care (Signed)
  Problem: Safety: Goal: Ability to remain free from injury will improve Outcome: Not Progressing   

## 2021-09-26 NOTE — Progress Notes (Signed)
PROGRESS NOTE    JERALDIN FESLER  QIH:[id number] DOB: 1960/07/16 DOA: 09/23/2021 PCP: Olena Leatherwood, FNP  IC01A/[id number]   Assessment & Plan:   Principal Problem:   Sepsis due to pneumonia (HCC)   Sumie Twiford is a 61 y.o. female with a known history of COPD, depression, anxiety presents to the emergency department for evaluation of chest pain, SOB.  Patient was in a usual state of health until 2-3 days ago when she has developed worsening chest discomfort, worse with inspiration, refractory to home medications including inhalers and pain meds.    Severe Sepsis secondary to multifocal pneumonia --tachycardia, tachypnea, leukocytosis, AKI --tx with abx  Multifocal PNA 2/2 strep pneumo --strep pneumo antigen pos --cont ceftriaxone  COPD exacerbation --cont IV solumedrol 40 mg q12h --cont DuoNeb and pulmicort neb  Acute on chronic hypoxemic respiratory failure --pt uses 2.5L O2 nightly at baseline --On presentation, needed BiPAP, weaned down to 6L O2 next day, however back on BiPAP again morning of 5/26, likely due to fluid overload. --Continue supplemental O2 to keep sats between 88-92%, wean as tolerated  Fluid overload --from IVF.  --IV lasix 40 mg x1 today.  #. AKI --Cr 1.84 in presentation.  Improved to 1.05 with IVF, then improved even more with IV lasix to 0.57 --oral hydration  DVT prophylaxis: Lovenox SQ Code Status: Full code  Family Communication: husband updated at bedside today Level of care: Stepdown Dispo:   The patient is from: home Anticipated d/c is to: home Anticipated d/c date is: 2-3 days Patient currently is not medically ready to d/c due to: IV abx for PNA, on 6L   Subjective and Interval History:  Pt's breathing much improved today, and felt better.  O2 requirement down to 6L.  Had good urine output with IV lasix.  Eating well.   Objective: Vitals:   09/26/21 0200 09/26/21 0300 09/26/21 0400 09/26/21 1408  BP: 135/80 (!) 142/79  (!) 152/82 (!) 148/88  Pulse: 87 79 70 80  Resp: (!) 28 (!) 22 20 20   Temp:    98.6 F (37 C)  TempSrc:    Oral  SpO2: 99% 97% 99% 95%  Weight:      Height:        Intake/Output Summary (Last 24 hours) at 09/26/2021 1745 Last data filed at 09/26/2021 1400 Gross per 24 hour  Intake 480 ml  Output 1350 ml  Net -870 ml   Filed Weights   09/23/21 2025  Weight: 57.4 kg    Examination:   Constitutional: NAD, AAOx3, eating HEENT: conjunctivae and lids normal, EOMI CV: No cyanosis.   RESP: normal respiratory effort, on 6L Extremities: No effusions, edema in BLE SKIN: warm, dry Neuro: II - XII grossly intact.   Psych: Normal mood and affect.  Appropriate judgement and reason   Data Reviewed: I have personally reviewed following labs and imaging studies  CBC: Recent Labs  Lab 09/23/21 1424 09/24/21 0451 09/25/21 0521 09/26/21 0509  WBC 23.4* 18.0* 18.7* 5.9  NEUTROABS 20.2*  --   --   --   HGB 11.5* 9.9* 8.9* 8.0*  HCT 38.9 33.8* 30.1* 26.8*  MCV 80.9 82.0 82.5 79.3*  PLT 191 149* 146* 131*   Basic Metabolic Panel: Recent Labs  Lab 09/23/21 1424 09/24/21 0451 09/25/21 0521 09/26/21 0509  NA 138 142 140 145  K 4.3 4.1 3.8 2.8*  CL 99 108 110 105  CO2 20* 25 25 32  GLUCOSE 192* 127* 133* 146*  BUN  36* 32* 41* 44*  CREATININE 1.84* 1.05* 0.75 0.57  CALCIUM 9.7 8.5* 8.7* 9.0  MG  --  1.8 2.1 2.3  PHOS  --  4.8*  --  2.4*   GFR: Estimated Creatinine Clearance: 66.5 mL/min (by C-G formula based on SCr of 0.57 mg/dL). Liver Function Tests: Recent Labs  Lab 09/23/21 1424  AST 33  ALT 14  ALKPHOS 85  BILITOT 0.5  PROT 8.1  ALBUMIN 3.8   No results for input(s): LIPASE, AMYLASE in the last 168 hours. No results for input(s): AMMONIA in the last 168 hours. Coagulation Profile: No results for input(s): INR, PROTIME in the last 168 hours. Cardiac Enzymes: No results for input(s): CKTOTAL, CKMB, CKMBINDEX, TROPONINI in the last 168 hours. BNP (last 3  results) No results for input(s): PROBNP in the last 8760 hours. HbA1C: No results for input(s): HGBA1C in the last 72 hours. CBG: Recent Labs  Lab 09/23/21 2014 09/25/21 1129  GLUCAP 134* 206*   Lipid Profile: No results for input(s): CHOL, HDL, LDLCALC, TRIG, CHOLHDL, LDLDIRECT in the last 72 hours. Thyroid Function Tests: No results for input(s): TSH, T4TOTAL, FREET4, T3FREE, THYROIDAB in the last 72 hours. Anemia Panel: No results for input(s): VITAMINB12, FOLATE, FERRITIN, TIBC, IRON, RETICCTPCT in the last 72 hours. Sepsis Labs: Recent Labs  Lab 09/23/21 1424 09/23/21 1724 09/23/21 2117 09/23/21 2328 09/24/21 0451 09/25/21 0521  PROCALCITON  --   --   --   --  10.68 6.17  LATICACIDVEN 4.8* 1.8 2.0* 1.1  --   --     Recent Results (from the past 240 hour(s))  Blood Culture (routine x 2)     Status: None (Preliminary result)   Collection Time: 09/23/21  2:24 PM   Specimen: BLOOD  Result Value Ref Range Status   Specimen Description BLOOD RIGHT ANTECUBITAL  Final   Special Requests   Final    BOTTLES DRAWN AEROBIC AND ANAEROBIC Blood Culture results may not be optimal due to an inadequate volume of blood received in culture bottles   Culture   Final    NO GROWTH 3 DAYS Performed at [facility], [address] [place], [place] [address]    Report Status PENDING  Incomplete  MRSA Next Gen by PCR, Nasal     Status: Abnormal   Collection Time: 09/23/21  8:20 PM   Specimen: Nasal Mucosa; Nasal Swab  Result Value Ref Range Status   MRSA by PCR Next Gen DETECTED (A) NOT DETECTED Final    Comment: RESULT CALLED TO, READ BACK BY AND VERIFIED WITH: Paula ComptonKARLA [facility] 09/23/21 2207 MU (NOTE) The GeneXpert MRSA Assay (FDA approved for NASAL specimens only), is one component of a comprehensive MRSA colonization surveillance program. It is not intended to diagnose MRSA infection nor to guide or monitor treatment for MRSA infections. Test performance is not FDA  approved in patients less than 62 years old. Performed at [facility], [address] [place], [place] [address]   Culture, blood (Routine X 2) w Reflex to ID Panel     Status: None (Preliminary result)   Collection Time: 09/23/21  9:20 PM   Specimen: BLOOD  Result Value Ref Range Status   Specimen Description BLOOD RIGHT HAND  Final   Special Requests   Final    BOTTLES DRAWN AEROBIC AND ANAEROBIC Blood Culture adequate volume   Culture   Final    NO GROWTH 3 DAYS Performed at [facility], [address]  [place], [place] [address]    Report Status PENDING  Incomplete  Respiratory (~20 pathogens) panel by PCR     Status: None   Collection Time: 09/23/21  9:33 PM   Specimen: Nasopharyngeal Swab; Respiratory  Result Value Ref Range Status   Adenovirus NOT DETECTED NOT DETECTED Final   Coronavirus 229E NOT DETECTED NOT DETECTED Final    Comment: (NOTE) The Coronavirus on the Respiratory Panel, DOES NOT test for the novel  Coronavirus (2019 nCoV)    Coronavirus HKU1 NOT DETECTED NOT DETECTED Final   Coronavirus NL63 NOT DETECTED NOT DETECTED Final   Coronavirus OC43 NOT DETECTED NOT DETECTED Final   Metapneumovirus NOT DETECTED NOT DETECTED Final   Rhinovirus / Enterovirus NOT DETECTED NOT DETECTED Final   Influenza A NOT DETECTED NOT DETECTED Final   Influenza B NOT DETECTED NOT DETECTED Final   Parainfluenza Virus 1 NOT DETECTED NOT DETECTED Final   Parainfluenza Virus 2 NOT DETECTED NOT DETECTED Final   Parainfluenza Virus 3 NOT DETECTED NOT DETECTED Final   Parainfluenza Virus 4 NOT DETECTED NOT DETECTED Final   Respiratory Syncytial Virus NOT DETECTED NOT DETECTED Final   Bordetella pertussis NOT DETECTED NOT DETECTED Final   Bordetella Parapertussis NOT DETECTED NOT DETECTED Final   Chlamydophila pneumoniae NOT DETECTED NOT DETECTED Final   Mycoplasma pneumoniae NOT DETECTED NOT DETECTED Final    Comment: Performed at [facility], [address] [place], [place] [address]  SARS Coronavirus 2 by RT PCR (hospital order, performed in [facility] hospital lab) *cepheid single result test* Anterior Nasal Swab     Status: None   Collection Time: 09/24/21 11:48 AM   Specimen: Anterior Nasal Swab  Result Value Ref Range Status   SARS Coronavirus 2 by RT PCR NEGATIVE NEGATIVE Final    Comment: (NOTE) SARS-CoV-2 target nucleic acids are NOT DETECTED.  The SARS-CoV-2 RNA is generally detectable in upper and lower respiratory specimens during the acute phase of infection. The lowest concentration of SARS-CoV-2 viral copies this assay can detect is 250 copies / mL. A negative result does not preclude SARS-CoV-2 infection and should not be used as the sole basis for treatment or other patient management decisions.  A negative result may occur with improper specimen collection / handling, submission of specimen other than nasopharyngeal swab, presence of viral mutation(s) within the areas targeted by this assay, and inadequate number of viral copies (<250 copies / mL). A negative result must be combined with clinical observations, patient history, and epidemiological information.  Fact Sheet for Patients:   RoadLapTop.co.za  Fact Sheet for Healthcare Providers: http://kim-miller.com/  This test is not yet approved or  cleared by the [place] FDA and has been authorized for detection and/or diagnosis of SARS-CoV-2 by FDA under an Emergency Use Authorization (EUA).  This EUA will remain in effect (meaning this test can be used) for the duration of the COVID-19 declaration under Section 564(b)(1) of the Act, 21 U.S.C. section 360bbb-3(b)(1), unless the authorization is terminated or revoked sooner.  Performed at [facility], [address] [place], [place] [address]   Expectorated Sputum Assessment w Gram Stain, Rflx to Resp Cult     Status: None   Collection Time:  09/24/21 12:28 PM   Specimen: Expectorated Sputum  Result Value Ref Range Status   Specimen Description EXPECTORATED SPUTUM  Final   Special Requests NONE  Final   Sputum evaluation   Final    THIS SPECIMEN IS ACCEPTABLE FOR SPUTUM  CULTURE Performed at [facility], [address] [place], [place] [address]    Report Status 09/24/2021 FINAL  Final  Culture, Respiratory w Gram Stain     Status: None (Preliminary result)   Collection Time: 09/24/21 12:28 PM  Result Value Ref Range Status   Specimen Description   Final    EXPECTORATED SPUTUM Performed at [facility], [address] [place], [place] [address]    Special Requests   Final    NONE Reflexed from [id number] Performed at [facility], [address] [place], [place] [address]    Gram Stain   Final    ABUNDANT WBC PRESENT, PREDOMINANTLY PMN RARE GRAM POSITIVE COCCI RARE BUDDING YEAST SEEN Performed at [facility], [address] [place], [place] [address]    Culture FEW CANDIDA GLABRATA  Final   Report Status PENDING  Incomplete      Radiology Studies: DG Chest Port 1 View  Result Date: 09/25/2021 CLINICAL DATA:  Hypoxia, chest pain EXAM: PORTABLE CHEST 1 VIEW COMPARISON:  Prior chest x-ray 09/24/2021 FINDINGS: Significant interval progression of diffuse bilateral interstitial and airspace opacities with areas of greater confluence in the right lower lobe, right mid lung and periphery of the left lung. This is superimposed on a background of advanced bronchitic changes and interstitial prominence. No pneumothorax. No large effusion. Stable cardiac size. Atherosclerotic calcifications are present. IMPRESSION: Progressive bilateral interstitial and airspace opacities compared to 09/23/2021 concerning for worsening multifocal pneumonia. Electronically Signed   By: Malachy Moan M.D.   On: 09/25/2021 15:11     Scheduled Meds:  budesonide (PULMICORT) nebulizer solution  0.5 mg  Nebulization BID   Chlorhexidine Gluconate Cloth  6 each Topical Q0600   dextromethorphan-guaiFENesin  1 tablet Oral BID   enoxaparin (LOVENOX) injection  40 mg Subcutaneous Q24H   gabapentin  300 mg Oral TID   ipratropium-albuterol  3 mL Nebulization Q6H   magnesium oxide  400 mg Oral Daily   methylPREDNISolone (SOLU-MEDROL) injection  40 mg Intravenous Q12H   mupirocin ointment  1 application. Nasal BID   pantoprazole  40 mg Oral Daily   sertraline  150 mg Oral Daily   Continuous Infusions:  albuterol 15 mg/hr (09/25/21 1247)   albuterol 10 mg/hr (09/25/21 1417)   cefTRIAXone (ROCEPHIN)  IV 2 g (09/26/21 3086)     LOS: 3 days     Darlin Priestly, MD [facility] If 7PM-7AM, please contact night-coverage 09/26/2021, 5:45 PM

## 2021-09-26 NOTE — Progress Notes (Signed)
Received a call from bedside RN regarding the patient needing something for sleep.  Ordered Melatonin 5 mg x 1 dose.  Reviewed her chart and imaging, CTA chest from 09/23/21 and CXR from 09/25/21.  She appears to have worsening infiltrates on CXR.  Currently on 6L HFNC.  Per PCCM, the patient is stable.  Not on O2 supplementation at baseline.    MRSA pcr positive 09/23/21.  Repeated sputum culture and added IV Linezolid for MRSA coverage due to severe multifocal pneumonia.  Can deescalate antibiotics with clinical improvement and further negative culture for MRSA.  On Rocephin for Strep pneumoniae PNA.  Per bedside RN, the patient has a wet productive cough.    Also ordered ABG to reassess response to BIPAP.  Last ABG was done on 09/24/21  PH 7.31/51/67 on 40% FiO2 10/5.  We will continue to closely monitor and treat as indicated.

## 2021-09-26 NOTE — Progress Notes (Signed)
Concern for aspiration Patient is consistently coughing and appears to be having difficulty swallowing thin liquids particularly.  - Due to tenuous respiratory status, will keep pt NPO- sips with meds.  - SLP eval ordered.   Cheryll Cockayne Rust-Chester, AGACNP-BC Acute Care Nurse Practitioner [facility]   [phone] / [phone] Please see Amion for pager details.

## 2021-09-27 DIAGNOSIS — J189 Pneumonia, unspecified organism: Secondary | ICD-10-CM | POA: Diagnosis not present

## 2021-09-27 DIAGNOSIS — A419 Sepsis, unspecified organism: Secondary | ICD-10-CM | POA: Diagnosis not present

## 2021-09-27 LAB — CBC
HCT: 27.6 % — ABNORMAL LOW (ref 36.0–46.0)
Hemoglobin: 8.3 g/dL — ABNORMAL LOW (ref 12.0–15.0)
MCH: 23.8 pg — ABNORMAL LOW (ref 26.0–34.0)
MCHC: 30.1 g/dL (ref 30.0–36.0)
MCV: 79.1 fL — ABNORMAL LOW (ref 80.0–100.0)
Platelets: 131 10*3/uL — ABNORMAL LOW (ref 150–400)
RBC: 3.49 MIL/uL — ABNORMAL LOW (ref 3.87–5.11)
RDW: 17 % — ABNORMAL HIGH (ref 11.5–15.5)
WBC: 8.5 10*3/uL (ref 4.0–10.5)
nRBC: 0 % (ref 0.0–0.2)

## 2021-09-27 LAB — BASIC METABOLIC PANEL
Anion gap: 5 (ref 5–15)
BUN: 43 mg/dL — ABNORMAL HIGH (ref 8–23)
CO2: 33 mmol/L — ABNORMAL HIGH (ref 22–32)
Calcium: 8.6 mg/dL — ABNORMAL LOW (ref 8.9–10.3)
Chloride: 101 mmol/L (ref 98–111)
Creatinine, Ser: 0.63 mg/dL (ref 0.44–1.00)
GFR, Estimated: >60 mL/min (ref >60)
Glucose, Bld: 175 mg/dL — ABNORMAL HIGH (ref 70–99)
Potassium: 4.1 mmol/L (ref 3.5–5.1)
Sodium: 139 mmol/L (ref 135–145)

## 2021-09-27 LAB — PHOSPHORUS: Phosphorus: 2.3 mg/dL — ABNORMAL LOW (ref 2.5–4.6)

## 2021-09-27 LAB — MAGNESIUM: Magnesium: 2 mg/dL (ref 1.7–2.4)

## 2021-09-27 MED ORDER — MELATONIN 5 MG PO TABS
5.0000 mg | ORAL_TABLET | Freq: Every evening | ORAL | Status: DC | PRN
  Administered 2021-09-27 – 2021-10-04 (×7): 5 mg via ORAL
  Filled 2021-09-27 (×7): qty 1

## 2021-09-27 MED ORDER — ACETYLCYSTEINE 20 % IN SOLN: 4.0000 mL | Freq: Two times a day (BID) | RESPIRATORY_TRACT | Status: DC

## 2021-09-27 MED ORDER — FUROSEMIDE 10 MG/ML IJ SOLN
40.0000 mg | Freq: Once | INTRAMUSCULAR | Status: AC
  Administered 2021-09-27: 40 mg via INTRAVENOUS
  Filled 2021-09-27: qty 4

## 2021-09-27 MED ORDER — SODIUM PHOSPHATES 45 MMOLE/15ML IV SOLN
30.0000 mmol | Freq: Once | INTRAVENOUS | Status: AC
  Administered 2021-09-27: 30 mmol via INTRAVENOUS
  Filled 2021-09-27: qty 10

## 2021-09-27 MED ORDER — ACETYLCYSTEINE 20 % IN SOLN
4.0000 mL | Freq: Two times a day (BID) | RESPIRATORY_TRACT | Status: DC
  Administered 2021-09-27 – 2021-10-05 (×14): 4 mL via RESPIRATORY_TRACT
  Filled 2021-09-27 (×15): qty 4

## 2021-09-27 NOTE — Progress Notes (Signed)
PROGRESS NOTE    ANABELA CRAYTON  HYQ:[id number] DOB: 1960/09/12 DOA: 09/23/2021 PCP: Olena Leatherwood, FNP  124A/124A-AA   Assessment & Plan:   Principal Problem:   Sepsis due to pneumonia (HCC)   Dominik Garton is a 61 y.o. female with a known history of COPD, depression, anxiety presents to the emergency department for evaluation of chest pain, SOB.  Patient was in a usual state of health until 2-3 days ago when she has developed worsening chest discomfort, worse with inspiration, refractory to home medications including inhalers and pain meds.   Multifocal PNA 2/2 strep pneumo --strep pneumo antigen pos --respiratory status worsened suddenly on 5/26 and repeat CXR on 5/26 read as "Progressive bilateral interstitial and airspace opacities compared to 09/23/2021 concerning for worsening multifocal pneumonia", however, CXR reviewed by myself, and I believe increased opacities were also compatible with pulm edema, which was more consistent with pt's clinical presentation (received sepsis IVF on presentation, acute worsening needing BiPAP while already on appropriate abx, and quick respiratory improvement after IV lasix).   Plan: --cont ceftriaxone --No need for MRSA coverage at this point.  Pulm edema from Fluid overload --from sepsis IVF.   Improved with IV lasix. --cont IV lasix 40 mg x1 today  COPD exacerbation --cont IV solumedrol 40 mg q12h --cont DuoNeb and pulmicort neb --add Mucomyst neb  Acute on chronic hypoxemic respiratory failure --pt uses 2.5L O2 nightly at baseline --On presentation, needed BiPAP, weaned down to 6L O2 next day, however back on BiPAP again morning of 5/26, likely due to fluid overload from sepsis IVF.  Pt improved quickly after IV lasix, and O2 requirement back to 6L on 5/27. --Continue supplemental O2 to keep sats between 88-92%, wean as tolerated  Severe Sepsis secondary to multifocal pneumonia --tachycardia, tachypnea, leukocytosis, AKI --tx  with abx  # AKI --Cr 1.84 in presentation.  Improved to 1.05 with IVF, then improved even more with IV lasix to 0.57 --oral hydration   DVT prophylaxis: Lovenox SQ Code Status: Full code  Family Communication:  Level of care: Med-Surg Dispo:   The patient is from: home Anticipated d/c is to: home Anticipated d/c date is: 2-3 days Patient currently is not medically ready to d/c due to: IV abx for PNA, on 5L   Subjective and Interval History:  Pt reported doing better.  No issue with swallowing.  O2 requirement remained stable at 5L for the past 2 days, so transferred back to the floor.   Objective: Vitals:   09/27/21 1300 09/27/21 1400 09/27/21 1418 09/27/21 1607  BP:  (!) 132/94  131/86  Pulse: 72 72  70  Resp: (!) Temp:    98.5 F (36.9 C)  TempSrc:    Oral  SpO2: 98% 100% 97% 95%  Weight:      Height:        Intake/Output Summary (Last 24 hours) at 09/27/2021 1653 Last data filed at 09/27/2021 1600 Gross per 24 hour  Intake 1786.12 ml  Output 2600 ml  Net -813.88 ml   Filed Weights   09/23/21 2025  Weight: 57.4 kg    Examination:   Constitutional: NAD, AAOx3 HEENT: conjunctivae and lids normal, EOMI CV: No cyanosis.   RESP: normal respiratory effort, rattling lung sounds, diffuse rhonchi, on 5L Extremities: No effusions, edema in BLE SKIN: warm, dry Neuro: II - XII grossly intact.   Psych: Normal mood and affect.  Appropriate judgement and reason   Data Reviewed: I  have personally reviewed following labs and imaging studies  CBC: Recent Labs  Lab 09/23/21 1424 09/24/21 0451 09/25/21 0521 09/26/21 0509 09/27/21 0408  WBC 23.4* 18.0* 18.7* 5.9 8.5  NEUTROABS 20.2*  --   --   --   --   HGB 11.5* 9.9* 8.9* 8.0* 8.3*  HCT 38.9 33.8* 30.1* 26.8* 27.6*  MCV 80.9 82.0 82.5 79.3* 79.1*  PLT 191 149* 146* 131* 131*   Basic Metabolic Panel: Recent Labs  Lab 09/23/21 1424 09/24/21 0451 09/25/21 0521 09/26/21 0509 09/26/21 2041  09/27/21 0408  NA 138 142 140 145  --  139  K 4.3 4.1 3.8 2.8* 3.6 4.1  CL 99 108 110 105  --  101  CO2 20* 25 25 32  --  33*  GLUCOSE 192* 127* 133* 146*  --  175*  BUN 36* 32* 41* 44*  --  43*  CREATININE 1.84* 1.05* 0.75 0.57  --  0.63  CALCIUM 9.7 8.5* 8.7* 9.0  --  8.6*  MG  --  1.8 2.1 2.3  --  2.0  PHOS  --  4.8*  --  2.4*  --  2.3*   GFR: Estimated Creatinine Clearance: 66.5 mL/min (by C-G formula based on SCr of 0.63 mg/dL). Liver Function Tests: Recent Labs  Lab 09/23/21 1424  AST 33  ALT 14  ALKPHOS 85  BILITOT 0.5  PROT 8.1  ALBUMIN 3.8   No results for input(s): LIPASE, AMYLASE in the last 168 hours. No results for input(s): AMMONIA in the last 168 hours. Coagulation Profile: No results for input(s): INR, PROTIME in the last 168 hours. Cardiac Enzymes: No results for input(s): CKTOTAL, CKMB, CKMBINDEX, TROPONINI in the last 168 hours. BNP (last 3 results) No results for input(s): PROBNP in the last 8760 hours. HbA1C: No results for input(s): HGBA1C in the last 72 hours. CBG: Recent Labs  Lab 09/23/21 2014 09/25/21 1129  GLUCAP 134* 206*   Lipid Profile: No results for input(s): CHOL, HDL, LDLCALC, TRIG, CHOLHDL, LDLDIRECT in the last 72 hours. Thyroid Function Tests: No results for input(s): TSH, T4TOTAL, FREET4, T3FREE, THYROIDAB in the last 72 hours. Anemia Panel: No results for input(s): VITAMINB12, FOLATE, FERRITIN, TIBC, IRON, RETICCTPCT in the last 72 hours. Sepsis Labs: Recent Labs  Lab 09/23/21 1424 09/23/21 1724 09/23/21 2117 09/23/21 2328 09/24/21 0451 09/25/21 0521  PROCALCITON  --   --   --   --  10.68 6.17  LATICACIDVEN 4.8* 1.8 2.0* 1.1  --   --     Recent Results (from the past 240 hour(s))  Blood Culture (routine x 2)     Status: None (Preliminary result)   Collection Time: 09/23/21  2:24 PM   Specimen: BLOOD  Result Value Ref Range Status   Specimen Description BLOOD RIGHT ANTECUBITAL  Final   Special Requests   Final     BOTTLES DRAWN AEROBIC AND ANAEROBIC Blood Culture results may not be optimal due to an inadequate volume of blood received in culture bottles   Culture   Final    NO GROWTH 4 DAYS Performed at [facility], [address] [place], [place] [address]    Report Status PENDING  Incomplete  MRSA Next Gen by PCR, Nasal     Status: Abnormal   Collection Time: 09/23/21  8:20 PM   Specimen: Nasal Mucosa; Nasal Swab  Result Value Ref Range Status   MRSA by PCR Next Gen DETECTED (A) NOT DETECTED Final    Comment:  RESULT CALLED TO, READ BACK BY AND VERIFIED WITH: Paula ComptonKARLA [facility] 09/23/21 2207 MU (NOTE) The GeneXpert MRSA Assay (FDA approved for NASAL specimens only), is one component of a comprehensive MRSA colonization surveillance program. It is not intended to diagnose MRSA infection nor to guide or monitor treatment for MRSA infections. Test performance is not FDA approved in patients less than 62 years old. Performed at [facility], [address] [place], [place] [address]   Culture, blood (Routine X 2) w Reflex to ID Panel     Status: None (Preliminary result)   Collection Time: 09/23/21  9:20 PM   Specimen: BLOOD  Result Value Ref Range Status   Specimen Description BLOOD RIGHT HAND  Final   Special Requests   Final    BOTTLES DRAWN AEROBIC AND ANAEROBIC Blood Culture adequate volume   Culture   Final    NO GROWTH 4 DAYS Performed at [facility], [address] [place], [place] [address]    Report Status PENDING  Incomplete  Respiratory (~20 pathogens) panel by PCR     Status: None   Collection Time: 09/23/21  9:33 PM   Specimen: Nasopharyngeal Swab; Respiratory  Result Value Ref Range Status   Adenovirus NOT DETECTED NOT DETECTED Final   Coronavirus 229E NOT DETECTED NOT DETECTED Final    Comment: (NOTE) The Coronavirus on the Respiratory Panel, DOES NOT test for the novel  Coronavirus (2019 nCoV)    Coronavirus HKU1 NOT DETECTED NOT  DETECTED Final   Coronavirus NL63 NOT DETECTED NOT DETECTED Final   Coronavirus OC43 NOT DETECTED NOT DETECTED Final   Metapneumovirus NOT DETECTED NOT DETECTED Final   Rhinovirus / Enterovirus NOT DETECTED NOT DETECTED Final   Influenza A NOT DETECTED NOT DETECTED Final   Influenza B NOT DETECTED NOT DETECTED Final   Parainfluenza Virus 1 NOT DETECTED NOT DETECTED Final   Parainfluenza Virus 2 NOT DETECTED NOT DETECTED Final   Parainfluenza Virus 3 NOT DETECTED NOT DETECTED Final   Parainfluenza Virus 4 NOT DETECTED NOT DETECTED Final   Respiratory Syncytial Virus NOT DETECTED NOT DETECTED Final   Bordetella pertussis NOT DETECTED NOT DETECTED Final   Bordetella Parapertussis NOT DETECTED NOT DETECTED Final   Chlamydophila pneumoniae NOT DETECTED NOT DETECTED Final   Mycoplasma pneumoniae NOT DETECTED NOT DETECTED Final    Comment: Performed at [facility], [address] [place], [place] [address]  SARS Coronavirus 2 by RT PCR (hospital order, performed in [facility] lab) *cepheid single result test* Anterior Nasal Swab     Status: None   Collection Time: 09/24/21 11:48 AM   Specimen: Anterior Nasal Swab  Result Value Ref Range Status   SARS Coronavirus 2 by RT PCR NEGATIVE NEGATIVE Final    Comment: (NOTE) SARS-CoV-2 target nucleic acids are NOT DETECTED.  The SARS-CoV-2 RNA is generally detectable in upper and lower respiratory specimens during the acute phase of infection. The lowest concentration of SARS-CoV-2 viral copies this assay can detect is 250 copies / mL. A negative result does not preclude SARS-CoV-2 infection and should not be used as the sole basis for treatment or other patient management decisions.  A negative result may occur with improper specimen collection / handling, submission of specimen other than nasopharyngeal swab, presence of viral mutation(s) within the areas targeted by this assay, and inadequate number of viral copies (<250 copies  / mL). A negative result must be combined with clinical observations, patient history, and epidemiological information.  Fact Sheet for Patients:   RoadLapTop.co.za  Fact Sheet for Healthcare Providers: http://kim-miller.com/  This test is not yet approved or  cleared by the [place] FDA and has been authorized for detection and/or diagnosis of SARS-CoV-2 by FDA under an Emergency Use Authorization (EUA).  This EUA will remain in effect (meaning this test can be used) for the duration of the COVID-19 declaration under Section 564(b)(1) of the Act, 21 U.S.C. section 360bbb-3(b)(1), unless the authorization is terminated or revoked sooner.  Performed at [facility], [address] [place], [place] [address]   Expectorated Sputum Assessment w Gram Stain, Rflx to Resp Cult     Status: None   Collection Time: 09/24/21 12:28 PM   Specimen: Expectorated Sputum  Result Value Ref Range Status   Specimen Description EXPECTORATED SPUTUM  Final   Special Requests NONE  Final   Sputum evaluation   Final    THIS SPECIMEN IS ACCEPTABLE FOR SPUTUM CULTURE Performed at [facility], [address], [place], [place] [address]    Report Status 09/24/2021 FINAL  Final  Culture, Respiratory w Gram Stain     Status: None (Preliminary result)   Collection Time: 09/24/21 12:28 PM  Result Value Ref Range Status   Specimen Description   Final    EXPECTORATED SPUTUM Performed at [facility], [address] [place], [place] [address]    Special Requests   Final    NONE Reflexed from [id number] Performed at [facility], [address] [place], [place] [address]    Gram Stain   Final    ABUNDANT WBC PRESENT, PREDOMINANTLY PMN RARE GRAM POSITIVE COCCI RARE BUDDING YEAST SEEN    Culture   Final    FEW CANDIDA GLABRATA RARE STAPHYLOCOCCUS AUREUS SUSCEPTIBILITIES TO FOLLOW Performed at [facility],  [address], [place], [place] [address]    Report Status PENDING  Incomplete      Radiology Studies: No results found.   Scheduled Meds:  budesonide (PULMICORT) nebulizer solution  0.5 mg Nebulization BID   Chlorhexidine Gluconate Cloth  6 each Topical Q0600   dextromethorphan-guaiFENesin  1 tablet Oral BID   enoxaparin (LOVENOX) injection  40 mg Subcutaneous Q24H   gabapentin  300 mg Oral TID   ipratropium-albuterol  3 mL Nebulization Q6H   magnesium oxide  400 mg Oral Daily   mouth rinse  15 mL Mouth Rinse BID   methylPREDNISolone (SOLU-MEDROL) injection  40 mg Intravenous Q12H   mupirocin ointment  1 application. Nasal BID   pantoprazole  40 mg Oral Daily   sertraline  150 mg Oral Daily   Continuous Infusions:  sodium chloride Stopped (09/27/21 0015)   cefTRIAXone (ROCEPHIN)  IV Stopped (09/27/21 1640)     LOS: 4 days     Darlin Priestly, MD [facility] If 7PM-7AM, please contact night-coverage 09/27/2021, 4:53 PM

## 2021-09-27 NOTE — Evaluation (Signed)
Clinical/Bedside Swallow Evaluation Patient Details  Name: Julia [name] MRN: [id number] Date of Birth: [birth year]  Today's Date: 09/27/2021 Time: SLP Start Time (ACUTE ONLY): 1028 SLP Stop Time (ACUTE ONLY): 1048 SLP Time Calculation (min) (ACUTE ONLY): 20 min  Past Medical History:  Past Medical History:  Diagnosis Date   Anemia    Anxiety    Asthma    COPD (chronic obstructive pulmonary disease) ([place])    Depression    Ventral hernia    Past Surgical History:  Past Surgical History:  Procedure Laterality Date   COLON SURGERY     EAR TUBE REMOVAL     FRACTURE SURGERY     HERNIA REPAIR     TUBAL LIGATION     HPI:  Per H&P "Julia [name] is a 61 y.o. female with a known history of COPD, depression, anxiety presents to the emergency department for evaluation of chest pain, SOB.  Patient was in a usual state of health until 2-3 days ago when she has developed worsening chest discomfort, worse with inspiration, refractory to home medications including inhalers and pain meds.      Patient denies fevers/chills, weakness, dizziness, N/V/C/D, abdominal pain, dysuria/frequency, changes in mental status.      Otherwise there has been no change in status. Patient has been taking medication as prescribed and there has been no recent change in medication or diet.  No recent antibiotics.  There has been no recent illness, hospitalizations, travel or sick contacts." CXR 09/25/21 "Progressive bilateral interstitial and airspace opacities compared  to 09/23/2021 concerning for worsening multifocal pneumonia."    Assessment / Plan / Recommendation  Clinical Impression  Pt seen for clinical swallowing evaluation. Pt alert, pleasant, and cooperative. On 5L/min O2 via HFNC. Pt denies difficulty swallowing. "I don't know why they think I'm aspirating. I swallow fine." Mildly hoarse vocal quality noted. Dry, non-productive cough appreciated prior to POs. Cleared with RN.  Oral motor examination  completed and largely unremarkable with exception of edentulism. Per pt, pt own dentures, but they are not present. Pt stated she does not always wear dentures for POs.   Pt given trials of thin liquids (via straw; single and consecutive sips), pureed, and solid. Pt also observed taking pills whole with water as given by RN. Pt presents with s/sx mild oral dysphagia c/b prolonged mastication of solid which is likely due to dental status. Pharyngeal swallow appeared [facility] per clinical assessment. To palpation, pt with seemingly timely swallow initiation and seemingly adequate laryngeal elevation. No overt or subtle s/sx pharyngeal dysphagia noted across trials.  No change to vocal quality or respiratory status observed.   Recommend initiation of a mech soft diet with safe swallowing strategies/aspiration precautions as outlined below.   SLP to f/u per POC for diet tolerance and trials of upgraded textures as appropriate.  Pt and RN made aware of results, recommendations, and SLP POC. Pt educated re: role of SLP, rationale for swallowing evaluation, relationship between breathing/swallowing, and clinical risk factors for aspiration/aspiration PNA. Pt verbalized understanding/agreement.   SLP Visit Diagnosis: Dysphagia, oral phase (R13.11)    Aspiration Risk  Mild aspiration risk    Diet Recommendation Dysphagia 3 (Mech soft);Thin liquid   Medication Administration: Whole meds with liquid Supervision: Patient able to self feed Compensations: Slow rate;Small sips/bites (rest breaks PRN for energy conservation) Postural Changes: Seated upright at 90 degrees;Remain upright for at least 30 minutes after po intake    Other  Recommendations Oral Care Recommendations: Oral care BID  Recommendations for follow up therapy are one component of a multi-disciplinary discharge planning process, led by the attending physician.  Recommendations may be updated based on patient status, additional functional  criteria and insurance authorization.  Follow up Recommendations No SLP follow up (likely no need for f/u SLP services)      Assistance Recommended at Discharge None (anticipated for swallowing)  Functional Status Assessment Patient has had a recent decline in their functional status and demonstrates the ability to make significant improvements in function in a reasonable and predictable amount of time.  Frequency and Duration min 2x/week  2 weeks       Prognosis Prognosis for Safe Diet Advancement: Good      Swallow Study   General Date of Onset: 09/23/21 HPI: Per H&P "Julia [name] is a 61 y.o. female with a known history of COPD, depression, anxiety presents to the emergency department for evaluation of chest pain, SOB.  Patient was in a usual state of health until 2-3 days ago when she has developed worsening chest discomfort, worse with inspiration, refractory to home medications including inhalers and pain meds.      Patient denies fevers/chills, weakness, dizziness, N/V/C/D, abdominal pain, dysuria/frequency, changes in mental status.      Otherwise there has been no change in status. Patient has been taking medication as prescribed and there has been no recent change in medication or diet.  No recent antibiotics.  There has been no recent illness, hospitalizations, travel or sick contacts." CXR 09/25/21 "Progressive bilateral interstitial and airspace opacities compared  to 09/23/2021 concerning for worsening multifocal pneumonia." Type of Study: Bedside Swallow Evaluation Previous Swallow Assessment: unknown Diet Prior to this Study: NPO Temperature Spikes Noted: No Respiratory Status: Nasal cannula (5L/min O2 via HFNC) History of Recent Intubation: No Behavior/Cognition: Alert;Cooperative;Pleasant mood Oral Cavity Assessment: Dry Oral Care Completed by SLP: Recent completion by staff Oral Cavity - Dentition: Dentures, not available;Dentures, top;Dentures,  bottom;Edentulous Vision: Functional for self-feeding Self-Feeding Abilities: Able to feed self Patient Positioning: Upright in bed Baseline Vocal Quality: Hoarse Volitional Cough: Strong Volitional Swallow: Able to elicit    Oral/Motor/Sensory Function Overall Oral Motor/Sensory Function: Within functional limits   Ice Chips Ice chips: Not tested   Thin Liquid Thin Liquid: Within functional limits Presentation: Self Fed;Straw Other Comments: ~6 oz via single and consecutive straw sips    Nectar Thick Nectar Thick Liquid: Not tested   Honey Thick Honey Thick Liquid: Not tested   Puree Puree: Within functional limits Presentation: Self Fed;Spoon Other Comments: x3 teaspoons; does not like applesauce   Solid     Solid: Impaired Oral Phase Impairments: Impaired mastication Oral Phase Functional Implications: Impaired mastication Pharyngeal Phase Impairments:  (appeared WFL)     Cherrie Gauze, M.S., [facility] [phone] (ASCOM)   Quintella Baton 09/27/2021,11:21 AM

## 2021-09-27 NOTE — Progress Notes (Signed)
NAME:  Julia [name], MRN:  [id number], DOB:  [birth year], LOS: 4 ADMISSION DATE:  09/23/2021, CONSULTATION DATE:  09/23/21 REFERRING MD:  Hospitalist, CHIEF COMPLAINT:  COPD exacerbation   History of Present Illness:   60 y.o. female with a known history of COPD, depression, anxiety presents to the emergency department for evaluation of chest pain, SOB.  Patient was in a usual state of health until 2-3 days ago when she has developed worsening chest discomfort, worse with inspiration, refractory to home medications including inhalers and pain meds. CTA chest was done in the ED which showed no PE but revealed new bilateral patchy consolidations, concerning for multifocal pneumonia. In addition, lab worked revealed and a lactate of 4.8 and AKI (Cr 1.84, no prior CKD) concerning for sepsis. Patient received azithromycin, Cefepime and was placed on BIPAP. The patient was admitted as stepdown by the hospitalist team and critical care was consulted for evaluation.   Patient seen and examined on arrival to the ICU. She is alert and oriented x3, in no distress on BIPAP with stable vital signs. Patient states she feels a lot better on the BIPAP although it is uncomfortable. She does complain of some generalized aches pains, but states she has chronic pain at baseline. She denies any chest pain, nausea or vomiting.   Chart reviewed. Significant lab values include: CO2 20, Glucose 192, Lactate 4.8-->1.1, Cr 1.84, Anion Gap 19, WBC 23.4  Pertinent  Medical History  COPD Depression  Anxiety  Micro Data:  5/24: Respiratory Viral Panel>>negative 5/24: Strep Pneumo urinary antigen>> POSITIVE 5/24: Legionella urinary antigen>>negative 5/24: Blood x2>> NGTD 5/24: MRSA PCR>> positive 5/24: Sputum>> gram + cocci, rare budding yeast  Antimicrobials:  Azithromycin 5/24 x1 dose Cefepime 5/24 x1 dose Ceftriaxone 5/25>>  Significant Hospital Events: Including procedures, antibiotic start and stop dates in  addition to other pertinent events   5/24: Admitted by hospitalist team sepsis 2/2 pneumonia requiring BiPAP.  PCCM consulted.  Weaned off BiPAP, transferred to Med-Surg 5/25: Remains on BiPAP per pt preference.  Strep Pneumo urinary antigen +, ABX changed to Ceftriaxone.  Add BD + Pulmicort nebs ~ weaned off BiPAP and transferred to Med-Surg.  PCCM sign off. 5/26: Developed acute respiratory distress requiring BiPAP and transferred to ICU.  PCCM reconsulted.  High risk for intubation  Interim History / Subjective:  -This morning patient developed acute respiratory distress requiring BiPAP and transferred to ICU -Given 40 mg IV Lasix by Hospitalist -High risk for intubation -Noted to have severe inspiratory expiratory wheezing upon auscultation ~we will give continuous nebulizer, 80 mg Solu-Medrol and increase scheduled Solu-Medrol dose -As needed Dilaudid for air hunger  Objective   Blood pressure (!) 132/94, pulse 72, temperature 98.5 F (36.9 C), temperature source Oral, resp. rate 17, height 5\' 5"  (1.651 m), weight 57.4 kg, SpO2 97 %.    FiO2 (%):  [35 %] 35 %   Intake/Output Summary (Last 24 hours) at 09/27/2021 1445 Last data filed at 09/27/2021 1400 Gross per 24 hour  Intake 1775.68 ml  Output 1900 ml  Net -124.32 ml    Filed Weights   09/23/21 2025  Weight: 57.4 kg    Examination: General: Critically ill-appearing female, laying in bed HEENT: Pupils equal and reactive, sclera clear, normocephalic Lungs: Scatterd inspiratory and expiratory wheezing, diminished breath sounds Cardiovascular: Heart sounds S1S2, no edema, warm and palpable pulses Abdomen: Soft, non-tender, bowel sounds active Extremities: No deformities Neuro: More alert this morning  Resolved Hospital Problem list  Elevated Lactate cleared  Assessment & Plan:   Acute hypoxemic respiratory failure, 2/2 Multifocal pneumonia & COPD Exacerbation -Supplemental O2 as needed to maintain O2 sats 88 to  92% -BiPAP, wean as tolerated -High risk for intubation, but much improved today -Follow intermittent Chest X-ray & ABG as needed -Bronchodilators & Pulmicort nebs -IV Steroids ~ Solu-Medrol to 40 mg twice daily  -As needed Dilaudid for air hunger -ABX as above -Pulmonary toilet as able -Given 40 mg IV Lasix by Hospitalist 5/26 -speech eval for potential aspiration   Sepsis due to Multifocal Pneumonia (Strep Pneumoniae Pneumonia) -Monitor fever curve -Trend WBC's & Procalcitonin -Follow cultures as above -Strep Pneumo urinary antigen + -Continue ceftriaxone   Acute Kidney Injury, 2/2 to shock state from above ~RESOVED -Monitor I&O's / urinary output -Follow BMP -Ensure adequate renal perfusion -Avoid nephrotoxic agents as able -Replace electrolytes as indicated  Anxiety Depression -Home medications: Zoloft 150mg  daily continued  Patient is critically ill, high risk for intubation, cardiac arrest and death.  If patient is still at risk for intubation, but improved from events of yesterday, likely would be difficult to wean from ventilator due to severe COPD.  Best Practice (right click and "Reselect all SmartList Selections" daily)   Diet/type: NPO, regular diet when weaned off BiPAP DVT prophylaxis: LMWH GI prophylaxis: PPI Lines: N/A Foley:  N/A Code Status:  full code Last date of multidisciplinary goals of care discussion [N/A]  Patient's spouse updated at bedside 5/26.  We discussed high risk for further deterioration requiring intubation.  Patient's spouse confirmed she is full code and would like intubation if needed.  All questions answered to their satisfaction.  Labs   CBC: Recent Labs  Lab 09/23/21 1424 09/24/21 0451 09/25/21 0521 09/26/21 0509 09/27/21 0408  WBC 23.4* 18.0* 18.7* 5.9 8.5  NEUTROABS 20.2*  --   --   --   --   HGB 11.5* 9.9* 8.9* 8.0* 8.3*  HCT 38.9 33.8* 30.1* 26.8* 27.6*  MCV 80.9 82.0 82.5 79.3* 79.1*  PLT 191 149* 146* 131* 131*      Basic Metabolic Panel: Recent Labs  Lab 09/23/21 1424 09/24/21 0451 09/25/21 0521 09/26/21 0509 09/26/21 2041 09/27/21 0408  NA 138 142 140 145  --  139  K 4.3 4.1 3.8 2.8* 3.6 4.1  CL 99 108 110 105  --  101  CO2 20* 25 25 32  --  33*  GLUCOSE 192* 127* 133* 146*  --  175*  BUN 36* 32* 41* 44*  --  43*  CREATININE 1.84* 1.05* 0.75 0.57  --  0.63  CALCIUM 9.7 8.5* 8.7* 9.0  --  8.6*  MG  --  1.8 2.1 2.3  --  2.0  PHOS  --  4.8*  --  2.4*  --  2.3*    GFR: Estimated Creatinine Clearance: 66.5 mL/min (by C-G formula based on SCr of 0.63 mg/dL). Recent Labs  Lab 09/23/21 1424 09/23/21 1724 09/23/21 2117 09/23/21 2328 09/24/21 0451 09/25/21 0521 09/26/21 0509 09/27/21 0408  PROCALCITON  --   --   --   --  10.68 6.17  --   --   WBC 23.4*  --   --   --  18.0* 18.7* 5.9 8.5  LATICACIDVEN 4.8* 1.8 2.0* 1.1  --   --   --   --      Liver Function Tests: Recent Labs  Lab 09/23/21 1424  AST 33  ALT 14  ALKPHOS 85  BILITOT 0.5  PROT 8.1  ALBUMIN 3.8    No results for input(s): LIPASE, AMYLASE in the last 168 hours. No results for input(s): AMMONIA in the last 168 hours.  ABG    Component Value Date/Time   PHART 7.31 (L) 09/24/2021 0119   PCO2ART 51 (H) 09/24/2021 0119   PO2ART 67 (L) 09/24/2021 0119   HCO3 25.7 09/24/2021 0119   ACIDBASEDEF 1.2 09/24/2021 0119   O2SAT 93.1 09/24/2021 0119      Coagulation Profile: No results for input(s): INR, PROTIME in the last 168 hours.  Cardiac Enzymes: No results for input(s): CKTOTAL, CKMB, CKMBINDEX, TROPONINI in the last 168 hours.  HbA1C: Hemoglobin A1C  Date/Time Value Ref Range Status  03/13/2014 04:26 AM 6.7 (H) 4.2 - 6.3 % Final    Comment:    The American Diabetes Association recommends that a primary goal of therapy should be <7% and that physicians should reevaluate the treatment regimen in patients with HbA1c values consistently >8%.     CBG: Recent Labs  Lab 09/23/21 2014  09/25/21 1129  GLUCAP 134* 206*     Review of Systems:   Unable to assess due to acute respiratory distress and BiPAP  Past Medical History:  She,  has a past medical history of Anemia, Anxiety, Asthma, COPD (chronic obstructive pulmonary disease) (HCC), Depression, and Ventral hernia.   Surgical History:   Past Surgical History:  Procedure Laterality Date   COLON SURGERY     EAR TUBE REMOVAL     FRACTURE SURGERY     HERNIA REPAIR     TUBAL LIGATION       Social History:   reports that she quit smoking about 17 months ago. Her smoking use included cigarettes. She smoked an average of .5 packs per day. She has never used smokeless tobacco. She reports that she does not drink alcohol and does not use drugs.   Family History:  Her family history includes Alcohol abuse in her father; Sarcoidosis in her mother.   Allergies Allergies  Allergen Reactions   Banana Nausea And Vomiting     Home Medications  Prior to Admission medications   Medication Sig Start Date End Date Taking? Authorizing Provider  acetaminophen (TYLENOL) 500 MG tablet Take 1,000 mg by mouth every 6 (six) hours as needed.    [provider]  albuterol (PROVENTIL HFA;VENTOLIN HFA) 108 (90 BASE) MCG/ACT inhaler Inhale 2 puffs into the lungs every 6 (six) hours as needed for wheezing or shortness of breath. 04/09/15   Adrian Saran, MD  albuterol (PROVENTIL) (2.5 MG/3ML) 0.083% nebulizer solution Take 2.5 mg by nebulization every 6 (six) hours as needed for wheezing or shortness of breath.    [provider]  ALPRAZolam Prudy Feeler) 0.5 MG tablet Take 0.5-1 mg by mouth 2 (two) times daily as needed for anxiety. 12/17/20 12/17/21  [provider]  dextromethorphan-guaiFENesin (MUCINEX DM) 30-600 MG 12hr tablet Take 1 tablet by mouth 2 (two) times daily. 03/20/21   Rolly Salter, MD  docusate sodium (COLACE) 100 MG capsule Take 100 mg by mouth daily as needed for mild constipation.    [provider]  gabapentin (NEURONTIN) 300 MG capsule Take 300 mg by mouth 3 (three) times daily. 12/17/20   [provider]  guaiFENesin (ROBITUSSIN) 100 MG/5ML liquid Take 5 mLs by mouth every 4 (four) hours as needed for cough or to loosen phlegm. 07/21/21   Delfino Lovett, MD  magnesium oxide (MAG-OX) 400 MG tablet Take 400 mg by  mouth daily.    [provider]  pantoprazole (PROTONIX) 40 MG tablet Take 40 mg by mouth daily.    [provider]  predniSONE (STERAPRED UNI-PAK 21 TAB) 10 MG (21) TBPK tablet Start 60 mg po daily, taper 10 mg daily until finish 07/21/21   Delfino LovettShah, Vipul, MD  sertraline (ZOLOFT) 100 MG tablet Take 150 mg by mouth daily.    [provider]  TRELEGY ELLIPTA 100-62.5-25 MCG/ACT AEPB Inhale 1 puff into the lungs daily. 07/10/21   [provider]     Critical care time: 40 minutes

## 2021-09-27 NOTE — Progress Notes (Addendum)
SLP Cancellation Note  Patient Details Name: Julia [name] MRN: [id number] DOB: [birth year]   Cancelled treatment:       Reason Eval/Treat Not Completed: Medical issues which prohibited therapy   SLP consult received and appreciated. Chart review completed. Per chart review, pt currently on BiPap. Will defer clinical swallowing evaluation pending improvements in respiratory status.  RN made aware.  Clyde Canterbury, M.S., CCC-SLP Speech-Language Pathologist [facility] [phone] Julia [name])  Julia [name] 09/27/2021, 8:19 AM

## 2021-09-28 ENCOUNTER — Inpatient Hospital Stay: Payer: 59

## 2021-09-28 DIAGNOSIS — Z87891 Personal history of nicotine dependence: Secondary | ICD-10-CM

## 2021-09-28 DIAGNOSIS — J189 Pneumonia, unspecified organism: Secondary | ICD-10-CM | POA: Diagnosis not present

## 2021-09-28 DIAGNOSIS — A403 Sepsis due to Streptococcus pneumoniae: Secondary | ICD-10-CM

## 2021-09-28 DIAGNOSIS — A419 Sepsis, unspecified organism: Secondary | ICD-10-CM | POA: Diagnosis not present

## 2021-09-28 LAB — CULTURE, BLOOD (ROUTINE X 2)
Culture: NO GROWTH
Culture: NO GROWTH
Special Requests: ADEQUATE

## 2021-09-28 LAB — CBC
HCT: 28.9 % — ABNORMAL LOW (ref 36.0–46.0)
Hemoglobin: 8.7 g/dL — ABNORMAL LOW (ref 12.0–15.0)
MCH: 23.6 pg — ABNORMAL LOW (ref 26.0–34.0)
MCHC: 30.1 g/dL (ref 30.0–36.0)
MCV: 78.5 fL — ABNORMAL LOW (ref 80.0–100.0)
Platelets: 154 10*3/uL (ref 150–400)
RBC: 3.68 MIL/uL — ABNORMAL LOW (ref 3.87–5.11)
RDW: 17 % — ABNORMAL HIGH (ref 11.5–15.5)
WBC: 7.1 10*3/uL (ref 4.0–10.5)
nRBC: 0 % (ref 0.0–0.2)

## 2021-09-28 LAB — BASIC METABOLIC PANEL
Anion gap: 7 (ref 5–15)
BUN: 33 mg/dL — ABNORMAL HIGH (ref 8–23)
CO2: 35 mmol/L — ABNORMAL HIGH (ref 22–32)
Calcium: 8.8 mg/dL — ABNORMAL LOW (ref 8.9–10.3)
Chloride: 97 mmol/L — ABNORMAL LOW (ref 98–111)
Creatinine, Ser: 0.64 mg/dL (ref 0.44–1.00)
GFR, Estimated: >60 mL/min (ref >60)
Glucose, Bld: 135 mg/dL — ABNORMAL HIGH (ref 70–99)
Potassium: 4 mmol/L (ref 3.5–5.1)
Sodium: 139 mmol/L (ref 135–145)

## 2021-09-28 LAB — CULTURE, RESPIRATORY W GRAM STAIN

## 2021-09-28 LAB — MAGNESIUM: Magnesium: 1.9 mg/dL (ref 1.7–2.4)

## 2021-09-28 LAB — PROCALCITONIN: Procalcitonin: 0.5 ng/mL

## 2021-09-28 MED ORDER — PREDNISONE 20 MG PO TABS
40.0000 mg | ORAL_TABLET | Freq: Every day | ORAL | Status: DC
  Administered 2021-09-28 – 2021-10-03 (×6): 40 mg via ORAL
  Filled 2021-09-28 (×6): qty 2

## 2021-09-28 MED ORDER — FUROSEMIDE 10 MG/ML IJ SOLN
40.0000 mg | Freq: Every day | INTRAMUSCULAR | Status: DC
  Administered 2021-09-28 – 2021-09-30 (×3): 40 mg via INTRAVENOUS
  Filled 2021-09-28 (×3): qty 4

## 2021-09-28 NOTE — Progress Notes (Signed)
Speech Language Pathology Treatment: Dysphagia  Patient Details Name: Julia [name] MRN: [id number] DOB: 1961/02/07 Today's Date: 09/28/2021 Time: 9024-0973 SLP Time Calculation (min) (ACUTE ONLY): 40 min  Assessment / Plan / Recommendation Clinical Impression  Pt seen for ongoing assessment of swallowing and toleration of current dys. 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 She was alert, verbally responsive and engaged in conversation w/ SLP; talkative -- often talked w/ food in her mouth/eating. She stated she was feeling that her breathing was "better" today. Pt is on 4L [place] O2 support; afebrile; wbc wnl.    Pt explained general aspiration precautions and agreed verbally to the need for following them especially sitting upright for all oral intake AND NO TALKING w/ food in mouth/eating. She fed herself trials of soft solids moistened well w/ condiments, mixed consistency foods (cereal/milk) and thin liquids via cup/straw. No immediate, overt clinical s/s of aspiration were noted w/ any consistency; respiratory status remained at her baseline which was fairly calm and unlabored, vocal quality clear b/t trials. Noted dry, nonproductive throat clearing x3-4 during her breakfast meal and when talking w/ food in her mouth x1. Oral phase appeared [facility] for bolus management, control, and timely A-P transfer for swallowing; mashing/gumming of increased textured trials was adequate in setting of Edentulous status and current respiratory status -- min extra Time needed for full bolus prep for swallow then oral clearing, but overall WFL. Oral clearing achieved w/ all consistencies.    Pt appears at reduced risk for aspriation when following general aspiration precautions in setting of admit for Acute hypoxemic respiratory failure, 2/2  Multifocal pneumonia(Strep Pneumoniae Pneumonia) & COPD Exacerbation.   Recommend continue a mech soft diet for ease of Cut, soft foods w/ gravies added to moisten foods; Thin liquids. Recommend general aspiration precautions; NO TALKING w/ food in mouth/eating. Rest Breaks when needed for conservation of energy during meals d/t increased mashing/gumming effort secondary to Edentulous status. Pills Whole in Puree if needed for ease of swallowing; tray setup and positioning assistance for meals. ST services will sign off at this time w/ MD to reconsult if needed while admitted. NSG updated. Precautions posted at bedside.     HPI HPI: Per H&P "Julia [name] is a 61 y.o. female with a known history of COPD, depression, anxiety presents to the emergency department for evaluation of chest pain, SOB.  Patient was in a usual state of health until 2-3 days ago when she has developed worsening chest discomfort, worse with inspiration, refractory to home medications including inhalers and pain meds.      Patient denies fevers/chills, weakness, dizziness, N/V/C/D, abdominal pain, dysuria/frequency, changes in mental status.      Otherwise there has been no change in status. Patient has been taking medication as prescribed and there has been no recent change in medication or diet.  No recent antibiotics.  There has been no recent illness, hospitalizations, travel or sick contacts." CXR 09/25/21 "Progressive bilateral interstitial and airspace opacities compared  to 09/23/2021 concerning for worsening multifocal pneumonia."      SLP Plan  All goals met      Recommendations for follow up therapy are one component of a multi-disciplinary discharge planning process, led by the attending physician.  Recommendations may be updated based on patient status, additional functional criteria  and insurance authorization.    Recommendations  Diet recommendations: Dysphagia 3 (mechanical soft);Thin liquid (for ease of  gumming/mashing; conservation of energy) Liquids provided via: Cup;Straw Medication Administration: Whole meds with liquid (vs Whole in Puree if needed) Supervision: Patient able to self feed (setup support) Compensations: Minimize environmental distractions;Slow rate;Small sips/bites;Lingual sweep for clearance of pocketing;Follow solids with liquid (rest breaks PRN for energy conservation; NO TALKING w/ food in mouth/during meals) Postural Changes and/or Swallow Maneuvers: Out of bed for meals;Seated upright 90 degrees;Upright 30-60 min after meal                General recommendations:  (Dietician f/u) Oral Care Recommendations: Oral care BID;Oral care before and after PO;Patient independent with oral care Follow Up Recommendations: No SLP follow up Assistance recommended at discharge: None SLP Visit Diagnosis: Dysphagia, unspecified (R13.10) (Edentulous status baseline; declined respiratory status) Plan: All goals met             [name], MS, CCC-SLP Speech Language Pathologist Re[facility]; [place] [phone] (ascom) Watson,Katherine  09/28/2021, 1:59 PM

## 2021-09-28 NOTE — Progress Notes (Signed)
PROGRESS NOTE    FAITHE ARIOLA  FOY:[id number] DOB: [birth year] DOA: 09/23/2021 PCP: Olena Leatherwood, FNP  124A/124A-AA   Assessment & Plan:   Principal Problem:   Sepsis due to pneumonia (HCC)   Julia [name] is a 61 y.o. female with a known history of COPD, depression, anxiety presents to the emergency department for evaluation of chest pain, SOB.  Patient was in a usual state of health until 2-3 days ago when she has developed worsening chest discomfort, worse with inspiration, refractory to home medications including inhalers and pain meds.   Multifocal PNA 2/2 strep pneumo --strep pneumo antigen pos --respiratory status worsened suddenly on 5/26 and repeat CXR on 5/26 read as "Progressive bilateral interstitial and airspace opacities compared to 09/23/2021 concerning for worsening multifocal pneumonia", however, CXR reviewed by myself, and I believe increased opacities were compatible with pulm edema, which was more consistent with pt's clinical presentation (received sepsis IVF on presentation, acute worsening needing BiPAP while already on appropriate abx, and quick respiratory improvement after IV lasix).   Plan: --cont ceftriaxone --No need for MRSA coverage at this point.  Pulm edema from Fluid overload --from sepsis IVF.   Improved with IV lasix. --cont IV lasix 40 mg daily  COPD exacerbation --transition from solumedrol to prednisone 40 mg daily --cont DuoNeb and pulmicort neb --cont Mucomyst neb  Acute on chronic hypoxemic respiratory failure --pt uses 2.5L O2 nightly at baseline --On presentation, needed BiPAP, weaned down to 6L O2 next day, however back on BiPAP again morning of 5/26, likely due to fluid overload from sepsis IVF.  Pt improved quickly after IV lasix, and O2 requirement back down to 6L on 5/27. --currently on 4L --Continue supplemental O2 to keep sats between 88-92%, wean as tolerated  Severe Sepsis secondary to multifocal  pneumonia --tachycardia, tachypnea, leukocytosis, AKI --tx with abx  # AKI --Cr 1.84 in presentation.  Improved to 1.05 with IVF, then improved even more with IV lasix to 0.57 --oral hydration   DVT prophylaxis: Lovenox SQ Code Status: Full code  Family Communication: husband updated at bedside today Level of care: Med-Surg Dispo:   The patient is from: home Anticipated d/c is to: home Anticipated d/c date is: 2-3 days Patient currently is not medically ready to d/c due to: IV abx for PNA, on 4L   Subjective and Interval History:  Breathing continued to improve.  O2 requirement down to 4L.     Objective: Vitals:   09/28/21 0507 09/28/21 0728 09/28/21 0820 09/28/21 1432  BP: 137/90  (!) 142/85   Pulse: 73  72   Resp: 18  16   Temp: (!) 97.5 F (36.4 C)  97.9 F (36.6 C)   TempSrc: Oral     SpO2: 98% 93% 95% 98%  Weight:      Height:        Intake/Output Summary (Last 24 hours) at 09/28/2021 1652 Last data filed at 09/28/2021 1135 Gross per 24 hour  Intake 340 ml  Output --  Net 340 ml   Filed Weights   09/23/21 2025  Weight: 57.4 kg    Examination:   Constitutional: NAD, AAOx3 HEENT: conjunctivae and lids normal, EOMI CV: No cyanosis.   RESP: normal respiratory effort, on 4L Extremities: No effusions, edema in BLE SKIN: warm, dry Neuro: II - XII grossly intact.   Psych: Normal mood and affect.  Appropriate judgement and reason   Data Reviewed: I have personally reviewed following labs and imaging studies  CBC:  Recent Labs  Lab 09/23/21 1424 09/24/21 0451 09/25/21 0521 09/26/21 0509 09/27/21 0408 09/28/21 0402  WBC 23.4* 18.0* 18.7* 5.9 8.5 7.1  NEUTROABS 20.2*  --   --   --   --   --   HGB 11.5* 9.9* 8.9* 8.0* 8.3* 8.7*  HCT 38.9 33.8* 30.1* 26.8* 27.6* 28.9*  MCV 80.9 82.0 82.5 79.3* 79.1* 78.5*  PLT 191 149* 146* 131* 131* 154   Basic Metabolic Panel: Recent Labs  Lab 09/24/21 0451 09/25/21 0521 09/26/21 0509 09/26/21 2041  09/27/21 0408 09/28/21 0402  NA 142 140 145  --  139 139  K 4.1 3.8 2.8* 3.6 4.1 4.0  CL 108 110 105  --  101 97*  CO2 25 25 32  --  33* 35*  GLUCOSE 127* 133* 146*  --  175* 135*  BUN 32* 41* 44*  --  43* 33*  CREATININE 1.05* 0.75 0.57  --  0.63 0.64  CALCIUM 8.5* 8.7* 9.0  --  8.6* 8.8*  MG 1.8 2.1 2.3  --  2.0 1.9  PHOS 4.8*  --  2.4*  --  2.3*  --    GFR: Estimated Creatinine Clearance: 66.5 mL/min (by C-G formula based on SCr of 0.64 mg/dL). Liver Function Tests: Recent Labs  Lab 09/23/21 1424  AST 33  ALT 14  ALKPHOS 85  BILITOT 0.5  PROT 8.1  ALBUMIN 3.8   No results for input(s): LIPASE, AMYLASE in the last 168 hours. No results for input(s): AMMONIA in the last 168 hours. Coagulation Profile: No results for input(s): INR, PROTIME in the last 168 hours. Cardiac Enzymes: No results for input(s): CKTOTAL, CKMB, CKMBINDEX, TROPONINI in the last 168 hours. BNP (last 3 results) No results for input(s): PROBNP in the last 8760 hours. HbA1C: No results for input(s): HGBA1C in the last 72 hours. CBG: Recent Labs  Lab 09/23/21 2014 09/25/21 1129  GLUCAP 134* 206*   Lipid Profile: No results for input(s): CHOL, HDL, LDLCALC, TRIG, CHOLHDL, LDLDIRECT in the last 72 hours. Thyroid Function Tests: No results for input(s): TSH, T4TOTAL, FREET4, T3FREE, THYROIDAB in the last 72 hours. Anemia Panel: No results for input(s): VITAMINB12, FOLATE, FERRITIN, TIBC, IRON, RETICCTPCT in the last 72 hours. Sepsis Labs: Recent Labs  Lab 09/23/21 1424 09/23/21 1724 09/23/21 2117 09/23/21 2328 09/24/21 0451 09/25/21 0521 09/28/21 0402  PROCALCITON  --   --   --   --  10.68 6.17 0.50  LATICACIDVEN 4.8* 1.8 2.0* 1.1  --   --   --     Recent Results (from the past 240 hour(s))  Blood Culture (routine x 2)     Status: None   Collection Time: 09/23/21  2:24 PM   Specimen: BLOOD  Result Value Ref Range Status   Specimen Description BLOOD RIGHT ANTECUBITAL  Final   Special  Requests   Final    BOTTLES DRAWN AEROBIC AND ANAEROBIC Blood Culture results may not be optimal due to an inadequate volume of blood received in culture bottles   Culture   Final    NO GROWTH 5 DAYS Performed at [facility], [address] [place], [place] [address]    Report Status 09/28/2021 FINAL  Final  MRSA Next Gen by PCR, Nasal     Status: Abnormal   Collection Time: 09/23/21  8:20 PM   Specimen: Nasal Mucosa; Nasal Swab  Result Value Ref Range Status   MRSA by PCR Next Gen DETECTED (A) NOT DETECTED Final  Comment: RESULT CALLED TO, READ BACK BY AND VERIFIED WITH: Paula [facility] 09/23/21 2207 MU (NOTE) The GeneXpert MRSA Assay (FDA approved for NASAL specimens only), is one component of a comprehensive MRSA colonization surveillance program. It is not intended to diagnose MRSA infection nor to guide or monitor treatment for MRSA infections. Test performance is not FDA approved in patients less than 37 years old. Performed at [facility], [address] [place], [place] [address]   Culture, blood (Routine X 2) w Reflex to ID Panel     Status: None   Collection Time: 09/23/21  9:20 PM   Specimen: BLOOD  Result Value Ref Range Status   Specimen Description BLOOD RIGHT HAND  Final   Special Requests   Final    BOTTLES DRAWN AEROBIC AND ANAEROBIC Blood Culture adequate volume   Culture   Final    NO GROWTH 5 DAYS Performed at [facility], [address] [place], [place] [address]    Report Status 09/28/2021 FINAL  Final  Respiratory (~20 pathogens) panel by PCR     Status: None   Collection Time: 09/23/21  9:33 PM   Specimen: Nasopharyngeal Swab; Respiratory  Result Value Ref Range Status   Adenovirus NOT DETECTED NOT DETECTED Final   Coronavirus 229E NOT DETECTED NOT DETECTED Final    Comment: (NOTE) The Coronavirus on the Respiratory Panel, DOES NOT test for the novel  Coronavirus (2019 nCoV)    Coronavirus HKU1 NOT DETECTED NOT  DETECTED Final   Coronavirus NL63 NOT DETECTED NOT DETECTED Final   Coronavirus OC43 NOT DETECTED NOT DETECTED Final   Metapneumovirus NOT DETECTED NOT DETECTED Final   Rhinovirus / Enterovirus NOT DETECTED NOT DETECTED Final   Influenza A NOT DETECTED NOT DETECTED Final   Influenza B NOT DETECTED NOT DETECTED Final   Parainfluenza Virus 1 NOT DETECTED NOT DETECTED Final   Parainfluenza Virus 2 NOT DETECTED NOT DETECTED Final   Parainfluenza Virus 3 NOT DETECTED NOT DETECTED Final   Parainfluenza Virus 4 NOT DETECTED NOT DETECTED Final   Respiratory Syncytial Virus NOT DETECTED NOT DETECTED Final   Bordetella pertussis NOT DETECTED NOT DETECTED Final   Bordetella Parapertussis NOT DETECTED NOT DETECTED Final   Chlamydophila pneumoniae NOT DETECTED NOT DETECTED Final   Mycoplasma pneumoniae NOT DETECTED NOT DETECTED Final    Comment: Performed at [facility], [address], [place], [place] [address]  SARS Coronavirus 2 by RT PCR (hospital order, performed in [facility] hospital lab) *cepheid single result test* Anterior Nasal Swab     Status: None   Collection Time: 09/24/21 11:48 AM   Specimen: Anterior Nasal Swab  Result Value Ref Range Status   SARS Coronavirus 2 by RT PCR NEGATIVE NEGATIVE Final    Comment: (NOTE) SARS-CoV-2 target nucleic acids are NOT DETECTED.  The SARS-CoV-2 RNA is generally detectable in upper and lower respiratory specimens during the acute phase of infection. The lowest concentration of SARS-CoV-2 viral copies this assay can detect is 250 copies / mL. A negative result does not preclude SARS-CoV-2 infection and should not be used as the sole basis for treatment or other patient management decisions.  A negative result may occur with improper specimen collection / handling, submission of specimen other than nasopharyngeal swab, presence of viral mutation(s) within the areas targeted by this assay, and inadequate number of viral copies (<250 copies  / mL). A negative result must be combined with clinical observations, patient history, and epidemiological information.  Fact Sheet for Patients:   RoadLapTop.co.zahttps://www.fda.gov/media/158405/download  Fact Sheet for Healthcare Providers: http://kim-miller.com/https://www.fda.gov/media/158404/download  This test is not yet approved or  cleared by the [place] FDA and has been authorized for detection and/or diagnosis of SARS-CoV-2 by FDA under an Emergency Use Authorization (EUA).  This EUA will remain in effect (meaning this test can be used) for the duration of the COVID-19 declaration under Section 564(b)(1) of the Act, 21 U.S.C. section 360bbb-3(b)(1), unless the authorization is terminated or revoked sooner.  Performed at [facility], [address] [place], [place] [address]   Expectorated Sputum Assessment w Gram Stain, Rflx to Resp Cult     Status: None   Collection Time: 09/24/21 12:28 PM   Specimen: Expectorated Sputum  Result Value Ref Range Status   Specimen Description EXPECTORATED SPUTUM  Final   Special Requests NONE  Final   Sputum evaluation   Final    THIS SPECIMEN IS ACCEPTABLE FOR SPUTUM CULTURE Performed at [facility], [address] [place], [place] [address]    Report Status 09/24/2021 FINAL  Final  Culture, Respiratory w Gram Stain     Status: None   Collection Time: 09/24/21 12:28 PM  Result Value Ref Range Status   Specimen Description   Final    EXPECTORATED SPUTUM Performed at [facility], [address] [place], [place] [address]    Special Requests   Final    NONE Reflexed from [id number] Performed at [facility], [address] [place], [place] [address]    Gram Stain   Final    ABUNDANT WBC PRESENT, PREDOMINANTLY PMN RARE GRAM POSITIVE COCCI RARE BUDDING YEAST SEEN Performed at [facility], [address] [place], [place] [address]    Culture   Final    FEW CANDIDA GLABRATA RARE METHICILLIN RESISTANT  STAPHYLOCOCCUS AUREUS    Report Status 09/28/2021 FINAL  Final   Organism ID, Bacteria METHICILLIN RESISTANT STAPHYLOCOCCUS AUREUS  Final      Susceptibility   Methicillin resistant staphylococcus aureus - MIC*    CIPROFLOXACIN >=8 RESISTANT Resistant     ERYTHROMYCIN >=8 RESISTANT Resistant     GENTAMICIN <=0.5 SENSITIVE Sensitive     OXACILLIN >=4 RESISTANT Resistant     TETRACYCLINE <=1 SENSITIVE Sensitive     VANCOMYCIN 1 SENSITIVE Sensitive     TRIMETH/SULFA <=10 SENSITIVE Sensitive     CLINDAMYCIN <=0.25 SENSITIVE Sensitive     RIFAMPIN <=0.5 SENSITIVE Sensitive     Inducible Clindamycin NEGATIVE Sensitive     * RARE METHICILLIN RESISTANT STAPHYLOCOCCUS AUREUS      Radiology Studies: DG Chest Port 1 View  Result Date: 09/28/2021 CLINICAL DATA:  Pneumonia. EXAM: PORTABLE CHEST 1 VIEW COMPARISON:  Sep 25, 2021. FINDINGS: Mildly improved aeration of the lungs with persistent interstitial and airspace opacities bilaterally. No visible pleural effusion or pneumothorax. Similar mild enlargement the cardiac silhouette. Probable hiatal hernia. IMPRESSION: Mildly improved aeration of the lungs with persistent interstitial and airspace opacities bilaterally. Electronically Signed   By: Feliberto HartsFrederick S Jones M.D.   On: 09/28/2021 12:22     Scheduled Meds:  acetylcysteine  4 mL Nebulization BID   budesonide (PULMICORT) nebulizer solution  0.5 mg Nebulization BID   dextromethorphan-guaiFENesin  1 tablet Oral BID   enoxaparin (LOVENOX) injection  40 mg Subcutaneous Q24H   furosemide  40 mg Intravenous Daily   gabapentin  300 mg Oral TID   ipratropium-albuterol  3 mL Nebulization Q6H   magnesium oxide  400 mg Oral Daily   mouth rinse  15 mL Mouth Rinse BID   mupirocin ointment  1 application. Nasal BID   pantoprazole  40 mg Oral Daily   predniSONE  40 mg Oral Q breakfast   sertraline  150 mg Oral Daily   Continuous Infusions:  sodium chloride Stopped (09/27/21 0015)     LOS: 5 days      Darlin Priestly, MD [facility] If 7PM-7AM, please contact night-coverage 09/28/2021, 4:52 PM

## 2021-09-28 NOTE — Consult Note (Signed)
NAMEPHOENICIA YBARRA  DOB: [birth year]  MRN: [id number]  Date/Time: 09/28/2021 12:38 PM  REQUESTING PROVIDER: Huston Foley Subjective:  REASON FOR CONSULT: strep pneumoniae infection ? Ysabella ELIZETTE PAFFORD is a 61 y.o. female with a history of COPD, asthma, smoker presents with 2 to 3 days history of chest pain on both sides of the lower aspect of her front of the chest worse with breathing as well as coughing.  She had used her inhalers with no improvement She did not have any fever or chills.  She has some cough with some greenish sputum She came to the ED on 09/23/2021.  In the ED Vitals BP of 88/60, temperature 96.3, pulse 81 and respiratory 27 with 90% sats. WBC 23, Hb 11.5, creatinine 1.84 and platelet 191.  Blood cultures were sent.  Chest x-ray showed new consolidation in the left lung with possible right lower lobe collapse.  CT chest revealed centrilobular emphysema.  Complete occlusion of the right lower lobe bronchus with near complete collapse of the right lower lobe.  New patchy bilateral consolidations.  Bilateral small solid pulmonary nodules similar to previous CT chest questioning chronic atypical infection.  Urine strep antigen was positive.  And patient's antibiotic was de-escalated to ceftriaxone.  MRSA nares swab was positive and she also had MRSA positive sputum.  And she was started on linezolid.  It was stopped  by the hospitalist as she did not think it was MRSA and I am asked to see the patient for the same.  Patient also had fluid overload and with Lasix it has improved a lot. Patient is doing much better We repeated a chest x-ray today and is much better than before Procalcitonin also is improved a lot Patient is also feeling better  Past Medical History:  Diagnosis Date   Anemia    Anxiety    Asthma    COPD (chronic obstructive pulmonary disease) ([place])    Depression    Ventral hernia     Past Surgical History:  Procedure Laterality Date   COLON SURGERY     EAR TUBE  REMOVAL     FRACTURE SURGERY     HERNIA REPAIR     TUBAL LIGATION      Social History   Socioeconomic History   Marital status: Married    Spouse name: Not on file   Number of children: Not on file   Years of education: Not on file   Highest education level: Not on file  Occupational History   Not on file  Tobacco Use   Smoking status: Former    Packs/day: 0.50    Types: Cigarettes    Quit date: 04/25/2020    Years since quitting: 1.4   Smokeless tobacco: Never  Substance and Sexual Activity   Alcohol use: No   Drug use: No   Sexual activity: Not on file  Other Topics Concern   Not on file  Social History Narrative   Not on file   Social Determinants of Health   Financial Resource Strain: Not on file  Food Insecurity: Not on file  Transportation Needs: Not on file  Physical Activity: Not on file  Stress: Not on file  Social Connections: Not on file  Intimate Partner Violence: Not on file    Family History  Problem Relation Age of Onset   Sarcoidosis Mother    Alcohol abuse Father    Allergies  Allergen Reactions   Banana Nausea And Vomiting   I? Current Facility-Administered Medications  Medication Dose Route Frequency Provider Last Rate Last Admin   0.9 %  sodium chloride infusion   Intravenous PRN Enzo Bi, MD   Stopped at 09/27/21 0015   acetaminophen (TYLENOL) tablet 1,000 mg  1,000 mg Oral TID PRN Enzo Bi, MD   1,000 mg at 09/24/21 1757   acetylcysteine (MUCOMYST) 20 % nebulizer / oral solution 4 mL  4 mL Nebulization BID Enzo Bi, MD   4 mL at 09/27/21 2024   ALPRAZolam Duanne Moron) tablet 0.5 mg  0.5 mg Oral BID PRN Rust-Chester, Toribio Harbour L, NP   0.5 mg at 09/27/21 2106   budesonide (PULMICORT) nebulizer solution 0.5 mg  0.5 mg Nebulization BID Darel Hong D, NP   0.5 mg at 09/28/21 [id number]   cyclobenzaprine (FLEXERIL) tablet 5 mg  5 mg Oral TID PRN Enzo Bi, MD   5 mg at 09/28/21 0955   dextromethorphan-guaiFENesin ([place] DM) 30-600 MG per 12 hr  tablet 1 tablet  1 tablet Oral BID Hugelmeyer, Alexis, DO   1 tablet at 09/28/21 [id number]   docusate sodium (COLACE) capsule 100 mg  100 mg Oral Daily PRN Hugelmeyer, Alexis, DO   100 mg at 09/27/21 2106   enoxaparin (LOVENOX) injection 40 mg  40 mg Subcutaneous Q24H Hugelmeyer, Alexis, DO   40 mg at 09/27/21 2107   furosemide (LASIX) injection 40 mg  40 mg Intravenous Daily Enzo Bi, MD   40 mg at 09/28/21 1033   gabapentin (NEURONTIN) capsule 300 mg  300 mg Oral TID Hugelmeyer, Alexis, DO   300 mg at 09/28/21 [id number]   ipratropium-albuterol (DUONEB) 0.5-2.5 (3) MG/3ML nebulizer solution 3 mL  3 mL Nebulization Q6H PRN Hugelmeyer, Alexis, DO   3 mL at 09/27/21 1831   ipratropium-albuterol (DUONEB) 0.5-2.5 (3) MG/3ML nebulizer solution 3 mL  3 mL Nebulization Q6H Darel Hong D, NP   3 mL at 09/28/21 0723   ketorolac (TORADOL) 15 MG/ML injection 15 mg  15 mg Intravenous Q6H PRN Enzo Bi, MD   15 mg at 09/28/21 0729   magnesium oxide (MAG-OX) tablet 400 mg  400 mg Oral Daily Hugelmeyer, Alexis, DO   400 mg at 09/28/21 [id number]   MEDLINE mouth rinse  15 mL Mouth Rinse BID Enzo Bi, MD   15 mL at 09/27/21 2107   melatonin tablet 5 mg  5 mg Oral QHS PRN Foust, Katy L, NP   5 mg at 09/27/21 2354   morphine (PF) 2 MG/ML injection 1 mg  1 mg Intravenous Q4H PRN Enzo Bi, MD   1 mg at 09/25/21 1848   mupirocin ointment (BACTROBAN) 2 % 1 application.  1 application. Nasal BID Enzo Bi, MD   1 application. at 09/28/21 0954   pantoprazole (PROTONIX) EC tablet 40 mg  40 mg Oral Daily Hugelmeyer, Alexis, DO   40 mg at 09/28/21 [id number]   predniSONE (DELTASONE) tablet 40 mg  40 mg Oral Q breakfast Enzo Bi, MD       sertraline (ZOLOFT) tablet 150 mg  150 mg Oral Daily Hugelmeyer, Alexis, DO   150 mg at 09/28/21 [id number]     Abtx:  Anti-infectives (From admission, onward)    Start     Dose/Rate Route Frequency Ordered Stop   09/27/21 1100  vancomycin (VANCOREADY) IVPB 1250 mg/250 mL  Status:  Discontinued        1,250  mg 166.7 mL/hr over 90 Minutes Intravenous Every 24 hours 09/26/21 2149 09/26/21 2156   09/26/21 2245  linezolid (ZYVOX) IVPB 600  mg  Status:  Discontinued        600 mg 300 mL/hr over 60 Minutes Intravenous Every 12 hours 09/26/21 2146 09/26/21 2147   09/26/21 2245  linezolid (ZYVOX) IVPB 600 mg  Status:  Discontinued        600 mg 300 mL/hr over 60 Minutes Intravenous Every 12 hours 09/26/21 2147 09/27/21 1100   09/26/21 2200  vancomycin (VANCOREADY) IVPB 1250 mg/250 mL  Status:  Discontinued        1,250 mg 166.7 mL/hr over 90 Minutes Intravenous STAT 09/26/21 2149 09/26/21 2156   09/25/21 0800  vancomycin (VANCOCIN) IVPB 1000 mg/200 mL premix  Status:  Discontinued        1,000 mg 200 mL/hr over 60 Minutes Intravenous Every 24 hours 09/24/21 0715 09/24/21 0810   09/24/21 1600  ceFEPIme (MAXIPIME) 2 g in sodium chloride 0.9 % 100 mL IVPB  Status:  Discontinued        2 g 200 mL/hr over 30 Minutes Intravenous Every 24 hours 09/24/21 0051 09/24/21 0733   09/24/21 1500  azithromycin (ZITHROMAX) 500 mg in sodium chloride 0.9 % 250 mL IVPB  Status:  Discontinued        500 mg 250 mL/hr over 60 Minutes Intravenous Every 24 hours 09/23/21 2037 09/24/21 0739   09/24/21 1000  ceFEPIme (MAXIPIME) 2 g in sodium chloride 0.9 % 100 mL IVPB  Status:  Discontinued        2 g 200 mL/hr over 30 Minutes Intravenous Every 12 hours 09/24/21 0733 09/24/21 0739   09/24/21 0830  cefTRIAXone (ROCEPHIN) 2 g in sodium chloride 0.9 % 100 mL IVPB        2 g 200 mL/hr over 30 Minutes Intravenous Daily 09/24/21 0739 09/28/21 1109   09/24/21 0800  vancomycin (VANCOREADY) IVPB 1250 mg/250 mL  Status:  Discontinued        1,250 mg 166.7 mL/hr over 90 Minutes Intravenous  Once 09/24/21 0712 09/24/21 0738   09/24/21 0100  cefTRIAXone (ROCEPHIN) 1 g in sodium chloride 0.9 % 100 mL IVPB  Status:  Discontinued        1 g 200 mL/hr over 30 Minutes Intravenous Every 24 hours 09/24/21 0007 09/24/21 0024   09/23/21 1500   ceFEPIme (MAXIPIME) 2 g in sodium chloride 0.9 % 100 mL IVPB        2 g 200 mL/hr over 30 Minutes Intravenous  Once 09/23/21 1456 09/23/21 1659   09/23/21 1500  azithromycin (ZITHROMAX) 500 mg in sodium chloride 0.9 % 250 mL IVPB        500 mg 250 mL/hr over 60 Minutes Intravenous  Once 09/23/21 1456 09/23/21 1623       REVIEW OF SYSTEMS:  Const: negative fever, negative chills, negative weight loss Eyes: negative diplopia or visual changes, negative eye pain ENT: negative coryza, negative sore throat Resp:  cough, initial., dyspnea Cards: Bilateral inframammary chest pain GU: negative for frequency, dysuria and hematuria GI: Negative for abdominal pain, diarrhea, bleeding, constipation Skin: negative for rash and pruritus Heme: Easy bruising MS: Weakness and fatigue Neurolo:negative for headaches, dizziness, vertigo, memory problems  Psych: negative for feelings of anxiety, depression  Endocrine: negative for thyroid, diabetes Allergy/Immunology-no medication allergy Objective:  VITALS:  BP (!) 142/85 (BP Location: Left Arm)   Pulse 72   Temp 97.9 F (36.6 C)   Resp 16   Ht 5\' 5"  (1.651 m)   Wt 57.4 kg   SpO2 95%   BMI 21.06 kg/m  General: Alert, cooperative, no distress, appears stated age.  Head: Normocephalic, without obvious abnormality, atraumatic. Eyes: Conjunctivae clear, anicteric sclerae. Pupils are equal ENT Nares normal. No drainage or sinus tenderness. Lips, mucosa, and tongue normal. No Thrush Neck: Supple, symmetrical, no adenopathy, thyroid: non tender no carotid bruit and no JVD. Back: No CVA tenderness. Lungs: Bilateral air entry. Rhonchi present Few crypts in the bases Heart: Regular rate and rhythm, no murmur, rub or gallop. Abdomen: Soft, non-tender,not distended. Bowel sounds normal. No masses Extremities: atraumatic, no cyanosis. No edema. No clubbing Skin: No rashes or lesions. Or bruising Lymph: Cervical, supraclavicular  normal. Neurologic: Grossly non-focal Pertinent Labs Lab Results CBC    Component Value Date/Time   WBC 7.1 09/28/2021 0402   RBC 3.68 (L) 09/28/2021 0402   HGB 8.7 (L) 09/28/2021 0402   HGB 8.3 (L) 04/02/2014 0647   HCT 28.9 (L) 09/28/2021 0402   HCT 26.7 (L) 04/02/2014 0647   PLT 154 09/28/2021 0402   PLT 243 04/02/2014 0647   MCV 78.5 (L) 09/28/2021 0402   MCV 79 (L) 04/02/2014 0647   MCH 23.6 (L) 09/28/2021 0402   MCHC 30.1 09/28/2021 0402   RDW 17.0 (H) 09/28/2021 0402   RDW 21.4 (H) 04/02/2014 0647   LYMPHSABS 0.4 (L) 09/23/2021 1424   LYMPHSABS 0.9 (L) 04/02/2014 0647   MONOABS 0.4 09/23/2021 1424   MONOABS 0.4 04/02/2014 0647   EOSABS 0.0 09/23/2021 1424   EOSABS 0.0 04/02/2014 0647   BASOSABS 0.1 09/23/2021 1424   BASOSABS 0.0 04/02/2014 0647       Latest Ref Rng & Units 09/28/2021    4:02 AM 09/27/2021    4:08 AM 09/26/2021    8:41 PM  CMP  Glucose 70 - 99 mg/dL 135   175     BUN 8 - 23 mg/dL 33   43     Creatinine 0.44 - 1.00 mg/dL 0.64   0.63     Sodium 135 - 145 mmol/L 139   139     Potassium 3.5 - 5.1 mmol/L 4.0   4.1   3.6    Chloride 98 - 111 mmol/L 97   101     CO2 22 - 32 mmol/L 35   33     Calcium 8.9 - 10.3 mg/dL 8.8   8.6         Microbiology: Recent Results (from the past 240 hour(s))  Blood Culture (routine x 2)     Status: None   Collection Time: 09/23/21  2:24 PM   Specimen: BLOOD  Result Value Ref Range Status   Specimen Description BLOOD RIGHT ANTECUBITAL  Final   Special Requests   Final    BOTTLES DRAWN AEROBIC AND ANAEROBIC Blood Culture results may not be optimal due to an inadequate volume of blood received in culture bottles   Culture   Final    NO GROWTH 5 DAYS Performed at [facility], [address] [place], [place] [address]    Report Status 09/28/2021 FINAL  Final  MRSA Next Gen by PCR, Nasal     Status: Abnormal   Collection Time: 09/23/21  8:20 PM   Specimen: Nasal Mucosa; Nasal Swab  Result Value Ref  Range Status   MRSA by PCR Next Gen DETECTED (A) NOT DETECTED Final    Comment: RESULT CALLED TO, READ BACK BY AND VERIFIED WITH: Florentina Jenny [facility] 09/23/21 2207 MU (NOTE) The GeneXpert MRSA Assay (FDA approved for NASAL specimens only), is one component of  a comprehensive MRSA colonization surveillance program. It is not intended to diagnose MRSA infection nor to guide or monitor treatment for MRSA infections. Test performance is not FDA approved in patients less than 56 years old. Performed at [facility], [address] [place], [place] [address]   Culture, blood (Routine X 2) w Reflex to ID Panel     Status: None   Collection Time: 09/23/21  9:20 PM   Specimen: BLOOD  Result Value Ref Range Status   Specimen Description BLOOD RIGHT HAND  Final   Special Requests   Final    BOTTLES DRAWN AEROBIC AND ANAEROBIC Blood Culture adequate volume   Culture   Final    NO GROWTH 5 DAYS Performed at [facility], [address] [place], [place] [address]    Report Status 09/28/2021 FINAL  Final  Respiratory (~20 pathogens) panel by PCR     Status: None   Collection Time: 09/23/21  9:33 PM   Specimen: Nasopharyngeal Swab; Respiratory  Result Value Ref Range Status   Adenovirus NOT DETECTED NOT DETECTED Final   Coronavirus 229E NOT DETECTED NOT DETECTED Final    Comment: (NOTE) The Coronavirus on the Respiratory Panel, DOES NOT test for the novel  Coronavirus (2019 nCoV)    Coronavirus HKU1 NOT DETECTED NOT DETECTED Final   Coronavirus NL63 NOT DETECTED NOT DETECTED Final   Coronavirus OC43 NOT DETECTED NOT DETECTED Final   Metapneumovirus NOT DETECTED NOT DETECTED Final   Rhinovirus / Enterovirus NOT DETECTED NOT DETECTED Final   Influenza A NOT DETECTED NOT DETECTED Final   Influenza B NOT DETECTED NOT DETECTED Final   Parainfluenza Virus 1 NOT DETECTED NOT DETECTED Final   Parainfluenza Virus 2 NOT DETECTED NOT DETECTED Final   Parainfluenza Virus 3 NOT DETECTED  NOT DETECTED Final   Parainfluenza Virus 4 NOT DETECTED NOT DETECTED Final   Respiratory Syncytial Virus NOT DETECTED NOT DETECTED Final   Bordetella pertussis NOT DETECTED NOT DETECTED Final   Bordetella Parapertussis NOT DETECTED NOT DETECTED Final   Chlamydophila pneumoniae NOT DETECTED NOT DETECTED Final   Mycoplasma pneumoniae NOT DETECTED NOT DETECTED Final    Comment: Performed at [facility], [place], [place], [place] [address]  SARS Coronavirus 2 by RT PCR (hospital order, performed in [facility] hospital lab) *cepheid single result test* Anterior Nasal Swab     Status: None   Collection Time: 09/24/21 11:48 AM   Specimen: Anterior Nasal Swab  Result Value Ref Range Status   SARS Coronavirus 2 by RT PCR NEGATIVE NEGATIVE Final    Comment: (NOTE) SARS-CoV-2 target nucleic acids are NOT DETECTED.  The SARS-CoV-2 RNA is generally detectable in upper and lower respiratory specimens during the acute phase of infection. The lowest concentration of SARS-CoV-2 viral copies this assay can detect is 250 copies / mL. A negative result does not preclude SARS-CoV-2 infection and should not be used as the sole basis for treatment or other patient management decisions.  A negative result may occur with improper specimen collection / handling, submission of specimen other than nasopharyngeal swab, presence of viral mutation(s) within the areas targeted by this assay, and inadequate number of viral copies (<250 copies / mL). A negative result must be combined with clinical observations, patient history, and epidemiological information.  Fact Sheet for Patients:   https://www.patel.info/  Fact Sheet for Healthcare Providers: https://hall.com/  This test is not yet approved or  cleared by the [place] FDA and has  been authorized for detection and/or diagnosis of SARS-CoV-2 by FDA under an Emergency Use Authorization (EUA).  This  EUA will remain in effect (meaning this test can be used) for the duration of the COVID-19 declaration under Section 564(b)(1) of the Act, 21 U.S.C. section 360bbb-3(b)(1), unless the authorization is terminated or revoked sooner.  Performed at [facility], [address] [place], [place] [address]   Expectorated Sputum Assessment w Gram Stain, Rflx to Resp Cult     Status: None   Collection Time: 09/24/21 12:28 PM   Specimen: Expectorated Sputum  Result Value Ref Range Status   Specimen Description EXPECTORATED SPUTUM  Final   Special Requests NONE  Final   Sputum evaluation   Final    THIS SPECIMEN IS ACCEPTABLE FOR SPUTUM CULTURE Performed at [facility], [address] [place], [place] [address]    Report Status 09/24/2021 FINAL  Final  Culture, Respiratory w Gram Stain     Status: None   Collection Time: 09/24/21 12:28 PM  Result Value Ref Range Status   Specimen Description   Final    EXPECTORATED SPUTUM Performed at [facility], [address], [place], [place] [address]    Special Requests   Final    NONE Reflexed from [id number] Performed at [facility], [address] [place], [place] [address]    Gram Stain   Final    ABUNDANT WBC PRESENT, PREDOMINANTLY PMN RARE GRAM POSITIVE COCCI RARE BUDDING YEAST SEEN Performed at [facility], [place] [address] [place], [place] [address]    Culture   Final    FEW CANDIDA GLABRATA RARE METHICILLIN RESISTANT STAPHYLOCOCCUS AUREUS    Report Status 09/28/2021 FINAL  Final   Organism ID, Bacteria METHICILLIN RESISTANT STAPHYLOCOCCUS AUREUS  Final      Susceptibility   Methicillin resistant staphylococcus aureus - MIC*    CIPROFLOXACIN >=8 RESISTANT Resistant     ERYTHROMYCIN >=8 RESISTANT Resistant     GENTAMICIN <=0.5 SENSITIVE Sensitive     OXACILLIN >=4 RESISTANT Resistant     TETRACYCLINE <=1 SENSITIVE Sensitive     VANCOMYCIN 1 SENSITIVE Sensitive     TRIMETH/SULFA <=10  SENSITIVE Sensitive     CLINDAMYCIN <=0.25 SENSITIVE Sensitive     RIFAMPIN <=0.5 SENSITIVE Sensitive     Inducible Clindamycin NEGATIVE Sensitive     * RARE METHICILLIN RESISTANT STAPHYLOCOCCUS AUREUS    IMAGING RESULTS: 09/28/21   09/23/21  I have personally reviewed the films ? Impression/Recommendation 61 year old female presenting with shortness of breath, bilateral inframammary chest pain cough and green sputum Found to have strep urine antigen  Community-acquired pneumonia secondary to strep pneumo.  Patient is on appropriate antibiotic for the last 6 days and has finished it today She is much improved  Pulmonary edema secondary to fluid overload has responded with Lasix  MRSA in nares and sputum culture is likely colonization.  Unlikely to be MRSA pneumonia as the chest x-ray has improved very much with strep pneumo treatment eventhough she received 2 doses of linezolid. Marland Kitchen   COPD  Current smoker.  Not thinking about quitting right now ___________________________________________________ Discussed with patient, and her husband at bedside and requesting provider ID will sign off- call if needed Note:  This document was prepared using Dragon voice recognition software and may include unintentional dictation errors.

## 2021-09-29 DIAGNOSIS — J13 Pneumonia due to Streptococcus pneumoniae: Secondary | ICD-10-CM

## 2021-09-29 DIAGNOSIS — Z87891 Personal history of nicotine dependence: Secondary | ICD-10-CM | POA: Diagnosis not present

## 2021-09-29 DIAGNOSIS — A419 Sepsis, unspecified organism: Secondary | ICD-10-CM | POA: Diagnosis not present

## 2021-09-29 DIAGNOSIS — J189 Pneumonia, unspecified organism: Secondary | ICD-10-CM | POA: Diagnosis not present

## 2021-09-29 NOTE — Progress Notes (Signed)
   Date of Admission:  09/23/2021       Subjective: Pt is doing better Cough better Sob better No chest pain  Medications:   acetylcysteine  4 mL Nebulization BID   budesonide (PULMICORT) nebulizer solution  0.5 mg Nebulization BID   dextromethorphan-guaiFENesin  1 tablet Oral BID   enoxaparin (LOVENOX) injection  40 mg Subcutaneous Q24H   furosemide  40 mg Intravenous Daily   gabapentin  300 mg Oral TID   ipratropium-albuterol  3 mL Nebulization Q6H   magnesium oxide  400 mg Oral Daily   mouth rinse  15 mL Mouth Rinse BID   mupirocin ointment  1 application. Nasal BID   pantoprazole  40 mg Oral Daily   predniSONE  40 mg Oral Q breakfast   sertraline  150 mg Oral Daily    Objective: Vital signs in last 24 hours: Temp:  [97.6 F (36.4 C)-98.6 F (37 C)] 98.6 F (37 C) (05/30 0818) Pulse Rate:  [70-83] 70 (05/30 0818) Resp:  [20-22] 22 (05/30 0818) BP: (132-149)/(82-94) 149/93 (05/30 0818) SpO2:  [91 %-100 %] 98 % (05/30 1356) FiO2 (%):  [35 %] 35 % (05/30 0159)   PHYSICAL EXAM:  General: Alert, cooperative, no distress, appears stated age. On nasal oxygen . Lungs: b/l air entry crepts Heart: Regular rate and rhythm, no murmur, rub or gallop. Abdomen: Soft, non-tender,not distended. Bowel sounds normal. No masses Extremities: atraumatic, no cyanosis. No edema. No clubbing Skin: No rashes or lesions. Or bruising Lymph: Cervical, supraclavicular normal. Neurologic: Grossly non-focal  Lab Results Recent Labs    09/27/21 0408 09/28/21 0402  WBC 8.5 7.1  HGB 8.3* 8.7*  HCT 27.6* 28.9*  NA 139 139  K 4.1 4.0  CL 101 97*  CO2 33* 35*  BUN 43* 33*  CREATININE 0.63 0.64  Microbiology:  Studies/Results: DG Chest Port 1 View  Result Date: 09/28/2021 CLINICAL DATA:  Pneumonia. EXAM: PORTABLE CHEST 1 VIEW COMPARISON:  Sep 25, 2021. FINDINGS: Mildly improved aeration of the lungs with persistent interstitial and airspace opacities bilaterally. No visible pleural  effusion or pneumothorax. Similar mild enlargement the cardiac silhouette. Probable hiatal hernia. IMPRESSION: Mildly improved aeration of the lungs with persistent interstitial and airspace opacities bilaterally. Electronically Signed   By: Feliberto Harts M.D.   On: 09/28/2021 12:22     Assessment/Plan: 61 year old female presenting with shortness of breath, bilateral inframammary chest pain and cough with green sputum.  Found to have strep urine antigen.  Being treated as community-acquired pneumonia secondary to Streptococcus pneumoniae. Patient completed 6 days of appropriate antibiotic.  She is much improved  MRSA in nares and sputum cultures likely colonization.  Unlikely this is MRSA pneumonia.  Chest x-ray has cleared up nicely and so is the patient's condition.  She did receive the 2 doses of linezolid and 1 dose of vancomycin.  Does not need any more treatment currently  COPD exacerbation improving  Pulmonary edema secondary to fluid overload and it has responded to Lasix.  Current smoker.  Not contemplating quitting right now  Discussed the management with the patient and her husband at bedside ID will sign off call if needed.

## 2021-09-29 NOTE — Progress Notes (Addendum)
PROGRESS NOTE    Julia [name]  YKZ:[id number] DOB: 09-19-1960 DOA: 09/23/2021 PCP: Olena Leatherwood, FNP  124A/124A-AA   Assessment & Plan:   Principal Problem:   Sepsis due to pneumonia (HCC)   Julia [name] is a 61 y.o. female with a known history of COPD, depression, anxiety presents to the emergency department for evaluation of chest pain, SOB.  Patient was in a usual state of health until 2-3 days ago when she has developed worsening chest discomfort, worse with inspiration, refractory to home medications including inhalers and pain meds.   Multifocal PNA 2/2 strep pneumo --strep pneumo antigen pos --respiratory status worsened suddenly on 5/26 and repeat CXR on 5/26 read as "Progressive bilateral interstitial and airspace opacities compared to 09/23/2021 concerning for worsening multifocal pneumonia", however, CXR reviewed by myself, and I believe increased opacities were compatible with pulm edema, which was more consistent with pt's clinical presentation (received sepsis IVF on presentation, acute worsening needing BiPAP while already on appropriate abx, and quick respiratory improvement after IV lasix).   --completed 6 days of abx with cefepime then ceftriaxone Plan: --No need for MRSA PNA, agreed with ID  Acute Pulm edema from Fluid overload, not POA --from sepsis IVF.   Improved with IV lasix. --cont IV lasix 40 mg daily  COPD exacerbation --transitioned from solumedrol to prednisone 40 mg daily --cont prednisone 40 mg daily --cont DuoNeb and pulmicort neb --cont Mucomyst neb  Acute on chronic hypoxemic respiratory failure --pt uses 2.5L O2 nightly at baseline --On presentation, needed BiPAP, weaned down to 6L O2 next day, however back on BiPAP again morning of 5/26, likely due to fluid overload from sepsis IVF.  Pt improved quickly after IV lasix, and O2 requirement back down to 6L on 5/27. --currently on 4L --Continue supplemental O2 to keep sats between  88-92%, wean as tolerated --can consider discharge when O2 requirement comes down to 2L with walking.  Severe Sepsis secondary to multifocal pneumonia --tachycardia, tachypnea, leukocytosis, AKI --treated with abx  # AKI --Cr 1.84 in presentation.  Improved to 1.05 with IVF, then improved even more with IV lasix to 0.57 --oral hydration   DVT prophylaxis: Lovenox SQ Code Status: Full code  Family Communication: husband updated at bedside today Level of care: Med-Surg Dispo:   The patient is from: home Anticipated d/c is to: home Anticipated d/c date is: 1-2 days Patient currently is not medically ready to d/c due to: on 4L O2   Subjective and Interval History:  Pt reported mucomyst neb helped a lot.  Still making good amount of urine with IV lasix.   Objective: Vitals:   09/29/21 1356 09/29/21 1642 09/29/21 2002 09/29/21 2033  BP:  119/72  129/77  Pulse:  71  78  Resp:    18  Temp:  98 F (36.7 C)  98.1 F (36.7 C)  TempSrc:      SpO2: 98% 96% 96% 92%  Weight:      Height:        Intake/Output Summary (Last 24 hours) at 09/29/2021 2057 Last data filed at 09/29/2021 0900 Gross per 24 hour  Intake 240 ml  Output --  Net 240 ml   Filed Weights   09/23/21 2025  Weight: 57.4 kg    Examination:   Constitutional: NAD, AAOx3 HEENT: conjunctivae and lids normal, EOMI CV: No cyanosis.   RESP: normal respiratory effort, coarse lung sounds and diffuse rhonchi, on 4L Extremities: No effusions, edema in BLE SKIN: warm, dry  Neuro: II - XII grossly intact.   Psych: Normal mood and affect.  Appropriate judgement and reason   Data Reviewed: I have personally reviewed following labs and imaging studies  CBC: Recent Labs  Lab 09/23/21 1424 09/24/21 0451 09/25/21 0521 09/26/21 0509 09/27/21 0408 09/28/21 0402  WBC 23.4* 18.0* 18.7* 5.9 8.5 7.1  NEUTROABS 20.2*  --   --   --   --   --   HGB 11.5* 9.9* 8.9* 8.0* 8.3* 8.7*  HCT 38.9 33.8* 30.1* 26.8* 27.6* 28.9*   MCV 80.9 82.0 82.5 79.3* 79.1* 78.5*  PLT 191 149* 146* 131* 131* 154   Basic Metabolic Panel: Recent Labs  Lab 09/24/21 0451 09/25/21 0521 09/26/21 0509 09/26/21 2041 09/27/21 0408 09/28/21 0402  NA 142 140 145  --  139 139  K 4.1 3.8 2.8* 3.6 4.1 4.0  CL 108 110 105  --  101 97*  CO2 25 25 32  --  33* 35*  GLUCOSE 127* 133* 146*  --  175* 135*  BUN 32* 41* 44*  --  43* 33*  CREATININE 1.05* 0.75 0.57  --  0.63 0.64  CALCIUM 8.5* 8.7* 9.0  --  8.6* 8.8*  MG 1.8 2.1 2.3  --  2.0 1.9  PHOS 4.8*  --  2.4*  --  2.3*  --    GFR: Estimated Creatinine Clearance: 66.5 mL/min (by C-G formula based on SCr of 0.64 mg/dL). Liver Function Tests: Recent Labs  Lab 09/23/21 1424  AST 33  ALT 14  ALKPHOS 85  BILITOT 0.5  PROT 8.1  ALBUMIN 3.8   No results for input(s): LIPASE, AMYLASE in the last 168 hours. No results for input(s): AMMONIA in the last 168 hours. Coagulation Profile: No results for input(s): INR, PROTIME in the last 168 hours. Cardiac Enzymes: No results for input(s): CKTOTAL, CKMB, CKMBINDEX, TROPONINI in the last 168 hours. BNP (last 3 results) No results for input(s): PROBNP in the last 8760 hours. HbA1C: No results for input(s): HGBA1C in the last 72 hours. CBG: Recent Labs  Lab 09/23/21 2014 09/25/21 1129  GLUCAP 134* 206*   Lipid Profile: No results for input(s): CHOL, HDL, LDLCALC, TRIG, CHOLHDL, LDLDIRECT in the last 72 hours. Thyroid Function Tests: No results for input(s): TSH, T4TOTAL, FREET4, T3FREE, THYROIDAB in the last 72 hours. Anemia Panel: No results for input(s): VITAMINB12, FOLATE, FERRITIN, TIBC, IRON, RETICCTPCT in the last 72 hours. Sepsis Labs: Recent Labs  Lab 09/23/21 1424 09/23/21 1724 09/23/21 2117 09/23/21 2328 09/24/21 0451 09/25/21 0521 09/28/21 0402  PROCALCITON  --   --   --   --  10.68 6.17 0.50  LATICACIDVEN 4.8* 1.8 2.0* 1.1  --   --   --     Recent Results (from the past 240 hour(s))  Blood Culture  (routine x 2)     Status: None   Collection Time: 09/23/21  2:24 PM   Specimen: BLOOD  Result Value Ref Range Status   Specimen Description BLOOD RIGHT ANTECUBITAL  Final   Special Requests   Final    BOTTLES DRAWN AEROBIC AND ANAEROBIC Blood Culture results may not be optimal due to an inadequate volume of blood received in culture bottles   Culture   Final    NO GROWTH 5 DAYS Performed at [facility], [address] [place], [place] [address]    Report Status 09/28/2021 FINAL  Final  MRSA Next Gen by PCR, Nasal     Status: Abnormal  Collection Time: 09/23/21  8:20 PM   Specimen: Nasal Mucosa; Nasal Swab  Result Value Ref Range Status   MRSA by PCR Next Gen DETECTED (A) NOT DETECTED Final    Comment: RESULT CALLED TO, READ BACK BY AND VERIFIED WITH: Paula Compton [facility] 09/23/21 2207 MU (NOTE) The GeneXpert MRSA Assay (FDA approved for NASAL specimens only), is one component of a comprehensive MRSA colonization surveillance program. It is not intended to diagnose MRSA infection nor to guide or monitor treatment for MRSA infections. Test performance is not FDA approved in patients less than 48 years old. Performed at [facility], [address] [place], [place] [address]   Culture, blood (Routine X 2) w Reflex to ID Panel     Status: None   Collection Time: 09/23/21  9:20 PM   Specimen: BLOOD  Result Value Ref Range Status   Specimen Description BLOOD RIGHT HAND  Final   Special Requests   Final    BOTTLES DRAWN AEROBIC AND ANAEROBIC Blood Culture adequate volume   Culture   Final    NO GROWTH 5 DAYS Performed at [facility], [address] [place], [place] [address]    Report Status 09/28/2021 FINAL  Final  Respiratory (~20 pathogens) panel by PCR     Status: None   Collection Time: 09/23/21  9:33 PM   Specimen: Nasopharyngeal Swab; Respiratory  Result Value Ref Range Status   Adenovirus NOT DETECTED NOT DETECTED Final   Coronavirus 229E NOT  DETECTED NOT DETECTED Final    Comment: (NOTE) The Coronavirus on the Respiratory Panel, DOES NOT test for the novel  Coronavirus (2019 nCoV)    Coronavirus HKU1 NOT DETECTED NOT DETECTED Final   Coronavirus NL63 NOT DETECTED NOT DETECTED Final   Coronavirus OC43 NOT DETECTED NOT DETECTED Final   Metapneumovirus NOT DETECTED NOT DETECTED Final   Rhinovirus / Enterovirus NOT DETECTED NOT DETECTED Final   Influenza A NOT DETECTED NOT DETECTED Final   Influenza B NOT DETECTED NOT DETECTED Final   Parainfluenza Virus 1 NOT DETECTED NOT DETECTED Final   Parainfluenza Virus 2 NOT DETECTED NOT DETECTED Final   Parainfluenza Virus 3 NOT DETECTED NOT DETECTED Final   Parainfluenza Virus 4 NOT DETECTED NOT DETECTED Final   Respiratory Syncytial Virus NOT DETECTED NOT DETECTED Final   Bordetella pertussis NOT DETECTED NOT DETECTED Final   Bordetella Parapertussis NOT DETECTED NOT DETECTED Final   Chlamydophila pneumoniae NOT DETECTED NOT DETECTED Final   Mycoplasma pneumoniae NOT DETECTED NOT DETECTED Final    Comment: Performed at [facility], [address] [place], [place] [address]  SARS Coronavirus 2 by RT PCR (hospital order, performed in [facility] hospital lab) *cepheid single result test* Anterior Nasal Swab     Status: None   Collection Time: 09/24/21 11:48 AM   Specimen: Anterior Nasal Swab  Result Value Ref Range Status   SARS Coronavirus 2 by RT PCR NEGATIVE NEGATIVE Final    Comment: (NOTE) SARS-CoV-2 target nucleic acids are NOT DETECTED.  The SARS-CoV-2 RNA is generally detectable in upper and lower respiratory specimens during the acute phase of infection. The lowest concentration of SARS-CoV-2 viral copies this assay can detect is 250 copies / mL. A negative result does not preclude SARS-CoV-2 infection and should not be used as the sole basis for treatment or other patient management decisions.  A negative result may occur with improper specimen collection /  handling, submission of specimen other than nasopharyngeal swab, presence of  viral mutation(s) within the areas targeted by this assay, and inadequate number of viral copies (<250 copies / mL). A negative result must be combined with clinical observations, patient history, and epidemiological information.  Fact Sheet for Patients:   RoadLapTop.co.zahttps://www.fda.gov/media/158405/download  Fact Sheet for Healthcare Providers: http://kim-miller.com/https://www.fda.gov/media/158404/download  This test is not yet approved or  cleared by the [place] FDA and has been authorized for detection and/or diagnosis of SARS-CoV-2 by FDA under an Emergency Use Authorization (EUA).  This EUA will remain in effect (meaning this test can be used) for the duration of the COVID-19 declaration under Section 564(b)(1) of the Act, 21 U.S.C. section 360bbb-3(b)(1), unless the authorization is terminated or revoked sooner.  Performed at [facility], [address] [place], [place] [address]   Expectorated Sputum Assessment w Gram Stain, Rflx to Resp Cult     Status: None   Collection Time: 09/24/21 12:28 PM   Specimen: Expectorated Sputum  Result Value Ref Range Status   Specimen Description EXPECTORATED SPUTUM  Final   Special Requests NONE  Final   Sputum evaluation   Final    THIS SPECIMEN IS ACCEPTABLE FOR SPUTUM CULTURE Performed at [facility], [address] [place], [place] [address]    Report Status 09/24/2021 FINAL  Final  Culture, Respiratory w Gram Stain     Status: None   Collection Time: 09/24/21 12:28 PM  Result Value Ref Range Status   Specimen Description   Final    EXPECTORATED SPUTUM Performed at [facility], [address] [place], [place] [address]    Special Requests   Final    NONE Reflexed from [id number] Performed at [facility], [address] [place], [place] [address]    Gram Stain   Final    ABUNDANT WBC PRESENT, PREDOMINANTLY PMN RARE GRAM POSITIVE  COCCI RARE BUDDING YEAST SEEN Performed at [facility], [address] [place], [place] [address]    Culture   Final    FEW CANDIDA GLABRATA RARE METHICILLIN RESISTANT STAPHYLOCOCCUS AUREUS    Report Status 09/28/2021 FINAL  Final   Organism ID, Bacteria METHICILLIN RESISTANT STAPHYLOCOCCUS AUREUS  Final      Susceptibility   Methicillin resistant staphylococcus aureus - MIC*    CIPROFLOXACIN >=8 RESISTANT Resistant     ERYTHROMYCIN >=8 RESISTANT Resistant     GENTAMICIN <=0.5 SENSITIVE Sensitive     OXACILLIN >=4 RESISTANT Resistant     TETRACYCLINE <=1 SENSITIVE Sensitive     VANCOMYCIN 1 SENSITIVE Sensitive     TRIMETH/SULFA <=10 SENSITIVE Sensitive     CLINDAMYCIN <=0.25 SENSITIVE Sensitive     RIFAMPIN <=0.5 SENSITIVE Sensitive     Inducible Clindamycin NEGATIVE Sensitive     * RARE METHICILLIN RESISTANT STAPHYLOCOCCUS AUREUS      Radiology Studies: DG Chest Port 1 View  Result Date: 09/28/2021 CLINICAL DATA:  Pneumonia. EXAM: PORTABLE CHEST 1 VIEW COMPARISON:  Sep 25, 2021. FINDINGS: Mildly improved aeration of the lungs with persistent interstitial and airspace opacities bilaterally. No visible pleural effusion or pneumothorax. Similar mild enlargement the cardiac silhouette. Probable hiatal hernia. IMPRESSION: Mildly improved aeration of the lungs with persistent interstitial and airspace opacities bilaterally. Electronically Signed   By: Feliberto HartsFrederick S Jones M.D.   On: 09/28/2021 12:22     Scheduled Meds:  acetylcysteine  4 mL Nebulization BID   budesonide (PULMICORT) nebulizer solution  0.5 mg Nebulization BID   dextromethorphan-guaiFENesin  1 tablet Oral BID   enoxaparin (LOVENOX) injection  40 mg Subcutaneous Q24H   furosemide  40 mg Intravenous Daily   gabapentin  300 mg Oral TID   ipratropium-albuterol  3 mL Nebulization Q6H   magnesium oxide  400 mg Oral Daily   mouth rinse  15 mL Mouth Rinse BID   mupirocin ointment  1 application. Nasal BID    pantoprazole  40 mg Oral Daily   predniSONE  40 mg Oral Q breakfast   sertraline  150 mg Oral Daily   Continuous Infusions:  sodium chloride Stopped (09/27/21 0015)     LOS: 6 days     Darlin Priestly, MD [facility] If 7PM-7AM, please contact night-coverage 09/29/2021, 8:57 PM

## 2021-09-30 DIAGNOSIS — J154 Pneumonia due to other streptococci: Secondary | ICD-10-CM

## 2021-09-30 DIAGNOSIS — J189 Pneumonia, unspecified organism: Secondary | ICD-10-CM | POA: Diagnosis not present

## 2021-09-30 DIAGNOSIS — N179 Acute kidney failure, unspecified: Secondary | ICD-10-CM

## 2021-09-30 DIAGNOSIS — J810 Acute pulmonary edema: Secondary | ICD-10-CM

## 2021-09-30 DIAGNOSIS — J9621 Acute and chronic respiratory failure with hypoxia: Secondary | ICD-10-CM

## 2021-09-30 DIAGNOSIS — A419 Sepsis, unspecified organism: Secondary | ICD-10-CM | POA: Diagnosis not present

## 2021-09-30 HISTORY — DX: Acute pulmonary edema: J81.0

## 2021-09-30 HISTORY — DX: Pneumonia due to other streptococci: J15.4

## 2021-09-30 HISTORY — DX: Acute kidney failure, unspecified: N17.9

## 2021-09-30 LAB — BASIC METABOLIC PANEL
Anion gap: 6 (ref 5–15)
BUN: 33 mg/dL — ABNORMAL HIGH (ref 8–23)
CO2: 38 mmol/L — ABNORMAL HIGH (ref 22–32)
Calcium: 8.9 mg/dL (ref 8.9–10.3)
Chloride: 95 mmol/L — ABNORMAL LOW (ref 98–111)
Creatinine, Ser: 0.53 mg/dL (ref 0.44–1.00)
GFR, Estimated: >60 mL/min (ref >60)
Glucose, Bld: 91 mg/dL (ref 70–99)
Potassium: 3.9 mmol/L (ref 3.5–5.1)
Sodium: 139 mmol/L (ref 135–145)

## 2021-09-30 LAB — MAGNESIUM: Magnesium: 1.8 mg/dL (ref 1.7–2.4)

## 2021-09-30 LAB — VITAMIN B12: Vitamin B-12: 1489 pg/mL — ABNORMAL HIGH (ref 180–914)

## 2021-09-30 LAB — CBC
HCT: 29.5 % — ABNORMAL LOW (ref 36.0–46.0)
Hemoglobin: 8.8 g/dL — ABNORMAL LOW (ref 12.0–15.0)
MCH: 23.4 pg — ABNORMAL LOW (ref 26.0–34.0)
MCHC: 29.8 g/dL — ABNORMAL LOW (ref 30.0–36.0)
MCV: 78.5 fL — ABNORMAL LOW (ref 80.0–100.0)
Platelets: 173 10*3/uL (ref 150–400)
RBC: 3.76 MIL/uL — ABNORMAL LOW (ref 3.87–5.11)
RDW: 17.2 % — ABNORMAL HIGH (ref 11.5–15.5)
WBC: 10.6 10*3/uL — ABNORMAL HIGH (ref 4.0–10.5)
nRBC: 0 % (ref 0.0–0.2)

## 2021-09-30 LAB — FERRITIN: Ferritin: 80 ng/mL (ref 11–307)

## 2021-09-30 LAB — IRON AND TIBC
Iron: 29 ug/dL (ref 28–170)
Saturation Ratios: 10 % — ABNORMAL LOW (ref 10.4–31.8)
TIBC: 290 ug/dL (ref 250–450)
UIBC: 261 ug/dL

## 2021-09-30 LAB — BRAIN NATRIURETIC PEPTIDE: B Natriuretic Peptide: 91.8 pg/mL (ref 0.0–100.0)

## 2021-09-30 MED ORDER — POLYSACCHARIDE IRON COMPLEX 150 MG PO CAPS
150.0000 mg | ORAL_CAPSULE | Freq: Every day | ORAL | Status: DC
  Administered 2021-10-01 – 2021-10-05 (×5): 150 mg via ORAL
  Filled 2021-09-30 (×6): qty 1

## 2021-09-30 MED ORDER — KETOROLAC TROMETHAMINE 15 MG/ML IJ SOLN
15.0000 mg | Freq: Four times a day (QID) | INTRAMUSCULAR | Status: AC | PRN
  Administered 2021-09-30 – 2021-10-02 (×4): 15 mg via INTRAVENOUS
  Filled 2021-09-30 (×4): qty 1

## 2021-09-30 MED ORDER — ACETAZOLAMIDE ER 500 MG PO CP12
500.0000 mg | ORAL_CAPSULE | Freq: Every day | ORAL | Status: DC
  Administered 2021-09-30 – 2021-10-05 (×6): 500 mg via ORAL
  Filled 2021-09-30 (×6): qty 1

## 2021-09-30 NOTE — Hospital Course (Signed)
Julia [name] is a 61 y.o. female with a known history of COPD, depression, anxiety presents to the emergency department for evaluation of chest pain, SOB.  Patient was in a usual state of health until 2-3 days ago when she has developed worsening chest discomfort, worse with inspiration, refractory to home medications including inhalers and pain meds.  Chest x-ray showed multifocal pneumonia, procalcitonin elevated.  Urine strep pneumo antigen positive.  Patient was placed on IV antibiotics with Rocephin.  Has completed 6 days of antibiotics since 5/30.

## 2021-09-30 NOTE — Progress Notes (Signed)
  Progress Note   Patient: Julia [name] QXI:[id number] DOB: Mar 03, 1961 DOA: 09/23/2021     7 DOS: the patient was seen and examined on 09/30/2021   Brief hospital course: Tilley Faeth is a 61 y.o. female with a known history of COPD, depression, anxiety presents to the emergency department for evaluation of chest pain, SOB.  Patient was in a usual state of health until 2-3 days ago when she has developed worsening chest discomfort, worse with inspiration, refractory to home medications including inhalers and pain meds.  Chest x-ray showed multifocal pneumonia, procalcitonin elevated.  Urine strep pneumo antigen positive.  Patient was placed on IV antibiotics with Rocephin.  Has completed 6 days of antibiotics since 5/30.  Assessment and Plan: Severe sepsis secondary to pneumonia. Acute kidney injury secondary to sepsis. Multifocal pneumonia secondary to strep pneumo. Patient has been evaluated by ID, patient urine antigen was positive for strep pneumo.  Mucus culture came back with MRSA, deemed to be contaminant.  So far has finished antibiotics.  Acute on chronic hypoxemic respiratory failure. COPD exacerbation. Acute pulmonary edema secondary to volume overload. Acute respiratory failure appears to be multifactorial.  Patient has pneumonia, COPD exacerbation, volume overload. She is still requiring 4 L oxygen at daytime, BiPAP at nighttime.  She probably has obstructive sleep apnea.  Will consider overnight oximetry when condition more stable to qualify her for home use BiPAP versus CPAP. Patient volume status much better, BNP has normalized.  Continue IV Lasix.  Patient has some CO2 retention, will change diuretics to dorzolamide 500 mg twice a day. Continue steroids for COPD exacerbation. Continue incentive spirometer.  Acute kidney injury Renal function has improved.        Subjective:  Still requiring 4 L oxygen at daytime, BiPAP and oxygen at night. She has a cough which  is nonproductive.  She has short of breath with exertion.  She does not have paroxysmal nocturnal dyspnea.  Physical Exam: Vitals:   09/29/21 2002 09/29/21 2033 09/30/21 0436 09/30/21 0755  BP:  129/77 127/81 131/73  Pulse:  78 (!) 59 69  Resp:  18    Temp:  98.1 F (36.7 C) 98.2 F (36.8 C) 97.9 F (36.6 C)  TempSrc:      SpO2: 96% 92% 100% 96%  Weight:      Height:       General exam: Appears calm and comfortable  Respiratory system: Decreased breath sounds. Respiratory effort normal. Cardiovascular system: S1 & S2 heard, RRR. No JVD, murmurs, rubs, gallops or clicks. No pedal edema. Gastrointestinal system: Abdomen is nondistended, soft and nontender. No organomegaly or masses felt. Normal bowel sounds heard. Central nervous system: Alert and oriented. No focal neurological deficits. Extremities: Symmetric 5 x 5 power. Skin: No rashes, lesions or ulcers Psychiatry: Judgement and insight appear normal. Mood & affect appropriate.   Data Reviewed:  Reviewed prior echocardiogram results, chest x-ray and all lab results.  Family Communication: Husband updated.  Disposition: Status is: Inpatient Remains inpatient appropriate because: Severity of disease,  Planned Discharge Destination: Home with Home Health    Time spent: 35 minutes  Author: Marrion Coy, MD 09/30/2021 2:43 PM  For on call review [email or web address].

## 2021-10-01 DIAGNOSIS — J154 Pneumonia due to other streptococci: Secondary | ICD-10-CM | POA: Diagnosis not present

## 2021-10-01 DIAGNOSIS — J441 Chronic obstructive pulmonary disease with (acute) exacerbation: Secondary | ICD-10-CM | POA: Diagnosis not present

## 2021-10-01 DIAGNOSIS — J9621 Acute and chronic respiratory failure with hypoxia: Secondary | ICD-10-CM | POA: Diagnosis not present

## 2021-10-01 DIAGNOSIS — J189 Pneumonia, unspecified organism: Secondary | ICD-10-CM | POA: Diagnosis not present

## 2021-10-01 MED ORDER — MELATONIN 5 MG PO TABS
10.0000 mg | ORAL_TABLET | Freq: Once | ORAL | Status: AC
  Administered 2021-10-01: 10 mg via ORAL
  Filled 2021-10-01: qty 2

## 2021-10-01 NOTE — Plan of Care (Signed)
  Problem: Fluid Volume: Goal: Hemodynamic stability will improve Outcome: Progressing   Problem: Clinical Measurements: Goal: Diagnostic test results will improve Outcome: Progressing Goal: Signs and symptoms of infection will decrease Outcome: Progressing   Problem: Respiratory: Goal: Ability to maintain adequate ventilation will improve Outcome: Progressing   Problem: Education: Goal: Knowledge of General Education information will improve Description: Including pain rating scale, medication(s)/side effects and non-pharmacologic comfort measures Outcome: Progressing   Problem: Health Behavior/Discharge Planning: Goal: Ability to manage health-related needs will improve Outcome: Progressing   Problem: Clinical Measurements: Goal: Ability to maintain clinical measurements within normal limits will improve Outcome: Progressing Goal: Will remain free from infection Outcome: Progressing Goal: Diagnostic test results will improve Outcome: Progressing Goal: Respiratory complications will improve Outcome: Progressing Goal: Cardiovascular complication will be avoided Outcome: Progressing   Problem: Safety: Goal: Ability to remain free from injury will improve Outcome: Progressing

## 2021-10-01 NOTE — Evaluation (Signed)
Physical Therapy Evaluation Patient Details Name: Julia [name] MRN: [id number] DOB: 03/13/1961 Today's Date: 10/01/2021  History of Present Illness  Pt is a 61 y/o F admitted on 09/23/21 after presenting to the ED with c/o chest pain & SOB. Pt is being treated for severe sepsis 2/2 PNA, AKI 2/2 sepsis, & multifocal PNA 2/2 strep pneumo. PMH: COPD, depression, anxiety  Clinical Impression  Pt seen for PT evaluation with pt very pleasant & agreeable to tx. Pt reports prior to admission she was only on 2.5L/min via nasal cannula at night, independent without, driving, & denying falls. On this date, pt is independent for bed mobility & transfers & ambulates 2 laps around nurses station without AD & supervision but CGA x 1 occurrence 2/2 scissoring gait. Will continue to follow pt acutely to address high level balance, gait, and stairs without rails.      Recommendations for follow up therapy are one component of a multi-disciplinary discharge planning process, led by the attending physician.  Recommendations may be updated based on patient status, additional functional criteria and insurance authorization.  Follow Up Recommendations No PT follow up    Assistance Recommended at Discharge PRN  Patient can return home with the following  A little help with walking and/or transfers;A little help with bathing/dressing/bathroom;Assistance with cooking/housework;Help with stairs or ramp for entrance    Equipment Recommendations None recommended by PT  Recommendations for Other Services       Functional Status Assessment Patient has had a recent decline in their functional status and demonstrates the ability to make significant improvements in function in a reasonable and predictable amount of time.     Precautions / Restrictions Precautions Precautions: Fall Restrictions Weight Bearing Restrictions: No      Mobility  Bed Mobility Overal bed mobility: Modified Independent                   Transfers Overall transfer level: Modified independent Equipment used: None               General transfer comment: STS without AD    Ambulation/Gait Ambulation/Gait assistance: Supervision Gait Distance (Feet): 350 Feet Assistive device: None Gait Pattern/deviations: Decreased stride length Gait velocity: slightly decreased     General Gait Details: 1 episode of scissoring gait with CGA to correct  Stairs            Wheelchair Mobility    Modified Rankin (Stroke Patients Only)       Balance Overall balance assessment: Needs assistance Sitting-balance support: Feet supported Sitting balance-Leahy Scale: Normal     Standing balance support: During functional activity, No upper extremity supported Standing balance-Leahy Scale: Good                               Pertinent Vitals/Pain Pain Assessment Pain Assessment: No/denies pain    Home Living Family/patient expects to be discharged to:: Private residence Living Arrangements: Spouse/significant other (42 y/o & 5 y/o kids) Available Help at Discharge: Family Type of Home: House Home Access: Stairs to enter Entrance Stairs-Rails: None Entrance Stairs-Number of Steps: 4   Home Layout: One level Home Equipment: None Additional Comments: On home O2 at night (2.5L/min)    Prior Function Prior Level of Function : Independent/Modified Independent;Driving             Mobility Comments: Pt was independent without AD, denies falls in past 6 months, not working, still driving.  Hand Dominance        Extremity/Trunk Assessment   Upper Extremity Assessment Upper Extremity Assessment: Overall WFL for tasks assessed    Lower Extremity Assessment Lower Extremity Assessment: Generalized weakness       Communication   Communication: No difficulties  Cognition Arousal/Alertness: Awake/alert Behavior During Therapy: WFL for tasks assessed/performed Overall Cognitive  Status: Within Functional Limits for tasks assessed                                 General Comments: Very pleasant lady, appreciative of PT session.        General Comments General comments (skin integrity, edema, etc.): Pt on 3L/min via nasal cannula throughout session.    Exercises     Assessment/Plan    PT Assessment Patient needs continued PT services  PT Problem List Cardiopulmonary status limiting activity;Decreased strength;Decreased balance;Decreased mobility       PT Treatment Interventions DME instruction;Therapeutic exercise;Balance training;Gait training;Stair training;Functional mobility training;Patient/family education;Therapeutic activities;Neuromuscular re-education    PT Goals (Current goals can be found in the Care Plan section)  Acute Rehab PT Goals Patient Stated Goal: get better, go home PT Goal Formulation: With patient Time For Goal Achievement: 10/15/21 Potential to Achieve Goals: Good    Frequency Min 2X/week     Co-evaluation               AM-PAC PT "6 Clicks" Mobility  Outcome Measure Help needed turning from your back to your side while in a flat bed without using bedrails?: None Help needed moving from lying on your back to sitting on the side of a flat bed without using bedrails?: None Help needed moving to and from a bed to a chair (including a wheelchair)?: None Help needed standing up from a chair using your arms (e.g., wheelchair or bedside chair)?: None Help needed to walk in hospital room?: A Little Help needed climbing 3-5 steps with a railing? : A Little 6 Click Score: 22    End of Session Equipment Utilized During Treatment: Oxygen Activity Tolerance: Patient tolerated treatment well Patient left: in chair;with call bell/phone within reach Nurse Communication: Mobility status PT Visit Diagnosis: Unsteadiness on feet (R26.81);Muscle weakness (generalized) (M62.81)    Time: [id number] PT Time Calculation  (min) (ACUTE ONLY): 13 min   Charges:   PT Evaluation $PT Eval Low Complexity: [place], PT, DPT 10/01/21, 12:42 PM   Waunita Schooner 10/01/2021, 12:41 PM

## 2021-10-01 NOTE — Progress Notes (Signed)
  Progress Note   Patient: Julia [name] [id number] DOB: [birth year] DOA: 09/23/2021     8 DOS: the patient was seen and examined on 10/01/2021   Brief hospital course: Julia [name] is a 61 y.o. female with a known history of COPD, depression, anxiety presents to the emergency department for evaluation of chest pain, SOB.  Patient was in a usual state of health until 2-3 days ago when she has developed worsening chest discomfort, worse with inspiration, refractory to home medications including inhalers and pain meds.  Chest x-ray showed multifocal pneumonia, procalcitonin elevated.  Urine strep pneumo antigen positive.  Patient was placed on IV antibiotics with Rocephin.  Has completed 6 days of antibiotics since 5/30.  Assessment and Plan: Severe sepsis secondary to pneumonia. Acute kidney injury secondary to sepsis. Multifocal pneumonia secondary to strep pneumo. Patient has been evaluated by ID, patient urine antigen was positive for strep pneumo.  Mucus culture came back with MRSA, deemed to be contaminant.  So far has finished antibiotics.   Acute on chronic respiratory failure with hypoxemia and hypercapnia. COPD exacerbation. Acute pulmonary edema secondary to volume overload. Likely obstructive sleep apnea. Patient condition gradually improving, volume status much better.  Currently on acetazolamide for CO2 retention. Patient walking requirement dropped down to 3 L today, close to baseline.  However, patient still require BiPAP at nighttime, will obtain overnight oximetry, she probably will need BiPAP when going home.  Acute kidney injury Improved.  Recheck BMP tomorrow  Iron deficient anemia. Start oral iron treatment.  B12 level elevated.       Subjective:  Currently on 3 L oxygen, feels much improved.  Still on BiPAP at nighttime. Significant weakness.  Physical Exam: Vitals:   09/30/21 2004 09/30/21 2105 10/01/21 0440 10/01/21 0850  BP:  103/63 109/65 117/71   Pulse:  79 68 63  Resp:  18 16 20   Temp:  (!) 97.5 F (36.4 C) 98 F (36.7 C)   TempSrc:    Oral  SpO2: 95% 94% 100% 97%  Weight:      Height:       General exam: Appears calm and comfortable  Respiratory system: Clear to auscultation. Respiratory effort normal. Cardiovascular system: S1 & S2 heard, RRR. No JVD, murmurs, rubs, gallops or clicks. No pedal edema. Gastrointestinal system: Abdomen is nondistended, soft and nontender. No organomegaly or masses felt. Normal bowel sounds heard. Central nervous system: Alert and oriented. No focal neurological deficits. Extremities: Symmetric 5 x 5 power. Skin: No rashes, lesions or ulcers Psychiatry: Judgement and insight appear normal. Mood & affect appropriate.   Data Reviewed:  No new lab results today  Family Communication: husband updated  Disposition: Status is: Inpatient Remains inpatient appropriate because: Severity of disease.  Planned Discharge Destination: Home with Home Health    Time spent: 35 minutes  Author: Sharen Hones, MD 10/01/2021 11:35 AM  For on call review [email or web address].

## 2021-10-01 NOTE — Plan of Care (Signed)

## 2021-10-01 NOTE — Progress Notes (Signed)
OT Cancellation Note  Patient Details Name: Julia [name] MRN: [id number] DOB: July 19, 1960   Cancelled Treatment:    Reason Eval/Treat Not Completed: OT screened, no needs identified, will sign off (Pt is amb in hallway without AD, reports no deficits in ADL completion. Pt and husband educated on referral to OT if indicated. OT will screen at this time, complete order. Please reconsult if there is a change in functional status) Shanon Payor, OTD OTR/L  10/01/21, 2:01 PM

## 2021-10-01 NOTE — TOC CM/SW Note (Addendum)
Per MD, plan for overnight oximetry to see if she qualifies for CPAP/Bipap. Patient would have to have outpatient sleep study for CPAP. Contacted [employer] representative because per chart review, patient gets her [facility] oxygen through them. Representative said they are not in network with [employer]. Contacted [facility] representative who confirmed they are in network and will review her chart to see if her diagnoses could qualify her for home BIPAP or NIV.  Dayton Scrape, CSW [phone]  3:59 pm: Spoke with patient. She is requesting her CPAP/Bipap orders be sent to charity care through [facility]. Told her that they were not in network with her insurance plan which she just got at the beginning of this year. Waiting on call back from [facility] representative to address. Told her if they cannot do it, will set up through [facility].  Dayton Scrape, [place]

## 2021-10-02 DIAGNOSIS — J441 Chronic obstructive pulmonary disease with (acute) exacerbation: Secondary | ICD-10-CM | POA: Diagnosis not present

## 2021-10-02 DIAGNOSIS — J154 Pneumonia due to other streptococci: Secondary | ICD-10-CM | POA: Diagnosis not present

## 2021-10-02 DIAGNOSIS — J9621 Acute and chronic respiratory failure with hypoxia: Secondary | ICD-10-CM | POA: Diagnosis not present

## 2021-10-02 DIAGNOSIS — J189 Pneumonia, unspecified organism: Secondary | ICD-10-CM | POA: Diagnosis not present

## 2021-10-02 LAB — BASIC METABOLIC PANEL
Anion gap: 5 (ref 5–15)
BUN: 47 mg/dL — ABNORMAL HIGH (ref 8–23)
CO2: 29 mmol/L (ref 22–32)
Calcium: 9.6 mg/dL (ref 8.9–10.3)
Chloride: 105 mmol/L (ref 98–111)
Creatinine, Ser: 0.69 mg/dL (ref 0.44–1.00)
GFR, Estimated: >60 mL/min (ref >60)
Glucose, Bld: 89 mg/dL (ref 70–99)
Potassium: 4.3 mmol/L (ref 3.5–5.1)
Sodium: 139 mmol/L (ref 135–145)

## 2021-10-02 LAB — CBC
HCT: 29 % — ABNORMAL LOW (ref 36.0–46.0)
Hemoglobin: 8.5 g/dL — ABNORMAL LOW (ref 12.0–15.0)
MCH: 23.3 pg — ABNORMAL LOW (ref 26.0–34.0)
MCHC: 29.3 g/dL — ABNORMAL LOW (ref 30.0–36.0)
MCV: 79.5 fL — ABNORMAL LOW (ref 80.0–100.0)
Platelets: 206 10*3/uL (ref 150–400)
RBC: 3.65 MIL/uL — ABNORMAL LOW (ref 3.87–5.11)
RDW: 17.8 % — ABNORMAL HIGH (ref 11.5–15.5)
WBC: 18.6 10*3/uL — ABNORMAL HIGH (ref 4.0–10.5)
nRBC: 0 % (ref 0.0–0.2)

## 2021-10-02 LAB — MAGNESIUM: Magnesium: 2.1 mg/dL (ref 1.7–2.4)

## 2021-10-02 MED ORDER — TRAZODONE HCL 50 MG PO TABS
50.0000 mg | ORAL_TABLET | Freq: Every day | ORAL | Status: DC
  Administered 2021-10-02 – 2021-10-04 (×3): 50 mg via ORAL
  Filled 2021-10-02 (×3): qty 1

## 2021-10-02 MED ORDER — DIPHENHYDRAMINE HCL 25 MG PO CAPS
25.0000 mg | ORAL_CAPSULE | Freq: Once | ORAL | Status: AC
  Administered 2021-10-02: 25 mg via ORAL
  Filled 2021-10-02: qty 1

## 2021-10-02 NOTE — Progress Notes (Signed)
  Progress Note   Patient: Julia [name] [id number] DOB: Nov 24, 1960 DOA: 09/23/2021     9 DOS: the patient was seen and examined on 10/02/2021   Brief hospital course: Dejon Jungman is a 61 y.o. female with a known history of COPD, depression, anxiety presents to the emergency department for evaluation of chest pain, SOB.  Patient was in a usual state of health until 2-3 days ago when she has developed worsening chest discomfort, worse with inspiration, refractory to home medications including inhalers and pain meds.  Chest x-ray showed multifocal pneumonia, procalcitonin elevated.  Urine strep pneumo antigen positive.  Patient was placed on IV antibiotics with Rocephin.  Has completed 6 days of antibiotics since 5/30.  Assessment and Plan: Severe sepsis secondary to pneumonia. Acute kidney injury secondary to sepsis. Multifocal pneumonia secondary to strep pneumo. Patient has been evaluated by ID, patient urine antigen was positive for strep pneumo.  Mucus culture came back with MRSA, deemed to be contaminant.  So far has finished antibiotics.   Acute on chronic respiratory failure with hypoxemia and hypercapnia. COPD exacerbation. Acute pulmonary edema secondary to volume overload. Obstructive sleep apnea. Ruled in Condition has improved, oxygenation back to baseline.  However, she still requiring BiPAP at nighttime.  Overnight oximetry showed significant hypoxemia even on oxygen, she will need a trilogy/BiPAP at discharge.  Discussed with social worker, patient will probably go home on Monday when this is set up.  Otherwise patient has been stabilized.   Acute kidney injury Condition improved.  Iron deficient anemia. Continue oral iron treatment.  B12 level elevated.         Subjective:  Patient doing well today, oxygenation back to baseline.  Still has short of breath with exertion which is baseline.  No cough.  Physical Exam: Vitals:   10/01/21 2038 10/01/21 2043  10/02/21 0505 10/02/21 0750  BP: 115/67  136/81 121/75  Pulse: 72  68 65  Resp: 18  18 (!) 22  Temp: 97.7 F (36.5 C)  98 F (36.7 C) 97.6 F (36.4 C)  TempSrc:      SpO2: 97% 97% 100% 100%  Weight:      Height:       General exam: Appears calm and comfortable  Respiratory system: Decreased breath sounds. Respiratory effort normal. Cardiovascular system: S1 & S2 heard, RRR. No JVD, murmurs, rubs, gallops or clicks. No pedal edema. Gastrointestinal system: Abdomen is nondistended, soft and nontender. No organomegaly or masses felt. Normal bowel sounds heard. Central nervous system: Alert and oriented. No focal neurological deficits. Extremities: Symmetric 5 x 5 power. Skin: No rashes, lesions or ulcers Psychiatry: Judgement and insight appear normal. Mood & affect appropriate.   Data Reviewed:  Labs reviewed.  Family Communication: Husband updated at bedside.  Disposition: Status is: Inpatient Remains inpatient appropriate because: Unsafe discharge.  Planned Discharge Destination: Home    Time spent: 25 minutes  Author: Marrion Coy, MD 10/02/2021 12:09 PM  For on call review [email or web address].

## 2021-10-02 NOTE — TOC CM/SW Note (Addendum)
Chronic Hypoxic resp. failure secondary to COPD.  Bi-level PAP has failed to reduce CO2 effectively.  Ordering a a Non-invasive ventilator to effectively reduce CO2, thus subsequently reducing hospitalizations and sustaining life.

## 2021-10-02 NOTE — Progress Notes (Signed)
Verified with Infection Prevention, Guadelupe Sabin, that the pt only needs to be on Contact Isolation the 09/24/21 expectorated sputum + for rare MRSA and staph. aureus

## 2021-10-02 NOTE — TOC Progression Note (Signed)
Transition of Care [facility]) - Progression Note    Patient Details  Name: Julia [name] MRN: [id number] Date of Birth: Nov 29, 1960  Transition of Care [facility] CM/SW Contact  Margarito Liner, LCSW Phone Number: 10/02/2021, 9:38 AM  Clinical Narrative:  Confirmed with [employer] [facility] representative that patient cannot get her bipap/NIV through them. He also recommended that patient switch her home oxygen to an in-network provider. Sent overnight oximetry results to [facility] representative for review.   Expected Discharge Plan and Services                                                 Social Determinants of Health (SDOH) Interventions    Readmission Risk Interventions     View : No data to display.

## 2021-10-03 DIAGNOSIS — J9621 Acute and chronic respiratory failure with hypoxia: Secondary | ICD-10-CM | POA: Diagnosis not present

## 2021-10-03 DIAGNOSIS — A419 Sepsis, unspecified organism: Secondary | ICD-10-CM | POA: Diagnosis not present

## 2021-10-03 DIAGNOSIS — J441 Chronic obstructive pulmonary disease with (acute) exacerbation: Secondary | ICD-10-CM | POA: Diagnosis not present

## 2021-10-03 DIAGNOSIS — J189 Pneumonia, unspecified organism: Secondary | ICD-10-CM | POA: Diagnosis not present

## 2021-10-03 MED ORDER — PREDNISONE 20 MG PO TABS
20.0000 mg | ORAL_TABLET | Freq: Every day | ORAL | Status: DC
  Administered 2021-10-04 – 2021-10-05 (×2): 20 mg via ORAL
  Filled 2021-10-03 (×2): qty 1

## 2021-10-03 NOTE — Progress Notes (Signed)
  Progress Note   Patient: Julia [name] [id number] DOB: 1960-05-23 DOA: 09/23/2021     10 DOS: the patient was seen and examined on 10/03/2021   Brief hospital course: Julia [name] is a 61 y.o. female with a known history of COPD, depression, anxiety presents to the emergency department for evaluation of chest pain, SOB.  Patient was in a usual state of health until 2-3 days ago when she has developed worsening chest discomfort, worse with inspiration, refractory to home medications including inhalers and pain meds.  Chest x-ray showed multifocal pneumonia, procalcitonin elevated.  Urine strep pneumo antigen positive.  Patient was placed on IV antibiotics with Rocephin.  Has completed 6 days of antibiotics since 5/30.  Assessment and Plan: Severe sepsis secondary to pneumonia. Acute kidney injury secondary to sepsis. Multifocal pneumonia secondary to strep pneumo. Patient has been evaluated by ID, patient urine antigen was positive for strep pneumo.  Mucus culture came back with MRSA, deemed to be contaminant.  So far has finished antibiotics.   Acute on chronic respiratory failure with hypoxemia and hypercapnia. COPD exacerbation. Acute pulmonary edema secondary to volume overload. Obstructive sleep apnea. Ruled in Patient condition has improved, oxygenation back to baseline.  Currently pending trilogy set up before discharge. Patient has leukocytosis secondary to steroids, I will decrease prednisone to 20 mg daily.    Acute kidney injury Condition improved.  Iron deficient anemia. Continue oral iron treatment.  B12 level elevated.        Subjective:  Patient condition back to baseline, currently pending availability of trilogy before discharge.  Physical Exam: Vitals:   10/02/21 2049 10/02/21 2204 10/03/21 0440 10/03/21 0853  BP:  124/74 (!) 96/55 110/61  Pulse:  82 72 71  Resp:  18 16 20   Temp:  (!) 97.5 F (36.4 C) 98.5 F (36.9 C) 97.7 F (36.5 C)  TempSrc:   Oral  Oral  SpO2: 98% 100% 97% 96%  Weight:      Height:       General exam: Appears calm and comfortable  Respiratory system: Decreased breath sounds. Respiratory effort normal. Cardiovascular system: S1 & S2 heard, RRR. No JVD, murmurs, rubs, gallops or clicks. No pedal edema. Gastrointestinal system: Abdomen is nondistended, soft and nontender. No organomegaly or masses felt. Normal bowel sounds heard. Central nervous system: Alert and oriented. No focal neurological deficits. Extremities: Symmetric 5 x 5 power. Skin: No rashes, lesions or ulcers Psychiatry: Judgement and insight appear normal. Mood & affect appropriate.   Data Reviewed:  No new labs today.  Family Communication: None  Disposition: Status is: Inpatient Remains inpatient appropriate because: Unsafe discharge.  Planned Discharge Destination: Home with Home Health    Time spent: 29 minutes  Author: , MD 10/03/2021 11:48 AM  For on call review [email or web address].

## 2021-10-03 NOTE — Progress Notes (Signed)
Physical Therapy Treatment Patient Details Name: Julia [name] MRN: [id number] DOB: [birth year] Today's Date: 10/03/2021   History of Present Illness Pt is a 61 y/o F admitted on 09/23/21 after presenting to the ED with c/o chest pain & SOB. Pt is being treated for severe sepsis 2/2 PNA, AKI 2/2 sepsis, & multifocal PNA 2/2 strep pneumo. PMH: COPD, depression, anxiety    PT Comments    Pt was easily and safely able to exit bed, stand, and ambulate without physical assistance. PT to sign off. No PT needs identified. Cleared from a PT standpoint to DC home once all equipment is set up for home use.   Recommendations for follow up therapy are one component of a multi-disciplinary discharge planning process, led by the attending physician.  Recommendations may be updated based on patient status, additional functional criteria and insurance authorization.  Follow Up Recommendations  No PT follow up     Assistance Recommended at Discharge PRN  Patient can return home with the following A little help with walking and/or transfers;A little help with bathing/dressing/bathroom;Assistance with cooking/housework;Help with stairs or ramp for entrance   Equipment Recommendations  None recommended by PT       Precautions / Restrictions Precautions Precautions: Fall Restrictions Weight Bearing Restrictions: No     Mobility  Bed Mobility Overal bed mobility: Modified Independent   Transfers Overall transfer level: Modified independent   Ambulation/Gait Ambulation/Gait assistance: Supervision Gait Distance (Feet): 400 Feet Assistive device: None Gait Pattern/deviations: WFL(Within Functional Limits) Gait velocity: WNL     General Gait Details: no LOB or unsteadiness today with gait training     Balance Overall balance assessment: Modified Independent      Cognition Arousal/Alertness: Awake/alert Behavior During Therapy: WFL for tasks assessed/performed Overall Cognitive Status:  Within Functional Limits for tasks assessed      General Comments: Pt A and O x 4               Pertinent Vitals/Pain Pain Assessment Pain Assessment: No/denies pain     PT Goals (current goals can now be found in the care plan section) Acute Rehab PT Goals Patient Stated Goal: Get better, go home Progress towards PT goals: Progressing toward goals    Frequency    Other (Comment)      PT Plan Frequency needs to be updated;Other (comment) (DC from PT due to no needs identified)       AM-PAC PT "6 Clicks" Mobility   Outcome Measure  Help needed turning from your back to your side while in a flat bed without using bedrails?: None Help needed moving from lying on your back to sitting on the side of a flat bed without using bedrails?: None Help needed moving to and from a bed to a chair (including a wheelchair)?: None Help needed standing up from a chair using your arms (e.g., wheelchair or bedside chair)?: None Help needed to walk in hospital room?: None Help needed climbing 3-5 steps with a railing? : A Little 6 Click Score: 23    End of Session Equipment Utilized During Treatment: Oxygen Activity Tolerance: Patient tolerated treatment well Patient left: in chair;with call bell/phone within reach Nurse Communication: Mobility status PT Visit Diagnosis: Unsteadiness on feet (R26.81);Muscle weakness (generalized) (M62.81)     Time: 2563-8937 PT Time Calculation (min) (ACUTE ONLY): 15 min  Charges:  $Gait Training: 8-22 mins  Jetta Lout PTA 10/03/21, 1:12 PM

## 2021-10-03 NOTE — Progress Notes (Signed)
Patient off unit via wheelchair with her husband.

## 2021-10-04 DIAGNOSIS — J189 Pneumonia, unspecified organism: Secondary | ICD-10-CM | POA: Diagnosis not present

## 2021-10-04 DIAGNOSIS — J810 Acute pulmonary edema: Secondary | ICD-10-CM | POA: Diagnosis not present

## 2021-10-04 DIAGNOSIS — J441 Chronic obstructive pulmonary disease with (acute) exacerbation: Secondary | ICD-10-CM | POA: Diagnosis not present

## 2021-10-04 DIAGNOSIS — J9621 Acute and chronic respiratory failure with hypoxia: Secondary | ICD-10-CM | POA: Diagnosis not present

## 2021-10-04 NOTE — Progress Notes (Signed)
  Progress Note   Patient: Julia [name] [id number] DOB: 01/18/1961 DOA: 09/23/2021     11 DOS: the patient was seen and examined on 10/04/2021   Brief hospital course: Sanju Senko is a 61 y.o. female with a known history of COPD, depression, anxiety presents to the emergency department for evaluation of chest pain, SOB.  Patient was in a usual state of health until 2-3 days ago when she has developed worsening chest discomfort, worse with inspiration, refractory to home medications including inhalers and pain meds.  Chest x-ray showed multifocal pneumonia, procalcitonin elevated.  Urine strep pneumo antigen positive.  Patient was placed on IV antibiotics with Rocephin.  Has completed 6 days of antibiotics since 5/30.  Assessment and Plan: Severe sepsis secondary to pneumonia. Acute kidney injury secondary to sepsis. Multifocal pneumonia secondary to strep pneumo. Patient has been evaluated by ID, patient urine antigen was positive for strep pneumo.  Mucus culture came back with MRSA, deemed to be contaminant.  So far has finished antibiotics.   Acute on chronic respiratory failure with hypoxemia and hypercapnia. COPD exacerbation. Acute pulmonary edema secondary to volume overload. Obstructive sleep apnea. Ruled in Condition has back to baseline.  Currently pending for trilogy.   Acute kidney injury Renal function normalized.  Iron deficient anemia. We will continue oral iron supplement.       Subjective:  Slept well last night on BiPAP.  Short of breath back to baseline.  Physical Exam: Vitals:   10/03/21 2132 10/03/21 2235 10/04/21 0609 10/04/21 0822  BP: 104/72  105/60 113/76  Pulse: 71  64 67  Resp: 18  17 20   Temp: (!) 97.5 F (36.4 C)   97.9 F (36.6 C)  TempSrc: Oral     SpO2: 100% 98% 98% 95%  Weight:      Height:       General exam: Appears calm and comfortable  Respiratory system: Decreased breath sounds. Respiratory effort normal. Cardiovascular  system: S1 & S2 heard, RRR. No JVD, murmurs, rubs, gallops or clicks. No pedal edema. Gastrointestinal system: Abdomen is nondistended, soft and nontender. No organomegaly or masses felt. Normal bowel sounds heard. Central nervous system: Alert and oriented. No focal neurological deficits. Extremities: Symmetric 5 x 5 power. Skin: No rashes, lesions or ulcers Psychiatry: Judgement and insight appear normal. Mood & affect appropriate.   Data Reviewed:  No new labs  Family Communication:   Disposition: Status is: Inpatient Remains inpatient appropriate because: Unsafe discharge  Planned Discharge Destination: Home    Time spent: 28 minutes  Author: Sharen Hones, MD 10/04/2021 2:29 PM  For on call review [email or web address].

## 2021-10-05 DIAGNOSIS — J189 Pneumonia, unspecified organism: Secondary | ICD-10-CM | POA: Diagnosis not present

## 2021-10-05 DIAGNOSIS — J810 Acute pulmonary edema: Secondary | ICD-10-CM | POA: Diagnosis not present

## 2021-10-05 DIAGNOSIS — J154 Pneumonia due to other streptococci: Secondary | ICD-10-CM | POA: Diagnosis not present

## 2021-10-05 DIAGNOSIS — J9621 Acute and chronic respiratory failure with hypoxia: Secondary | ICD-10-CM | POA: Diagnosis not present

## 2021-10-05 MED ORDER — FERROUS SULFATE 325 (65 FE) MG PO TABS: 325.0000 mg | ORAL_TABLET | Freq: Every day | ORAL | 0 refills | Status: DC

## 2021-10-05 MED ORDER — PREDNISONE 20 MG PO TABS: 10.0000 mg | ORAL_TABLET | Freq: Every day | ORAL | 0 refills | Status: AC

## 2021-10-05 NOTE — Discharge Summary (Signed)
Physician Discharge Summary   Patient: Julia [name] MRN: [id number] DOB: [birth year]  Admit date:     09/23/2021  Discharge date: 10/05/21  Discharge Physician: Sharen Hones   PCP: Romualdo Bolk, FNP   Recommendations at discharge:   Follow-up with PCP in 1 week  Discharge Diagnoses: Principal Problem:   Sepsis due to pneumonia [facility]) Active Problems:   Lung nodule   Severe sepsis ([place])   COPD with acute exacerbation (HCC)   Acute on chronic respiratory failure with hypoxia (HCC)   Pneumonia due to Streptococcus ([place])   Acute pulmonary edema (HCC)   AKI (acute kidney injury) ([place])  Resolved Problems:   * No resolved hospital problems. *  Hospital Course: Julia [name] is a 61 y.o. female with a known history of COPD, depression, anxiety presents to the emergency department for evaluation of chest pain, SOB.  Patient was in a usual state of health until 2-3 days ago when she has developed worsening chest discomfort, worse with inspiration, refractory to home medications including inhalers and pain meds.  Chest x-ray showed multifocal pneumonia, procalcitonin elevated.  Urine strep pneumo antigen positive.  Patient was placed on IV antibiotics with Rocephin.  Has completed 6 days of antibiotics since 5/30.  Assessment and Plan: Severe sepsis secondary to pneumonia. Acute kidney injury secondary to sepsis. Multifocal pneumonia secondary to strep pneumo. Patient has been evaluated by ID, patient urine antigen was positive for strep pneumo.  Mucus culture came back with MRSA, deemed to be contaminant.  So far has finished antibiotics.   Acute on chronic respiratory failure with hypoxemia and hypercapnia. COPD exacerbation. Acute pulmonary edema secondary to volume overload. Obstructive sleep apnea. Ruled in We will set up home oxygen and trilogy before discharge. Condition improved.  Acute kidney injury Renal function normalized.  Iron deficient anemia. We will  continue oral iron supplement.       Consultants: None Procedures performed: None  Disposition: Home Diet recommendation:  Discharge Diet Orders (From admission, onward)     Start     Ordered   10/05/21 0000  Diet - low sodium heart healthy        10/05/21 0902           Cardiac diet DISCHARGE MEDICATION: Allergies as of 10/05/2021       Reactions   Banana Nausea And Vomiting        Medication List     STOP taking these medications    predniSONE 10 MG (21) Tbpk tablet Commonly known as: STERAPRED UNI-PAK 21 TAB Replaced by: predniSONE 20 MG tablet       TAKE these medications    acetaminophen 500 MG tablet Commonly known as: TYLENOL Take 1,000 mg by mouth every 6 (six) hours as needed.   albuterol (2.5 MG/3ML) 0.083% nebulizer solution Commonly known as: PROVENTIL Take 2.5 mg by nebulization every 6 (six) hours as needed for wheezing or shortness of breath.   albuterol 108 (90 Base) MCG/ACT inhaler Commonly known as: VENTOLIN HFA Inhale 2 puffs into the lungs every 6 (six) hours as needed for wheezing or shortness of breath.   ALPRAZolam 0.5 MG tablet Commonly known as: XANAX Take 0.5-1 mg by mouth 2 (two) times daily as needed for anxiety.   dextromethorphan-guaiFENesin 30-600 MG 12hr tablet Commonly known as: MUCINEX DM Take 1 tablet by mouth 2 (two) times daily.   docusate sodium 100 MG capsule Commonly known as: COLACE Take 100 mg by mouth daily as needed for mild  constipation.   ferrous sulfate 325 (65 FE) MG tablet Take 1 tablet (325 mg total) by mouth daily.   gabapentin 300 MG capsule Commonly known as: NEURONTIN Take 300 mg by mouth 3 (three) times daily.   guaiFENesin 100 MG/5ML liquid Commonly known as: ROBITUSSIN Take 5 mLs by mouth every 4 (four) hours as needed for cough or to loosen phlegm.   magnesium oxide 400 MG tablet Commonly known as: MAG-OX Take 400 mg by mouth daily.   pantoprazole 40 MG tablet Commonly known  as: PROTONIX Take 40 mg by mouth daily.   predniSONE 20 MG tablet Commonly known as: DELTASONE Take 0.5 tablets (10 mg total) by mouth daily with breakfast for 4 days. Replaces: predniSONE 10 MG (21) Tbpk tablet   sertraline 100 MG tablet Commonly known as: ZOLOFT Take 150 mg by mouth daily.   Trelegy Ellipta 100-62.5-25 MCG/ACT Aepb Generic drug: Fluticasone-Umeclidin-Vilant Inhale 1 puff into the lungs daily.               Durable Medical Equipment  (From admission, onward)           Start     Ordered   10/05/21 0902  For home use only DME oxygen  Once       Question Answer Comment  Length of Need Lifetime   Mode or (Route) Nasal cannula   Liters per Minute 2   Frequency Continuous (stationary and portable oxygen unit needed)   Oxygen conserving device Yes   Oxygen delivery system Gas      10/05/21 0901            Follow-up Information     Romualdo Bolk, FNP Follow up in 1 week(s).   Specialty: Nurse Practitioner Contact information: [address] [place] [address] [phone]                Discharge Exam: [place] Weights   09/23/21 2025  Weight: 57.4 kg   General exam: Appears calm and comfortable  Respiratory system: Decreased breath sounds. Respiratory effort normal. Cardiovascular system: S1 & S2 heard, RRR. No JVD, murmurs, rubs, gallops or clicks. No pedal edema. Gastrointestinal system: Abdomen is nondistended, soft and nontender. No organomegaly or masses felt. Normal bowel sounds heard. Central nervous system: Alert and oriented. No focal neurological deficits. Extremities: Symmetric 5 x 5 power. Skin: No rashes, lesions or ulcers Psychiatry: Judgement and insight appear normal. Mood & affect appropriate.    Condition at discharge: good  The results of significant diagnostics from this hospitalization (including imaging, microbiology, ancillary and laboratory) are listed below for reference.   Imaging Studies: CT  Angio Chest PE W and/or Wo Contrast  Result Date: 09/23/2021 CLINICAL DATA:  Pulmonary embolus suspected EXAM: CT ANGIOGRAPHY CHEST WITH CONTRAST TECHNIQUE: Multidetector CT imaging of the chest was performed using the standard protocol during bolus administration of intravenous contrast. Multiplanar CT image reconstructions and MIPs were obtained to evaluate the vascular anatomy. RADIATION DOSE REDUCTION: This exam was performed according to the departmental dose-optimization program which includes automated exposure control, adjustment of the mA and/or kV according to patient size and/or use of iterative reconstruction technique. CONTRAST:  16mL OMNIPAQUE IOHEXOL 350 MG/ML SOLN COMPARISON:  Chest CT dated July 19, 2021 FINDINGS: Cardiovascular: Adequate contrast opacification of the pulmonary arteries. No evidence of pulmonary embolus. Heart is upper limits of normal in size. No pericardial effusion. Left main and three-vessel coronary artery calcifications. Atherosclerotic disease of the thoracic aorta. Mediastinum/Nodes: Large hiatal hernia.  Mediastinal and bilateral hilar adenopathy. Mediastinal adenopathy is similar when compared with prior exam. Reference right lower paratracheal lymph node measuring 1.7 cm in short axis on series 6, image 176. New bilateral hilar lymphadenopathy. Lungs/Pleura: Centrilobular emphysema. Complete occlusion of the right lower lobe bronchus with near complete collapse of the right lower lobe. New patchy bilateral consolidations. Bilateral small solid pulmonary nodules are similar to recent prior exam. Upper Abdomen: No acute abnormality. Musculoskeletal: Unchanged T1 compression fracture and sternal fracture. No acute osseous abnormality. Review of the MIP images confirms the above findings. IMPRESSION: 1. No evidence of pulmonary embolus. 2. New bilateral patchy consolidations, concerning for multifocal pneumonia. 3. Near complete collapse of the right lower lobe and  occlusion of the right lower lobe bronchus, likely due to aspiration. 4. Increased size of hilar lymph nodes, likely reactive. 5. Bilateral small solid pulmonary nodules are similar to recent prior exam and likely due to background chronic atypical infection. 6. Aortic Atherosclerosis (ICD10-I70.0) and Emphysema (ICD10-J43.9). Electronically Signed   By: Allegra Lai M.D.   On: 09/23/2021 18:30   DG Chest Port 1 View  Result Date: 09/28/2021 CLINICAL DATA:  Pneumonia. EXAM: PORTABLE CHEST 1 VIEW COMPARISON:  Sep 25, 2021. FINDINGS: Mildly improved aeration of the lungs with persistent interstitial and airspace opacities bilaterally. No visible pleural effusion or pneumothorax. Similar mild enlargement the cardiac silhouette. Probable hiatal hernia. IMPRESSION: Mildly improved aeration of the lungs with persistent interstitial and airspace opacities bilaterally. Electronically Signed   By: Feliberto Harts M.D.   On: 09/28/2021 12:22   DG Chest Port 1 View  Result Date: 09/25/2021 CLINICAL DATA:  Hypoxia, chest pain EXAM: PORTABLE CHEST 1 VIEW COMPARISON:  Prior chest x-ray 09/24/2021 FINDINGS: Significant interval progression of diffuse bilateral interstitial and airspace opacities with areas of greater confluence in the right lower lobe, right mid lung and periphery of the left lung. This is superimposed on a background of advanced bronchitic changes and interstitial prominence. No pneumothorax. No large effusion. Stable cardiac size. Atherosclerotic calcifications are present. IMPRESSION: Progressive bilateral interstitial and airspace opacities compared to 09/23/2021 concerning for worsening multifocal pneumonia. Electronically Signed   By: Malachy Moan M.D.   On: 09/25/2021 15:11   DG Chest Port 1 View  Result Date: 09/23/2021 CLINICAL DATA:  Questionable sepsis - evaluate for abnormality EXAM: PORTABLE CHEST 1 VIEW COMPARISON:  July 19, 2021. FINDINGS: New consolidation in the left  midlung. Additional reticulonodular opacities are similar. New probable right lower lobe collapse. No definite pleural effusions or pneumothorax. Cardiomediastinal silhouette is similar. Polyarticular degenerative change. IMPRESSION: 1. New consolidation in the left midlung , concerning for pneumonia. Followup PA and lateral chest X-ray is recommended in 3-4 weeks following trial of antibiotic therapy to ensure resolution and exclude underlying malignancy. 2. New probable right lower lobe collapse. 3. 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 Electronically Signed   By: Feliberto Harts M.D.   On: 09/23/2021 14:41    Microbiology: Results for orders placed or performed during the hospital encounter of 09/23/21  Blood Culture (routine x 2)     Status: None   Collection Time: 09/23/21  2:24 PM   Specimen: BLOOD  Result Value Ref Range Status   Specimen Description BLOOD RIGHT ANTECUBITAL  Final   Special Requests   Final    BOTTLES DRAWN AEROBIC AND ANAEROBIC Blood Culture results may not be optimal due to an inadequate volume of blood received  in culture bottles   Culture   Final    NO GROWTH 5 DAYS Performed at [facility], [address] [place], [place] [address]    Report Status 09/28/2021 FINAL  Final  MRSA Next Gen by PCR, Nasal     Status: Abnormal   Collection Time: 09/23/21  8:20 PM   Specimen: Nasal Mucosa; Nasal Swab  Result Value Ref Range Status   MRSA by PCR Next Gen DETECTED (A) NOT DETECTED Final    Comment: RESULT CALLED TO, READ BACK BY AND VERIFIED WITH: Florentina Jenny [facility] 09/23/21 2207 MU (NOTE) The GeneXpert MRSA Assay (FDA approved for NASAL specimens only), is one component of a comprehensive MRSA colonization surveillance program. It is not intended to diagnose MRSA infection nor to guide or monitor treatment for MRSA infections. Test performance is not FDA  approved in patients less than 17 years old. Performed at [facility], [address] [place], [place] [address]   Culture, blood (Routine X 2) w Reflex to ID Panel     Status: None   Collection Time: 09/23/21  9:20 PM   Specimen: BLOOD  Result Value Ref Range Status   Specimen Description BLOOD RIGHT HAND  Final   Special Requests   Final    BOTTLES DRAWN AEROBIC AND ANAEROBIC Blood Culture adequate volume   Culture   Final    NO GROWTH 5 DAYS Performed at [facility], [address] [place], [place] [address]    Report Status 09/28/2021 FINAL  Final  Respiratory (~20 pathogens) panel by PCR     Status: None   Collection Time: 09/23/21  9:33 PM   Specimen: Nasopharyngeal Swab; Respiratory  Result Value Ref Range Status   Adenovirus NOT DETECTED NOT DETECTED Final   Coronavirus 229E NOT DETECTED NOT DETECTED Final    Comment: (NOTE) The Coronavirus on the Respiratory Panel, DOES NOT test for the novel  Coronavirus (2019 nCoV)    Coronavirus HKU1 NOT DETECTED NOT DETECTED Final   Coronavirus NL63 NOT DETECTED NOT DETECTED Final   Coronavirus OC43 NOT DETECTED NOT DETECTED Final   Metapneumovirus NOT DETECTED NOT DETECTED Final   Rhinovirus / Enterovirus NOT DETECTED NOT DETECTED Final   Influenza A NOT DETECTED NOT DETECTED Final   Influenza B NOT DETECTED NOT DETECTED Final   Parainfluenza Virus 1 NOT DETECTED NOT DETECTED Final   Parainfluenza Virus 2 NOT DETECTED NOT DETECTED Final   Parainfluenza Virus 3 NOT DETECTED NOT DETECTED Final   Parainfluenza Virus 4 NOT DETECTED NOT DETECTED Final   Respiratory Syncytial Virus NOT DETECTED NOT DETECTED Final   Bordetella pertussis NOT DETECTED NOT DETECTED Final   Bordetella Parapertussis NOT DETECTED NOT DETECTED Final   Chlamydophila pneumoniae NOT DETECTED NOT DETECTED Final   Mycoplasma pneumoniae NOT DETECTED NOT DETECTED Final    Comment: Performed at [facility], [place]. [address]  [place], [place] [address]  SARS Coronavirus 2 by RT PCR (hospital order, performed in [facility] hospital lab) *cepheid single result test* Anterior Nasal Swab     Status: None   Collection Time: 09/24/21 11:48 AM   Specimen: Anterior Nasal Swab  Result Value Ref Range Status   SARS Coronavirus 2 by RT PCR NEGATIVE NEGATIVE Final    Comment: (NOTE) SARS-CoV-2 target nucleic acids are NOT DETECTED.  The SARS-CoV-2 RNA is generally detectable in upper and lower respiratory specimens during the acute phase of infection. The lowest concentration of SARS-CoV-2 viral copies this assay  can detect is 250 copies / mL. A negative result does not preclude SARS-CoV-2 infection and should not be used as the sole basis for treatment or other patient management decisions.  A negative result may occur with improper specimen collection / handling, submission of specimen other than nasopharyngeal swab, presence of viral mutation(s) within the areas targeted by this assay, and inadequate number of viral copies (<250 copies / mL). A negative result must be combined with clinical observations, patient history, and epidemiological information.  Fact Sheet for Patients:   https://www.patel.info/  Fact Sheet for Healthcare Providers: https://hall.com/  This test is not yet approved or  cleared by the [place] FDA and has been authorized for detection and/or diagnosis of SARS-CoV-2 by FDA under an Emergency Use Authorization (EUA).  This EUA will remain in effect (meaning this test can be used) for the duration of the COVID-19 declaration under Section 564(b)(1) of the Act, 21 U.S.C. section 360bbb-3(b)(1), unless the authorization is terminated or revoked sooner.  Performed at [facility], [address] [place], [place] [address]   Expectorated Sputum Assessment w Gram Stain, Rflx to Resp Cult     Status: None   Collection Time: 09/24/21 12:28 PM    Specimen: Expectorated Sputum  Result Value Ref Range Status   Specimen Description EXPECTORATED SPUTUM  Final   Special Requests NONE  Final   Sputum evaluation   Final    THIS SPECIMEN IS ACCEPTABLE FOR SPUTUM CULTURE Performed at [facility], [address], [place], [place] [address]    Report Status 09/24/2021 FINAL  Final  Culture, Respiratory w Gram Stain     Status: None   Collection Time: 09/24/21 12:28 PM  Result Value Ref Range Status   Specimen Description   Final    EXPECTORATED SPUTUM Performed at [facility], [address], [place], [place] [address]    Special Requests   Final    NONE Reflexed from [id number] Performed at [facility], [address] [place], [place] [address]    Gram Stain   Final    ABUNDANT WBC PRESENT, PREDOMINANTLY PMN RARE GRAM POSITIVE COCCI RARE BUDDING YEAST SEEN Performed at [facility], [place] [address], [place],  [address]    Culture   Final    FEW CANDIDA GLABRATA RARE METHICILLIN RESISTANT STAPHYLOCOCCUS AUREUS    Report Status 09/28/2021 FINAL  Final   Organism ID, Bacteria METHICILLIN RESISTANT STAPHYLOCOCCUS AUREUS  Final      Susceptibility   Methicillin resistant staphylococcus aureus - MIC*    CIPROFLOXACIN >=8 RESISTANT Resistant     ERYTHROMYCIN >=8 RESISTANT Resistant     GENTAMICIN <=0.5 SENSITIVE Sensitive     OXACILLIN >=4 RESISTANT Resistant     TETRACYCLINE <=1 SENSITIVE Sensitive     VANCOMYCIN 1 SENSITIVE Sensitive     TRIMETH/SULFA <=10 SENSITIVE Sensitive     CLINDAMYCIN <=0.25 SENSITIVE Sensitive     RIFAMPIN <=0.5 SENSITIVE Sensitive     Inducible Clindamycin NEGATIVE Sensitive     * RARE METHICILLIN RESISTANT STAPHYLOCOCCUS AUREUS    Labs: CBC: Recent Labs  Lab 09/30/21 0439 10/02/21 0639  WBC 10.6* 18.6*  HGB 8.8* 8.5*  HCT 29.5* 29.0*  MCV 78.5* 79.5*  PLT 173 [id number]   Basic Metabolic Panel: Recent Labs  Lab 09/30/21 0439 10/02/21 0639  NA 139  139  K 3.9 4.3  CL 95* 105  CO2 38* 29  GLUCOSE 91 89  BUN 33* 47*  CREATININE 0.53 0.69  CALCIUM 8.9 9.6  MG 1.8 2.1   Liver Function Tests: No results for input(s): AST, ALT, ALKPHOS, BILITOT, PROT, ALBUMIN in the last 168 hours. CBG: No results for input(s): GLUCAP in the last 168 hours.  Discharge time spent: greater than 30 minutes.  Signed: Sharen Hones, MD [facility] 10/05/2021

## 2021-10-05 NOTE — TOC Progression Note (Signed)
Transition of Care [facility]) - Progression Note    Patient Details  Name: Julia [name] MRN: [id number] Date of Birth: 1961/03/20  Transition of Care [facility]) CM/SW Contact  Caryn Section, RN Phone Number: 10/05/2021, 1:03 PM  Clinical Narrative:   [name] did not deliver oxygen, just NIV for transport.  Patient required to transport home with oxygen .  [name] at [facility] aware, he was not able to call prior to delivery but states they will deliver oxygen to hospital room for transport home.  [name] Manager will contact patient to make arrangements.         Expected Discharge Plan and Services           Expected Discharge Date: 10/05/21                                     Social Determinants of Health (SDOH) Interventions    Readmission Risk Interventions     View : No data to display.

## 2021-10-05 NOTE — Progress Notes (Signed)
SATURATION QUALIFICATIONS: (This note is used to comply with regulatory documentation for home oxygen)  Patient Saturations on Room Air at Rest = 94%  Patient Saturations on Room Air while Ambulating = 87%  Patient Saturations on 2 Liters of oxygen while Ambulating = 96%  Please briefly explain why patient needs home oxygen: 

## 2021-10-05 NOTE — TOC Progression Note (Addendum)
Transition of Care [facility]) - Progression Note    Patient Details  Name: Julia [name] MRN: [id number] Date of Birth: March 06, 1961  Transition of Care [facility]) CM/SW Contact  Caryn Section, RN Phone Number: 10/05/2021, 10:56 AM  Clinical Narrative:   RNCM notified [name] at [facility] in preparation for discharge.  [name] states patient will need oxygen and NIV.  Oxygen saturations have been completed today to allow for qualifications.  [name] working on completing patient agreement and then will set up at home.  [name] will notify RNCM when all is set up for discharge.  Addendum:  36 Jason from [facility] has agreed to provide RN to patient's home       Expected Discharge Plan and Services           Expected Discharge Date: 10/05/21                                     Social Determinants of Health (SDOH) Interventions    Readmission Risk Interventions     View : No data to display.

## 2021-10-11 ENCOUNTER — Other Ambulatory Visit: Payer: Self-pay

## 2021-10-11 ENCOUNTER — Emergency Department
Admission: EM | Admit: 2021-10-11 | Discharge: 2021-10-11 | Disposition: A | Discharge status: Home / Self Care | Payer: 59 | Attending: Emergency Medicine | Admitting: Emergency Medicine

## 2021-10-11 DIAGNOSIS — K439 Ventral hernia without obstruction or gangrene: Secondary | ICD-10-CM | POA: Diagnosis not present

## 2021-10-11 DIAGNOSIS — R109 Unspecified abdominal pain: Secondary | ICD-10-CM | POA: Diagnosis present

## 2021-10-11 DIAGNOSIS — R110 Nausea: Secondary | ICD-10-CM | POA: Diagnosis not present

## 2021-10-11 LAB — COMPREHENSIVE METABOLIC PANEL
ALT: 21 U/L (ref 0–44)
AST: 15 U/L (ref 15–41)
Albumin: 3.2 g/dL — ABNORMAL LOW (ref 3.5–5.0)
Alkaline Phosphatase: 54 U/L (ref 38–126)
Anion gap: 7 (ref 5–15)
BUN: 22 mg/dL (ref 8–23)
CO2: 31 mmol/L (ref 22–32)
Calcium: 9.3 mg/dL (ref 8.9–10.3)
Chloride: 103 mmol/L (ref 98–111)
Creatinine, Ser: 0.58 mg/dL (ref 0.44–1.00)
GFR, Estimated: >60 mL/min (ref >60)
Glucose, Bld: 101 mg/dL — ABNORMAL HIGH (ref 70–99)
Potassium: 3.9 mmol/L (ref 3.5–5.1)
Sodium: 141 mmol/L (ref 135–145)
Total Bilirubin: 0.4 mg/dL (ref 0.3–1.2)
Total Protein: 6.5 g/dL (ref 6.5–8.1)

## 2021-10-11 LAB — CBC
HCT: 26.5 % — ABNORMAL LOW (ref 36.0–46.0)
Hemoglobin: 7.9 g/dL — ABNORMAL LOW (ref 12.0–15.0)
MCH: 23.7 pg — ABNORMAL LOW (ref 26.0–34.0)
MCHC: 29.8 g/dL — ABNORMAL LOW (ref 30.0–36.0)
MCV: 79.6 fL — ABNORMAL LOW (ref 80.0–100.0)
Platelets: 315 10*3/uL (ref 150–400)
RBC: 3.33 MIL/uL — ABNORMAL LOW (ref 3.87–5.11)
RDW: 19.2 % — ABNORMAL HIGH (ref 11.5–15.5)
WBC: 11.2 10*3/uL — ABNORMAL HIGH (ref 4.0–10.5)
nRBC: 0 % (ref 0.0–0.2)

## 2021-10-11 LAB — LIPASE, BLOOD: Lipase: 29 U/L (ref 11–51)

## 2021-10-11 MED ORDER — MORPHINE SULFATE (PF) 4 MG/ML IV SOLN
4.0000 mg | Freq: Once | INTRAVENOUS | Status: AC
  Administered 2021-10-11: 4 mg via INTRAVENOUS
  Filled 2021-10-11: qty 1

## 2021-10-11 MED ORDER — ONDANSETRON HCL 4 MG/2ML IJ SOLN
4.0000 mg | Freq: Once | INTRAMUSCULAR | Status: AC
  Administered 2021-10-11: 4 mg via INTRAVENOUS
  Filled 2021-10-11: qty 2

## 2021-10-11 NOTE — ED Triage Notes (Signed)
Pt with hernia presents today with sudden onset of pain starting tonight. Pt has PMH of hernia  and hernia repairs and states she has has this one for close to ten years.

## 2021-10-11 NOTE — ED Provider Notes (Signed)
[facility] Provider Note    Event Date/Time   First MD Initiated Contact with Patient 10/11/21 2144     (approximate)   History   Abdominal Pain   HPI  Julia [name] is a 61 y.o. female with a history of ventral hernia, anemia, anxiety who presents with complaints of abdominal pain.  Patient reports she was at church and her ventral hernia "got stuck ".  She reports significant pain in that area.  Positive nausea, no vomiting.  Symptoms started 1 hour prior to arrival     Physical Exam   Triage Vital Signs: ED Triage Vitals  Enc Vitals Group     BP 10/11/21 2123 114/80     Pulse Rate 10/11/21 2123 75     Resp 10/11/21 2123 18     Temp 10/11/21 2123 97.7 F (36.5 C)     Temp Source 10/11/21 2123 Oral     SpO2 10/11/21 2123 91 %     Weight 10/11/21 2123 56.7 kg (125 lb)     Height 10/11/21 2123 1.651 m (5\' 5" )     Head Circumference --      Peak Flow --      Pain Score 10/11/21 2127 10     Pain Loc --      Pain Edu? --      Excl. in GC? --     Most recent vital signs: Vitals:   10/11/21 2123 10/11/21 2222  BP: 114/80 105/73  Pulse: 75 75  Resp: 18 17  Temp: 97.7 F (36.5 C)   SpO2: 91% 96%     General: Awake, no distress.  CV:  Good peripheral perfusion.  Resp:  Normal effort.  Abd:  No distention.  Large ventral hernia protruding, mild tenderness to palpation Other:     ED Results / Procedures / Treatments   Labs (all labs ordered are listed, but only abnormal results are displayed) Labs Reviewed  COMPREHENSIVE METABOLIC PANEL - Abnormal; Notable for the following components:      Result Value   Glucose, Bld 101 (*)    Albumin 3.2 (*)    All other components within normal limits  CBC - Abnormal; Notable for the following components:   WBC 11.2 (*)    RBC 3.33 (*)    Hemoglobin 7.9 (*)    HCT 26.5 (*)    MCV 79.6 (*)    MCH 23.7 (*)    MCHC 29.8 (*)    RDW 19.2 (*)    All other components within normal limits   LIPASE, BLOOD  URINALYSIS, ROUTINE W REFLEX MICROSCOPIC     EKG     RADIOLOGY     PROCEDURES:  Critical Care performed:   Procedures   MEDICATIONS ORDERED IN ED: Medications  morphine (PF) 4 MG/ML injection 4 mg (4 mg Intravenous Given 10/11/21 2140)  ondansetron (ZOFRAN) injection 4 mg (4 mg Intravenous Given 10/11/21 2140)     IMPRESSION / MDM / ASSESSMENT AND PLAN / ED COURSE  I reviewed the triage vital signs and the nursing notes. Patient's presentation is most consistent with exacerbation of chronic illness.  Patient with longstanding ventral hernia, now protruding, concern for possible incarceration  Patient with IV morphine, IV Zofran  Placed the patient in significant Trendelenburg and was able to easily reduce the hernia, her pain resolved immediately.  Lab work reviewed and is unremarkable, CBC CMP unremarkable.  Patient is appropriate for discharge with outpatient follow-up with surgery  FINAL CLINICAL IMPRESSION(S) / ED DIAGNOSES   Final diagnoses:  Ventral hernia without obstruction or gangrene     Rx / DC Orders   ED Discharge Orders     None        Note:  This document was prepared using Dragon voice recognition software and may include unintentional dictation errors.   Jene Every, MD 10/11/21 2330

## 2021-10-11 NOTE — ED Provider Triage Note (Signed)
Emergency Medicine Provider Triage Evaluation Note  Julia [name] , a 61 y.o. female  was evaluated in triage.  Pt complains of abd pain/hernia.  Review of Systems  Positive: Hernia pain Negative: vomiting  Physical Exam  BP 114/80 (BP Location: Right Arm)   Pulse 75   Temp 97.7 F (36.5 C) (Oral)   Resp 18   Ht 1.651 m (5\' 5" )   Wt 56.7 kg   SpO2 91%   BMI 20.80 kg/m  Gen:   Awake, no distress   Resp:  Normal effort  MSK:   Moves extremities without difficulty  Other:  Large protruding ventral hernia  Medical Decision Making  Medically screening exam initiated at 9:38 PM.  Appropriate orders placed.  Julia [name] was informed that the remainder of the evaluation will be completed by another provider, this initial triage assessment does not replace that evaluation, and the importance of remaining in the ED until their evaluation is complete.     , MD 10/11/21 2138

## 2021-10-13 ENCOUNTER — Telehealth: Payer: Self-pay | Admitting: Surgery

## 2021-10-13 NOTE — Telephone Encounter (Signed)
Left a  message for the patient to call the office, Dr. Everlene Farrier would like to see the patient in a couple weeks for   ventral hernia without obstruction or gangrene please schedule if patient calls

## 2021-10-26 ENCOUNTER — Telehealth: Payer: Self-pay

## 2021-10-26 ENCOUNTER — Ambulatory Visit: Payer: 59 | Admitting: Surgery

## 2021-10-26 ENCOUNTER — Encounter: Payer: Self-pay | Admitting: Surgery

## 2021-10-26 ENCOUNTER — Other Ambulatory Visit: Payer: Self-pay

## 2021-10-26 VITALS — BP 129/78 | HR 73 | Temp 97.9°F | Ht 65.0 in | Wt 121.2 lb

## 2021-10-26 DIAGNOSIS — K432 Incisional hernia without obstruction or gangrene: Secondary | ICD-10-CM

## 2021-10-26 DIAGNOSIS — K439 Ventral hernia without obstruction or gangrene: Secondary | ICD-10-CM

## 2022-01-05 DIAGNOSIS — M542 Cervicalgia: Secondary | ICD-10-CM | POA: Diagnosis not present

## 2022-01-05 DIAGNOSIS — M47892 Other spondylosis, cervical region: Secondary | ICD-10-CM | POA: Diagnosis not present

## 2022-01-25 ENCOUNTER — Other Ambulatory Visit: Payer: Self-pay

## 2022-01-25 ENCOUNTER — Telehealth: Payer: Self-pay

## 2022-01-25 DIAGNOSIS — K432 Incisional hernia without obstruction or gangrene: Secondary | ICD-10-CM

## 2022-01-25 DIAGNOSIS — R1084 Generalized abdominal pain: Secondary | ICD-10-CM

## 2022-01-25 DIAGNOSIS — K439 Ventral hernia without obstruction or gangrene: Secondary | ICD-10-CM

## 2022-01-25 NOTE — Telephone Encounter (Signed)
Received call from Dr Sharen Counter office at [facility] regarding referral for this patient. They will need to have the patient get a CT abdomen and pelvis w/o contrast before she can see Dr Sharen Counter at [facility]. This has been ordered and scheduled at [facility] imaging on 02/03/22 at 11:30 am. The patient will need to arrive there by 11:15 am and have nothing to eat or drink for 4 hours prior. She will also need to pick up an oral contras kit prior to this scan.

## 2022-01-27 NOTE — Telephone Encounter (Signed)
Spoke with the patient and notified her of the imaging information. Once we get the results I will have this Power Shared to the [facility] system.

## 2022-02-02 ENCOUNTER — Ambulatory Visit
Admission: RE | Admit: 2022-02-02 | Discharge: 2022-02-02 | Disposition: A | Discharge status: Home / Self Care | Payer: 59 | Source: Ambulatory Visit | Attending: Surgery | Admitting: Surgery

## 2022-02-02 DIAGNOSIS — R1084 Generalized abdominal pain: Secondary | ICD-10-CM | POA: Insufficient documentation

## 2022-02-02 DIAGNOSIS — K439 Ventral hernia without obstruction or gangrene: Secondary | ICD-10-CM | POA: Insufficient documentation

## 2022-02-02 DIAGNOSIS — K432 Incisional hernia without obstruction or gangrene: Secondary | ICD-10-CM | POA: Diagnosis not present

## 2022-02-02 DIAGNOSIS — K8020 Calculus of gallbladder without cholecystitis without obstruction: Secondary | ICD-10-CM | POA: Diagnosis not present

## 2022-02-03 ENCOUNTER — Ambulatory Visit: Admission: RE | Admit: 2022-02-03 | Payer: 59 | Source: Ambulatory Visit

## 2022-02-04 DIAGNOSIS — J449 Chronic obstructive pulmonary disease, unspecified: Secondary | ICD-10-CM | POA: Diagnosis not present

## 2022-02-07 DIAGNOSIS — J449 Chronic obstructive pulmonary disease, unspecified: Secondary | ICD-10-CM | POA: Diagnosis not present

## 2022-02-07 DIAGNOSIS — J9621 Acute and chronic respiratory failure with hypoxia: Secondary | ICD-10-CM | POA: Diagnosis not present

## 2022-02-26 ENCOUNTER — Inpatient Hospital Stay: Payer: 59

## 2022-02-26 ENCOUNTER — Other Ambulatory Visit: Payer: Self-pay

## 2022-02-26 ENCOUNTER — Inpatient Hospital Stay
Admission: EM | Admit: 2022-02-26 | Discharge: 2022-03-02 | DRG: 193 | Disposition: A | Discharge status: Home / Self Care | Payer: 59 | Attending: Family Medicine | Admitting: Family Medicine

## 2022-02-26 ENCOUNTER — Emergency Department: Payer: 59

## 2022-02-26 DIAGNOSIS — J9621 Acute and chronic respiratory failure with hypoxia: Secondary | ICD-10-CM

## 2022-02-26 DIAGNOSIS — F418 Other specified anxiety disorders: Secondary | ICD-10-CM | POA: Diagnosis present

## 2022-02-26 DIAGNOSIS — Z7951 Long term (current) use of inhaled steroids: Secondary | ICD-10-CM | POA: Diagnosis not present

## 2022-02-26 DIAGNOSIS — Z743 Need for continuous supervision: Secondary | ICD-10-CM | POA: Diagnosis not present

## 2022-02-26 DIAGNOSIS — J189 Pneumonia, unspecified organism: Secondary | ICD-10-CM | POA: Diagnosis not present

## 2022-02-26 DIAGNOSIS — Z1152 Encounter for screening for COVID-19: Secondary | ICD-10-CM

## 2022-02-26 DIAGNOSIS — K439 Ventral hernia without obstruction or gangrene: Secondary | ICD-10-CM | POA: Diagnosis present

## 2022-02-26 DIAGNOSIS — R059 Cough, unspecified: Secondary | ICD-10-CM | POA: Diagnosis not present

## 2022-02-26 DIAGNOSIS — R0902 Hypoxemia: Secondary | ICD-10-CM | POA: Diagnosis not present

## 2022-02-26 DIAGNOSIS — F17200 Nicotine dependence, unspecified, uncomplicated: Secondary | ICD-10-CM | POA: Diagnosis present

## 2022-02-26 DIAGNOSIS — J441 Chronic obstructive pulmonary disease with (acute) exacerbation: Secondary | ICD-10-CM

## 2022-02-26 DIAGNOSIS — R062 Wheezing: Secondary | ICD-10-CM | POA: Diagnosis not present

## 2022-02-26 DIAGNOSIS — J9601 Acute respiratory failure with hypoxia: Secondary | ICD-10-CM

## 2022-02-26 DIAGNOSIS — F419 Anxiety disorder, unspecified: Secondary | ICD-10-CM | POA: Diagnosis present

## 2022-02-26 DIAGNOSIS — F17210 Nicotine dependence, cigarettes, uncomplicated: Secondary | ICD-10-CM | POA: Diagnosis not present

## 2022-02-26 DIAGNOSIS — Z79899 Other long term (current) drug therapy: Secondary | ICD-10-CM | POA: Diagnosis not present

## 2022-02-26 DIAGNOSIS — F329 Major depressive disorder, single episode, unspecified: Secondary | ICD-10-CM | POA: Diagnosis not present

## 2022-02-26 DIAGNOSIS — R0689 Other abnormalities of breathing: Secondary | ICD-10-CM | POA: Diagnosis not present

## 2022-02-26 DIAGNOSIS — R0602 Shortness of breath: Secondary | ICD-10-CM | POA: Diagnosis not present

## 2022-02-26 DIAGNOSIS — Z811 Family history of alcohol abuse and dependence: Secondary | ICD-10-CM | POA: Diagnosis not present

## 2022-02-26 DIAGNOSIS — J440 Chronic obstructive pulmonary disease with acute lower respiratory infection: Secondary | ICD-10-CM | POA: Diagnosis not present

## 2022-02-26 DIAGNOSIS — K449 Diaphragmatic hernia without obstruction or gangrene: Secondary | ICD-10-CM | POA: Diagnosis not present

## 2022-02-26 DIAGNOSIS — R9431 Abnormal electrocardiogram [ECG] [EKG]: Secondary | ICD-10-CM | POA: Diagnosis not present

## 2022-02-26 DIAGNOSIS — J9620 Acute and chronic respiratory failure, unspecified whether with hypoxia or hypercapnia: Secondary | ICD-10-CM | POA: Diagnosis present

## 2022-02-26 DIAGNOSIS — Z9981 Dependence on supplemental oxygen: Secondary | ICD-10-CM

## 2022-02-26 DIAGNOSIS — R918 Other nonspecific abnormal finding of lung field: Secondary | ICD-10-CM | POA: Diagnosis not present

## 2022-02-26 HISTORY — DX: Acute and chronic respiratory failure with hypercapnia: J96.21

## 2022-02-26 LAB — BLOOD GAS, VENOUS
Acid-Base Excess: 5.2 mmol/L — ABNORMAL HIGH (ref 0.0–2.0)
Bicarbonate: 31.5 mmol/L — ABNORMAL HIGH (ref 20.0–28.0)
Patient temperature: 37
pCO2, Ven: 52 mmHg (ref 44–60)
pH, Ven: 7.39 (ref 7.25–7.43)
pO2, Ven: 84 mmHg — ABNORMAL HIGH (ref 32–45)

## 2022-02-26 LAB — CBC WITH DIFFERENTIAL/PLATELET
Abs Immature Granulocytes: 0.02 10*3/uL (ref 0.00–0.07)
Basophils Absolute: 0 10*3/uL (ref 0.0–0.1)
Basophils Relative: 0 %
Eosinophils Absolute: 0.1 10*3/uL (ref 0.0–0.5)
Eosinophils Relative: 1 %
HCT: 29.6 % — ABNORMAL LOW (ref 36.0–46.0)
Hemoglobin: 9.1 g/dL — ABNORMAL LOW (ref 12.0–15.0)
Immature Granulocytes: 0 %
Lymphocytes Relative: 15 %
Lymphs Abs: 1.2 10*3/uL (ref 0.7–4.0)
MCH: 24.8 pg — ABNORMAL LOW (ref 26.0–34.0)
MCHC: 30.7 g/dL (ref 30.0–36.0)
MCV: 80.7 fL (ref 80.0–100.0)
Monocytes Absolute: 0.5 10*3/uL (ref 0.1–1.0)
Monocytes Relative: 6 %
Neutro Abs: 6.3 10*3/uL (ref 1.7–7.7)
Neutrophils Relative %: 78 %
Platelets: 210 10*3/uL (ref 150–400)
RBC: 3.67 MIL/uL — ABNORMAL LOW (ref 3.87–5.11)
RDW: 17.6 % — ABNORMAL HIGH (ref 11.5–15.5)
WBC: 8.1 10*3/uL (ref 4.0–10.5)
nRBC: 0 % (ref 0.0–0.2)

## 2022-02-26 LAB — BASIC METABOLIC PANEL
Anion gap: 8 (ref 5–15)
BUN: 12 mg/dL (ref 8–23)
CO2: 27 mmol/L (ref 22–32)
Calcium: 9.2 mg/dL (ref 8.9–10.3)
Chloride: 102 mmol/L (ref 98–111)
Creatinine, Ser: 0.57 mg/dL (ref 0.44–1.00)
GFR, Estimated: >60 mL/min (ref >60)
Glucose, Bld: 155 mg/dL — ABNORMAL HIGH (ref 70–99)
Potassium: 4 mmol/L (ref 3.5–5.1)
Sodium: 137 mmol/L (ref 135–145)

## 2022-02-26 LAB — SARS CORONAVIRUS 2 BY RT PCR: SARS Coronavirus 2 by RT PCR: NEGATIVE

## 2022-02-26 LAB — BRAIN NATRIURETIC PEPTIDE: B Natriuretic Peptide: 82.9 pg/mL (ref 0.0–100.0)

## 2022-02-26 LAB — LACTIC ACID, PLASMA: Lactic Acid, Venous: 0.6 mmol/L (ref 0.5–1.9)

## 2022-02-26 MED ORDER — ONDANSETRON HCL 4 MG PO TABS: 4.0000 mg | ORAL_TABLET | Freq: Four times a day (QID) | ORAL | Status: DC | PRN

## 2022-02-26 MED ORDER — SODIUM CHLORIDE 0.9 % IV SOLN: INTRAVENOUS | Status: AC

## 2022-02-26 MED ORDER — SODIUM CHLORIDE 0.9 % IV SOLN
500.0000 mg | INTRAVENOUS | Status: DC
  Administered 2022-02-27: 500 mg via INTRAVENOUS
  Filled 2022-02-26: qty 5

## 2022-02-26 MED ORDER — ONDANSETRON HCL 4 MG/2ML IJ SOLN: 4.0000 mg | Freq: Four times a day (QID) | INTRAMUSCULAR | Status: DC | PRN

## 2022-02-26 MED ORDER — SODIUM CHLORIDE 0.9 % IV BOLUS (SEPSIS)
1000.0000 mL | Freq: Once | INTRAVENOUS | Status: AC
  Administered 2022-02-26: 1000 mL via INTRAVENOUS

## 2022-02-26 MED ORDER — MAGNESIUM OXIDE 400 MG PO TABS
400.0000 mg | ORAL_TABLET | Freq: Every day | ORAL | Status: DC
  Administered 2022-02-27 – 2022-03-02 (×4): 400 mg via ORAL
  Filled 2022-02-26 (×8): qty 1

## 2022-02-26 MED ORDER — IPRATROPIUM-ALBUTEROL 0.5-2.5 (3) MG/3ML IN SOLN
3.0000 mL | Freq: Once | RESPIRATORY_TRACT | Status: AC
  Administered 2022-02-26: 3 mL via RESPIRATORY_TRACT
  Filled 2022-02-26: qty 3

## 2022-02-26 MED ORDER — PREDNISONE 20 MG PO TABS
40.0000 mg | ORAL_TABLET | Freq: Every day | ORAL | Status: AC
  Administered 2022-02-27 – 2022-03-02 (×4): 40 mg via ORAL
  Filled 2022-02-26 (×4): qty 2

## 2022-02-26 MED ORDER — UMECLIDINIUM BROMIDE 62.5 MCG/ACT IN AEPB: 1.0000 | INHALATION_SPRAY | Freq: Every day | RESPIRATORY_TRACT | Status: DC

## 2022-02-26 MED ORDER — SERTRALINE HCL 50 MG PO TABS
150.0000 mg | ORAL_TABLET | Freq: Every day | ORAL | Status: DC
  Administered 2022-02-26 – 2022-03-02 (×5): 150 mg via ORAL
  Filled 2022-02-26 (×5): qty 3

## 2022-02-26 MED ORDER — DOCUSATE SODIUM 100 MG PO CAPS: 100.0000 mg | ORAL_CAPSULE | Freq: Every day | ORAL | Status: DC | PRN

## 2022-02-26 MED ORDER — SODIUM CHLORIDE 0.9 % IV SOLN
1.0000 g | Freq: Once | INTRAVENOUS | Status: AC
  Administered 2022-02-26: 1 g via INTRAVENOUS
  Filled 2022-02-26: qty 10

## 2022-02-26 MED ORDER — SODIUM CHLORIDE 0.9 % IV SOLN
500.0000 mg | Freq: Once | INTRAVENOUS | Status: AC
  Administered 2022-02-26: 500 mg via INTRAVENOUS
  Filled 2022-02-26: qty 5

## 2022-02-26 MED ORDER — METHYLPREDNISOLONE SODIUM SUCC 40 MG IJ SOLR
40.0000 mg | Freq: Two times a day (BID) | INTRAMUSCULAR | Status: AC
  Administered 2022-02-26 (×2): 40 mg via INTRAVENOUS
  Filled 2022-02-26 (×2): qty 1

## 2022-02-26 MED ORDER — SODIUM CHLORIDE 0.9 % IV SOLN
2.0000 g | INTRAVENOUS | Status: AC
  Administered 2022-02-27 – 2022-03-02 (×4): 2 g via INTRAVENOUS
  Filled 2022-02-26: qty 20
  Filled 2022-02-26 (×3): qty 2

## 2022-02-26 MED ORDER — GUAIFENESIN 100 MG/5ML PO LIQD: 5.0000 mL | ORAL | Status: DC | PRN

## 2022-02-26 MED ORDER — IPRATROPIUM-ALBUTEROL 0.5-2.5 (3) MG/3ML IN SOLN
3.0000 mL | Freq: Four times a day (QID) | RESPIRATORY_TRACT | Status: DC
  Administered 2022-02-26 – 2022-03-02 (×17): 3 mL via RESPIRATORY_TRACT
  Filled 2022-02-26 (×17): qty 3

## 2022-02-26 MED ORDER — PANTOPRAZOLE SODIUM 40 MG PO TBEC
40.0000 mg | DELAYED_RELEASE_TABLET | Freq: Every day | ORAL | Status: DC
  Administered 2022-02-26 – 2022-03-02 (×5): 40 mg via ORAL
  Filled 2022-02-26 (×5): qty 1

## 2022-02-26 MED ORDER — ALBUTEROL SULFATE (2.5 MG/3ML) 0.083% IN NEBU
2.5000 mg | INHALATION_SOLUTION | RESPIRATORY_TRACT | Status: DC | PRN
  Administered 2022-02-26: 2.5 mg via RESPIRATORY_TRACT
  Filled 2022-02-26: qty 3

## 2022-02-26 MED ORDER — ALPRAZOLAM 0.5 MG PO TABS
0.5000 mg | ORAL_TABLET | Freq: Two times a day (BID) | ORAL | Status: DC | PRN
  Administered 2022-02-26 – 2022-03-01 (×4): 0.5 mg via ORAL
  Filled 2022-02-26 (×4): qty 1

## 2022-02-26 MED ORDER — ENOXAPARIN SODIUM 40 MG/0.4ML IJ SOSY
40.0000 mg | PREFILLED_SYRINGE | INTRAMUSCULAR | Status: DC
  Administered 2022-02-26 – 2022-03-01 (×4): 40 mg via SUBCUTANEOUS
  Filled 2022-02-26 (×4): qty 0.4

## 2022-02-26 MED ORDER — GABAPENTIN 300 MG PO CAPS
300.0000 mg | ORAL_CAPSULE | Freq: Three times a day (TID) | ORAL | Status: DC
  Administered 2022-02-26 – 2022-03-02 (×13): 300 mg via ORAL
  Filled 2022-02-26 (×13): qty 1

## 2022-02-26 MED ORDER — FLUTICASONE FUROATE-VILANTEROL 100-25 MCG/ACT IN AEPB
1.0000 | INHALATION_SPRAY | Freq: Every day | RESPIRATORY_TRACT | Status: DC
  Administered 2022-02-27 – 2022-03-02 (×3): 1 via RESPIRATORY_TRACT
  Filled 2022-02-26: qty 28

## 2022-02-26 MED ORDER — ACETAMINOPHEN 500 MG PO TABS
1000.0000 mg | ORAL_TABLET | Freq: Four times a day (QID) | ORAL | Status: DC | PRN
  Administered 2022-02-26: 1000 mg via ORAL
  Filled 2022-02-26: qty 2

## 2022-02-26 NOTE — ED Notes (Signed)
RT in room placing bipap on pt

## 2022-02-26 NOTE — Assessment & Plan Note (Signed)
Smoking cessation was discussed with patient in detail She currently nicotine transdermal patch and states that she quit smoking a couple of days prior to being admitted.

## 2022-02-26 NOTE — Assessment & Plan Note (Signed)
Patient with a history of COPD with chronic respiratory failure admitted for worsening shortness of breath associated with diffuse wheezes and a productive cough. Place patient on scheduled and as needed bronchodilator therapy Place patient on IV Solu-Medrol 40 mg every 12 Antitussives to improve expectoration Continue oxygen supplementation to maintain pulse oximetry greater than 92%

## 2022-02-26 NOTE — Plan of Care (Signed)
°  Problem: Education: °Goal: Knowledge of disease or condition will improve °Outcome: Progressing °Goal: Knowledge of the prescribed therapeutic regimen will improve °Outcome: Progressing °Goal: Individualized Educational Video(s) °Outcome: Progressing °  °Problem: Activity: °Goal: Ability to tolerate increased activity will improve °Outcome: Progressing °  °

## 2022-02-26 NOTE — ED Notes (Signed)
Resting comfortably, sleeping, NAD, calm, tolerating [place], RT notified, BP improved, Bipap held at this time.

## 2022-02-26 NOTE — ED Notes (Signed)
To CT on Bipap

## 2022-02-26 NOTE — Assessment & Plan Note (Signed)
Patient noted to have a right lower lobe pneumonia on chest x-ray We will treat patient empirically with Rocephin and Zithromax to complete a 5-day course of therapy

## 2022-02-26 NOTE — Assessment & Plan Note (Signed)
Continue sertraline and alprazolam

## 2022-02-26 NOTE — H&P (Signed)
History and Physical    Patient: Julia [name] SWN:[id number] DOB: 1960-12-13 DOA: 02/26/2022 DOS: the patient was seen and examined on 02/26/2022 PCP: Romualdo Bolk, FNP  Patient coming from: Home  Chief Complaint:  Chief Complaint  Patient presents with   Shortness of Breath   HPI: Julia [name] is a 61 y.o. female with medical history significant for COPD with chronic respiratory failure on 2.5 L of oxygen at night, depression, ventral hernia and nicotine dependence who presents to the ER via EMS for evaluation of worsening shortness of breath. According to the patient she has had symptoms for close to a month consisting of a productive cough, shortness of breath and wheezing and was treated with a course of steroids without significant improvement.  On the morning of admission she woke up at about 4 AM unable to breathe with diffuse wheezes.  She used her nebulizer without any improvement and her pulse oximetry on room air was in the low 80s so her husband called EMS.  She has a cough productive of yellow phlegm but denies having any fever or chills and denies having any sick contacts. EMS administered 125 mg of Solu-Medrol and magnesium 2 g and she was transported to the ER where she was placed on BiPAP due to increased work of breathing and to improve oxygenation. She denies having any chest pain, no nausea, no vomiting, no abdominal pain, no changes in her bowel habits, no dizziness, no lightheadedness, no leg swelling, no blurred vision, no focal deficits, no palpitations or diaphoresis. Chest x-ray showed right lower lung pneumonia. Short-term interval follow-up is advised to ensure resolution and to exclude underlying malignancy. Diffuse reticular and nodular interstitial markings are noted within the remaining portions of the lungs which may reflect sequelae of chronic atypical inflammation or infection. She received a dose of Rocephin 1 g IV and Zithromax 500 g IV as well as  1 L fluid bolus and will be admitted to the hospital for further evaluation.    Review of Systems: As mentioned in the history of present illness. All other systems reviewed and are negative. Past Medical History:  Diagnosis Date   Anemia    Anxiety    Asthma    COPD (chronic obstructive pulmonary disease) ([place])    Depression    Ventral hernia    Past Surgical History:  Procedure Laterality Date   COLON SURGERY     EAR TUBE REMOVAL     FRACTURE SURGERY     HERNIA REPAIR     TUBAL LIGATION     Social History:  reports that she has been smoking cigarettes. She has been smoking an average of .5 packs per day. She has never used smokeless tobacco. She reports that she does not drink alcohol and does not use drugs.  Allergies  Allergen Reactions   Banana Nausea And Vomiting    Family History  Problem Relation Age of Onset   Sarcoidosis Mother    Alcohol abuse Father     Prior to Admission medications   Medication Sig Start Date End Date Taking? Authorizing Provider  Varenicline Tartrate, Starter, 0.5 MG X 11 & 1 MG X 42 TBPK Take one 0.5mg  tab once daily for 3 days,then increase to one 0.5mg  tab twice daily for 4 days,then increase to one 1mg  tab twice daily. 01/08/22 04/08/22 Yes [provider]  acetaminophen (TYLENOL) 500 MG tablet Take 1,000 mg by mouth every 6 (six) hours as needed.    [provider]  albuterol (PROVENTIL HFA;VENTOLIN HFA) 108 (90 BASE) MCG/ACT inhaler Inhale 2 puffs into the lungs every 6 (six) hours as needed for wheezing or shortness of breath. 04/09/15   Adrian Saran, MD  albuterol (PROVENTIL) (2.5 MG/3ML) 0.083% nebulizer solution Take 2.5 mg by nebulization every 6 (six) hours as needed for wheezing or shortness of breath.    [provider]  ALPRAZolam Prudy Feeler) 0.5 MG tablet Take 0.5 mg by mouth 2 (two) times daily as needed.    [provider]  dextromethorphan-guaiFENesin (MUCINEX DM) 30-600 MG 12hr tablet Take 1  tablet by mouth 2 (two) times daily. 03/20/21   Rolly Salter, MD  docusate sodium (COLACE) 100 MG capsule Take 100 mg by mouth daily as needed for mild constipation.    [provider]  ferrous sulfate 325 (65 FE) MG tablet Take 1 tablet (325 mg total) by mouth daily. 10/05/21 11/04/21  Marrion Coy, MD  gabapentin (NEURONTIN) 300 MG capsule Take 300 mg by mouth 3 (three) times daily. 12/17/20   [provider]  guaiFENesin (ROBITUSSIN) 100 MG/5ML liquid Take 5 mLs by mouth every 4 (four) hours as needed for cough or to loosen phlegm. 07/21/21   Delfino Lovett, MD  ipratropium-albuterol (DUONEB) 0.5-2.5 (3) MG/3ML SOLN Take 3 mLs by nebulization every 4 (four) hours as needed.    [provider]  magnesium oxide (MAG-OX) 400 MG tablet Take 400 mg by mouth daily.    [provider]  pantoprazole (PROTONIX) 40 MG tablet Take 40 mg by mouth daily.    [provider]  sertraline (ZOLOFT) 100 MG tablet Take 150 mg by mouth daily.    [provider]  TRELEGY ELLIPTA 100-62.5-25 MCG/ACT AEPB Inhale 1 puff into the lungs daily. 07/10/21   [provider]    Physical Exam: Vitals:   02/26/22 0805 02/26/22 0815 02/26/22 0830 02/26/22 0845  BP: 120/86 101/73 96/68 106/70  Pulse: 79 76 70 68  Resp: (!) 22 18 (!) 22 18  Temp:      TempSrc:      SpO2: 100% 100% 99% 99%  Weight:      Height:       Physical Exam Vitals and nursing note reviewed.  Constitutional:      Appearance: She is well-developed.     Comments: Appears comfortable on BiPAP   HENT:     Head: Normocephalic and atraumatic.     Mouth/Throat:     Mouth: Mucous membranes are moist.  Cardiovascular:     Rate and Rhythm: Normal rate and regular rhythm.  Pulmonary:     Effort: Tachypnea present.     Breath sounds: Examination of the right-upper field reveals wheezing. Examination of the left-upper field reveals wheezing. Examination of the right-middle field reveals wheezing.  Examination of the left-middle field reveals wheezing. Examination of the right-lower field reveals wheezing and rhonchi. Examination of the left-lower field reveals wheezing. Wheezing and rhonchi present.  Abdominal:     General: Bowel sounds are normal.     Palpations: Abdomen is soft.     Comments: Ventral hernia  Musculoskeletal:        General: Normal range of motion.     Cervical back: Normal range of motion and neck supple.  Skin:    General: Skin is warm and dry.  Neurological:     General: No focal deficit present.     Mental Status: She is alert and oriented to person, place, and time.  Psychiatric:        Mood and Affect: Mood normal.        Behavior: Behavior normal.     Data Reviewed: Relevant notes from primary care and specialist visits, past discharge summaries as available in EHR, including Care Everywhere. Prior diagnostic testing as pertinent to current admission diagnoses Updated medications and problem lists for reconciliation ED course, including vitals, labs, imaging, treatment and response to treatment Triage notes, nursing and pharmacy notes and ED provider's notes Notable results as noted in HPI Labs reviewed.  BNP 82.9, sodium 137, potassium 4.0, chloride 102, bicarb 27, glucose 155, BUN 12, creatinine 0.57, calcium 9.2, lactic acid 0.6, white count 8.1, hemoglobin 9.1, hematocrit 29.6, MCV 80.7, platelet count 210, VBG 7.39/52/84/31.5 Twelve-lead EKG reviewed by me shows sinus rhythm with RVH There are no new results to review at this time.  Assessment and Plan: * Acute on chronic respiratory failure (HCC) Secondary to acute COPD exacerbation At baseline patient wears 2.5 L of oxygen at night but presented to the ER in respiratory distress with use of accessory muscles and room air pulse oximetry in the low 80s requiring BiPAP to improve oxygenation and reduce work of breathing. We will attempt to wean patient off BiPAP as tolerated  CAP (community  acquired pneumonia) Patient noted to have a right lower lobe pneumonia on chest x-ray We will treat patient empirically with Rocephin and Zithromax to complete a 5-day course of therapy  COPD with acute exacerbation (HCC) Patient with a history of COPD with chronic respiratory failure admitted for worsening shortness of breath associated with diffuse wheezes and a productive cough. Place patient on scheduled and as needed bronchodilator therapy Place patient on IV Solu-Medrol 40 mg every 12 Antitussives to improve expectoration Continue oxygen supplementation to maintain pulse oximetry greater than 92%  Nicotine dependence Smoking cessation was discussed with patient in detail She currently nicotine transdermal patch and states that she quit smoking a couple of days prior to being admitted.  Depression with anxiety Continue sertraline and alprazolam      Advance Care Planning:   Code Status: Full Code   Consults: None  Family Communication: Greater than 50% of time was spent discussing patient's condition and plan of care with her and her husband at the bedside.  All questions and concerns have been addressed.  They verbalized understanding and agree with the plan.  Severity of Illness: The appropriate patient status for this patient is INPATIENT. Inpatient status is judged to be reasonable and necessary in order to provide the required intensity of service to ensure the patient's safety. The patient's presenting symptoms, physical exam findings, and initial radiographic and laboratory data in the context of their chronic comorbidities is felt to place them at high risk for further clinical deterioration. Furthermore, it is not anticipated that the patient will be medically stable for discharge from the hospital within 2 midnights of admission.   * I certify that at the point of admission it is my clinical judgment that the patient will require inpatient hospital care spanning beyond 2  midnights from the point of admission due to high intensity of service, high risk for further deterioration and high frequency of surveillance required.*  Author: Lucile Shutters, MD 02/26/2022 9:33 AM  For on call review [email or web address].

## 2022-02-26 NOTE — ED Triage Notes (Signed)
0400 woke up SOB with suspected COPD exacerbation. Hypoxic on EMS arrival. Pt wears 2-3L [place] at baseline. Pt arrives with neb running. 2 nebs given en route along with 125 mg Solumedrol, and 2 G Mg. Pt arrives alert and oriented following commands. Pt reports feeling less SOB currently. Denies cp.

## 2022-02-26 NOTE — Assessment & Plan Note (Signed)
Secondary to acute COPD exacerbation At baseline patient wears 2.5 L of oxygen at night but presented to the ER in respiratory distress with use of accessory muscles and room air pulse oximetry in the low 80s requiring BiPAP to improve oxygenation and reduce work of breathing. We will attempt to wean patient off BiPAP as tolerated

## 2022-02-26 NOTE — ED Provider Notes (Addendum)
[facility] Provider Note    Event Date/Time   First MD Initiated Contact with Patient 02/26/22 [phone]     (approximate)   History   Shortness of Breath   HPI  Julia [name] is a 61 y.o. female   Past medical history of PD, depression, anxiety, post to be on home CPAP at night but refuses to wear it due to loud noise, who presents to the emergency department with 1 month of dyspnea on exertion, wheezing, cough, increased sputum that has acutely worsened over the last 3 days.  Subjective fever and chills.  Pain.  No leg pain or swelling.  EMS reports oxygen saturations in the low to mid 80s on room air.  History was obtained via the patient. Independent historian includes EMS personnel who give collateral information, states that they gave Solu-Medrol 125 via IV, 2 DuoNebs, magnesium in route with some relief of symptoms. Also reviewed discharge summary from June 2023 when she was admitted for multifocal pneumonia and COPD exacerbation.      Physical Exam   Triage Vital Signs: ED Triage Vitals  Enc Vitals Group     BP 02/26/22 0642 106/74     Pulse Rate 02/26/22 0637 79     Resp --      Temp 02/26/22 0639 97.9 F (36.6 C)     Temp Source 02/26/22 0637 Oral     SpO2 02/26/22 0637 95 %     Weight 02/26/22 0638 126 lb (57.2 kg)     Height 02/26/22 0638 5\' 5"  (1.651 m)     Head Circumference --      Peak Flow --      Pain Score --      Pain Loc --      Pain Edu? --      Excl. in [place]? --     Most recent vital signs: Vitals:   03/02/22 0743 03/02/22 1126  BP: 122/69 118/66  Pulse: 76 71  Resp: 17 17  Temp: 98.2 F (36.8 C) 98.3 F (36.8 C)  SpO2: 97% 96%    General: Awake, alert, conversant. CV:  Good peripheral perfusion.  Resp:  Aspiratory distress, wheezing throughout all lung fields, speaking in short sentences, and getting DuoNeb. Abd:  Hernia at baseline soft and nontender    ED Results / Procedures / Treatments    Labs (all labs ordered are listed, but only abnormal results are displayed) Labs Reviewed  CBC WITH DIFFERENTIAL/PLATELET - Abnormal; Notable for the following components:      Result Value   RBC 3.67 (*)    Hemoglobin 9.1 (*)    HCT 29.6 (*)    MCH 24.8 (*)    RDW 17.6 (*)    All other components within normal limits  BASIC METABOLIC PANEL - Abnormal; Notable for the following components:   Glucose, Bld 155 (*)    All other components within normal limits  BLOOD GAS, VENOUS - Abnormal; Notable for the following components:   pO2, Ven 84 (*)    Bicarbonate 31.5 (*)    Acid-Base Excess 5.2 (*)    All other components within normal limits  BASIC METABOLIC PANEL - Abnormal; Notable for the following components:   Glucose, Bld 131 (*)    All other components within normal limits  CBC - Abnormal; Notable for the following components:   RBC 3.49 (*)    Hemoglobin 8.5 (*)    HCT 28.0 (*)    [facility]  24.4 (*)    RDW 17.8 (*)    All other components within normal limits  SARS CORONAVIRUS 2 BY RT PCR  MRSA NEXT GEN BY PCR, NASAL  BRAIN NATRIURETIC PEPTIDE  LACTIC ACID, PLASMA   I have independently reviewed the labs ordered and note a venous blood gas with normal pH and PCO2 of 52  EKG  ED ECG REPORT I, Pilar Jarvis, the attending physician, personally viewed and interpreted this ECG.   Date: 02/26/2022  EKG Time: 0643  Rate: 70  Rhythm: normal EKG, normal sinus rhythm  Axis: nl  Intervals:none  ST&T Change: no ischemic changes - isolated TWI v1 and v3    RADIOLOGY I independently reviewed and interpreted the chest x-ray and see multifocal opacities concerning for multifocal pneumonia   PROCEDURES:  Critical Care performed: Yes, see critical care procedure note(s)  .Critical Care  Performed by: Pilar Jarvis, MD Authorized by: Pilar Jarvis, MD   Critical care provider statement:    Critical care time (minutes):  30   Critical care time was exclusive of:  Separately  billable procedures and treating other patients   Critical care was necessary to treat or prevent imminent or life-threatening deterioration of the following conditions:  Respiratory failure   Critical care was time spent personally by me on the following activities:  Development of treatment plan with patient or surrogate, discussions with consultants, evaluation of patient's response to treatment, examination of patient, ordering and review of laboratory studies, ordering and review of radiographic studies, ordering and performing treatments and interventions, pulse oximetry and re-evaluation of patient's condition   Care discussed with: admitting provider      MEDICATIONS ORDERED IN ED: Medications  0.9 %  sodium chloride infusion (0 mLs Intravenous Stopping previously hung infusion 02/27/22 1342)  ipratropium-albuterol (DUONEB) 0.5-2.5 (3) MG/3ML nebulizer solution 3 mL (3 mLs Nebulization Given by Other 02/26/22 0653)  azithromycin (ZITHROMAX) 500 mg in sodium chloride 0.9 % 250 mL IVPB (0 mg Intravenous Stopped 02/26/22 0752)  cefTRIAXone (ROCEPHIN) 1 g in sodium chloride 0.9 % 100 mL IVPB (0 g Intravenous Stopped 02/26/22 0724)  sodium chloride 0.9 % bolus 1,000 mL (0 mLs Intravenous Stopped 02/26/22 0804)  cefTRIAXone (ROCEPHIN) 2 g in sodium chloride 0.9 % 100 mL IVPB (2 g Intravenous New Bag/Given 03/02/22 0554)  methylPREDNISolone sodium succinate (SOLU-MEDROL) 40 mg/mL injection 40 mg (40 mg Intravenous Given 02/26/22 2239)    Followed by  predniSONE (DELTASONE) tablet 40 mg (40 mg Oral Given 03/02/22 0908)    IMPRESSION / MDM / ASSESSMENT AND PLAN / ED COURSE  I reviewed the triage vital signs and the nursing notes.                              Differential diagnosis includes, but is not limited to, COPD exacerbation, acute hypoxemic respiratory failure, bacterial pneumonia, viral pneumonia, considered but less likely ACS or PE given no chest pain.   The patient is on the  cardiac monitor to evaluate for evidence of arrhythmia and/or significant heart rate changes.  MDM: Patient with evidence of COPD exacerbation and concern pneumonia given opacities on chest x-ray and productive sputum, give DuoNebs, already got Solu-Medrol by EMS, will initiate BiPAp for increased work of breathing, and give community-acquired pneumonia coverage.  Patient will require admission.  Signed out to oncoming provider.   Patient's presentation is most consistent with acute presentation with potential threat to life or bodily  function.       FINAL CLINICAL IMPRESSION(S) / ED DIAGNOSES   Final diagnoses:  COPD exacerbation ([place])  Acute hypoxemic respiratory failure ([place])  Community acquired pneumonia, unspecified laterality     Rx / DC Orders   ED Discharge Orders          Ordered    albuterol (VENTOLIN HFA) 108 (90 Base) MCG/ACT inhaler  Every 6 hours PRN        03/02/22 1030    TRELEGY ELLIPTA 100-62.5-25 MCG/ACT AEPB  Daily        03/02/22 1030    predniSONE (STERAPRED UNI-PAK 21 TAB) 10 MG (21) TBPK tablet        03/02/22 1030    Increase activity slowly        03/02/22 1031    Diet - low sodium heart healthy        03/02/22 1031    Discharge instructions       Comments: Advised to follow-up with primary care physician in 1 week. Advised to take prednisone taper as prescribed. Advised to continue CPAP as prescribed.   03/02/22 1031    Call MD for:  persistant nausea and vomiting        03/02/22 1031    Call MD for:  difficulty breathing, headache or visual disturbances        03/02/22 1031    Call MD for:  persistant dizziness or light-headedness        03/02/22 1031             Note:  This document was prepared using Dragon voice recognition software and may include unintentional dictation errors.    Lucillie Garfinkel, MD 02/26/22 1027    Lucillie Garfinkel, MD 03/10/22 2112

## 2022-02-26 NOTE — ED Notes (Signed)
Admitting MD at BS.  

## 2022-02-26 NOTE — ED Notes (Signed)
Resting comfortably on bipap, arousable to voice. Denies pain or other sx/complaints at this time. States, "feeling better". Registration at [facility].

## 2022-02-26 NOTE — ED Notes (Addendum)
EDP finished at [facility]. Up to b/r, steady gait, tolerated well, but had DOE, mild increased wob with ambulation off O2/Bipap, back to stretcher, returned to [place], reports still feeling better. Family at [facility].

## 2022-02-26 NOTE — ED Notes (Signed)
Back from CT on bipap and monitor. Disconnected to go to b/r. Family at [facility]. VSS.

## 2022-02-26 NOTE — ED Notes (Signed)
Back from b/r, returned to monitor, VSS, "wants to eat", food at [facility], tolerated ambulation to b/r well, states, "does not feel exacerbated", placed [place] 4L in order to eat. Family at [facility].

## 2022-02-26 NOTE — ED Notes (Addendum)
IVF started/ infusing, BP soft/low, MAP 75, will continue to monitor, will keep on [place], and hold Bipap at this time. NAD, calm, resps e/u.

## 2022-02-26 NOTE — ED Notes (Signed)
Up to b/r, steady gait 

## 2022-02-26 NOTE — ED Notes (Signed)
EDP in to see, at BS.  

## 2022-02-27 DIAGNOSIS — J9621 Acute and chronic respiratory failure with hypoxia: Secondary | ICD-10-CM | POA: Diagnosis not present

## 2022-02-27 LAB — CBC
HCT: 28 % — ABNORMAL LOW (ref 36.0–46.0)
Hemoglobin: 8.5 g/dL — ABNORMAL LOW (ref 12.0–15.0)
MCH: 24.4 pg — ABNORMAL LOW (ref 26.0–34.0)
MCHC: 30.4 g/dL (ref 30.0–36.0)
MCV: 80.2 fL (ref 80.0–100.0)
Platelets: 231 10*3/uL (ref 150–400)
RBC: 3.49 MIL/uL — ABNORMAL LOW (ref 3.87–5.11)
RDW: 17.8 % — ABNORMAL HIGH (ref 11.5–15.5)
WBC: 5.3 10*3/uL (ref 4.0–10.5)
nRBC: 0 % (ref 0.0–0.2)

## 2022-02-27 LAB — BASIC METABOLIC PANEL
Anion gap: 5 (ref 5–15)
BUN: 17 mg/dL (ref 8–23)
CO2: 27 mmol/L (ref 22–32)
Calcium: 9.3 mg/dL (ref 8.9–10.3)
Chloride: 107 mmol/L (ref 98–111)
Creatinine, Ser: 0.53 mg/dL (ref 0.44–1.00)
GFR, Estimated: >60 mL/min (ref >60)
Glucose, Bld: 131 mg/dL — ABNORMAL HIGH (ref 70–99)
Potassium: 4.6 mmol/L (ref 3.5–5.1)
Sodium: 139 mmol/L (ref 135–145)

## 2022-02-27 MED ORDER — AZITHROMYCIN 250 MG PO TABS
500.0000 mg | ORAL_TABLET | Freq: Every day | ORAL | Status: DC
  Administered 2022-02-28 – 2022-03-02 (×2): 500 mg via ORAL
  Filled 2022-02-27 (×3): qty 2

## 2022-02-27 MED ORDER — SODIUM CHLORIDE 0.9 % IV SOLN: INTRAVENOUS | Status: DC | PRN

## 2022-02-27 NOTE — Progress Notes (Signed)
PHARMACIST - PHYSICIAN COMMUNICATION  CONCERNING: Antibiotic IV to Oral Route Change Policy  RECOMMENDATION: This patient is receiving azithromycin by the intravenous route.  Based on criteria approved by the Pharmacy and Therapeutics Committee, the antibiotic(s) is/are being converted to the equivalent oral dose form(s).   DESCRIPTION: These criteria include: Patient being treated for a respiratory tract infection, urinary tract infection, cellulitis or clostridium difficile associated diarrhea if on metronidazole The patient is not neutropenic and does not exhibit a GI malabsorption state The patient is eating (either orally or via tube) and/or has been taking other orally administered medications for a least 24 hours The patient is improving clinically and has a Tmax < 100.5  If you have questions about this conversion, please contact the Pharmacy Department   Arlena Marsan B Tamaiya Bump  02/27/22    

## 2022-02-27 NOTE — Progress Notes (Signed)
PROGRESS NOTE    Julia [name]  TSV:[id number] DOB: Sep 19, 1960 DOA: 02/26/2022  PCP: Olena Leatherwood, FNP    Brief Narrative: This 61 years old female with PMH significant for COPD, chronic hypoxic respiratory failure on 2.5 L of supplemental oxygen at baseline, depression, ventral hernia and nicotine dependence presented in the ED for the evaluation of worsening shortness of breath.  Patient reports having productive cough, shortness of breath and wheezing for almost a month and was treated with a course of steroids without any significant improvement.  She woke up at 4 AM in the morning today  and was unable to breathe with diffuse wheezing.  She has used her nebulizer without any improvement, her pulse ox was 80% on 3 L of supplemental oxygen.  Patient presented in the ED and was given Solu-Medrol and magnesium sulfate.  She was placed on BiPAP, subsequently transitioned to 3 L of supplemental oxygen now.  Chest x-ray shows right lower lobe pneumonia.  Patient is admitted for acute on chronic hypoxic respiratory failure secondary to multifocal pneumonia and COPD exacerbation.  Assessment & Plan:   Principal Problem:   Acute on chronic respiratory failure (HCC) Active Problems:   CAP (community acquired pneumonia)   COPD with acute exacerbation (HCC)   Depression with anxiety   Nicotine dependence  Acute on chronic hypoxic respiratory failure: Likely multifactorial due to COPD exacerbation and multifocal pneumonia. At baseline patient uses 2.5 L of supplemental oxygen, She presented in the ER with respiratory distress with the use of accessory muscles and SPO2 80% on 2 L requiring BiPAP. Continue BiPAP as needed and as needed at night. Empirically started on ceftriaxone and Zithromax to complete 5-day treatment. Continue IV Solu-Medrol, scheduled and as needed nebulized bronchodilators. Continue antitussives to improve expectoration.  Community acquired pneumonia: Chest x-ray  shows left lower lobe infiltrate. Continue ceftriaxone and Zithromax for 5 days  COPD with acute exacerbation: Patient with history of COPD,  chronic respiratory failure presented with worsening shortness of breath and diffuse wheeze on exam.  Continue IV Solu-Medrol 40 mg every 12 hours.  Continue scheduled and as needed nebulized bronchodilators. Continue supplemental oxygen  Nicotine dependence Smoking cessation counseling completed. NicoDerm patch.  Major depression with anxiety Continue Zoloft and alprazolam.:   DVT prophylaxis:Lovenox Code Status: Full code Family Communication: No family at bed side. Disposition Plan:   Status is: Inpatient Remains inpatient appropriate because: Admitted for acute on chronic hypoxic respiratory failure secondary to COPD exacerbation and pneumonia requiring IV antibiotics.  She also requires BiPAP.  Consultants:  None  Procedures: None Antimicrobials: Ceftriaxone and Zithromax  Subjective: Patient was seen and examined at bedside.  Overnight events noted.   Patient remains on 3 L of supplemental oxygen.She reports doing better with treatment.  Objective: Vitals:   02/27/22 0013 02/27/22 0236 02/27/22 0315 02/27/22 0754  BP: (!) 152/81  (!) 152/82 118/66  Pulse: 63  66 63  Resp: 19  19 20   Temp: (!) 96.7 F (35.9 C)  (!) 96.8 F (36 C) 97.8 F (36.6 C)  TempSrc:    Axillary  SpO2: 99% 100% 99% 96%  Weight:      Height:        Intake/Output Summary (Last 24 hours) at 02/27/2022 1137 Last data filed at 02/27/2022 1047 Gross per 24 hour  Intake 683.53 ml  Output 1500 ml  Net -816.47 ml   Filed Weights   02/26/22 0638  Weight: 57.2 kg    Examination:  General  exam: Appears comfortable, not in any acute distress, remains on oxygen Respiratory system: Wheezing bilaterally, respiratory effort normal, no accessory muscle use, RR 17 Cardiovascular system: S1 & S2 heard, regular rate and rhythm, no murmur. Gastrointestinal  system: Abdomen is soft, nontender, nondistended, BS+ Central nervous system: Alert and oriented x 3. No focal neurological deficits. Extremities: No edema, no cyanosis, no clubbing Skin: No rashes, lesions or ulcers Psychiatry: Judgement and insight appear normal. Mood & affect appropriate.     Data Reviewed: I have personally reviewed following labs and imaging studies  CBC: Recent Labs  Lab 02/26/22 0640 02/27/22 0446  WBC 8.1 5.3  NEUTROABS 6.3  --   HGB 9.1* 8.5*  HCT 29.6* 28.0*  MCV 80.7 80.2  PLT 210 231   Basic Metabolic Panel: Recent Labs  Lab 02/26/22 0640 02/27/22 0446  NA 137 139  K 4.0 4.6  CL 102 107  CO2 27 27  GLUCOSE 155* 131*  BUN 12 17  CREATININE 0.57 0.53  CALCIUM 9.2 9.3   GFR: Estimated Creatinine Clearance: 65.6 mL/min (by C-G formula based on SCr of 0.53 mg/dL). Liver Function Tests: No results for input(s): "AST", "ALT", "ALKPHOS", "BILITOT", "PROT", "ALBUMIN" in the last 168 hours. No results for input(s): "LIPASE", "AMYLASE" in the last 168 hours. No results for input(s): "AMMONIA" in the last 168 hours. Coagulation Profile: No results for input(s): "INR", "PROTIME" in the last 168 hours. Cardiac Enzymes: No results for input(s): "CKTOTAL", "CKMB", "CKMBINDEX", "TROPONINI" in the last 168 hours. BNP (last 3 results) No results for input(s): "PROBNP" in the last 8760 hours. HbA1C: No results for input(s): "HGBA1C" in the last 72 hours. CBG: No results for input(s): "GLUCAP" in the last 168 hours. Lipid Profile: No results for input(s): "CHOL", "HDL", "LDLCALC", "TRIG", "CHOLHDL", "LDLDIRECT" in the last 72 hours. Thyroid Function Tests: No results for input(s): "TSH", "T4TOTAL", "FREET4", "T3FREE", "THYROIDAB" in the last 72 hours. Anemia Panel: No results for input(s): "VITAMINB12", "FOLATE", "FERRITIN", "TIBC", "IRON", "RETICCTPCT" in the last 72 hours. Sepsis Labs: Recent Labs  Lab 02/26/22 6295  LATICACIDVEN 0.6     Recent Results (from the past 240 hour(s))  SARS Coronavirus 2 by RT PCR (hospital order, performed in [facility] hospital lab) *cepheid single result test* Anterior Nasal Swab     Status: None   Collection Time: 02/26/22  9:30 AM   Specimen: Anterior Nasal Swab  Result Value Ref Range Status   SARS Coronavirus 2 by RT PCR NEGATIVE NEGATIVE Final    Comment: (NOTE) SARS-CoV-2 target nucleic acids are NOT DETECTED.  The SARS-CoV-2 RNA is generally detectable in upper and lower respiratory specimens during the acute phase of infection. The lowest concentration of SARS-CoV-2 viral copies this assay can detect is 250 copies / mL. A negative result does not preclude SARS-CoV-2 infection and should not be used as the sole basis for treatment or other patient management decisions.  A negative result may occur with improper specimen collection / handling, submission of specimen other than nasopharyngeal swab, presence of viral mutation(s) within the areas targeted by this assay, and inadequate number of viral copies (<250 copies / mL). A negative result must be combined with clinical observations, patient history, and epidemiological information.  Fact Sheet for Patients:   RoadLapTop.co.za  Fact Sheet for Healthcare Providers: http://kim-miller.com/  This test is not yet approved or  cleared by the [place] FDA and has been authorized for detection and/or diagnosis of SARS-CoV-2 by FDA under an Emergency Use Authorization (  EUA).  This EUA will remain in effect (meaning this test can be used) for the duration of the COVID-19 declaration under Section 564(b)(1) of the Act, 21 U.S.C. section 360bbb-3(b)(1), unless the authorization is terminated or revoked sooner.  Performed at [facility], [address], [place], [place] [address]     Radiology Studies: CT CHEST WO CONTRAST  Result Date: 02/26/2022 CLINICAL DATA:   Shortness of breath with finding of pneumonia on chest radiograph EXAM: CT CHEST WITHOUT CONTRAST TECHNIQUE: Multidetector CT imaging of the chest was performed following the standard protocol without IV contrast. RADIATION DOSE REDUCTION: This exam was performed according to the departmental dose-optimization program which includes automated exposure control, adjustment of the mA and/or kV according to patient size and/or use of iterative reconstruction technique. COMPARISON:  Chest radiograph dated 02/26/2022, CT chest dated 09/23/2021 FINDINGS: Cardiovascular: Normal heart size. No significant pericardial fluid/thickening. Aortic atherosclerosis. Great vessels are normal in course and caliber. Coronary artery and aortic valvular calcifications. Mediastinum/Nodes: Thyroid gland without nodules meeting criteria for imaging follow-up by size. Moderate hiatal hernia. Similar mediastinal lymphadenopathy measuring up to 19 mm precarinal (2:64) compared to 09/23/2021. Lungs/Pleura: The central airways are patent. Mild bronchial wall thickening. Multifocal tree-in-bud and consolidative opacities throughout the lungs. No pneumothorax. No pleural effusion. Upper abdomen: Normal. Musculoskeletal: No acute or abnormal lytic or blastic osseous lesions. Old healed sternal fracture deformity. Unchanged T11 anterior wedging deformity. Partially imaged ventral abdominal hernia containing liver and nonobstructed bowel loops. IMPRESSION: 1. Multifocal tree-in-bud and consolidative opacities throughout the lungs, consistent with multifocal aspiration or pneumonia. 2. Similar mediastinal lymphadenopathy, likely reactive. 3. Moderate hiatal hernia, which predisposes to aspiration. 4. Partially imaged ventral abdominal hernia containing liver and nonobstructed bowel loops. 5. Coronary artery disease.  Aortic Atherosclerosis (ICD10-I70.0). Electronically Signed   By: Darrin Nipper M.D.   On: 02/26/2022 10:03   DG Chest Port 1  View  Result Date: 02/26/2022 CLINICAL DATA:  Shortness of breath. EXAM: PORTABLE CHEST 1 VIEW COMPARISON:  09/28/2021 FINDINGS: Cardiac enlargement, stable. Hiatal hernia is unchanged from previous exam. There is chronic diffuse reticular and nodular interstitial markings identified throughout both lungs. Asymmetric opacification within the right lower lung is identified compatible pneumonia. IMPRESSION: 1. Right lower lung pneumonia. Short-term interval follow-up is advised to ensure resolution and to exclude underlying malignancy. 2. Diffuse reticular and nodular interstitial markings are noted within the remaining portions of the lungs which may reflect sequelae of chronic atypical inflammation or infection. 3. Hiatal hernia. Electronically Signed   By: Kerby Moors M.D.   On: 02/26/2022 07:27    Scheduled Meds:  enoxaparin (LOVENOX) injection  40 mg Subcutaneous Q24H   fluticasone furoate-vilanterol  1 puff Inhalation Daily   gabapentin  300 mg Oral TID   ipratropium-albuterol  3 mL Nebulization Q6H   magnesium oxide  400 mg Oral Daily   pantoprazole  40 mg Oral Daily   predniSONE  40 mg Oral Q breakfast   sertraline  150 mg Oral Daily   Continuous Infusions:  sodium chloride 10 mL/hr at 02/27/22 2778   azithromycin 500 mg (02/27/22 0848)   cefTRIAXone (ROCEPHIN)  IV 2 g (02/27/22 0621)     LOS: 1 day    Time spent: 50 mins    Treena Cosman, MD [facility]   If 7PM-7AM, please contact night-coverage

## 2022-02-28 DIAGNOSIS — J9621 Acute and chronic respiratory failure with hypoxia: Secondary | ICD-10-CM | POA: Diagnosis not present

## 2022-02-28 MED ORDER — ACETYLCYSTEINE 20 % IN SOLN
3.0000 mL | Freq: Two times a day (BID) | RESPIRATORY_TRACT | Status: DC
  Administered 2022-02-28 – 2022-03-02 (×4): 3 mL via RESPIRATORY_TRACT
  Filled 2022-02-28 (×5): qty 4

## 2022-02-28 NOTE — TOC Initial Note (Signed)
Transition of Care [facility]) - Initial/Assessment Note    Patient Details  Name: Julia [name] MRN: [id number] Date of Birth: October 27, 1960  [facility]) CM/SW Contact:    Candie Chroman, LCSW Phone Number: 02/28/2022, 1:28 PM  Clinical Narrative:  Readmission prevention screen complete. CSW met with patient. No supports at bedside. CSW introduced role and explained that discharge planning would be discussed. PCP is Sharyn Creamer, FNP at [facility] in [place]. She uses [facility]. No issues obtaining medications. No home health prior to admission. No DME use to get around but is on 2-3 L chronic oxygen through [place]. According to notes from last admission, they also supplied her an NIV in June. No further concerns. CSW encouraged patient to contact CSW as needed. CSW will continue to follow patient for support and facilitate return home when stable. Husband will transport her home at discharge.                Expected Discharge Plan: Home/Self Care Barriers to Discharge: Continued Medical Work up   Patient Goals and CMS Choice        Expected Discharge Plan and Services Expected Discharge Plan: Home/Self Care     Post Acute Care Choice: NA Living arrangements for the past 2 months: Single Family Home                                      Prior Living Arrangements/Services Living arrangements for the past 2 months: Single Family Home Lives with:: Spouse Patient language and need for interpreter reviewed:: Yes Do you feel safe going back to the place where you live?: Yes      Need for Family Participation in Patient Care: Yes (Comment) Care giver support system in place?: Yes (comment)   Criminal Activity/Legal Involvement Pertinent to Current Situation/Hospitalization: No - Comment as needed  Activities of Daily Living Home Assistive Devices/Equipment: None ADL Screening (condition at time of admission) Patient's cognitive ability adequate  to safely complete daily activities?: Yes Is the patient deaf or have difficulty hearing?: No Does the patient have difficulty seeing, even when wearing glasses/contacts?: No Does the patient have difficulty concentrating, remembering, or making decisions?: No Patient able to express need for assistance with ADLs?: Yes Does the patient have difficulty dressing or bathing?: No Independently performs ADLs?: Yes (appropriate for developmental age) Does the patient have difficulty walking or climbing stairs?: No Weakness of Legs: None Weakness of Arms/Hands: None  Permission Sought/Granted                  Emotional Assessment Appearance:: Appears stated age Attitude/Demeanor/Rapport: Gracious, Engaged Affect (typically observed): Accepting, Appropriate, Calm, Pleasant Orientation: : Oriented to Self, Oriented to Place, Oriented to  Time, Oriented to Situation Alcohol / Substance Use: Not Applicable Psych Involvement: No (comment)  Admission diagnosis:  COPD exacerbation (HCC) [J44.1] Acute on chronic respiratory failure (HCC) [J96.20] Acute hypoxemic respiratory failure ([place]) [J96.01] Community acquired pneumonia, unspecified laterality [J18.9] Patient Active Problem List   Diagnosis Date Noted   Acute on chronic respiratory failure ([place]) 02/26/2022   Pneumonia due to Streptococcus ([place]) 09/30/2021   Acute pulmonary edema ([place]) 09/30/2021   AKI (acute kidney injury) ([place]) 09/30/2021   CAP (community acquired pneumonia) 09/23/2021   Acute on chronic respiratory failure with hypoxia ([place]) 07/20/2021   Respiratory distress    COPD with acute exacerbation ([place]) 06/18/2021  Nicotine dependence 06/18/2021   Hypomagnesemia 06/18/2021   Elevated troponin 06/18/2021   Lung nodule 03/18/2021   Atypical pneumonia 03/18/2021   Severe sepsis ([place]) 03/18/2021   Depression with anxiety 03/18/2021   Iron deficiency anemia 03/18/2021   Incisional hernia, without obstruction or gangrene  04/22/2020   Acute respiratory failure with hypoxia ([place]) 11/08/2015   COPD exacerbation ([place]) 04/07/2015   PCP:  Romualdo Bolk, FNP Pharmacy:   [facility], [address] [place] [address] Phone: [phone] Fax: [place] [address] Phone: [phone] Fax: [phone]     Social Determinants of Health (SDOH) Interventions    Readmission Risk Interventions    02/28/2022    1:27 PM  Readmission Risk Prevention Plan  Transportation Screening Complete  PCP or Specialist Appt within 3-5 Days Complete  Social Work Consult for [place] Planning/Counseling Complete  Palliative Care Screening Not Applicable  Medication Review Press photographer) Complete

## 2022-02-28 NOTE — Progress Notes (Signed)
PROGRESS NOTE    Julia [name]  DXI:[id number] DOB: 10-Oct-1960 DOA: 02/26/2022  PCP: Olena Leatherwood, FNP    Brief Narrative: This 61 years old female with PMH significant for COPD, chronic hypoxic respiratory failure on 2.5 L of supplemental oxygen at baseline, depression, ventral hernia and nicotine dependence presented in the ED for the evaluation of worsening shortness of breath.  Patient reports having productive cough, shortness of breath and wheezing for almost a month and was treated with a course of steroids without any significant improvement.  She woke up at 4 AM in the morning today  and was unable to breathe with diffuse wheezing.  She has used her nebulizer without any improvement, her pulse ox was 80% on 3 L of supplemental oxygen.  Patient presented in the ED and was given Solu-Medrol and magnesium sulfate.  She was placed on BiPAP, subsequently transitioned to 3 L of supplemental oxygen now.  Chest x-ray shows right lower lobe pneumonia.  Patient is admitted for acute on chronic hypoxic respiratory failure secondary to multifocal pneumonia and COPD exacerbation.  Assessment & Plan:   Principal Problem:   Acute on chronic respiratory failure (HCC) Active Problems:   CAP (community acquired pneumonia)   COPD with acute exacerbation (HCC)   Depression with anxiety   Nicotine dependence  Acute on chronic hypoxic respiratory failure: Likely multifactorial due to COPD exacerbation and multifocal pneumonia. At baseline patient uses 2.5 L of supplemental oxygen, She presented in the ER with respiratory distress with the use of accessory muscles and SPO2 80% on 2 L requiring BiPAP.  Continue BiPAP as needed and at night. Empirically started on ceftriaxone and Zithromax to complete 5-day treatment. Continue IV Solu-Medrol, scheduled and as needed nebulized bronchodilators. Continue antitussives to improve expectoration.  Community acquired pneumonia: Chest x-ray shows left  lower lobe infiltrate. Continue ceftriaxone and Zithromax for 5 days.  COPD with acute exacerbation: Patient with history of COPD,  chronic respiratory failure presented with worsening shortness of breath and diffuse wheeze on exam.   Continue IV Solu-Medrol 40 mg every 12 hours.  Continue scheduled and as needed nebulized bronchodilators. Continue supplemental oxygen.  Nicotine dependence Smoking cessation counseling completed. NicoDerm patch.  Major depression with anxiety Continue Zoloft and alprazolam.   DVT prophylaxis:Lovenox Code Status: Full code Family Communication: No family at bed side. Disposition Plan:   Status is: Inpatient Remains inpatient appropriate because: Admitted for acute on chronic hypoxic respiratory failure secondary to COPD exacerbation and pneumonia requiring IV antibiotics.  She also requires BiPAP.  Anticipated discharge home in 1 to 2 days as she is weaned down to baseline.  Consultants:  None  Procedures: None Antimicrobials: Ceftriaxone and Zithromax  Subjective: Patient was seen and examined at bedside. Overnight events noted.   She remains on 3 L of supplemental oxygen, She reports doing better.   She has used BiPAP throughout the night.  Slept well.  Objective: Vitals:   02/28/22 0241 02/28/22 0455 02/28/22 0751 02/28/22 1130  BP:  110/68 124/76 113/63  Pulse: 74 63 (!) 58 64  Resp: (!) 23 18 19 19   Temp:  (!) 97.5 F (36.4 C) 97.6 F (36.4 C) 98 F (36.7 C)  TempSrc:      SpO2: 97% 98% 100% 97%  Weight:      Height:        Intake/Output Summary (Last 24 hours) at 02/28/2022 1212 Last data filed at 02/28/2022 1128 Gross per 24 hour  Intake 240 ml  Output 1700 ml  Net -1460 ml   Filed Weights   02/26/22 0638  Weight: 57.2 kg    Examination:  General exam: Appears comfortable, not in any acute distress, remains on 3.5 L oxygen. Respiratory system: Wheezing bilaterally, respiratory effort normal, no accessory muscle  use, RR 18 Cardiovascular system: S1 & S2 heard, regular rate and rhythm, no murmur. Gastrointestinal system: Abdomen is soft, non tender, non distended, BS+ Central nervous system: Alert and oriented x 3. No focal neurological deficits. Extremities: No edema, no cyanosis, no clubbing Skin: No rashes, lesions or ulcers Psychiatry: Judgement and insight appear normal. Mood & affect appropriate.     Data Reviewed: I have personally reviewed following labs and imaging studies  CBC: Recent Labs  Lab 02/26/22 0640 02/27/22 0446  WBC 8.1 5.3  NEUTROABS 6.3  --   HGB 9.1* 8.5*  HCT 29.6* 28.0*  MCV 80.7 80.2  PLT 210 231   Basic Metabolic Panel: Recent Labs  Lab 02/26/22 0640 02/27/22 0446  NA 137 139  K 4.0 4.6  CL 102 107  CO2 27 27  GLUCOSE 155* 131*  BUN 12 17  CREATININE 0.57 0.53  CALCIUM 9.2 9.3   GFR: Estimated Creatinine Clearance: 65.6 mL/min (by C-G formula based on SCr of 0.53 mg/dL). Liver Function Tests: No results for input(s): "AST", "ALT", "ALKPHOS", "BILITOT", "PROT", "ALBUMIN" in the last 168 hours. No results for input(s): "LIPASE", "AMYLASE" in the last 168 hours. No results for input(s): "AMMONIA" in the last 168 hours. Coagulation Profile: No results for input(s): "INR", "PROTIME" in the last 168 hours. Cardiac Enzymes: No results for input(s): "CKTOTAL", "CKMB", "CKMBINDEX", "TROPONINI" in the last 168 hours. BNP (last 3 results) No results for input(s): "PROBNP" in the last 8760 hours. HbA1C: No results for input(s): "HGBA1C" in the last 72 hours. CBG: No results for input(s): "GLUCAP" in the last 168 hours. Lipid Profile: No results for input(s): "CHOL", "HDL", "LDLCALC", "TRIG", "CHOLHDL", "LDLDIRECT" in the last 72 hours. Thyroid Function Tests: No results for input(s): "TSH", "T4TOTAL", "FREET4", "T3FREE", "THYROIDAB" in the last 72 hours. Anemia Panel: No results for input(s): "VITAMINB12", "FOLATE", "FERRITIN", "TIBC", "IRON",  "RETICCTPCT" in the last 72 hours. Sepsis Labs: Recent Labs  Lab 02/26/22 1610  LATICACIDVEN 0.6    Recent Results (from the past 240 hour(s))  SARS Coronavirus 2 by RT PCR (hospital order, performed in [facility] hospital lab) *cepheid single result test* Anterior Nasal Swab     Status: None   Collection Time: 02/26/22  9:30 AM   Specimen: Anterior Nasal Swab  Result Value Ref Range Status   SARS Coronavirus 2 by RT PCR NEGATIVE NEGATIVE Final    Comment: (NOTE) SARS-CoV-2 target nucleic acids are NOT DETECTED.  The SARS-CoV-2 RNA is generally detectable in upper and lower respiratory specimens during the acute phase of infection. The lowest concentration of SARS-CoV-2 viral copies this assay can detect is 250 copies / mL. A negative result does not preclude SARS-CoV-2 infection and should not be used as the sole basis for treatment or other patient management decisions.  A negative result may occur with improper specimen collection / handling, submission of specimen other than nasopharyngeal swab, presence of viral mutation(s) within the areas targeted by this assay, and inadequate number of viral copies (<250 copies / mL). A negative result must be combined with clinical observations, patient history, and epidemiological information.  Fact Sheet for Patients:   RoadLapTop.co.za  Fact Sheet for Healthcare Providers: http://kim-miller.com/  This test  is not yet approved or  cleared by the [place] and has been authorized for detection and/or diagnosis of SARS-CoV-2 by FDA under an Emergency Use Authorization (EUA).  This EUA will remain in effect (meaning this test can be used) for the duration of the COVID-19 declaration under Section 564(b)(1) of the Act, 21 U.S.C. section 360bbb-3(b)(1), unless the authorization is terminated or revoked sooner.  Performed at [facility], [address]  [place], [place] [address]     Radiology Studies: No results found.  Scheduled Meds:  azithromycin  500 mg Oral Daily   enoxaparin (LOVENOX) injection  40 mg Subcutaneous Q24H   fluticasone furoate-vilanterol  1 puff Inhalation Daily   gabapentin  300 mg Oral TID   ipratropium-albuterol  3 mL Nebulization Q6H   magnesium oxide  400 mg Oral Daily   pantoprazole  40 mg Oral Daily   predniSONE  40 mg Oral Q breakfast   sertraline  150 mg Oral Daily   Continuous Infusions:  sodium chloride 10 mL/hr at 02/28/22 0746   cefTRIAXone (ROCEPHIN)  IV 2 g (02/28/22 0747)     LOS: 2 days    Time spent: 35 mins  Aamina Skiff, MD [facility]   If 7PM-7AM, please contact night-coverage

## 2022-03-01 DIAGNOSIS — J9621 Acute and chronic respiratory failure with hypoxia: Secondary | ICD-10-CM | POA: Diagnosis not present

## 2022-03-01 LAB — MRSA NEXT GEN BY PCR, NASAL: MRSA by PCR Next Gen: NOT DETECTED

## 2022-03-01 MED ORDER — ORAL CARE MOUTH RINSE: 15.0000 mL | OROMUCOSAL | Status: DC | PRN

## 2022-03-01 NOTE — Progress Notes (Signed)
PROGRESS NOTE    Julia [name]  NID:[id number] DOB: [birth year] DOA: 02/26/2022  PCP: Romualdo Bolk, FNP    Brief Narrative: This 61 years old female with PMH significant for COPD, chronic hypoxic respiratory failure on 2.5 L of supplemental oxygen at baseline, depression, ventral hernia and nicotine dependence presented in the ED for the evaluation of worsening shortness of breath.  Patient reports having productive cough, shortness of breath and wheezing for almost a month and was treated with a course of steroids without any significant improvement.  She woke up at 4 AM in the morning today  and was unable to breathe with diffuse wheezing.  She has used her nebulizer without any improvement, her pulse ox was 80% on 3 L of supplemental oxygen.  Patient presented in the ED and was given Solu-Medrol and magnesium sulfate.  She was placed on BiPAP, subsequently transitioned to 3 L of supplemental oxygen now.  Chest x-ray shows right lower lobe pneumonia.  Patient is admitted for acute on chronic hypoxic respiratory failure secondary to multifocal pneumonia and COPD exacerbation.  Assessment & Plan:   Principal Problem:   Acute on chronic respiratory failure (HCC) Active Problems:   CAP (community acquired pneumonia)   COPD with acute exacerbation ([place])   Depression with anxiety   Nicotine dependence  Acute on chronic hypoxic respiratory failure: Likely multifactorial due to COPD exacerbation and multifocal pneumonia. At baseline patient uses 2.5 L of supplemental oxygen, She presented in the ER with respiratory distress with the use of accessory muscles and SPO2 80% on 2 L requiring BiPAP.  Continue BiPAP as needed and at night. Empirically started on ceftriaxone and Zithromax to complete 5-day treatment. Continue IV Solu-Medrol, scheduled and as needed nebulized bronchodilators. Continue antitussives to improve expectoration.  Community acquired pneumonia: Chest x-ray shows left  lower lobe infiltrate. Continue ceftriaxone and Zithromax for 5 days.  COPD with acute exacerbation: Patient with history of COPD,  chronic respiratory failure presented with worsening shortness of breath and diffuse wheeze on exam.   Continue IV Solu-Medrol 40 mg every 12 hours.  Continue scheduled and as needed nebulized bronchodilators. Continue supplemental oxygen.  Nicotine dependence Smoking cessation counseling completed. NicoDerm patch.  Major depression with anxiety Continue Zoloft and alprazolam.   DVT prophylaxis:Lovenox Code Status: Full code Family Communication: No family at bed side. Disposition Plan:   Status is: Inpatient Remains inpatient appropriate because: Admitted for acute on chronic hypoxic respiratory failure secondary to COPD exacerbation and pneumonia requiring IV antibiotics.  She also requires BiPAP.  Anticipated discharge home in 1 to 2 days as she is weaned down to baseline.  Consultants:  None  Procedures: None Antimicrobials: Ceftriaxone and Zithromax  Subjective: Patient was seen and examined at bedside. Overnight events noted.   Patient remains on 3 L of supplemental oxygen and states she reports doing better. She has used BiPAP throughout the night.  She slept well.  Objective: Vitals:   03/01/22 0345 03/01/22 0759 03/01/22 0801 03/01/22 1239  BP: 129/77 120/66  111/74  Pulse: 60 (!) 57 68 65  Resp: 19 (!) 22 18 20   Temp: 97.6 F (36.4 C) (!) 97.5 F (36.4 C)  97.8 F (36.6 C)  TempSrc:      SpO2: 96% 100% 98% 100%  Weight:      Height:        Intake/Output Summary (Last 24 hours) at 03/01/2022 1303 Last data filed at 03/01/2022 1031 Gross per 24 hour  Intake 409.45 ml  Output 1650 ml  Net -1240.55 ml   Filed Weights   02/26/22 0638  Weight: 57.2 kg    Examination:  General exam: Appears comfortable, NAD, remains on 3.5 L of supplemental oxygen. Respiratory system: Wheezing bilaterally, respiratory effort normal,  RR 18. Cardiovascular system: S1 & S2 heard, regular rate and rhythm, no murmur. Gastrointestinal system: Abdomen is soft, non tender, non distended, BS+ Central nervous system: Alert and oriented x 3. No focal neurological deficits. Extremities: No edema, no cyanosis, no clubbing Skin: No rashes, lesions or ulcers Psychiatry: Judgement and insight appear normal. Mood & affect appropriate.     Data Reviewed: I have personally reviewed following labs and imaging studies  CBC: Recent Labs  Lab 02/26/22 0640 02/27/22 0446  WBC 8.1 5.3  NEUTROABS 6.3  --   HGB 9.1* 8.5*  HCT 29.6* 28.0*  MCV 80.7 80.2  PLT 210 231   Basic Metabolic Panel: Recent Labs  Lab 02/26/22 0640 02/27/22 0446  NA 137 139  K 4.0 4.6  CL 102 107  CO2 27 27  GLUCOSE 155* 131*  BUN 12 17  CREATININE 0.57 0.53  CALCIUM 9.2 9.3   GFR: Estimated Creatinine Clearance: 65.6 mL/min (by C-G formula based on SCr of 0.53 mg/dL). Liver Function Tests: No results for input(s): "AST", "ALT", "ALKPHOS", "BILITOT", "PROT", "ALBUMIN" in the last 168 hours. No results for input(s): "LIPASE", "AMYLASE" in the last 168 hours. No results for input(s): "AMMONIA" in the last 168 hours. Coagulation Profile: No results for input(s): "INR", "PROTIME" in the last 168 hours. Cardiac Enzymes: No results for input(s): "CKTOTAL", "CKMB", "CKMBINDEX", "TROPONINI" in the last 168 hours. BNP (last 3 results) No results for input(s): "PROBNP" in the last 8760 hours. HbA1C: No results for input(s): "HGBA1C" in the last 72 hours. CBG: No results for input(s): "GLUCAP" in the last 168 hours. Lipid Profile: No results for input(s): "CHOL", "HDL", "LDLCALC", "TRIG", "CHOLHDL", "LDLDIRECT" in the last 72 hours. Thyroid Function Tests: No results for input(s): "TSH", "T4TOTAL", "FREET4", "T3FREE", "THYROIDAB" in the last 72 hours. Anemia Panel: No results for input(s): "VITAMINB12", "FOLATE", "FERRITIN", "TIBC", "IRON",  "RETICCTPCT" in the last 72 hours. Sepsis Labs: Recent Labs  Lab 02/26/22 4462  LATICACIDVEN 0.6    Recent Results (from the past 240 hour(s))  SARS Coronavirus 2 by RT PCR (hospital order, performed in [facility] hospital lab) *cepheid single result test* Anterior Nasal Swab     Status: None   Collection Time: 02/26/22  9:30 AM   Specimen: Anterior Nasal Swab  Result Value Ref Range Status   SARS Coronavirus 2 by RT PCR NEGATIVE NEGATIVE Final    Comment: (NOTE) SARS-CoV-2 target nucleic acids are NOT DETECTED.  The SARS-CoV-2 RNA is generally detectable in upper and lower respiratory specimens during the acute phase of infection. The lowest concentration of SARS-CoV-2 viral copies this assay can detect is 250 copies / mL. A negative result does not preclude SARS-CoV-2 infection and should not be used as the sole basis for treatment or other patient management decisions.  A negative result may occur with improper specimen collection / handling, submission of specimen other than nasopharyngeal swab, presence of viral mutation(s) within the areas targeted by this assay, and inadequate number of viral copies (<250 copies / mL). A negative result must be combined with clinical observations, patient history, and epidemiological information.  Fact Sheet for Patients:   RoadLapTop.co.za  Fact Sheet for Healthcare Providers: http://kim-miller.com/  This test is not yet approved or  cleared by the [place] and has been authorized for detection and/or diagnosis of SARS-CoV-2 by FDA under an Emergency Use Authorization (EUA).  This EUA will remain in effect (meaning this test can be used) for the duration of the COVID-19 declaration under Section 564(b)(1) of the Act, 21 U.S.C. section 360bbb-3(b)(1), unless the authorization is terminated or revoked sooner.  Performed at [facility], [address]  [place], [place] [address]     Radiology Studies: No results found.  Scheduled Meds:  acetylcysteine  3 mL Nebulization BID   azithromycin  500 mg Oral Daily   enoxaparin (LOVENOX) injection  40 mg Subcutaneous Q24H   fluticasone furoate-vilanterol  1 puff Inhalation Daily   gabapentin  300 mg Oral TID   ipratropium-albuterol  3 mL Nebulization Q6H   magnesium oxide  400 mg Oral Daily   pantoprazole  40 mg Oral Daily   predniSONE  40 mg Oral Q breakfast   sertraline  150 mg Oral Daily   Continuous Infusions:  sodium chloride 10 mL/hr at 03/01/22 0623   cefTRIAXone (ROCEPHIN)  IV 2 g (03/01/22 0623)     LOS: 3 days    Time spent: 35 mins  Ashlye Oviedo, MD [facility]   If 7PM-7AM, please contact night-coverage

## 2022-03-02 DIAGNOSIS — J9621 Acute and chronic respiratory failure with hypoxia: Secondary | ICD-10-CM | POA: Diagnosis not present

## 2022-03-02 MED ORDER — ALBUTEROL SULFATE HFA 108 (90 BASE) MCG/ACT IN AERS: 2.0000 | INHALATION_SPRAY | Freq: Four times a day (QID) | RESPIRATORY_TRACT | 2 refills | Status: DC | PRN

## 2022-03-02 MED ORDER — TRELEGY ELLIPTA 100-62.5-25 MCG/ACT IN AEPB: 1.0000 | INHALATION_SPRAY | Freq: Every day | RESPIRATORY_TRACT | 1 refills | Status: DC

## 2022-03-02 MED ORDER — PREDNISONE 10 MG (21) PO TBPK: ORAL_TABLET | ORAL | 0 refills | Status: DC

## 2022-03-02 NOTE — Discharge Summary (Signed)
Physician Discharge Summary  PETRONA WYETH GUY:[id number] DOB: 1961-02-05 DOA: 02/26/2022  PCP: Romualdo Bolk, FNP  Admit date: 02/26/2022  Discharge date: 03/02/2022  Admitted From: Home  Disposition:  Home.  Recommendations for Outpatient Follow-up:  Follow up with PCP in 1-2 weeks. Please obtain BMP/CBC in one week Advised to take prednisone taper as prescribed. Advised to continue CPAP as prescribed.  Home Health: None Equipment/Devices:Home oxygen / CPAP at night  Discharge Condition: Stable CODE STATUS:Full code Diet recommendation: Heart Healthy  [facility] Course: This 61 years old female with PMH significant for COPD, Chronic hypoxic respiratory failure on 2.5 L of supplemental oxygen at baseline, depression, ventral hernia and nicotine dependence presented in the ED for the evaluation of worsening shortness of breath.  Patient reports having productive cough, shortness of breath and wheezing for almost a month and was treated with a course of steroids without any significant improvement.  She woke up at 4 AM in the morning today  and was unable to breathe with diffuse wheezing.  She has used her nebulizer without any improvement, her pulse ox was 80% on 3 L of supplemental oxygen.  Patient presented in the ED and was given Solu-Medrol and magnesium sulfate.  She was placed on BiPAP, subsequently transitioned to 3 L of supplemental oxygen .  Chest x-ray shows right lower lobe pneumonia.  Patient was admitted for acute on chronic hypoxic respiratory failure secondary to multifocal pneumonia and COPD exacerbation.  Patient was continued on home inhalers, IV Solu-Medrol and empiric antibiotics.  She has made significant improvement after few days of IV antibiotics and breathing treatments.  She is weaned down to her baseline oxygen requirement.  She feels better and wants to be discharged.  Patient is being discharged home.  Discharge Diagnoses:  Principal  Problem:   Acute on chronic respiratory failure (HCC) Active Problems:   CAP (community acquired pneumonia)   COPD with acute exacerbation ([place])   Depression with anxiety   Nicotine dependence  Acute on chronic hypoxic respiratory failure: Likely multifactorial due to COPD exacerbation and multifocal pneumonia. At baseline patient uses 2.5 L of supplemental oxygen, She presented in the ER with respiratory distress with the use of accessory muscles and SPO2 80% on 2 L requiring BiPAP.  Continue BiPAP as needed and at night. Empirically started on ceftriaxone and Zithromax to complete 5-day treatment. Continue IV Solu-Medrol, scheduled and as needed nebulized bronchodilators. Continue antitussives to improve expectoration.   Community acquired pneumonia: Chest x-ray shows left lower lobe infiltrate. Continue ceftriaxone and Zithromax for 5 days. Discharged on 2 more days of Omnicef.   COPD with acute exacerbation: Patient with history of COPD,  chronic respiratory failure presented with worsening shortness of breath and diffuse wheeze on exam.   Continue IV Solu-Medrol 40 mg every 12 hours.  Continue scheduled and as needed nebulized bronchodilators. Continue supplemental oxygen.   Nicotine dependence Smoking cessation counseling completed. NicoDerm patch.   Major depression with anxiety Continue Zoloft and alprazolam.  Discharge Instructions  Discharge Instructions     Call MD for:  difficulty breathing, headache or visual disturbances   Complete by: As directed    Call MD for:  persistant dizziness or light-headedness   Complete by: As directed    Call MD for:  persistant nausea and vomiting   Complete by: As directed    Diet - low sodium heart healthy   Complete by: As directed    Discharge instructions   Complete by:  As directed    Advised to follow-up with primary care physician in 1 week. Advised to take prednisone taper as prescribed. Advised to continue CPAP as  prescribed.   Increase activity slowly   Complete by: As directed       Allergies as of 03/02/2022       Reactions   Banana Nausea And Vomiting        Medication List     TAKE these medications    acetaminophen 500 MG tablet Commonly known as: TYLENOL Take 1,000 mg by mouth every 6 (six) hours as needed.   albuterol (2.5 MG/3ML) 0.083% nebulizer solution Commonly known as: PROVENTIL Take 2.5 mg by nebulization every 6 (six) hours as needed for wheezing or shortness of breath.   albuterol 108 (90 Base) MCG/ACT inhaler Commonly known as: VENTOLIN HFA Inhale 2 puffs into the lungs every 6 (six) hours as needed for wheezing or shortness of breath.   ALPRAZolam 0.5 MG tablet Commonly known as: XANAX Take 0.5 mg by mouth 2 (two) times daily as needed.   dextromethorphan-guaiFENesin 30-600 MG 12hr tablet Commonly known as: MUCINEX DM Take 1 tablet by mouth 2 (two) times daily.   docusate sodium 100 MG capsule Commonly known as: COLACE Take 100 mg by mouth daily as needed for mild constipation.   ferrous sulfate 325 (65 FE) MG tablet Take 1 tablet (325 mg total) by mouth daily.   gabapentin 300 MG capsule Commonly known as: NEURONTIN Take 300 mg by mouth 3 (three) times daily.   guaiFENesin 100 MG/5ML liquid Commonly known as: ROBITUSSIN Take 5 mLs by mouth every 4 (four) hours as needed for cough or to loosen phlegm.   ipratropium-albuterol 0.5-2.5 (3) MG/3ML Soln Commonly known as: DUONEB Take 3 mLs by nebulization every 4 (four) hours as needed.   magnesium oxide 400 MG tablet Commonly known as: MAG-OX Take 400 mg by mouth daily.   pantoprazole 40 MG tablet Commonly known as: PROTONIX Take 40 mg by mouth daily.   predniSONE 10 MG (21) Tbpk tablet Commonly known as: STERAPRED UNI-PAK 21 TAB Take as directed.   sertraline 100 MG tablet Commonly known as: ZOLOFT Take 150 mg by mouth daily.   Trelegy Ellipta 100-62.5-25 MCG/ACT Aepb Generic drug:  Fluticasone-Umeclidin-Vilant Inhale 1 puff into the lungs daily.   Varenicline Tartrate (Starter) 0.5 MG X 11 & 1 MG X 42 Tbpk Take one 0.5mg  tab once daily for 3 days,then increase to one 0.5mg  tab twice daily for 4 days,then increase to one 1mg  tab twice daily.        Follow-up Information     Olena LeatherwoodFarrug, Eugene D, FNP. Go in 1 week(s).   Specialty: Nurse Practitioner Why: Appointment on Wednesday, 03/10/2022 at 9:40am. Please bring your discharge paperwork with you to the appointment. Contact information: [address] [place] [id number] [phone]                Allergies  Allergen Reactions   Banana Nausea And Vomiting    Consultations: None   Procedures/Studies: CT CHEST WO CONTRAST  Result Date: 02/26/2022 CLINICAL DATA:  Shortness of breath with finding of pneumonia on chest radiograph EXAM: CT CHEST WITHOUT CONTRAST TECHNIQUE: Multidetector CT imaging of the chest was performed following the standard protocol without IV contrast. RADIATION DOSE REDUCTION: This exam was performed according to the departmental dose-optimization program which includes automated exposure control, adjustment of the mA and/or kV according to patient size and/or use of iterative reconstruction technique. COMPARISON:  Chest radiograph dated 02/26/2022, CT chest dated 09/23/2021 FINDINGS: Cardiovascular: Normal heart size. No significant pericardial fluid/thickening. Aortic atherosclerosis. Great vessels are normal in course and caliber. Coronary artery and aortic valvular calcifications. Mediastinum/Nodes: Thyroid gland without nodules meeting criteria for imaging follow-up by size. Moderate hiatal hernia. Similar mediastinal lymphadenopathy measuring up to 19 mm precarinal (2:64) compared to 09/23/2021. Lungs/Pleura: The central airways are patent. Mild bronchial wall thickening. Multifocal tree-in-bud and consolidative opacities throughout the lungs. No pneumothorax. No pleural effusion.  Upper abdomen: Normal. Musculoskeletal: No acute or abnormal lytic or blastic osseous lesions. Old healed sternal fracture deformity. Unchanged T11 anterior wedging deformity. Partially imaged ventral abdominal hernia containing liver and nonobstructed bowel loops. IMPRESSION: 1. Multifocal tree-in-bud and consolidative opacities throughout the lungs, consistent with multifocal aspiration or pneumonia. 2. Similar mediastinal lymphadenopathy, likely reactive. 3. Moderate hiatal hernia, which predisposes to aspiration. 4. Partially imaged ventral abdominal hernia containing liver and nonobstructed bowel loops. 5. Coronary artery disease.  Aortic Atherosclerosis (ICD10-I70.0). Electronically Signed   By: Agustin Cree M.D.   On: 02/26/2022 10:03   DG Chest Port 1 View  Result Date: 02/26/2022 CLINICAL DATA:  Shortness of breath. EXAM: PORTABLE CHEST 1 VIEW COMPARISON:  09/28/2021 FINDINGS: Cardiac enlargement, stable. Hiatal hernia is unchanged from previous exam. There is chronic diffuse reticular and nodular interstitial markings identified throughout both lungs. Asymmetric opacification within the right lower lung is identified compatible pneumonia. IMPRESSION: 1. Right lower lung pneumonia. Short-term interval follow-up is advised to ensure resolution and to exclude underlying malignancy. 2. Diffuse reticular and nodular interstitial markings are noted within the remaining portions of the lungs which may reflect sequelae of chronic atypical inflammation or infection. 3. Hiatal hernia. Electronically Signed   By: Signa Kell M.D.   On: 02/26/2022 07:27   CT ABDOMEN PELVIS WO CONTRAST  Result Date: 02/04/2022 CLINICAL DATA:  Hernia, complicated. Abdominal pain. Patient reports hernia near umbilicus for 10 years, pain over the last year. Patient reports 2 prior hernia surgeries. EXAM: CT ABDOMEN AND PELVIS WITHOUT CONTRAST TECHNIQUE: Multidetector CT imaging of the abdomen and pelvis was performed following  the standard protocol without IV contrast. RADIATION DOSE REDUCTION: This exam was performed according to the departmental dose-optimization program which includes automated exposure control, adjustment of the mA and/or kV according to patient size and/or use of iterative reconstruction technique. COMPARISON:  Abdominopelvic CT 07/23/2020, chest CT 07/19/2021 and 09/23/2021 FINDINGS: Lower chest: Large hiatal hernia with approximately 50% of the stomach being intrathoracic. Occasional areas of scarring in the lower lobes. There are left lower lobe tree-in-bud opacities, also present on prior exam, although the previous multifocal pulmonary opacities have resolved where included. No pleural effusion. Hepatobiliary: No evidence of focal liver lesion on this unenhanced exam. Calcified gallstone within the gallbladder which is partially distended. No pericholecystic fat stranding or inflammation. No common bile duct dilatation. Pancreas: No ductal dilatation or inflammation. Spleen: Normal in size without focal abnormality. Adrenals/Urinary Tract: Normal adrenal glands. No hydronephrosis, no perinephric edema or renal calculi. No evidence of contour deforming renal mass. Partially distended urinary bladder, normal for degree of distension. Stomach/Bowel: 2 separate upper abdominal ventral abdominal wall hernias. Larger hernia is in the midline supraumbilical region with hernia neck spanning 10.3 x 10.7 cm (AP by CC). This hernia contains portions of the transverse colon as well as small bowel. No associated obstruction or inflammation. Left upper quadrant ventral abdominal wall hernia with hernia neck spanning 2.7 x 3.8 cm (AP by cc) contains distal transverse colon.  No associated obstruction or inflammation. Large hiatal hernia with approximately 50% of the stomach being intrathoracic. No small bowel dilatation or small bowel inflammation. Proximal jejunal small bowel diverticulum without inflammatory change. The cecum  is low-lying in the low pelvis. The appendix is normal. Moderate volume of colonic stool. Suspected chain sutures in the sigmoid. No colonic inflammation. No evidence of colonic mass. Vascular/Lymphatic: Advanced atherosclerosis of the abdominal aorta. No enlarged lymph nodes in the abdomen or pelvis. Reproductive: Unremarkable CT appearance of the uterus. There is a 2.4 cm cystic structure in the right adnexa that is stable from prior exam. Other: Upper abdominal ventral abdominal wall hernias as described above. No ascites. Musculoskeletal: Scoliosis and degenerative change in the lumbar spine. Multilevel degenerative change IMPRESSION: 1. Two separate upper abdominal ventral abdominal wall hernias. The larger hernia is in the midline supraumbilical region with hernia neck spanning 10.3 x 10.7 cm. This hernia contains portions of the transverse colon as well as small bowel. No associated obstruction or inflammation. 2. Separate left upper quadrant ventral abdominal wall hernia contains short segment of distal transverse colon, no obstruction or inflammation. 3. Large hiatal hernia with approximately 50% of the stomach being intrathoracic. 4. Cholelithiasis without CT findings of acute cholecystitis. 5. A 2.4 cm cystic structure in the right adnexa, is stable from 07/23/2020 abdominal CT. This is likely benign, and no specific imaging follow-up is recommended. 6. Overall improvement in the multifocal lung opacities from earlier this year. There is some residual tree-in-bud opacities in the left lower lobe likely representing infectious or inflammatory bronchiolitis. Aortic Atherosclerosis (ICD10-I70.0). Electronically Signed   By: Narda Rutherford M.D.   On: 02/04/2022 14:54     Subjective: Patient was seen and examined at bedside.  Overnight events noted.   Patient reports doing much better. She is back to her baseline oxygen requirement and wants to be discharged.  Discharge Exam: Vitals:   03/02/22 0743  03/02/22 1126  BP: 122/69 118/66  Pulse: 76 71  Resp: 17 17  Temp: 98.2 F (36.8 C) 98.3 F (36.8 C)  SpO2: 97% 96%   Vitals:   03/02/22 0153 03/02/22 0414 03/02/22 0743 03/02/22 1126  BP:  112/67 122/69 118/66  Pulse: 74 63 76 71  Resp:  15 17 17   Temp:  98.4 F (36.9 C) 98.2 F (36.8 C) 98.3 F (36.8 C)  TempSrc:  Oral Oral Oral  SpO2: 97% 98% 97% 96%  Weight:      Height:        General: Pt is alert, awake, not in acute distress Cardiovascular: RRR, S1/S2 +, no rubs, no gallops Respiratory: CTA bilaterally, no wheezing, no rhonchi Abdominal: Soft, NT, ND, bowel sounds + Extremities: no edema, no cyanosis    The results of significant diagnostics from this hospitalization (including imaging, microbiology, ancillary and laboratory) are listed below for reference.     Microbiology: Recent Results (from the past 240 hour(s))  SARS Coronavirus 2 by RT PCR (hospital order, performed in [facility] hospital lab) *cepheid single result test* Anterior Nasal Swab     Status: None   Collection Time: 02/26/22  9:30 AM   Specimen: Anterior Nasal Swab  Result Value Ref Range Status   SARS Coronavirus 2 by RT PCR NEGATIVE NEGATIVE Final    Comment: (NOTE) SARS-CoV-2 target nucleic acids are NOT DETECTED.  The SARS-CoV-2 RNA is generally detectable in upper and lower respiratory specimens during the acute phase of infection. The lowest concentration of SARS-CoV-2 viral copies this  assay can detect is 250 copies / mL. A negative result does not preclude SARS-CoV-2 infection and should not be used as the sole basis for treatment or other patient management decisions.  A negative result may occur with improper specimen collection / handling, submission of specimen other than nasopharyngeal swab, presence of viral mutation(s) within the areas targeted by this assay, and inadequate number of viral copies (<250 copies / mL). A negative result must be combined with  clinical observations, patient history, and epidemiological information.  Fact Sheet for Patients:   RoadLapTop.co.za  Fact Sheet for Healthcare Providers: http://kim-miller.com/  This test is not yet approved or  cleared by the [place] FDA and has been authorized for detection and/or diagnosis of SARS-CoV-2 by FDA under an Emergency Use Authorization (EUA).  This EUA will remain in effect (meaning this test can be used) for the duration of the COVID-19 declaration under Section 564(b)(1) of the Act, 21 U.S.C. section 360bbb-3(b)(1), unless the authorization is terminated or revoked sooner.  Performed at [facility], [address] [place], [place] [address]   MRSA Next Gen by PCR, Nasal     Status: None   Collection Time: 03/01/22  9:15 PM   Specimen: Nasal Mucosa; Nasal Swab  Result Value Ref Range Status   MRSA by PCR Next Gen NOT DETECTED NOT DETECTED Final    Comment: (NOTE) The GeneXpert MRSA Assay (FDA approved for NASAL specimens only), is one component of a comprehensive MRSA colonization surveillance program. It is not intended to diagnose MRSA infection nor to guide or monitor treatment for MRSA infections. Test performance is not FDA approved in patients less than 22 years old. Performed at [facility] [address] [place], [place] [address]      Labs: BNP (last 3 results) Recent Labs    07/19/21 0719 09/30/21 0439 02/26/22 0640  BNP 77.2 91.8 82.9   Basic Metabolic Panel: Recent Labs  Lab 02/26/22 0640 02/27/22 0446  NA 137 139  K 4.0 4.6  CL 102 107  CO2 27 27  GLUCOSE 155* 131*  BUN 12 17  CREATININE 0.57 0.53  CALCIUM 9.2 9.3   Liver Function Tests: No results for input(s): "AST", "ALT", "ALKPHOS", "BILITOT", "PROT", "ALBUMIN" in the last 168 hours. No results for input(s): "LIPASE", "AMYLASE" in the last 168 hours. No results for input(s): "AMMONIA" in the last  168 hours. CBC: Recent Labs  Lab 02/26/22 0640 02/27/22 0446  WBC 8.1 5.3  NEUTROABS 6.3  --   HGB 9.1* 8.5*  HCT 29.6* 28.0*  MCV 80.7 80.2  PLT 210 231   Cardiac Enzymes: No results for input(s): "CKTOTAL", "CKMB", "CKMBINDEX", "TROPONINI" in the last 168 hours. BNP: Invalid input(s): "POCBNP" CBG: No results for input(s): "GLUCAP" in the last 168 hours. D-Dimer No results for input(s): "DDIMER" in the last 72 hours. Hgb A1c No results for input(s): "HGBA1C" in the last 72 hours. Lipid Profile No results for input(s): "CHOL", "HDL", "LDLCALC", "TRIG", "CHOLHDL", "LDLDIRECT" in the last 72 hours. Thyroid function studies No results for input(s): "TSH", "T4TOTAL", "T3FREE", "THYROIDAB" in the last 72 hours.  Invalid input(s): "FREET3" Anemia work up No results for input(s): "VITAMINB12", "FOLATE", "FERRITIN", "TIBC", "IRON", "RETICCTPCT" in the last 72 hours. Urinalysis    Component Value Date/Time   COLORURINE YELLOW (A) 09/23/2021 1424   APPEARANCEUR CLEAR (A) 09/23/2021 1424   LABSPEC 1.023 09/23/2021 1424   PHURINE 5.0 09/23/2021 1424   GLUCOSEU 50 (A) 09/23/2021 1424  HGBUR NEGATIVE 09/23/2021 1424   BILIRUBINUR NEGATIVE 09/23/2021 1424   KETONESUR NEGATIVE 09/23/2021 1424   PROTEINUR NEGATIVE 09/23/2021 1424   NITRITE NEGATIVE 09/23/2021 1424   LEUKOCYTESUR NEGATIVE 09/23/2021 1424   Sepsis Labs Recent Labs  Lab 02/26/22 0640 02/27/22 0446  WBC 8.1 5.3   Microbiology Recent Results (from the past 240 hour(s))  SARS Coronavirus 2 by RT PCR (hospital order, performed in [facility] hospital lab) *cepheid single result test* Anterior Nasal Swab     Status: None   Collection Time: 02/26/22  9:30 AM   Specimen: Anterior Nasal Swab  Result Value Ref Range Status   SARS Coronavirus 2 by RT PCR NEGATIVE NEGATIVE Final    Comment: (NOTE) SARS-CoV-2 target nucleic acids are NOT DETECTED.  The SARS-CoV-2 RNA is generally detectable in upper and  lower respiratory specimens during the acute phase of infection. The lowest concentration of SARS-CoV-2 viral copies this assay can detect is 250 copies / mL. A negative result does not preclude SARS-CoV-2 infection and should not be used as the sole basis for treatment or other patient management decisions.  A negative result may occur with improper specimen collection / handling, submission of specimen other than nasopharyngeal swab, presence of viral mutation(s) within the areas targeted by this assay, and inadequate number of viral copies (<250 copies / mL). A negative result must be combined with clinical observations, patient history, and epidemiological information.  Fact Sheet for Patients:   RoadLapTop.co.za  Fact Sheet for Healthcare Providers: http://kim-miller.com/  This test is not yet approved or  cleared by the [place] FDA and has been authorized for detection and/or diagnosis of SARS-CoV-2 by FDA under an Emergency Use Authorization (EUA).  This EUA will remain in effect (meaning this test can be used) for the duration of the COVID-19 declaration under Section 564(b)(1) of the Act, 21 U.S.C. section 360bbb-3(b)(1), unless the authorization is terminated or revoked sooner.  Performed at [facility], [address] [place], [place] [address]   MRSA Next Gen by PCR, Nasal     Status: None   Collection Time: 03/01/22  9:15 PM   Specimen: Nasal Mucosa; Nasal Swab  Result Value Ref Range Status   MRSA by PCR Next Gen NOT DETECTED NOT DETECTED Final    Comment: (NOTE) The GeneXpert MRSA Assay (FDA approved for NASAL specimens only), is one component of a comprehensive MRSA colonization surveillance program. It is not intended to diagnose MRSA infection nor to guide or monitor treatment for MRSA infections. Test performance is not FDA approved in patients less than 57 years old. Performed at [facility], [address], [place], [place] [address]      Time coordinating discharge: Over 30 minutes  SIGNED:   Cipriano Bunker, MD  [facility] 03/02/2022, 3:52 PM Pager   If 7PM-7AM, please contact night-coverage

## 2022-03-02 NOTE — Discharge Instructions (Signed)
Advised to follow-up with primary care physician in 1 week. Advised to take prednisone taper as prescribed. Advised to continue CPAP as prescribed.

## 2022-03-07 DIAGNOSIS — J449 Chronic obstructive pulmonary disease, unspecified: Secondary | ICD-10-CM | POA: Diagnosis not present

## 2022-03-10 DIAGNOSIS — J9621 Acute and chronic respiratory failure with hypoxia: Secondary | ICD-10-CM | POA: Diagnosis not present

## 2022-03-10 DIAGNOSIS — J449 Chronic obstructive pulmonary disease, unspecified: Secondary | ICD-10-CM | POA: Diagnosis not present

## 2022-04-02 LAB — HISTOPLASMA GAL'MANNAN AG SER: Histoplasma Gal'mannan Ag Ser: <0.5 (ref <0.5)

## 2022-04-09 DIAGNOSIS — J9621 Acute and chronic respiratory failure with hypoxia: Secondary | ICD-10-CM | POA: Diagnosis not present

## 2022-04-09 DIAGNOSIS — J449 Chronic obstructive pulmonary disease, unspecified: Secondary | ICD-10-CM | POA: Diagnosis not present

## 2022-04-19 ENCOUNTER — Other Ambulatory Visit: Payer: Self-pay

## 2022-04-19 ENCOUNTER — Emergency Department: Payer: 59

## 2022-04-19 ENCOUNTER — Observation Stay
Admission: EM | Admit: 2022-04-19 | Discharge: 2022-04-20 | Disposition: A | Discharge status: Home / Self Care | Payer: 59 | Attending: Osteopathic Medicine | Admitting: Osteopathic Medicine

## 2022-04-19 ENCOUNTER — Encounter: Payer: Self-pay | Admitting: Emergency Medicine

## 2022-04-19 DIAGNOSIS — Z1152 Encounter for screening for COVID-19: Secondary | ICD-10-CM | POA: Diagnosis not present

## 2022-04-19 DIAGNOSIS — J45909 Unspecified asthma, uncomplicated: Secondary | ICD-10-CM | POA: Insufficient documentation

## 2022-04-19 DIAGNOSIS — J441 Chronic obstructive pulmonary disease with (acute) exacerbation: Principal | ICD-10-CM | POA: Insufficient documentation

## 2022-04-19 DIAGNOSIS — R0602 Shortness of breath: Secondary | ICD-10-CM | POA: Diagnosis not present

## 2022-04-19 DIAGNOSIS — Z743 Need for continuous supervision: Secondary | ICD-10-CM | POA: Diagnosis not present

## 2022-04-19 DIAGNOSIS — Z79899 Other long term (current) drug therapy: Secondary | ICD-10-CM | POA: Diagnosis not present

## 2022-04-19 DIAGNOSIS — J210 Acute bronchiolitis due to respiratory syncytial virus: Secondary | ICD-10-CM | POA: Diagnosis not present

## 2022-04-19 DIAGNOSIS — R062 Wheezing: Secondary | ICD-10-CM | POA: Diagnosis not present

## 2022-04-19 DIAGNOSIS — J449 Chronic obstructive pulmonary disease, unspecified: Secondary | ICD-10-CM | POA: Diagnosis present

## 2022-04-19 DIAGNOSIS — K449 Diaphragmatic hernia without obstruction or gangrene: Secondary | ICD-10-CM | POA: Diagnosis not present

## 2022-04-19 DIAGNOSIS — J439 Emphysema, unspecified: Secondary | ICD-10-CM | POA: Diagnosis not present

## 2022-04-19 LAB — BASIC METABOLIC PANEL
Anion gap: 7 (ref 5–15)
BUN: 21 mg/dL (ref 8–23)
CO2: 28 mmol/L (ref 22–32)
Calcium: 9.7 mg/dL (ref 8.9–10.3)
Chloride: 106 mmol/L (ref 98–111)
Creatinine, Ser: 0.58 mg/dL (ref 0.44–1.00)
GFR, Estimated: >60 mL/min (ref >60)
Glucose, Bld: 96 mg/dL (ref 70–99)
Potassium: 4.2 mmol/L (ref 3.5–5.1)
Sodium: 141 mmol/L (ref 135–145)

## 2022-04-19 LAB — CBC
HCT: 36.2 % (ref 36.0–46.0)
Hemoglobin: 11.2 g/dL — ABNORMAL LOW (ref 12.0–15.0)
MCH: 25.7 pg — ABNORMAL LOW (ref 26.0–34.0)
MCHC: 30.9 g/dL (ref 30.0–36.0)
MCV: 83 fL (ref 80.0–100.0)
Platelets: 145 10*3/uL — ABNORMAL LOW (ref 150–400)
RBC: 4.36 MIL/uL (ref 3.87–5.11)
RDW: 16.3 % — ABNORMAL HIGH (ref 11.5–15.5)
WBC: 7.2 10*3/uL (ref 4.0–10.5)
nRBC: 0 % (ref 0.0–0.2)

## 2022-04-19 LAB — TROPONIN I (HIGH SENSITIVITY)
Troponin I (High Sensitivity): 17 ng/L (ref <18)
Troponin I (High Sensitivity): 107 ng/L (ref <18)

## 2022-04-19 LAB — RESP PANEL BY RT-PCR (RSV, FLU A&B, COVID)  RVPGX2
Influenza A by PCR: NEGATIVE
Influenza B by PCR: NEGATIVE
Resp Syncytial Virus by PCR: POSITIVE — AB
SARS Coronavirus 2 by RT PCR: NEGATIVE

## 2022-04-19 MED ORDER — IPRATROPIUM-ALBUTEROL 0.5-2.5 (3) MG/3ML IN SOLN
3.0000 mL | Freq: Once | RESPIRATORY_TRACT | Status: AC
  Administered 2022-04-19: 3 mL via RESPIRATORY_TRACT
  Filled 2022-04-19: qty 3

## 2022-04-19 MED ORDER — IPRATROPIUM-ALBUTEROL 0.5-2.5 (3) MG/3ML IN SOLN
3.0000 mL | Freq: Four times a day (QID) | RESPIRATORY_TRACT | Status: DC
  Administered 2022-04-19 – 2022-04-20 (×5): 3 mL via RESPIRATORY_TRACT
  Filled 2022-04-19 (×5): qty 3

## 2022-04-19 MED ORDER — DOCUSATE SODIUM 100 MG PO CAPS: 100.0000 mg | ORAL_CAPSULE | Freq: Every day | ORAL | Status: DC | PRN

## 2022-04-19 MED ORDER — ALPRAZOLAM 0.5 MG PO TABS
0.5000 mg | ORAL_TABLET | Freq: Two times a day (BID) | ORAL | Status: DC | PRN
  Administered 2022-04-19 – 2022-04-20 (×2): 0.5 mg via ORAL
  Filled 2022-04-19 (×2): qty 1

## 2022-04-19 MED ORDER — MAGNESIUM OXIDE 400 MG PO TABS
400.0000 mg | ORAL_TABLET | Freq: Every day | ORAL | Status: DC
  Administered 2022-04-20: 400 mg via ORAL
  Filled 2022-04-19 (×2): qty 1

## 2022-04-19 MED ORDER — SERTRALINE HCL 50 MG PO TABS
150.0000 mg | ORAL_TABLET | Freq: Every day | ORAL | Status: DC
  Administered 2022-04-20: 150 mg via ORAL
  Filled 2022-04-19: qty 3

## 2022-04-19 MED ORDER — PANTOPRAZOLE SODIUM 40 MG PO TBEC
40.0000 mg | DELAYED_RELEASE_TABLET | Freq: Every day | ORAL | Status: DC
  Administered 2022-04-20: 40 mg via ORAL
  Filled 2022-04-19: qty 1

## 2022-04-19 MED ORDER — ASPIRIN 81 MG PO CHEW
324.0000 mg | CHEWABLE_TABLET | Freq: Once | ORAL | Status: AC
  Administered 2022-04-19: 324 mg via ORAL
  Filled 2022-04-19: qty 4

## 2022-04-19 MED ORDER — ENOXAPARIN SODIUM 40 MG/0.4ML IJ SOSY
40.0000 mg | PREFILLED_SYRINGE | INTRAMUSCULAR | Status: DC
  Administered 2022-04-19: 40 mg via SUBCUTANEOUS
  Filled 2022-04-19 (×2): qty 0.4

## 2022-04-19 MED ORDER — METHYLPREDNISOLONE SODIUM SUCC 125 MG IJ SOLR
125.0000 mg | Freq: Once | INTRAMUSCULAR | Status: AC
  Administered 2022-04-19: 125 mg via INTRAVENOUS
  Filled 2022-04-19: qty 2

## 2022-04-19 MED ORDER — PREDNISONE 20 MG PO TABS
50.0000 mg | ORAL_TABLET | Freq: Every day | ORAL | Status: DC
  Administered 2022-04-20: 50 mg via ORAL
  Filled 2022-04-19: qty 1

## 2022-04-19 MED ORDER — ALBUTEROL SULFATE (2.5 MG/3ML) 0.083% IN NEBU: 2.5000 mg | INHALATION_SOLUTION | RESPIRATORY_TRACT | Status: DC | PRN

## 2022-04-19 NOTE — H&P (Signed)
HISTORY AND PHYSICAL    Julia [name]   NID:[id number]  DOB: Jul 01, 1960   Date of Service: 04/19/22 Requesting physician/APP from ED: Treatment Team:  Attending Provider: Sunnie Nielsen, DO  PCP: Olena Leatherwood, FNP    Julia [name] is a 61 y.o. female with advanced COPD wearing 2.5 L oxygen at home presents to the ED early AM 04/19/2022 for worsening shortness of breath wheezing cough congestion.  Husband was sick with COVID last week.  She denies any nausea or vomiting.  Denies any chest pain.  Was found by EMS to be hypoxic to the 80s after she took her albuterol treatments this morning without improvement.  Was given another nebulizer treatment with improvement.  12/18: in ED, SpO2 87%, (+)RSV, Hgb 11.2, EKG no concerns. Tx: SoluMedrol 125 mg IV, DuoNeb x2. Keeping for obs to monitor given acute respiratory failure  Consultants:  none  Procedures: none      ASSESSMENT & PLAN:   Principal Problem:   COPD exacerbation (HCC)  COPD exacerbation Due to RSV Supportive care: antitussives O2 (baseline 2.5 L/min) to taper down as able  SoluMedrol --> prednisone Bronchodilators No antibiotics indicated at this time  If stable on baseline O2 requirement can anticipate discharge hopefully tomorrow AM  Tobacco abuse/dependence Declined nicotine patch  Anxiety Insomnia Home medications as below    DVT prophylaxis: lovenox  Pertinent IV fluids/nutrition: no continuous IV fluids, regular diet  Central lines / invasive devices: none  Code Status: FULL CODe Family Communication: husband at bedside  Disposition: observation  TOC needs: none Barriers to discharge / significant pending items: If stable on baseline O2 requirement can anticipate discharge hopefully tomorrow AM                         Review of Systems:  Review of Systems  Constitutional:  Positive for chills and malaise/fatigue. Negative for diaphoresis, fever and  weight loss.  HENT:  Negative for congestion, sinus pain and sore throat.   Respiratory:  Positive for cough, sputum production, shortness of breath and wheezing. Negative for hemoptysis.   Cardiovascular:  Negative for chest pain, palpitations, orthopnea, claudication and leg swelling.  Gastrointestinal:  Negative for abdominal pain, heartburn, nausea and vomiting.  Genitourinary:  Negative for dysuria, frequency and urgency.  Musculoskeletal:  Positive for back pain.  Skin:  Negative for rash.  Neurological:  Negative for dizziness, focal weakness and headaches.  Psychiatric/Behavioral:  Negative for substance abuse. The patient is nervous/anxious and has insomnia.        has a past medical history of Anemia, Anxiety, Asthma, COPD (chronic obstructive pulmonary disease) (HCC), Depression, and Ventral hernia. (Not in an outpatient encounter)  Current Meds  Medication Sig   acetaminophen (TYLENOL) 500 MG tablet Take 1,000 mg by mouth every 6 (six) hours as needed.   albuterol (PROVENTIL) (2.5 MG/3ML) 0.083% nebulizer solution Take 2.5 mg by nebulization every 6 (six) hours as needed for wheezing or shortness of breath.   albuterol (VENTOLIN HFA) 108 (90 Base) MCG/ACT inhaler Inhale 2 puffs into the lungs every 6 (six) hours as needed for wheezing or shortness of breath.   ALPRAZolam (XANAX) 0.5 MG tablet Take 0.5 mg by mouth 2 (two) times daily as needed.   dextromethorphan-guaiFENesin (MUCINEX DM) 30-600 MG 12hr tablet Take 1 tablet by mouth 2 (two) times daily.   docusate sodium (COLACE) 100 MG capsule Take 100 mg by mouth daily as needed for mild constipation.  ipratropium-albuterol (DUONEB) 0.5-2.5 (3) MG/3ML SOLN Take 3 mLs by nebulization every 4 (four) hours as needed.   magnesium oxide (MAG-OX) 400 MG tablet Take 400 mg by mouth daily.   pantoprazole (PROTONIX) 40 MG tablet Take 40 mg by mouth daily.   sertraline (ZOLOFT) 100 MG tablet Take 150 mg by mouth daily.   TRELEGY  ELLIPTA 100-62.5-25 MCG/ACT AEPB Inhale 1 puff into the lungs daily.    Allergies  Allergen Reactions   Banana Nausea And Vomiting    family history includes Alcohol abuse in her father; Sarcoidosis in her mother.  Past Surgical History:  Procedure Laterality Date   COLON SURGERY     EAR TUBE REMOVAL     FRACTURE SURGERY     HERNIA REPAIR     TUBAL LIGATION            Objective Findings:  Vitals:   04/19/22 0546 04/19/22 0839 04/19/22 0935 04/19/22 1030  BP:  120/70 113/77 93/70  Pulse:  60 (!) 58 63  Resp:  18 18 (!) 22  Temp:   98.2 F (36.8 C)   TempSrc:   Oral   SpO2: 96% 96% 95% 92%  Weight: 58.1 kg     Height: 5\' 5"  (1.651 m)      No intake or output data in the 24 hours ending 04/19/22 1147 Filed Weights   04/19/22 0546  Weight: 58.1 kg    Examination:  Physical Exam Constitutional:      General: She is not in acute distress.    Appearance: She is not toxic-appearing or diaphoretic.  HENT:     Head: Normocephalic and atraumatic.  Eyes:     Extraocular Movements: Extraocular movements intact.  Cardiovascular:     Rate and Rhythm: Normal rate and regular rhythm.  Pulmonary:     Effort: Pulmonary effort is normal.     Breath sounds: Decreased breath sounds and wheezing present.  Chest:     Chest wall: No mass or tenderness.  Abdominal:     Palpations: Abdomen is soft.     Tenderness: There is no abdominal tenderness. There is no guarding.  Musculoskeletal:     Cervical back: Neck supple.     Right lower leg: No edema.     Left lower leg: No edema.  Lymphadenopathy:     Cervical: No cervical adenopathy.  Skin:    General: Skin is warm and dry.  Neurological:     General: No focal deficit present.     Mental Status: She is alert and oriented to person, place, and time.  Psychiatric:        Mood and Affect: Mood normal.        Behavior: Behavior normal.          Data Reviewed: I have personally reviewed following labs and imaging  studies  CBC: Recent Labs  Lab 04/19/22 0551  WBC 7.2  HGB 11.2*  HCT 36.2  MCV 83.0  PLT [id number]*   Basic Metabolic Panel: Recent Labs  Lab 04/19/22 0551  NA 141  K 4.2  CL 106  CO2 28  GLUCOSE 96  BUN 21  CREATININE 0.58  CALCIUM 9.7   GFR: Estimated Creatinine Clearance: 65.6 mL/min (by C-G formula based on SCr of 0.58 mg/dL). Liver Function Tests: No results for input(s): "AST", "ALT", "ALKPHOS", "BILITOT", "PROT", "ALBUMIN" in the last 168 hours. No results for input(s): "LIPASE", "AMYLASE" in the last 168 hours. No results for input(s): "AMMONIA" in the last  168 hours. Coagulation Profile: No results for input(s): "INR", "PROTIME" in the last 168 hours. Cardiac Enzymes: No results for input(s): "CKTOTAL", "CKMB", "CKMBINDEX", "TROPONINI" in the last 168 hours. BNP (last 3 results) No results for input(s): "PROBNP" in the last 8760 hours. HbA1C: No results for input(s): "HGBA1C" in the last 72 hours. CBG: No results for input(s): "GLUCAP" in the last 168 hours. Lipid Profile: No results for input(s): "CHOL", "HDL", "LDLCALC", "TRIG", "CHOLHDL", "LDLDIRECT" in the last 72 hours. Thyroid Function Tests: No results for input(s): "TSH", "T4TOTAL", "FREET4", "T3FREE", "THYROIDAB" in the last 72 hours. Anemia Panel: No results for input(s): "VITAMINB12", "FOLATE", "FERRITIN", "TIBC", "IRON", "RETICCTPCT" in the last 72 hours. Most Recent Urinalysis On File:     Component Value Date/Time   COLORURINE YELLOW (A) 09/23/2021 1424   APPEARANCEUR CLEAR (A) 09/23/2021 1424   LABSPEC 1.023 09/23/2021 1424   PHURINE 5.0 09/23/2021 1424   GLUCOSEU 50 (A) 09/23/2021 1424   HGBUR NEGATIVE 09/23/2021 1424   BILIRUBINUR NEGATIVE 09/23/2021 1424   KETONESUR NEGATIVE 09/23/2021 1424   PROTEINUR NEGATIVE 09/23/2021 1424   NITRITE NEGATIVE 09/23/2021 1424   LEUKOCYTESUR NEGATIVE 09/23/2021 1424   Sepsis Labs: @LABRCNTIP (procalcitonin:4,lacticidven:4)  Recent Results (from  the past 240 hour(s))  Resp panel by RT-PCR (RSV, Flu A&B, Covid) Anterior Nasal Swab     Status: Abnormal   Collection Time: 04/19/22  5:51 AM   Specimen: Anterior Nasal Swab  Result Value Ref Range Status   SARS Coronavirus 2 by RT PCR NEGATIVE NEGATIVE Final    Comment: (NOTE) SARS-CoV-2 target nucleic acids are NOT DETECTED.  The SARS-CoV-2 RNA is generally detectable in upper respiratory specimens during the acute phase of infection. The lowest concentration of SARS-CoV-2 viral copies this assay can detect is 138 copies/mL. A negative result does not preclude SARS-Cov-2 infection and should not be used as the sole basis for treatment or other patient management decisions. A negative result may occur with  improper specimen collection/handling, submission of specimen other than nasopharyngeal swab, presence of viral mutation(s) within the areas targeted by this assay, and inadequate number of viral copies(<138 copies/mL). A negative result must be combined with clinical observations, patient history, and epidemiological information. The expected result is Negative.  Fact Sheet for Patients:  EntrepreneurPulse.com.au  Fact Sheet for Healthcare Providers:  IncredibleEmployment.be  This test is no t yet approved or cleared by the [place] FDA and  has been authorized for detection and/or diagnosis of SARS-CoV-2 by FDA under an Emergency Use Authorization (EUA). This EUA will remain  in effect (meaning this test can be used) for the duration of the COVID-19 declaration under Section 564(b)(1) of the Act, 21 U.S.C.section 360bbb-3(b)(1), unless the authorization is terminated  or revoked sooner.       Influenza A by PCR NEGATIVE NEGATIVE Final   Influenza B by PCR NEGATIVE NEGATIVE Final    Comment: (NOTE) The Xpert Xpress SARS-CoV-2/FLU/RSV plus assay is intended as an aid in the diagnosis of influenza from Nasopharyngeal swab  specimens and should not be used as a sole basis for treatment. Nasal washings and aspirates are unacceptable for Xpert Xpress SARS-CoV-2/FLU/RSV testing.  Fact Sheet for Patients: EntrepreneurPulse.com.au  Fact Sheet for Healthcare Providers: IncredibleEmployment.be  This test is not yet approved or cleared by the [place] FDA and has been authorized for detection and/or diagnosis of SARS-CoV-2 by FDA under an Emergency Use Authorization (EUA). This EUA will remain in effect (meaning this test can be used) for the duration of  the COVID-19 declaration under Section 564(b)(1) of the Act, 21 U.S.C. section 360bbb-3(b)(1), unless the authorization is terminated or revoked.     Resp Syncytial Virus by PCR POSITIVE (A) NEGATIVE Final    Comment: (NOTE) Fact Sheet for Patients: EntrepreneurPulse.com.au  Fact Sheet for Healthcare Providers: IncredibleEmployment.be  This test is not yet approved or cleared by the [place] FDA and has been authorized for detection and/or diagnosis of SARS-CoV-2 by FDA under an Emergency Use Authorization (EUA). This EUA will remain in effect (meaning this test can be used) for the duration of the COVID-19 declaration under Section 564(b)(1) of the Act, 21 U.S.C. section 360bbb-3(b)(1), unless the authorization is terminated or revoked.  Performed at [facility], [address], [place], [place] [address]          Radiology Studies: DG Chest 2 View  Result Date: 04/19/2022 CLINICAL DATA:  61 year old female with shortness of breath. EXAM: CHEST - 2 VIEW COMPARISON:  Chest CT 02/26/2022 and earlier. FINDINGS: PA and lateral views at 0601 hours. Evidence of emphysema on the October CT which also demonstrated widespread peribronchial and tree-in-bud bilateral pulmonary opacity. Compared to radiographs that month reticular interstitial opacity and more  confluent right lower lung peribronchial opacity has regressed. Underlying large lung volumes. Stable cardiac size and mediastinal contours. Moderate size hiatal hernia. No pneumothorax. No definite pleural effusion or new pulmonary opacity. Osteopenia. Stable visualized osseous structures. Chronic lower thoracic compression fracture. Negative visible bowel gas. 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 Improved ventilation compared to October. No new cardiopulmonary abnormality. Electronically Signed   By: Genevie Ann M.D.   On: 04/19/2022 06:47             LOS: 0 days       Emeterio Reeve, DO [facility] 04/19/2022, 11:47 AM    Dictation software may have been used to generate the above note. Typos may occur and escape review in typed/dictated notes. Please contact Dr Sheppard Coil directly for clarity if needed.  Staff may message me via secure chat in [place]  but this may not receive an immediate response,  please page me for urgent matters!  If 7PM-7AM, please contact night coverage [email or web address]

## 2022-04-19 NOTE — ED Notes (Signed)
See triage note  Presents with some increased SOB  States she tried to get up and noticed that she was more SOB  Did use her inhaler min relief

## 2022-04-19 NOTE — Hospital Course (Addendum)
Julia [name] is a 61 y.o. female with advanced COPD wearing 2.5 L oxygen at home presents to the ED early AM 04/19/2022 for worsening shortness of breath wheezing cough congestion.  Husband was sick with COVID last week.  She denies any nausea or vomiting.  Denies any chest pain.  Was found by EMS to be hypoxic to the 80s after she took her albuterol treatments this morning without improvement.  Was given another nebulizer treatment with improvement.  12/18: in ED, SpO2 87%, (+)RSV, Hgb 11.2, EKG no concerns. Tx: SoluMedrol 125 mg IV, DuoNeb x2. Keeping for obs to monitor given acute respiratory failure  Consultants:  none  Procedures: none      ASSESSMENT & PLAN:   Principal Problem:   COPD exacerbation (HCC)  COPD exacerbation Due to RSV Supportive care: antitussives O2 (baseline 2.5 L/min) to taper down as able  SoluMedrol --> prednisone Bronchodilators No antibiotics indicated at this time  If stable on baseline O2 requirement can anticipate discharge hopefully tomorrow AM  Tobacco abuse/dependence Declined nicotine patch  Anxiety Insomnia Home medications as below    DVT prophylaxis: lovenox  Pertinent IV fluids/nutrition: no continuous IV fluids, regular diet  Central lines / invasive devices: none  Code Status: FULL CODe Family Communication: husband at bedside  Disposition: observation  TOC needs: none Barriers to discharge / significant pending items: If stable on baseline O2 requirement can anticipate discharge hopefully tomorrow AM

## 2022-04-19 NOTE — ED Triage Notes (Signed)
Patient arrived via EMS. Reports history of COPD, wears 2 L oxygen during the night and on RA during the day with typical oxygen saturation of 96%. States she woke up with shortness of breath. Denies fever, reports chills. States husband had COVID last week. AOX4, slightly labored respirations while resting in chair.

## 2022-04-19 NOTE — ED Provider Notes (Addendum)
[facility] Provider Note    Event Date/Time   First MD Initiated Contact with Patient 04/19/22 0801     (approximate)   History   Shortness of Breath   HPI  Julia [name] is a 61 y.o. female with advanced COPD wearing 2.5 L oxygen at home presents to the ER for worsening shortness of breath wheezing cough congestion.  Husband was sick with COVID last week.  She denies any nausea or vomiting.  Denies any chest pain.  Was found by EMS to be hypoxic to the 80s after she took her albuterol treatments this morning without improvement.  Was given another nebulizer treatment with improvement.     Physical Exam   Triage Vital Signs: ED Triage Vitals  Enc Vitals Group     BP 04/19/22 0545 120/75     Pulse Rate 04/19/22 0545 (!) 58     Resp 04/19/22 0545 20     Temp 04/19/22 0545 98.2 F (36.8 C)     Temp Source 04/19/22 0545 Oral     SpO2 04/19/22 0545 (!) 87 %     Weight 04/19/22 0546 128 lb (58.1 kg)     Height 04/19/22 0546 5\' 5"  (1.651 m)     Head Circumference --      Peak Flow --      Pain Score 04/19/22 0546 0     Pain Loc --      Pain Edu? --      Excl. in GC? --     Most recent vital signs: Vitals:   04/19/22 0546 04/19/22 0839  BP:  120/70  Pulse:  60  Resp:  18  Temp:    SpO2: 96% 96%     Constitutional: Alert  Eyes: Conjunctivae are normal.  Head: Atraumatic. Nose: No congestion/rhinnorhea. Mouth/Throat: Mucous membranes are moist.   Neck: Painless ROM.  Cardiovascular:   Good peripheral circulation. Respiratory: mild tachypnea, diffuse wheeze Gastrointestinal: Soft and nontender.  Musculoskeletal:  no deformity Neurologic:  MAE spontaneously. No gross focal neurologic deficits are appreciated.  Skin:  Skin is warm, dry and intact. No rash noted. Psychiatric: Mood and affect are normal. Speech and behavior are normal.    ED Results / Procedures / Treatments   Labs (all labs ordered are listed, but only abnormal  results are displayed) Labs Reviewed  RESP PANEL BY RT-PCR (RSV, FLU A&B, COVID)  [id number] - Abnormal; Notable for the following components:      Result Value   Resp Syncytial Virus by PCR POSITIVE (*)    All other components within normal limits  CBC - Abnormal; Notable for the following components:   Hemoglobin 11.2 (*)    MCH 25.7 (*)    RDW 16.3 (*)    Platelets 145 (*)    All other components within normal limits  TROPONIN I (HIGH SENSITIVITY) - Abnormal; Notable for the following components:   Troponin I (High Sensitivity) 107 (*)    All other components within normal limits  BASIC METABOLIC PANEL  TROPONIN I (HIGH SENSITIVITY)     EKG  ED ECG REPORT I, 04/21/22, the attending physician, personally viewed and interpreted this ECG.   Date: 04/19/2022  EKG Time: 5:58  Rate: 60  Rhythm: sinus  Axis: right  Intervals: normal  ST&T Change: no stemi, no depressions    RADIOLOGY Please see ED Course for my review and interpretation.  I personally reviewed all radiographic images ordered to evaluate for the  above acute complaints and reviewed radiology reports and findings.  These findings were personally discussed with the patient.  Please see medical record for radiology report.    PROCEDURES:  Critical Care performed:   Procedures   MEDICATIONS ORDERED IN ED: Medications  aspirin chewable tablet 324 mg (has no administration in time range)  methylPREDNISolone sodium succinate (SOLU-MEDROL) 125 mg/2 mL injection 125 mg (125 mg Intravenous Given 04/19/22 0831)  ipratropium-albuterol (DUONEB) 0.5-2.5 (3) MG/3ML nebulizer solution 3 mL (3 mLs Nebulization Given 04/19/22 0833)  ipratropium-albuterol (DUONEB) 0.5-2.5 (3) MG/3ML nebulizer solution 3 mL (3 mLs Nebulization Given 04/19/22 0833)     IMPRESSION / MDM / ASSESSMENT AND PLAN / ED COURSE  I reviewed the triage vital signs and the nursing notes.                              Differential diagnosis  includes, but is not limited to, Asthma, copd, CHF, pna, ptx, malignancy, Pe, anemia  Patient presenting to the ER for evaluation of symptoms as described above.  Based on symptoms, risk factors and considered above differential, this presenting complaint could reflect a potentially life-threatening illness therefore the patient will be placed on continuous pulse oximetry and telemetry for monitoring.  Laboratory evaluation will be sent to evaluate for the above complaints.  Patient has improved on arrival to the ER but sounds like she was in moderate respiratory distress with EMS.  Bronchodilator responsive.  Will give Solu-Medrol additional nebulizer as she does have diffuse wheezing and mild tachypnea.  She is RSV positive.  We discussed option for trial of outpatient management versus observation in the hospital and patient elected to proceed with hospital observation given her significant dyspnea this morning.  Hospitalist consulted for admission.   Clinical Course as of 04/19/22 0923  Mon Apr 19, 2022  9811 Patient's repeat troponin significantly elevated to 100 but she denies any chest pain at this time.  Suspect this is likely secondary to hypoxia in the setting of respiratory distress we will continue to observe. [PR]    Clinical Course User Index [PR] Willy Eddy, MD   FINAL CLINICAL IMPRESSION(S) / ED DIAGNOSES   Final diagnoses:  COPD exacerbation (HCC)  RSV (acute bronchiolitis due to respiratory syncytial virus)     Rx / DC Orders   ED Discharge Orders     None        Note:  This document was prepared using Dragon voice recognition software and may include unintentional dictation errors.    Willy Eddy, MD 04/19/22 9147    Willy Eddy, MD 04/19/22 [phone]

## 2022-04-19 NOTE — ED Triage Notes (Addendum)
First RN Note: pt to ED via [facility] from home with c/o difficulty breathing x 1 hr. Per EMS pt awoke with SOB, used rescue inhaler without relief. Per EMS pt on arrival RA sats 90% RA, 1 duoneb given en route and RA O2 sat went to 95-99%. Per EMS pt's husband tested positive for Covid on Monday, pt tested negative.   65HR 115/80 99% RA EtCo 32

## 2022-04-20 DIAGNOSIS — J441 Chronic obstructive pulmonary disease with (acute) exacerbation: Secondary | ICD-10-CM | POA: Diagnosis not present

## 2022-04-20 MED ORDER — PREDNISONE 50 MG PO TABS: 50.0000 mg | ORAL_TABLET | Freq: Every day | ORAL | 0 refills | Status: DC

## 2022-04-20 MED ORDER — IPRATROPIUM-ALBUTEROL 0.5-2.5 (3) MG/3ML IN SOLN: RESPIRATORY_TRACT | 0 refills | Status: DC

## 2022-04-20 MED ORDER — ALBUTEROL SULFATE (2.5 MG/3ML) 0.083% IN NEBU: 2.5000 mg | INHALATION_SOLUTION | RESPIRATORY_TRACT | 0 refills | Status: DC | PRN

## 2022-04-20 NOTE — Progress Notes (Signed)
Discharge instructions were reviewed with pt and family. IV was taken out. Pt denies pain at this time. Questions were encourage and answered. Belongings were collected by pt and her family.

## 2022-04-20 NOTE — Discharge Summary (Signed)
Physician Discharge Summary   Patient: Julia [name] MRN: [id number]  DOB: [birth year]   Admit:     Date of Admission: 04/19/2022 Admitted from: home   Discharge: Date of discharge: 04/20/22 Disposition: Home Condition at discharge: good  CODE STATUS: FULL CODE      Discharge Physician: Sunnie Nielsen, DO [facility]     PCP: Olena Leatherwood, FNP  Recommendations for Outpatient Follow-up:  Follow up with PCP Olena Leatherwood, FNP in 1-2 weeks   Discharge Instructions     Diet - low sodium heart healthy   Complete by: As directed    Increase activity slowly   Complete by: As directed          Discharge Diagnoses: Principal Problem:   COPD exacerbation [facility])       Hospital Course: Julia [name] is a 61 y.o. female with advanced COPD wearing 2.5 L oxygen at home presents to the ED early AM 04/19/2022 for worsening shortness of breath wheezing cough congestion.  Husband was sick with COVID last week.  She denies any nausea or vomiting.  Denies any chest pain.  Was found by EMS to be hypoxic to the 80s after she took her albuterol treatments this morning without improvement.  Was given another nebulizer treatment with improvement.  12/18: in ED, SpO2 87%, (+)RSV, Hgb 11.2, EKG no concerns. Tx: SoluMedrol 125 mg IV, DuoNeb x2. Keeping for obs to monitor given acute respiratory failure  Consultants:  none  Procedures: none      ASSESSMENT & PLAN:   Principal Problem:   COPD exacerbation (HCC)  COPD exacerbation Due to RSV Supportive care: antitussives O2 at  baseline 2.5 L/min SoluMedrol --> prednisone Bronchodilators No antibiotics indicated at this time   Tobacco abuse/dependence Declined nicotine patch  Anxiety Insomnia Home medications as below             Discharge Instructions  Allergies as of 04/20/2022       Reactions   Banana Nausea And Vomiting        Medication List     STOP taking  these medications    gabapentin 300 MG capsule Commonly known as: NEURONTIN   guaiFENesin 100 MG/5ML liquid Commonly known as: ROBITUSSIN   predniSONE 10 MG (21) Tbpk tablet Commonly known as: STERAPRED UNI-PAK 21 TAB Replaced by: predniSONE 50 MG tablet       TAKE these medications    acetaminophen 500 MG tablet Commonly known as: TYLENOL Take 1,000 mg by mouth every 6 (six) hours as needed.   albuterol 108 (90 Base) MCG/ACT inhaler Commonly known as: VENTOLIN HFA Inhale 2 puffs into the lungs every 6 (six) hours as needed for wheezing or shortness of breath. What changed: Another medication with the same name was changed. Make sure you understand how and when to take each.   albuterol (2.5 MG/3ML) 0.083% nebulizer solution Commonly known as: PROVENTIL Take 3 mLs (2.5 mg total) by nebulization every 2 (two) hours as needed for wheezing or shortness of breath. What changed: when to take this   ALPRAZolam 0.5 MG tablet Commonly known as: XANAX Take 0.5 mg by mouth 2 (two) times daily as needed.   dextromethorphan-guaiFENesin 30-600 MG 12hr tablet Commonly known as: MUCINEX DM Take 1 tablet by mouth 2 (two) times daily.   docusate sodium 100 MG capsule Commonly known as: COLACE Take 100 mg by mouth daily as needed for mild constipation.   ferrous sulfate 325 (65 FE)  MG tablet Take 1 tablet (325 mg total) by mouth daily.   ipratropium-albuterol 0.5-2.5 (3) MG/3ML Soln Commonly known as: DUONEB Take 3 mLs by nebulization every 4-6 (four to six) hours while awake as needed during acute COPD exacerbation for up to 5 days What changed:  how much to take how to take this when to take this reasons to take this additional instructions   magnesium oxide 400 MG tablet Commonly known as: MAG-OX Take 400 mg by mouth daily.   pantoprazole 40 MG tablet Commonly known as: PROTONIX Take 40 mg by mouth daily.   predniSONE 50 MG tablet Commonly known as: DELTASONE Take 1  tablet (50 mg total) by mouth daily with breakfast. Start taking on: April 21, 2022 Replaces: predniSONE 10 MG (21) Tbpk tablet   sertraline 100 MG tablet Commonly known as: ZOLOFT Take 150 mg by mouth daily.   Trelegy Ellipta 100-62.5-25 MCG/ACT Aepb Generic drug: Fluticasone-Umeclidin-Vilant Inhale 1 puff into the lungs daily.          Allergies  Allergen Reactions   Banana Nausea And Vomiting     Subjective: pt has no SOB or CP this morning    Discharge Exam: BP 103/77 (BP Location: Left Arm)   Pulse 94   Temp 98.2 F (36.8 C) (Oral)   Resp 20   Ht 5\' 5"  (1.651 m)   Wt 58.1 kg   SpO2 95%   BMI 21.30 kg/m  General: Pt is alert, awake, not in acute distress Cardiovascular: RRR, S1/S2 +, no rubs, no gallops Respiratory: + wheezing bu timproved no rhonchi Abdominal: Soft, NT, ND, bowel sounds + Extremities: no edema, no cyanosis     The results of significant diagnostics from this hospitalization (including imaging, microbiology, ancillary and laboratory) are listed below for reference.     Microbiology: Recent Results (from the past 240 hour(s))  Resp panel by RT-PCR (RSV, Flu A&B, Covid) Anterior Nasal Swab     Status: Abnormal   Collection Time: 04/19/22  5:51 AM   Specimen: Anterior Nasal Swab  Result Value Ref Range Status   SARS Coronavirus 2 by RT PCR NEGATIVE NEGATIVE Final    Comment: (NOTE) SARS-CoV-2 target nucleic acids are NOT DETECTED.  The SARS-CoV-2 RNA is generally detectable in upper respiratory specimens during the acute phase of infection. The lowest concentration of SARS-CoV-2 viral copies this assay can detect is 138 copies/mL. A negative result does not preclude SARS-Cov-2 infection and should not be used as the sole basis for treatment or other patient management decisions. A negative result may occur with  improper specimen collection/handling, submission of specimen other than nasopharyngeal swab, presence of viral  mutation(s) within the areas targeted by this assay, and inadequate number of viral copies(<138 copies/mL). A negative result must be combined with clinical observations, patient history, and epidemiological information. The expected result is Negative.  Fact Sheet for Patients:  04/21/22  Fact Sheet for Healthcare Providers:  BloggerCourse.com  This test is no t yet approved or cleared by the [place] FDA and  has been authorized for detection and/or diagnosis of SARS-CoV-2 by FDA under an Emergency Use Authorization (EUA). This EUA will remain  in effect (meaning this test can be used) for the duration of the COVID-19 declaration under Section 564(b)(1) of the Act, 21 U.S.C.section 360bbb-3(b)(1), unless the authorization is terminated  or revoked sooner.       Influenza A by PCR NEGATIVE NEGATIVE Final   Influenza B by PCR NEGATIVE NEGATIVE Final  Comment: (NOTE) The Xpert Xpress SARS-CoV-2/FLU/RSV plus assay is intended as an aid in the diagnosis of influenza from Nasopharyngeal swab specimens and should not be used as a sole basis for treatment. Nasal washings and aspirates are unacceptable for Xpert Xpress SARS-CoV-2/FLU/RSV testing.  Fact Sheet for Patients: BloggerCourse.com  Fact Sheet for Healthcare Providers: SeriousBroker.it  This test is not yet approved or cleared by the [place] FDA and has been authorized for detection and/or diagnosis of SARS-CoV-2 by FDA under an Emergency Use Authorization (EUA). This EUA will remain in effect (meaning this test can be used) for the duration of the COVID-19 declaration under Section 564(b)(1) of the Act, 21 U.S.C. section 360bbb-3(b)(1), unless the authorization is terminated or revoked.     Resp Syncytial Virus by PCR POSITIVE (A) NEGATIVE Final    Comment: (NOTE) Fact Sheet for  Patients: BloggerCourse.com  Fact Sheet for Healthcare Providers: SeriousBroker.it  This test is not yet approved or cleared by the [place] FDA and has been authorized for detection and/or diagnosis of SARS-CoV-2 by FDA under an Emergency Use Authorization (EUA). This EUA will remain in effect (meaning this test can be used) for the duration of the COVID-19 declaration under Section 564(b)(1) of the Act, 21 U.S.C. section 360bbb-3(b)(1), unless the authorization is terminated or revoked.  Performed at [facility], [address] [place], [place] [address]      Labs: BNP (last 3 results) Recent Labs    07/19/21 0719 09/30/21 0439 02/26/22 0640  BNP 77.2 91.8 82.9   Basic Metabolic Panel: Recent Labs  Lab 04/19/22 0551  NA 141  K 4.2  CL 106  CO2 28  GLUCOSE 96  BUN 21  CREATININE 0.58  CALCIUM 9.7   Liver Function Tests: No results for input(s): "AST", "ALT", "ALKPHOS", "BILITOT", "PROT", "ALBUMIN" in the last 168 hours. No results for input(s): "LIPASE", "AMYLASE" in the last 168 hours. No results for input(s): "AMMONIA" in the last 168 hours. CBC: Recent Labs  Lab 04/19/22 0551  WBC 7.2  HGB 11.2*  HCT 36.2  MCV 83.0  PLT 145*   Cardiac Enzymes: No results for input(s): "CKTOTAL", "CKMB", "CKMBINDEX", "TROPONINI" in the last 168 hours. BNP: Invalid input(s): "POCBNP" CBG: No results for input(s): "GLUCAP" in the last 168 hours. D-Dimer No results for input(s): "DDIMER" in the last 72 hours. Hgb A1c No results for input(s): "HGBA1C" in the last 72 hours. Lipid Profile No results for input(s): "CHOL", "HDL", "LDLCALC", "TRIG", "CHOLHDL", "LDLDIRECT" in the last 72 hours. Thyroid function studies No results for input(s): "TSH", "T4TOTAL", "T3FREE", "THYROIDAB" in the last 72 hours.  Invalid input(s): "FREET3" Anemia work up No results for input(s): "VITAMINB12", "FOLATE",  "FERRITIN", "TIBC", "IRON", "RETICCTPCT" in the last 72 hours. Urinalysis    Component Value Date/Time   COLORURINE YELLOW (A) 09/23/2021 1424   APPEARANCEUR CLEAR (A) 09/23/2021 1424   LABSPEC 1.023 09/23/2021 1424   PHURINE 5.0 09/23/2021 1424   GLUCOSEU 50 (A) 09/23/2021 1424   HGBUR NEGATIVE 09/23/2021 1424   BILIRUBINUR NEGATIVE 09/23/2021 1424   KETONESUR NEGATIVE 09/23/2021 1424   PROTEINUR NEGATIVE 09/23/2021 1424   NITRITE NEGATIVE 09/23/2021 1424   LEUKOCYTESUR NEGATIVE 09/23/2021 1424   Sepsis Labs Recent Labs  Lab 04/19/22 0551  WBC 7.2   Microbiology Recent Results (from the past 240 hour(s))  Resp panel by RT-PCR (RSV, Flu A&B, Covid) Anterior Nasal Swab     Status: Abnormal   Collection Time: 04/19/22  5:51 AM   Specimen:  Anterior Nasal Swab  Result Value Ref Range Status   SARS Coronavirus 2 by RT PCR NEGATIVE NEGATIVE Final    Comment: (NOTE) SARS-CoV-2 target nucleic acids are NOT DETECTED.  The SARS-CoV-2 RNA is generally detectable in upper respiratory specimens during the acute phase of infection. The lowest concentration of SARS-CoV-2 viral copies this assay can detect is 138 copies/mL. A negative result does not preclude SARS-Cov-2 infection and should not be used as the sole basis for treatment or other patient management decisions. A negative result may occur with  improper specimen collection/handling, submission of specimen other than nasopharyngeal swab, presence of viral mutation(s) within the areas targeted by this assay, and inadequate number of viral copies(<138 copies/mL). A negative result must be combined with clinical observations, patient history, and epidemiological information. The expected result is Negative.  Fact Sheet for Patients:  BloggerCourse.comhttps://www.fda.gov/media/152166/download  Fact Sheet for Healthcare Providers:  SeriousBroker.ithttps://www.fda.gov/media/152162/download  This test is no t yet approved or cleared by the [place] FDA and   has been authorized for detection and/or diagnosis of SARS-CoV-2 by FDA under an Emergency Use Authorization (EUA). This EUA will remain  in effect (meaning this test can be used) for the duration of the COVID-19 declaration under Section 564(b)(1) of the Act, 21 U.S.C.section 360bbb-3(b)(1), unless the authorization is terminated  or revoked sooner.       Influenza A by PCR NEGATIVE NEGATIVE Final   Influenza B by PCR NEGATIVE NEGATIVE Final    Comment: (NOTE) The Xpert Xpress SARS-CoV-2/FLU/RSV plus assay is intended as an aid in the diagnosis of influenza from Nasopharyngeal swab specimens and should not be used as a sole basis for treatment. Nasal washings and aspirates are unacceptable for Xpert Xpress SARS-CoV-2/FLU/RSV testing.  Fact Sheet for Patients: BloggerCourse.comhttps://www.fda.gov/media/152166/download  Fact Sheet for Healthcare Providers: SeriousBroker.ithttps://www.fda.gov/media/152162/download  This test is not yet approved or cleared by the [place] FDA and has been authorized for detection and/or diagnosis of SARS-CoV-2 by FDA under an Emergency Use Authorization (EUA). This EUA will remain in effect (meaning this test can be used) for the duration of the COVID-19 declaration under Section 564(b)(1) of the Act, 21 U.S.C. section 360bbb-3(b)(1), unless the authorization is terminated or revoked.     Resp Syncytial Virus by PCR POSITIVE (A) NEGATIVE Final    Comment: (NOTE) Fact Sheet for Patients: BloggerCourse.comhttps://www.fda.gov/media/152166/download  Fact Sheet for Healthcare Providers: SeriousBroker.ithttps://www.fda.gov/media/152162/download  This test is not yet approved or cleared by the [place] FDA and has been authorized for detection and/or diagnosis of SARS-CoV-2 by FDA under an Emergency Use Authorization (EUA). This EUA will remain in effect (meaning this test can be used) for the duration of the COVID-19 declaration under Section 564(b)(1) of the Act, 21 U.S.C. section 360bbb-3(b)(1),  unless the authorization is terminated or revoked.  Performed at [facility], [address] [place], [place] [id number]    Imaging DG Chest 2 View  Result Date: 04/19/2022 CLINICAL DATA:  61 year old female with shortness of breath. EXAM: CHEST - 2 VIEW COMPARISON:  Chest CT 02/26/2022 and earlier. FINDINGS: PA and lateral views at 0601 hours. Evidence of emphysema on the October CT which also demonstrated widespread peribronchial and tree-in-bud bilateral pulmonary opacity. Compared to radiographs that month reticular interstitial opacity and more confluent right lower lung peribronchial opacity has regressed. Underlying large lung volumes. Stable cardiac size and mediastinal contours. Moderate size hiatal hernia. No pneumothorax. No definite pleural effusion or new pulmonary opacity. Osteopenia. Stable visualized osseous structures. Chronic lower thoracic compression  fracture. Negative visible bowel gas. 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 Improved ventilation compared to October. No new cardiopulmonary abnormality. Electronically Signed   By: Odessa Fleming M.D.   On: 04/19/2022 06:47      Time coordinating discharge: over 30 minutes  SIGNED:  Sunnie Nielsen DO [facility]

## 2022-04-20 NOTE — Plan of Care (Addendum)
Patient received from the ED on 2L [place],Patient Alert and orientedx4,ambulatory.Skin check done with Demetris RN.Patient belongings at bedside.No home medications. Problem: Education: Goal: Knowledge of General Education information will improve Description: Including pain rating scale, medication(s)/side effects and non-pharmacologic comfort measures Outcome: Progressing   Problem: Health Behavior/Discharge Planning: Goal: Ability to manage health-related needs will improve Outcome: Progressing   Problem: Clinical Measurements: Goal: Ability to maintain clinical measurements within normal limits will improve Outcome: Progressing Goal: Will remain free from infection Outcome: Progressing Goal: Diagnostic test results will improve Outcome: Progressing Goal: Respiratory complications will improve Outcome: Progressing Goal: Cardiovascular complication will be avoided Outcome: Progressing   Problem: Activity: Goal: Risk for activity intolerance will decrease Outcome: Progressing   Problem: Nutrition: Goal: Adequate nutrition will be maintained Outcome: Progressing   Problem: Coping: Goal: Level of anxiety will decrease Outcome: Progressing   Problem: Elimination: Goal: Will not experience complications related to bowel motility Outcome: Progressing Goal: Will not experience complications related to urinary retention Outcome: Progressing   Problem: Pain Managment: Goal: General experience of comfort will improve Outcome: Progressing   Problem: Safety: Goal: Ability to remain free from injury will improve Outcome: Progressing   Problem: Skin Integrity: Goal: Risk for impaired skin integrity will decrease Outcome: Progressing   Problem: Education: Goal: Knowledge of General Education information will improve Description: Including pain rating scale, medication(s)/side effects and non-pharmacologic comfort measures Outcome: Progressing   Problem: Health  Behavior/Discharge Planning: Goal: Ability to manage health-related needs will improve Outcome: Progressing   Problem: Clinical Measurements: Goal: Ability to maintain clinical measurements within normal limits will improve Outcome: Progressing Goal: Will remain free from infection Outcome: Progressing Goal: Diagnostic test results will improve Outcome: Progressing Goal: Respiratory complications will improve Outcome: Progressing Goal: Cardiovascular complication will be avoided Outcome: Progressing   Problem: Activity: Goal: Risk for activity intolerance will decrease Outcome: Progressing   Problem: Nutrition: Goal: Adequate nutrition will be maintained Outcome: Progressing   Problem: Coping: Goal: Level of anxiety will decrease Outcome: Progressing   Problem: Elimination: Goal: Will not experience complications related to bowel motility Outcome: Progressing Goal: Will not experience complications related to urinary retention Outcome: Progressing   Problem: Pain Managment: Goal: General experience of comfort will improve Outcome: Progressing   Problem: Safety: Goal: Ability to remain free from injury will improve Outcome: Progressing   Problem: Skin Integrity: Goal: Risk for impaired skin integrity will decrease Outcome: Progressing

## 2022-04-20 NOTE — TOC CM/SW Note (Signed)
Patient has orders to discharge home today. Chart reviewed. No TOC needs identified. Patient already has 2 L continuous oxygen and NIV at home through [facility]. CSW signing off.  Charlynn Court, CSW [phone]

## 2022-04-20 NOTE — Plan of Care (Signed)

## 2022-05-10 DIAGNOSIS — J9621 Acute and chronic respiratory failure with hypoxia: Secondary | ICD-10-CM | POA: Diagnosis not present

## 2022-05-10 DIAGNOSIS — J449 Chronic obstructive pulmonary disease, unspecified: Secondary | ICD-10-CM | POA: Diagnosis not present

## 2022-05-18 DIAGNOSIS — R918 Other nonspecific abnormal finding of lung field: Secondary | ICD-10-CM | POA: Diagnosis not present

## 2022-05-18 DIAGNOSIS — J432 Centrilobular emphysema: Secondary | ICD-10-CM | POA: Diagnosis not present

## 2022-05-18 DIAGNOSIS — I2510 Atherosclerotic heart disease of native coronary artery without angina pectoris: Secondary | ICD-10-CM | POA: Diagnosis not present

## 2022-06-01 DIAGNOSIS — M542 Cervicalgia: Secondary | ICD-10-CM | POA: Diagnosis not present

## 2022-06-01 DIAGNOSIS — M5412 Radiculopathy, cervical region: Secondary | ICD-10-CM | POA: Insufficient documentation

## 2022-06-10 DIAGNOSIS — J449 Chronic obstructive pulmonary disease, unspecified: Secondary | ICD-10-CM | POA: Diagnosis not present

## 2022-06-10 DIAGNOSIS — J9621 Acute and chronic respiratory failure with hypoxia: Secondary | ICD-10-CM | POA: Diagnosis not present

## 2022-07-01 DIAGNOSIS — M25511 Pain in right shoulder: Secondary | ICD-10-CM | POA: Diagnosis not present

## 2022-07-01 DIAGNOSIS — M24811 Other specific joint derangements of right shoulder, not elsewhere classified: Secondary | ICD-10-CM | POA: Diagnosis not present

## 2022-07-01 DIAGNOSIS — M5412 Radiculopathy, cervical region: Secondary | ICD-10-CM | POA: Diagnosis not present

## 2022-07-01 DIAGNOSIS — M542 Cervicalgia: Secondary | ICD-10-CM | POA: Diagnosis not present

## 2022-07-09 DIAGNOSIS — J9621 Acute and chronic respiratory failure with hypoxia: Secondary | ICD-10-CM | POA: Diagnosis not present

## 2022-07-09 DIAGNOSIS — J449 Chronic obstructive pulmonary disease, unspecified: Secondary | ICD-10-CM | POA: Diagnosis not present

## 2022-07-18 ENCOUNTER — Emergency Department: Payer: 59

## 2022-07-18 ENCOUNTER — Encounter: Payer: Self-pay | Admitting: Radiology

## 2022-07-18 ENCOUNTER — Other Ambulatory Visit: Payer: Self-pay

## 2022-07-18 ENCOUNTER — Inpatient Hospital Stay
Admission: EM | Admit: 2022-07-18 | Discharge: 2022-07-20 | DRG: 871 | Disposition: A | Discharge status: Home / Self Care | Payer: 59 | Attending: Obstetrics and Gynecology | Admitting: Obstetrics and Gynecology

## 2022-07-18 DIAGNOSIS — Z7951 Long term (current) use of inhaled steroids: Secondary | ICD-10-CM | POA: Diagnosis not present

## 2022-07-18 DIAGNOSIS — F411 Generalized anxiety disorder: Secondary | ICD-10-CM | POA: Diagnosis not present

## 2022-07-18 DIAGNOSIS — J441 Chronic obstructive pulmonary disease with (acute) exacerbation: Secondary | ICD-10-CM | POA: Diagnosis not present

## 2022-07-18 DIAGNOSIS — F17210 Nicotine dependence, cigarettes, uncomplicated: Secondary | ICD-10-CM | POA: Diagnosis present

## 2022-07-18 DIAGNOSIS — Z9981 Dependence on supplemental oxygen: Secondary | ICD-10-CM

## 2022-07-18 DIAGNOSIS — J9622 Acute and chronic respiratory failure with hypercapnia: Secondary | ICD-10-CM

## 2022-07-18 DIAGNOSIS — R6520 Severe sepsis without septic shock: Secondary | ICD-10-CM | POA: Diagnosis not present

## 2022-07-18 DIAGNOSIS — I10 Essential (primary) hypertension: Secondary | ICD-10-CM | POA: Diagnosis not present

## 2022-07-18 DIAGNOSIS — R0902 Hypoxemia: Secondary | ICD-10-CM | POA: Diagnosis not present

## 2022-07-18 DIAGNOSIS — Z79899 Other long term (current) drug therapy: Secondary | ICD-10-CM

## 2022-07-18 DIAGNOSIS — J440 Chronic obstructive pulmonary disease with acute lower respiratory infection: Secondary | ICD-10-CM | POA: Diagnosis present

## 2022-07-18 DIAGNOSIS — Z1152 Encounter for screening for COVID-19: Secondary | ICD-10-CM

## 2022-07-18 DIAGNOSIS — Z91018 Allergy to other foods: Secondary | ICD-10-CM

## 2022-07-18 DIAGNOSIS — K449 Diaphragmatic hernia without obstruction or gangrene: Secondary | ICD-10-CM | POA: Diagnosis not present

## 2022-07-18 DIAGNOSIS — J9621 Acute and chronic respiratory failure with hypoxia: Secondary | ICD-10-CM | POA: Diagnosis present

## 2022-07-18 DIAGNOSIS — J204 Acute bronchitis due to parainfluenza virus: Secondary | ICD-10-CM | POA: Insufficient documentation

## 2022-07-18 DIAGNOSIS — J189 Pneumonia, unspecified organism: Secondary | ICD-10-CM | POA: Diagnosis not present

## 2022-07-18 DIAGNOSIS — A419 Sepsis, unspecified organism: Secondary | ICD-10-CM

## 2022-07-18 DIAGNOSIS — J449 Chronic obstructive pulmonary disease, unspecified: Secondary | ICD-10-CM | POA: Diagnosis not present

## 2022-07-18 DIAGNOSIS — R0689 Other abnormalities of breathing: Secondary | ICD-10-CM | POA: Diagnosis not present

## 2022-07-18 DIAGNOSIS — G4733 Obstructive sleep apnea (adult) (pediatric): Secondary | ICD-10-CM | POA: Diagnosis not present

## 2022-07-18 DIAGNOSIS — G8929 Other chronic pain: Secondary | ICD-10-CM | POA: Diagnosis present

## 2022-07-18 DIAGNOSIS — Z8614 Personal history of Methicillin resistant Staphylococcus aureus infection: Secondary | ICD-10-CM | POA: Diagnosis not present

## 2022-07-18 DIAGNOSIS — J9601 Acute respiratory failure with hypoxia: Principal | ICD-10-CM

## 2022-07-18 DIAGNOSIS — E8729 Other acidosis: Secondary | ICD-10-CM | POA: Diagnosis present

## 2022-07-18 DIAGNOSIS — Z811 Family history of alcohol abuse and dependence: Secondary | ICD-10-CM | POA: Diagnosis not present

## 2022-07-18 DIAGNOSIS — K219 Gastro-esophageal reflux disease without esophagitis: Secondary | ICD-10-CM | POA: Insufficient documentation

## 2022-07-18 DIAGNOSIS — F32A Depression, unspecified: Secondary | ICD-10-CM | POA: Diagnosis present

## 2022-07-18 DIAGNOSIS — R0600 Dyspnea, unspecified: Secondary | ICD-10-CM | POA: Diagnosis not present

## 2022-07-18 LAB — CBC WITH DIFFERENTIAL/PLATELET
Abs Immature Granulocytes: 0.02 10*3/uL (ref 0.00–0.07)
Basophils Absolute: 0 10*3/uL (ref 0.0–0.1)
Basophils Relative: 1 %
Eosinophils Absolute: 0.1 10*3/uL (ref 0.0–0.5)
Eosinophils Relative: 2 %
HCT: 33.5 % — ABNORMAL LOW (ref 36.0–46.0)
Hemoglobin: 10 g/dL — ABNORMAL LOW (ref 12.0–15.0)
Immature Granulocytes: 0 %
Lymphocytes Relative: 15 %
Lymphs Abs: 1.1 10*3/uL (ref 0.7–4.0)
MCH: 25.6 pg — ABNORMAL LOW (ref 26.0–34.0)
MCHC: 29.9 g/dL — ABNORMAL LOW (ref 30.0–36.0)
MCV: 85.7 fL (ref 80.0–100.0)
Monocytes Absolute: 0.5 10*3/uL (ref 0.1–1.0)
Monocytes Relative: 7 %
Neutro Abs: 5.3 10*3/uL (ref 1.7–7.7)
Neutrophils Relative %: 75 %
Platelets: UNDETERMINED 10*3/uL (ref 150–400)
RBC: 3.91 MIL/uL (ref 3.87–5.11)
RDW: 17.3 % — ABNORMAL HIGH (ref 11.5–15.5)
Smear Review: UNDETERMINED
WBC: 7.1 10*3/uL (ref 4.0–10.5)
nRBC: 0 % (ref 0.0–0.2)

## 2022-07-18 LAB — PROTIME-INR
INR: 0.9 (ref 0.8–1.2)
Prothrombin Time: 11.8 seconds (ref 11.4–15.2)

## 2022-07-18 LAB — RESP PANEL BY RT-PCR (RSV, FLU A&B, COVID)  RVPGX2
Influenza A by PCR: NEGATIVE
Influenza B by PCR: NEGATIVE
Resp Syncytial Virus by PCR: NEGATIVE
SARS Coronavirus 2 by RT PCR: NEGATIVE

## 2022-07-18 LAB — BRAIN NATRIURETIC PEPTIDE: B Natriuretic Peptide: 37.5 pg/mL (ref 0.0–100.0)

## 2022-07-18 LAB — EXPECTORATED SPUTUM ASSESSMENT W GRAM STAIN, RFLX TO RESP C

## 2022-07-18 LAB — COMPREHENSIVE METABOLIC PANEL
ALT: 17 U/L (ref 0–44)
AST: 25 U/L (ref 15–41)
Albumin: 3.7 g/dL (ref 3.5–5.0)
Alkaline Phosphatase: 69 U/L (ref 38–126)
Anion gap: 8 (ref 5–15)
BUN: 18 mg/dL (ref 8–23)
CO2: 29 mmol/L (ref 22–32)
Calcium: 9.3 mg/dL (ref 8.9–10.3)
Chloride: 99 mmol/L (ref 98–111)
Creatinine, Ser: 0.69 mg/dL (ref 0.44–1.00)
GFR, Estimated: >60 mL/min (ref >60)
Glucose, Bld: 104 mg/dL — ABNORMAL HIGH (ref 70–99)
Potassium: 4.9 mmol/L (ref 3.5–5.1)
Sodium: 136 mmol/L (ref 135–145)
Total Bilirubin: 0.3 mg/dL (ref 0.3–1.2)
Total Protein: 7.6 g/dL (ref 6.5–8.1)

## 2022-07-18 LAB — BLOOD GAS, VENOUS
Acid-Base Excess: 6.4 mmol/L — ABNORMAL HIGH (ref 0.0–2.0)
Bicarbonate: 35.4 mmol/L — ABNORMAL HIGH (ref 20.0–28.0)
Patient temperature: 37
pCO2, Ven: 72 mmHg (ref 44–60)
pH, Ven: 7.3 (ref 7.25–7.43)

## 2022-07-18 LAB — TROPONIN I (HIGH SENSITIVITY)
Troponin I (High Sensitivity): 6 ng/L (ref <18)
Troponin I (High Sensitivity): 6 ng/L (ref <18)

## 2022-07-18 LAB — LIPASE, BLOOD: Lipase: 28 U/L (ref 11–51)

## 2022-07-18 LAB — LACTIC ACID, PLASMA
Lactic Acid, Venous: 2.2 mmol/L (ref 0.5–1.9)
Lactic Acid, Venous: 1.2 mmol/L (ref 0.5–1.9)

## 2022-07-18 MED ORDER — LACTATED RINGERS IV BOLUS (SEPSIS)
250.0000 mL | Freq: Once | INTRAVENOUS | Status: AC
  Administered 2022-07-18: 250 mL via INTRAVENOUS

## 2022-07-18 MED ORDER — VANCOMYCIN HCL IN DEXTROSE 1-5 GM/200ML-% IV SOLN: 1000.0000 mg | Freq: Once | INTRAVENOUS | Status: DC

## 2022-07-18 MED ORDER — SODIUM CHLORIDE 0.9 % IV SOLN
500.0000 mg | INTRAVENOUS | Status: DC
  Administered 2022-07-18 – 2022-07-19 (×2): 500 mg via INTRAVENOUS
  Filled 2022-07-18: qty 500
  Filled 2022-07-18: qty 5

## 2022-07-18 MED ORDER — METHYLPREDNISOLONE SODIUM SUCC 125 MG IJ SOLR
125.0000 mg | Freq: Two times a day (BID) | INTRAMUSCULAR | Status: AC
  Administered 2022-07-18 – 2022-07-19 (×2): 125 mg via INTRAVENOUS
  Filled 2022-07-18 (×2): qty 2

## 2022-07-18 MED ORDER — LACTATED RINGERS IV BOLUS (SEPSIS)
1000.0000 mL | Freq: Once | INTRAVENOUS | Status: AC
  Administered 2022-07-18: 1000 mL via INTRAVENOUS

## 2022-07-18 MED ORDER — LACTATED RINGERS IV BOLUS (SEPSIS)
500.0000 mL | Freq: Once | INTRAVENOUS | Status: AC
  Administered 2022-07-18: 500 mL via INTRAVENOUS

## 2022-07-18 MED ORDER — ONDANSETRON HCL 4 MG/2ML IJ SOLN: 4.0000 mg | Freq: Four times a day (QID) | INTRAMUSCULAR | Status: DC | PRN

## 2022-07-18 MED ORDER — SODIUM CHLORIDE 0.9 % IV SOLN: 2.0000 g | Freq: Three times a day (TID) | INTRAVENOUS | Status: DC

## 2022-07-18 MED ORDER — VANCOMYCIN HCL 1250 MG/250ML IV SOLN
1250.0000 mg | INTRAVENOUS | Status: DC
  Administered 2022-07-18: 1250 mg via INTRAVENOUS
  Filled 2022-07-18: qty 250

## 2022-07-18 MED ORDER — SODIUM CHLORIDE 0.9 % IV SOLN
2.0000 g | Freq: Once | INTRAVENOUS | Status: AC
  Administered 2022-07-18: 2 g via INTRAVENOUS
  Filled 2022-07-18: qty 12.5

## 2022-07-18 MED ORDER — ALPRAZOLAM 0.5 MG PO TABS
0.5000 mg | ORAL_TABLET | Freq: Two times a day (BID) | ORAL | Status: DC | PRN
  Administered 2022-07-18 – 2022-07-20 (×5): 0.5 mg via ORAL
  Filled 2022-07-18 (×5): qty 1

## 2022-07-18 MED ORDER — ENOXAPARIN SODIUM 40 MG/0.4ML IJ SOSY
40.0000 mg | PREFILLED_SYRINGE | INTRAMUSCULAR | Status: DC
  Administered 2022-07-18 – 2022-07-19 (×2): 40 mg via SUBCUTANEOUS
  Filled 2022-07-18 (×2): qty 0.4

## 2022-07-18 MED ORDER — PREDNISONE 20 MG PO TABS: 40.0000 mg | ORAL_TABLET | Freq: Every day | ORAL | Status: DC

## 2022-07-18 MED ORDER — ORAL CARE MOUTH RINSE: 15.0000 mL | OROMUCOSAL | Status: DC | PRN

## 2022-07-18 MED ORDER — IPRATROPIUM-ALBUTEROL 0.5-2.5 (3) MG/3ML IN SOLN
3.0000 mL | Freq: Once | RESPIRATORY_TRACT | Status: AC
  Administered 2022-07-18: 3 mL via RESPIRATORY_TRACT
  Filled 2022-07-18: qty 3

## 2022-07-18 MED ORDER — ONDANSETRON HCL 4 MG PO TABS: 4.0000 mg | ORAL_TABLET | Freq: Four times a day (QID) | ORAL | Status: DC | PRN

## 2022-07-18 MED ORDER — SODIUM CHLORIDE 0.9 % IV SOLN: INTRAVENOUS | Status: DC

## 2022-07-18 MED ORDER — VANCOMYCIN HCL 1250 MG/250ML IV SOLN
1250.0000 mg | Freq: Once | INTRAVENOUS | Status: AC
  Administered 2022-07-18: 1250 mg via INTRAVENOUS
  Filled 2022-07-18: qty 250

## 2022-07-18 MED ORDER — IPRATROPIUM-ALBUTEROL 0.5-2.5 (3) MG/3ML IN SOLN
3.0000 mL | RESPIRATORY_TRACT | Status: DC
  Administered 2022-07-18 – 2022-07-19 (×5): 3 mL via RESPIRATORY_TRACT
  Filled 2022-07-18 (×5): qty 3

## 2022-07-18 MED ORDER — IPRATROPIUM-ALBUTEROL 0.5-2.5 (3) MG/3ML IN SOLN: 3.0000 mL | RESPIRATORY_TRACT | Status: DC | PRN

## 2022-07-18 MED ORDER — SODIUM CHLORIDE 0.9 % IV SOLN
2.0000 g | Freq: Three times a day (TID) | INTRAVENOUS | Status: DC
  Administered 2022-07-18 – 2022-07-20 (×7): 2 g via INTRAVENOUS
  Filled 2022-07-18: qty 2
  Filled 2022-07-18 (×4): qty 12.5
  Filled 2022-07-18 (×3): qty 2

## 2022-07-18 NOTE — Assessment & Plan Note (Signed)
IV Solu-Medrol, DuoNebs, severe pneumonia order set Continue BiPAP Titrate back to home 2 to 3 L nasal cannula as appropriate Follow

## 2022-07-18 NOTE — Sepsis Progress Note (Signed)
[facility] following for sepsis protocol. 

## 2022-07-18 NOTE — Progress Notes (Signed)
PHARMACY -  BRIEF ANTIBIOTIC NOTE   Pharmacy has received consult(s) for Vancomycin, Cefepime from an ED provider.  The patient's profile has been reviewed for ht/wt/allergies/indication/available labs.    One time order(s) placed for Vancomycin 1250 mg IV X 1 and Cefepime 2 gm IV X 1.   Further antibiotics/pharmacy consults should be ordered by admitting physician if indicated.                       Thank you, Jashley Yellin D 07/18/2022  5:11 AM

## 2022-07-18 NOTE — Progress Notes (Signed)
Pt taken off of bipap, tolerating well at this time. Placed on 3L [place], sats 98%, respiratory rate 18/min.

## 2022-07-18 NOTE — Assessment & Plan Note (Signed)
Decompensated respiratory failure in the setting of multifocal pneumonia and chronic respiratory failure with COPD on 2 L Currently on BiPAP Compensated respiratory acidosis on blood gas Started on IV cefepime and vancomycin in the ER Noted prior MRSA culture positive on 08/2021 COVID flu and RSV negative Will place on cefepime, azithromycin and vancomycin per severe pneumonia order set Blood cultures, respiratory cultures, expanded respiratory panel, urine strep Legionella IV Solu-Medrol, DuoNebs in the setting of decompensated COPD Follow

## 2022-07-18 NOTE — Assessment & Plan Note (Signed)
Multifocal pneumonia on imaging COVID flu and RSV negative Currently on BiPAP IV cefepime, azithromycin and vancomycin for infectious coverage for severe pneumonia order set Blood cultures, sputum cultures, urine strep and Legionella, expanded respiratory panel Supplemental oxygen as needed Follow

## 2022-07-18 NOTE — H&P (Signed)
History and Physical    Patient: Julia [name] [id number] DOB: August 01, 1960 DOA: 07/18/2022 DOS: the patient was seen and examined on 07/18/2022 PCP: Romualdo Bolk, FNP  Patient coming from: Home  Chief Complaint:  Chief Complaint  Patient presents with   Shortness of Breath   HPI: Julia [name] is a 62 y.o. female with medical history significant of chronic respiratory failure on 3 L at home, COPD, GERD, depression presenting with acute on chronic respiratory failure with hypoxia and hypercarbia, multifocal pneumonia, COPD exacerbation.  Patient reports 2 to 3 days of increased work of breathing.  Positive cough, wheezing, sputum production.  No fevers or chills.  No nausea or vomiting.  Minimal to mild chest pain.  No abdominal pain.  No hemiparesis or confusion.  Prior smoker.  Patient states she quit approximately 1 to 2 weeks ago.  Has been using home inhalers with minimal improvement in symptoms.  No reported sick contacts.  Was initially evaluated by EMS and found to be in severe respiratory distress.  Was given IV Solu-Medrol as well as 2 albuterol treatments and brought to the ER Presented to the ER afebrile, respiratory rate up into the 30s being transition from 4 L nasal cannula to BiPAP.  White count 7.1, hemoglobin 10, lactate 2.2-1.2, troponin 6, COVID flu and RSV negative.  Creatinine 0.7.  VBG with compensated respiratory acidosis.  Chest x-ray multifocal pneumonia at lung bases.  Review of Systems: As mentioned in the history of present illness. All other systems reviewed and are negative. Past Medical History:  Diagnosis Date   Anemia    Anxiety    Asthma    COPD (chronic obstructive pulmonary disease) ([place])    Depression    Ventral hernia    Past Surgical History:  Procedure Laterality Date   COLON SURGERY     EAR TUBE REMOVAL     FRACTURE SURGERY     HERNIA REPAIR     TUBAL LIGATION     Social History:  reports that she has been smoking cigarettes. She  has been smoking an average of .5 packs per day. She has never used smokeless tobacco. She reports that she does not drink alcohol and does not use drugs.  Allergies  Allergen Reactions   Banana Nausea And Vomiting    Family History  Problem Relation Age of Onset   Sarcoidosis Mother    Alcohol abuse Father     Prior to Admission medications   Medication Sig Start Date End Date Taking? Authorizing Provider  acetaminophen (TYLENOL) 500 MG tablet Take 1,000 mg by mouth every 6 (six) hours as needed.    [provider]  albuterol (PROVENTIL) (2.5 MG/3ML) 0.083% nebulizer solution Take 3 mLs (2.5 mg total) by nebulization every 2 (two) hours as needed for wheezing or shortness of breath. 04/20/22   Emeterio Reeve, DO  albuterol (VENTOLIN HFA) 108 (90 Base) MCG/ACT inhaler Inhale 2 puffs into the lungs every 6 (six) hours as needed for wheezing or shortness of breath. 03/02/22   Duard Brady, MD  ALPRAZolam Duanne Moron) 0.5 MG tablet Take 0.5 mg by mouth 2 (two) times daily as needed.    [provider]  dextromethorphan-guaiFENesin (MUCINEX DM) 30-600 MG 12hr tablet Take 1 tablet by mouth 2 (two) times daily. 03/20/21   Lavina Hamman, MD  docusate sodium (COLACE) 100 MG capsule Take 100 mg by mouth daily as needed for mild constipation.    [provider]  ferrous sulfate 325 (  65 FE) MG tablet Take 1 tablet (325 mg total) by mouth daily. 10/05/21 02/26/22  Sharen Hones, MD  ipratropium-albuterol (DUONEB) 0.5-2.5 (3) MG/3ML SOLN Take 3 mLs by nebulization every 4-6 (four to six) hours while awake as needed during acute COPD exacerbation for up to 5 days 04/20/22   Emeterio Reeve, DO  magnesium oxide (MAG-OX) 400 MG tablet Take 400 mg by mouth daily.    [provider]  pantoprazole (PROTONIX) 40 MG tablet Take 40 mg by mouth daily.    [provider]  predniSONE (DELTASONE) 50 MG tablet Take 1 tablet (50 mg total) by mouth daily with breakfast.  04/21/22   Emeterio Reeve, DO  sertraline (ZOLOFT) 100 MG tablet Take 150 mg by mouth daily.    [provider]  TRELEGY ELLIPTA 100-62.5-25 MCG/ACT AEPB Inhale 1 puff into the lungs daily. 03/02/22   Duard Brady, MD    Physical Exam: Vitals:   07/18/22 0415 07/18/22 0425 07/18/22 0500 07/18/22 0630  BP: (!) 153/86  (!) 168/87 (!) 160/82  Pulse: 97  99 84  Resp: (!) 28  (!) 35 (!) 25  Temp: 98.5 F (36.9 C)     TempSrc: Oral     SpO2: 96% 98% 98% 98%  Weight:      Height:       Physical Exam Constitutional:      Comments: Mild respiratory distress, BiPAP in place  HENT:     Head: Normocephalic.  Cardiovascular:     Rate and Rhythm: Normal rate and regular rhythm.  Pulmonary:     Comments: BiPAP in place Positive diffuse wheezing Positive mild increased work of breathing Abdominal:     General: Bowel sounds are normal.  Musculoskeletal:        General: Normal range of motion.     Cervical back: Normal range of motion.  Skin:    General: Skin is warm.  Neurological:     General: No focal deficit present.  Psychiatric:        Mood and Affect: Mood normal.     Data Reviewed:  There are no new results to review at this time. DG Chest Port 1 View CLINICAL DATA:  Questionable sepsis  EXAM: PORTABLE CHEST 1 VIEW  COMPARISON:  04/19/2022  FINDINGS: Patchy infiltrate in the bilateral lower lungs. Cardiomegaly and aortic tortuosity. There is a sizable hiatal hernia. No visible effusion or pneumothorax. COPD changes of on a chronic basis.  IMPRESSION: 1. Multifocal pneumonia at the lung bases. 2. Cardiomegaly, hiatal hernia, and chronic lung disease.  Electronically Signed   By: Jorje Guild M.D.   On: 07/18/2022 04:35  Lab Results  Component Value Date   WBC 7.1 07/18/2022   HGB 10.0 (L) 07/18/2022   HCT 33.5 (L) 07/18/2022   MCV 85.7 07/18/2022   PLT PLATELET CLUMPS NOTED ON SMEAR, UNABLE TO ESTIMATE [id number]   Last metabolic  panel Lab Results  Component Value Date   GLUCOSE 104 (H) 07/18/2022   NA 136 07/18/2022   K 4.9 07/18/2022   CL 99 07/18/2022   CO2 29 07/18/2022   BUN 18 07/18/2022   CREATININE 0.69 07/18/2022   GFRNONAA >60 07/18/2022   CALCIUM 9.3 07/18/2022   PHOS 2.3 (L) 09/27/2021   PROT 7.6 07/18/2022   ALBUMIN 3.7 07/18/2022   BILITOT 0.3 07/18/2022   ALKPHOS 69 07/18/2022   AST 25 07/18/2022   ALT 17 07/18/2022   ANIONGAP 8 07/18/2022    Assessment and Plan: *  CAP (community acquired pneumonia) Multifocal pneumonia on imaging COVID flu and RSV negative Currently on BiPAP IV cefepime, azithromycin and vancomycin for infectious coverage for severe pneumonia order set Blood cultures, sputum cultures, urine strep and Legionella, expanded respiratory panel Supplemental oxygen as needed Follow  Acute on chronic respiratory failure with hypoxia and hypercapnia (HCC) Decompensated respiratory failure in the setting of multifocal pneumonia and chronic respiratory failure with COPD on 2 L Currently on BiPAP Compensated respiratory acidosis on blood gas Started on IV cefepime and vancomycin in the ER Noted prior MRSA culture positive on 08/2021 COVID flu and RSV negative Will place on cefepime, azithromycin and vancomycin per severe pneumonia order set Blood cultures, respiratory cultures, expanded respiratory panel, urine strep Legionella IV Solu-Medrol, DuoNebs in the setting of decompensated COPD Follow  COPD exacerbation (HCC) IV Solu-Medrol, DuoNebs, severe pneumonia order set Continue BiPAP Titrate back to home 2 to 3 L nasal cannula as appropriate Follow  GERD (gastroesophageal reflux disease) PPI        Advance Care Planning:   Code Status: Full Code   Consults: None  Family Communication: No family at the bedside   Severity of Illness: The appropriate patient status for this patient is INPATIENT. Inpatient status is judged to be reasonable and necessary in order  to provide the required intensity of service to ensure the patient's safety. The patient's presenting symptoms, physical exam findings, and initial radiographic and laboratory data in the context of their chronic comorbidities is felt to place them at high risk for further clinical deterioration. Furthermore, it is not anticipated that the patient will be medically stable for discharge from the hospital within 2 midnights of admission.   * I certify that at the point of admission it is my clinical judgment that the patient will require inpatient hospital care spanning beyond 2 midnights from the point of admission due to high intensity of service, high risk for further deterioration and high frequency of surveillance required.*  Author: Deneise Lever, MD 07/18/2022 8:26 AM  For on call review [email or web address].

## 2022-07-18 NOTE — ED Provider Notes (Signed)
[facility] Provider Note    Event Date/Time   First MD Initiated Contact with Patient 07/18/22 [phone]     (approximate)   History   Shortness of Breath   HPI Level 5 caveat:  history/ROS limited by acute/critical illness Julia [name] is a 62 y.o. female with history of COPD who presents by EMS in severe respiratory distress.  She says she has been getting worse over the last few days and then her shortness of breath became severe tonight.  She is speaking only a couple of words at a time.  She received Solu-Medrol 125 mg IV and 2 albuterol treatments and route.  She was initially 82% on room air per EMS but she improved a little bit after treatment.  She is still in severe respiratory distress upon arrival but is able to talk little bit better.  She said that she think she has pneumonia because she is having some pain when she takes deep breath and coughs and she has been coughing up a lot of phlegm.  She also said that she was recently started on medication at an urgent care for possible pneumonia but she is not sure what.     Physical Exam   Triage Vital Signs: ED Triage Vitals  Enc Vitals Group     BP 07/18/22 0415 (!) 153/86     Pulse Rate 07/18/22 0415 97     Resp 07/18/22 0415 (!) 28     Temp 07/18/22 0415 98.5 F (36.9 C)     Temp Source 07/18/22 0415 Oral     SpO2 07/18/22 0415 96 %     Weight 07/18/22 0408 55.7 kg (122 lb 12.7 oz)     Height 07/18/22 0408 1.651 m (5\' 5" )     Head Circumference --      Peak Flow --      Pain Score --      Pain Loc --      Pain Edu? --      Excl. in [place]? --     Most recent vital signs: Vitals:   07/18/22 0500 07/18/22 0630  BP: (!) 168/87 (!) 160/82  Pulse: 99 84  Resp: (!) 35 (!) 25  Temp:    SpO2: 98% 98%     General: Awake, severe respiratory distress. CV:  Good peripheral perfusion.  Borderline tachycardia, regular rhythm. Resp:  Respiratory distress with intercostal retractions and  accessory muscle usage and tripoding.  Decreased air movement throughout with severe expiratory wheezes as well as some inspiratory wheezing. Abd:  No distention.  No tenderness to palpation. Other:  Awake and alert, no confusion.   ED Results / Procedures / Treatments   Labs (all labs ordered are listed, but only abnormal results are displayed) Labs Reviewed  LACTIC ACID, PLASMA - Abnormal; Notable for the following components:      Result Value   Lactic Acid, Venous 2.2 (*)    All other components within normal limits  COMPREHENSIVE METABOLIC PANEL - Abnormal; Notable for the following components:   Glucose, Bld 104 (*)    All other components within normal limits  CBC WITH DIFFERENTIAL/PLATELET - Abnormal; Notable for the following components:   Hemoglobin 10.0 (*)    HCT 33.5 (*)    MCH 25.6 (*)    MCHC 29.9 (*)    RDW 17.3 (*)    All other components within normal limits  BLOOD GAS, VENOUS - Abnormal; Notable for the following components:  pCO2, Ven 72 (*)    Bicarbonate 35.4 (*)    Acid-Base Excess 6.4 (*)    All other components within normal limits  CULTURE, BLOOD (SINGLE)  RESP PANEL BY RT-PCR (RSV, FLU A&B, COVID)  [id number]  CULTURE, BLOOD (SINGLE)  LACTIC ACID, PLASMA  PROTIME-INR  LIPASE, BLOOD  BRAIN NATRIURETIC PEPTIDE  TROPONIN I (HIGH SENSITIVITY)  TROPONIN I (HIGH SENSITIVITY)     EKG  ED ECG REPORT I, Hinda Kehr, the attending physician, personally viewed and interpreted this ECG.  Date: 07/18/2022 EKG Time: 4:22 AM Rate: 105 Rhythm: Sinus tachycardia QRS Axis: Right axis deviation Intervals: normal ST/T Wave abnormalities: Non-specific ST segment / T-wave changes, but no clear evidence of acute ischemia. Narrative Interpretation: no definitive evidence of acute ischemia; does not meet STEMI criteria.    RADIOLOGY I viewed and interpreted the patient's 1 view chest x-ray.  She has patchy opacities in both lower lobes concerning for  pneumonia.  Radiology confirms multilobar pneumonia as well as cardiomegaly.    PROCEDURES:  Critical Care performed: Yes, see critical care procedure note(s)  .1-3 Lead EKG Interpretation  Performed by: Hinda Kehr, MD Authorized by: Hinda Kehr, MD     Interpretation: normal     ECG rate:  95   ECG rate assessment: normal     Rhythm: sinus rhythm     Ectopy: none     Conduction: normal   .Critical Care  Performed by: Hinda Kehr, MD Authorized by: Hinda Kehr, MD   Critical care provider statement:    Critical care time (minutes):  40   Critical care time was exclusive of:  Separately billable procedures and treating other patients   Critical care was necessary to treat or prevent imminent or life-threatening deterioration of the following conditions:  Respiratory failure and sepsis   Critical care was time spent personally by me on the following activities:  Development of treatment plan with patient or surrogate, evaluation of patient's response to treatment, examination of patient, obtaining history from patient or surrogate, ordering and performing treatments and interventions, ordering and review of laboratory studies, ordering and review of radiographic studies, pulse oximetry, re-evaluation of patient's condition and review of old charts    MEDICATIONS ORDERED IN ED: Medications  vancomycin (VANCOREADY) IVPB 1250 mg/250 mL (1,250 mg Intravenous New Bag/Given 07/18/22 0644)  ipratropium-albuterol (DUONEB) 0.5-2.5 (3) MG/3ML nebulizer solution 3 mL (3 mLs Nebulization Given 07/18/22 0423)  lactated ringers bolus 1,000 mL (0 mLs Intravenous Stopped 07/18/22 0618)  ceFEPIme (MAXIPIME) 2 g in sodium chloride 0.9 % 100 mL IVPB (0 g Intravenous Stopped 07/18/22 0548)  lactated ringers bolus 500 mL (500 mLs Intravenous New Bag/Given 07/18/22 0645)    And  lactated ringers bolus 250 mL (0 mLs Intravenous Stopped 07/18/22 [id number])     IMPRESSION / MDM / [place] /  ED COURSE  I reviewed the triage vital signs and the nursing notes.                              Differential diagnosis includes, but is not limited to, pneumonia, COPD exacerbation, less likely PE or CHF.  Patient's presentation is most consistent with acute presentation with potential threat to life or bodily function.  Labs/studies ordered: High-sensitivity troponin, lactic acid, blood cultures x 2, respiratory viral panel, CBC with differential, lipase, CMP, pro time-INR, BNP, VBG, EKG, chest x-ray Interventions/Medications given: LR 30 mL/kg IV bolus, cefepime 2 g  IV, azithromycin 500 mg IV, BiPAP [facility] Course my include additional interventions or labs/studies not listed above.)  Suspect COPD exacerbation is the primary issue but cannot rule out acute viral infection or pneumonia which is the reason for the respiratory viral panel.  Labs pending.  I ordered an additional DuoNeb and the patient already received steroids.  I also ordered BiPAP to help support her with positive pressure ventilation.  Patient agrees with the plan.  She will need to be admitted given the hypoxia and degree of respiratory distress she was experiencing.  The patient is on the cardiac monitor to evaluate for evidence of arrhythmia and/or significant heart rate changes.   Clinical Course as of 07/18/22 [id number]  Nancy Fetter Jul 18, 2022  0459 [facility] Chest [place] 1 View I viewed and interpreted the patient's chest x-ray and it appears consistent with multilobar pneumonia.  The radiologist concurs.  Given the presence of the pneumonia and the hypoxia, and the borderline tachycardia but definitely heart rate greater than 90, I am going to call code sepsis.  Given that she has already been on doxycycline, I am going to treat more broadly with cefepime 2 g IV and vancomycin 1 g IV plus pharmacy consult.  I will also order LR 1 L IV bolus while awaiting the lactic acid.  It may be artificially elevated due to respiratory failure  rather than simply as a marker of sepsis. [CF]  [id number] Normal high-sensitivity troponin.  Normal comprehensive metabolic panel.  Venous blood gas is notable for pCO2 of 72 but she is already on BiPAP [CF]  0513 Lactic Acid, Venous(!!): 2.2 Given that the patient had a normal echocardiogram in February 2023 with an EF of 60 to 65% and that she technically meets criteria for severe sepsis with a lactic of 2.2, I ordered an additional 750 mL of LR to meet the 30 mL/kg goal [CF]  0515 Resp panel by RT-PCR (RSV, Flu A&B, Covid) Anterior Nasal Swab Negative respiratory viral panel [CF]  [phone] Consulting hospitalist for admission [CF]  [phone] Consulted with Dr. Damita Dunnings with the hospitalist service who will put in admission orders and pass along the admission to her daytime colleague [CF]    Clinical Course User Index [CF] Hinda Kehr, MD     FINAL CLINICAL IMPRESSION(S) / ED DIAGNOSES   Final diagnoses:  Acute respiratory failure with hypoxia and hypercapnia ([place])  COPD exacerbation ([place])  Multifocal pneumonia  Severe sepsis ([place])     Rx / DC Orders   ED Discharge Orders     None        Note:  This document was prepared using Dragon voice recognition software and may include unintentional dictation errors.   Hinda Kehr, MD 07/18/22 [phone]

## 2022-07-18 NOTE — Consult Note (Signed)
Pharmacy Antibiotic Note  Julia [name] is a 62 y.o. female admitted on 07/18/2022 with pneumonia. PMH significant for COPD (on 3L), GERD, depression. On presentation, patient is hypoxic and hypercapnic. CXR concerning for multifocal pneumonia at the lung bases. Patient has history of positive MRSA PCR screen 08/2021, but most recent MRSA PCR screen 01/2022 was negative. No documented allergies or intolerances to antimicrobials. Pharmacy has been consulted for vancomycin dosing.  Plan: Day 1 of antibiotics Give vancomycin 1250 mg IV x1 followed by 1250 mg IV Q24H. Goal AUC 400-550. Expected AUC: 541 Expected Css min: 11.4 SCr used: 0.8 (actual 0.69) Weight used: TBW (IBW>TBW), Vd used: 0.72 (BMI 20.4) Patient is also on cefepime 2 g IV Q8H and azithromycin 500 mg IV Q24H Continue to monitor renal function and follow culture results   Height: 5\' 5"  (165.1 cm) Weight: 55.7 kg (122 lb 12.7 oz) IBW/kg (Calculated) : 57  Temp (24hrs), Avg:98.5 F (36.9 C), Min:98.5 F (36.9 C), Max:98.5 F (36.9 C)  Recent Labs  Lab 07/18/22 0411 07/18/22 0637  WBC 7.1  --   CREATININE 0.69  --   LATICACIDVEN 2.2* 1.2    Estimated Creatinine Clearance: 64.1 mL/min (by C-G formula based on SCr of 0.69 mg/dL).    Allergies  Allergen Reactions   Banana Nausea And Vomiting    Antimicrobials this admission: 3/17 Vancomycin >>  3/17 Cefepime >>  3/17 Azithromycin >>  Dose adjustments this admission: N/A  Microbiology results: 3/17 Bcx (single): IP 3/17 Sputum: ordered  3/17 MRSA PCR: ordered  Thank you for allowing pharmacy to be a part of this patient's care.  Gretel Acre, PharmD PGY1 Pharmacy Resident 07/18/2022 8:42 AM

## 2022-07-18 NOTE — Assessment & Plan Note (Signed)
PPI ?

## 2022-07-18 NOTE — ED Notes (Signed)
Advised nurse that patient has ready bed 

## 2022-07-18 NOTE — ED Triage Notes (Signed)
Patient C/O SOB that began about 30 minutes ago.

## 2022-07-19 DIAGNOSIS — J4540 Moderate persistent asthma, uncomplicated: Secondary | ICD-10-CM | POA: Insufficient documentation

## 2022-07-19 DIAGNOSIS — J189 Pneumonia, unspecified organism: Secondary | ICD-10-CM | POA: Diagnosis not present

## 2022-07-19 HISTORY — DX: Moderate persistent asthma, uncomplicated: J45.40

## 2022-07-19 LAB — RESPIRATORY PANEL BY PCR

## 2022-07-19 LAB — COMPREHENSIVE METABOLIC PANEL
ALT: 16 U/L (ref 0–44)
AST: 20 U/L (ref 15–41)
Albumin: 3.3 g/dL — ABNORMAL LOW (ref 3.5–5.0)
Alkaline Phosphatase: 61 U/L (ref 38–126)
Anion gap: 9 (ref 5–15)
BUN: 18 mg/dL (ref 8–23)
CO2: 26 mmol/L (ref 22–32)
Calcium: 9.8 mg/dL (ref 8.9–10.3)
Chloride: 102 mmol/L (ref 98–111)
Creatinine, Ser: 0.56 mg/dL (ref 0.44–1.00)
GFR, Estimated: >60 mL/min (ref >60)
Glucose, Bld: 130 mg/dL — ABNORMAL HIGH (ref 70–99)
Potassium: 4.4 mmol/L (ref 3.5–5.1)
Sodium: 137 mmol/L (ref 135–145)
Total Bilirubin: 0.5 mg/dL (ref 0.3–1.2)
Total Protein: 7.4 g/dL (ref 6.5–8.1)

## 2022-07-19 LAB — CBC
HCT: 31.8 % — ABNORMAL LOW (ref 36.0–46.0)
Hemoglobin: 9.8 g/dL — ABNORMAL LOW (ref 12.0–15.0)
MCH: 25.4 pg — ABNORMAL LOW (ref 26.0–34.0)
MCHC: 30.8 g/dL (ref 30.0–36.0)
MCV: 82.4 fL (ref 80.0–100.0)
Platelets: 194 10*3/uL (ref 150–400)
RBC: 3.86 MIL/uL — ABNORMAL LOW (ref 3.87–5.11)
RDW: 17 % — ABNORMAL HIGH (ref 11.5–15.5)
WBC: 13.3 10*3/uL — ABNORMAL HIGH (ref 4.0–10.5)
nRBC: 0 % (ref 0.0–0.2)

## 2022-07-19 LAB — STREP PNEUMONIAE URINARY ANTIGEN: Strep Pneumo Urinary Antigen: POSITIVE — AB

## 2022-07-19 LAB — PROCALCITONIN: Procalcitonin: <0.1 ng/mL

## 2022-07-19 LAB — MRSA NEXT GEN BY PCR, NASAL: MRSA by PCR Next Gen: NOT DETECTED

## 2022-07-19 MED ORDER — METHYLPREDNISOLONE SODIUM SUCC 40 MG IJ SOLR: 40.0000 mg | Freq: Two times a day (BID) | INTRAMUSCULAR | Status: DC

## 2022-07-19 MED ORDER — GABAPENTIN 300 MG PO CAPS
300.0000 mg | ORAL_CAPSULE | Freq: Three times a day (TID) | ORAL | Status: DC
  Administered 2022-07-19 – 2022-07-20 (×5): 300 mg via ORAL
  Filled 2022-07-19 (×5): qty 1

## 2022-07-19 MED ORDER — SERTRALINE HCL 50 MG PO TABS
150.0000 mg | ORAL_TABLET | Freq: Every day | ORAL | Status: DC
  Administered 2022-07-19 – 2022-07-20 (×2): 150 mg via ORAL
  Filled 2022-07-19 (×2): qty 3

## 2022-07-19 MED ORDER — IPRATROPIUM-ALBUTEROL 0.5-2.5 (3) MG/3ML IN SOLN
3.0000 mL | Freq: Four times a day (QID) | RESPIRATORY_TRACT | Status: DC
  Administered 2022-07-19 – 2022-07-20 (×5): 3 mL via RESPIRATORY_TRACT
  Filled 2022-07-19 (×5): qty 3

## 2022-07-19 MED ORDER — METHYLPREDNISOLONE SODIUM SUCC 125 MG IJ SOLR
80.0000 mg | INTRAMUSCULAR | Status: DC
  Administered 2022-07-19 – 2022-07-20 (×2): 80 mg via INTRAVENOUS
  Filled 2022-07-19 (×2): qty 2

## 2022-07-19 MED ORDER — VARENICLINE TARTRATE 0.5 MG PO TABS
1.0000 mg | ORAL_TABLET | Freq: Two times a day (BID) | ORAL | Status: DC
  Administered 2022-07-19 – 2022-07-20 (×3): 1 mg via ORAL
  Filled 2022-07-19 (×3): qty 2

## 2022-07-19 MED ORDER — MELATONIN 5 MG PO TABS
5.0000 mg | ORAL_TABLET | Freq: Once | ORAL | Status: AC
  Administered 2022-07-19: 5 mg via ORAL
  Filled 2022-07-19: qty 1

## 2022-07-19 MED ORDER — ALBUTEROL SULFATE (2.5 MG/3ML) 0.083% IN NEBU: 2.5000 mg | INHALATION_SOLUTION | RESPIRATORY_TRACT | Status: DC | PRN

## 2022-07-19 NOTE — Progress Notes (Addendum)
PROGRESS NOTE    [name]  [id number] DOB: 1961/02/10 DOA: 07/18/2022 PCP: Romualdo Bolk, FNP  Outpatient Specialists: [facility]    Brief Narrative:   From admission h and p  Julia [name] is a 62 y.o. female with medical history significant of chronic respiratory failure on 3 L at home, COPD, GERD, depression presenting with acute on chronic respiratory failure with hypoxia and hypercarbia, multifocal pneumonia, COPD exacerbation.  Patient reports 2 to 3 days of increased work of breathing.  Positive cough, wheezing, sputum production.  No fevers or chills.  No nausea or vomiting.  Minimal to mild chest pain.  No abdominal pain.  No hemiparesis or confusion.  Prior smoker.  Patient states she quit approximately 1 to 2 weeks ago.  Has been using home inhalers with minimal improvement in symptoms.  No reported sick contacts.  Was initially evaluated by EMS and found to be in severe respiratory distress.  Was given IV Solu-Medrol as well as 2 albuterol treatments and brought to the ER Presented to the ER afebrile, respiratory rate up into the 30s being transition from 4 L nasal cannula to BiPAP  Assessment & Plan:   Principal Problem:   CAP (community acquired pneumonia) Active Problems:   Acute on chronic respiratory failure with hypoxia and hypercapnia (HCC)   COPD exacerbation (HCC)   GERD (gastroesophageal reflux disease)  # COPD with acute exacerbation # CAP? Infiltrates on CXR but has a hx of pulmonary infiltrates, followed by pulm for this, had CT two months ago that showed them to be persistent. Covid/flu/rsv neg. Trops neg, bnp wnl. Failed outpt mgmt with oral steroids and doxycycline. Has outpt pulm f/u scheduled next month. Mrsa swab neg - continue cefepime given concern for cap and recent antibiotic use. Stop vancomycin and azithromycin, - continue methylpred IV for now - scheduled and prn breathing treatments - f/u procal - f/u rvp - f/u sputum  culture and blood culture results - f/u legionella and strep urine antigens  # Acute on chronic hypoxic respiratory failure Here dyspneic. On 2.5 liters at home - [place] O2, wean as able  # OSA - home cpap qhs  # Chronic pain - home gabapentin  # GAD - home sertraline, alprazolam  # Smoking cessation - home chantix     DVT prophylaxis: lovenox Code Status: full Family Communication: husband updated @ bedside  Level of care: Progressive Status is: Inpatient Remains inpatient appropriate because: severity of illness    Consultants:  none  Procedures: none  Antimicrobials:  See above    Subjective: Reports mild improvement in dyspnea and cough  Objective: Vitals:   07/18/22 2033 07/18/22 2324 07/19/22 0806 07/19/22 0811  BP:  (!) 164/81 (!) 151/90   Pulse:  79 97   Resp:  20 20   Temp:  97.9 F (36.6 C) 97.6 F (36.4 C)   TempSrc:   Oral   SpO2: 99% 96% 99% 100%  Weight:      Height:        Intake/Output Summary (Last 24 hours) at 07/19/2022 0935 Last data filed at 07/19/2022 0650 Gross per 24 hour  Intake 1059.84 ml  Output 2700 ml  Net -1640.16 ml   Filed Weights   07/18/22 0408  Weight: 55.7 kg    Examination:  General exam: Appears calm and comfortable  Respiratory system: scattered wheeze and rhonchi Cardiovascular system: S1 & S2 heard, RRR. No JVD, murmurs, rubs, gallops or clicks. No pedal edema. Gastrointestinal  system: Abdomen is nondistended, soft and nontender. No organomegaly or masses felt. Normal bowel sounds heard. Central nervous system: Alert and oriented. No focal neurological deficits. Extremities: Symmetric 5 x 5 power. Skin: No rashes, lesions or ulcers Psychiatry: Judgement and insight appear normal. Mood & affect appropriate.     Data Reviewed: I have personally reviewed following labs and imaging studies  CBC: Recent Labs  Lab 07/18/22 0411 07/19/22 0522  WBC 7.1 13.3*  NEUTROABS 5.3  --   HGB 10.0* 9.8*   HCT 33.5* 31.8*  MCV 85.7 82.4  PLT PLATELET CLUMPS NOTED ON SMEAR, UNABLE TO ESTIMATE [id number]   Basic Metabolic Panel: Recent Labs  Lab 07/18/22 0411 07/19/22 0522  NA 136 137  K 4.9 4.4  CL 99 102  CO2 29 26  GLUCOSE 104* 130*  BUN 18 18  CREATININE 0.69 0.56  CALCIUM 9.3 9.8   GFR: Estimated Creatinine Clearance: 64.1 mL/min (by C-G formula based on SCr of 0.56 mg/dL). Liver Function Tests: Recent Labs  Lab 07/18/22 0411 07/19/22 0522  AST 25 20  ALT 17 16  ALKPHOS 69 61  BILITOT 0.3 0.5  PROT 7.6 7.4  ALBUMIN 3.7 3.3*   Recent Labs  Lab 07/18/22 0411  LIPASE 28   No results for input(s): "AMMONIA" in the last 168 hours. Coagulation Profile: Recent Labs  Lab 07/18/22 0411  INR 0.9   Cardiac Enzymes: No results for input(s): "CKTOTAL", "CKMB", "CKMBINDEX", "TROPONINI" in the last 168 hours. BNP (last 3 results) No results for input(s): "PROBNP" in the last 8760 hours. HbA1C: No results for input(s): "HGBA1C" in the last 72 hours. CBG: No results for input(s): "GLUCAP" in the last 168 hours. Lipid Profile: No results for input(s): "CHOL", "HDL", "LDLCALC", "TRIG", "CHOLHDL", "LDLDIRECT" in the last 72 hours. Thyroid Function Tests: No results for input(s): "TSH", "T4TOTAL", "FREET4", "T3FREE", "THYROIDAB" in the last 72 hours. Anemia Panel: No results for input(s): "VITAMINB12", "FOLATE", "FERRITIN", "TIBC", "IRON", "RETICCTPCT" in the last 72 hours. Urine analysis:    Component Value Date/Time   COLORURINE YELLOW (A) 09/23/2021 1424   APPEARANCEUR CLEAR (A) 09/23/2021 1424   LABSPEC 1.023 09/23/2021 1424   PHURINE 5.0 09/23/2021 1424   GLUCOSEU 50 (A) 09/23/2021 1424   HGBUR NEGATIVE 09/23/2021 [place] 09/23/2021 1424   KETONESUR NEGATIVE 09/23/2021 1424   PROTEINUR NEGATIVE 09/23/2021 1424   NITRITE NEGATIVE 09/23/2021 1424   LEUKOCYTESUR NEGATIVE 09/23/2021 1424   Sepsis  Labs: @LABRCNTIP (procalcitonin:4,lacticidven:4)  ) Recent Results (from the past 240 hour(s))  Blood culture (routine single)     Status: None (Preliminary result)   Collection Time: 07/18/22  4:11 AM   Specimen: BLOOD  Result Value Ref Range Status   Specimen Description   Final    BLOOD LEFT ANTECUBITAL Performed at [facility], [address] [place], [place] [address]    Special Requests   Final    BOTTLES DRAWN AEROBIC AND ANAEROBIC Blood Culture adequate volume Performed at [facility], [address] [place], [place] [address]    Culture   Final    NO GROWTH 1 DAY Performed at [facility], [place] [address], [place], [place] [address]    Report Status PENDING  Incomplete  Resp panel by RT-PCR (RSV, Flu A&B, Covid) Anterior Nasal Swab     Status: None   Collection Time: 07/18/22  4:14 AM   Specimen: Anterior Nasal Swab  Result Value Ref Range Status   SARS Coronavirus 2  by RT PCR NEGATIVE NEGATIVE Final    Comment: (NOTE) SARS-CoV-2 target nucleic acids are NOT DETECTED.  The SARS-CoV-2 RNA is generally detectable in upper respiratory specimens during the acute phase of infection. The lowest concentration of SARS-CoV-2 viral copies this assay can detect is 138 copies/mL. A negative result does not preclude SARS-Cov-2 infection and should not be used as the sole basis for treatment or other patient management decisions. A negative result may occur with  improper specimen collection/handling, submission of specimen other than nasopharyngeal swab, presence of viral mutation(s) within the areas targeted by this assay, and inadequate number of viral copies(<138 copies/mL). A negative result must be combined with clinical observations, patient history, and epidemiological information. The expected result is Negative.  Fact Sheet for Patients:  EntrepreneurPulse.com.au  Fact Sheet for Healthcare Providers:   IncredibleEmployment.be  This test is no t yet approved or cleared by the [place] FDA and  has been authorized for detection and/or diagnosis of SARS-CoV-2 by FDA under an Emergency Use Authorization (EUA). This EUA will remain  in effect (meaning this test can be used) for the duration of the COVID-19 declaration under Section 564(b)(1) of the Act, 21 U.S.C.section 360bbb-3(b)(1), unless the authorization is terminated  or revoked sooner.       Influenza A by PCR NEGATIVE NEGATIVE Final   Influenza B by PCR NEGATIVE NEGATIVE Final    Comment: (NOTE) The Xpert Xpress SARS-CoV-2/FLU/RSV plus assay is intended as an aid in the diagnosis of influenza from Nasopharyngeal swab specimens and should not be used as a sole basis for treatment. Nasal washings and aspirates are unacceptable for Xpert Xpress SARS-CoV-2/FLU/RSV testing.  Fact Sheet for Patients: EntrepreneurPulse.com.au  Fact Sheet for Healthcare Providers: IncredibleEmployment.be  This test is not yet approved or cleared by the [place] FDA and has been authorized for detection and/or diagnosis of SARS-CoV-2 by FDA under an Emergency Use Authorization (EUA). This EUA will remain in effect (meaning this test can be used) for the duration of the COVID-19 declaration under Section 564(b)(1) of the Act, 21 U.S.C. section 360bbb-3(b)(1), unless the authorization is terminated or revoked.     Resp Syncytial Virus by PCR NEGATIVE NEGATIVE Final    Comment: (NOTE) Fact Sheet for Patients: EntrepreneurPulse.com.au  Fact Sheet for Healthcare Providers: IncredibleEmployment.be  This test is not yet approved or cleared by the [place] FDA and has been authorized for detection and/or diagnosis of SARS-CoV-2 by FDA under an Emergency Use Authorization (EUA). This EUA will remain in effect (meaning this test can be used) for  the duration of the COVID-19 declaration under Section 564(b)(1) of the Act, 21 U.S.C. section 360bbb-3(b)(1), unless the authorization is terminated or revoked.  Performed at [facility], [address] [place], [place] [address]   Culture, blood (single)     Status: None (Preliminary result)   Collection Time: 07/18/22  5:16 AM   Specimen: BLOOD  Result Value Ref Range Status   Specimen Description   Final    BLOOD BLOOD LEFT FOREARM Performed at [facility], [address], [place], [place] [address]    Special Requests   Final    BOTTLES DRAWN AEROBIC AND ANAEROBIC Blood Culture results may not be optimal due to an inadequate volume of blood received in culture bottles Performed at [facility], [address] [place], [place] [address]    Culture   Final    NO GROWTH 1 DAY Performed at [facility], [address], [place], [place] [address]    Report Status PENDING  Incomplete  Expectorated Sputum Assessment w Gram Stain, Rflx to Resp Cult     Status: None   Collection Time: 07/18/22 11:15 AM   Specimen: Sputum  Result Value Ref Range Status   Specimen Description SPUTUM  Final   Special Requests NONE  Final   Sputum evaluation   Final    THIS SPECIMEN IS ACCEPTABLE FOR SPUTUM CULTURE Performed at [facility], [address], [place], [place] [address]    Report Status 07/18/2022 FINAL  Final  Culture, Respiratory w Gram Stain     Status: None (Preliminary result)   Collection Time: 07/18/22 11:15 AM   Specimen: SPU  Result Value Ref Range Status   Specimen Description   Final    SPUTUM Performed at [facility], [address], [place], [place] [address]    Special Requests   Final    NONE Reflexed from [id number] Performed at [facility], [address] [place], [place] [address]    Gram Stain   Final    FEW WBC PRESENT, PREDOMINANTLY PMN MODERATE GRAM POSITIVE COCCI IN PAIRS RARE  YEAST Performed at [facility], [place] [address] [place], [place] [address]    Culture PENDING  Incomplete   Report Status PENDING  Incomplete         Radiology Studies: DG Chest Port 1 View  Result Date: 07/18/2022 CLINICAL DATA:  Questionable sepsis EXAM: PORTABLE CHEST 1 VIEW COMPARISON:  04/19/2022 FINDINGS: Patchy infiltrate in the bilateral lower lungs. Cardiomegaly and aortic tortuosity. There is a sizable hiatal hernia. No visible effusion or pneumothorax. COPD changes of on a chronic basis. IMPRESSION: 1. Multifocal pneumonia at the lung bases. 2. Cardiomegaly, hiatal hernia, and chronic lung disease. Electronically Signed   By: Jorje Guild M.D.   On: 07/18/2022 04:35        Scheduled Meds:  enoxaparin (LOVENOX) injection  40 mg Subcutaneous Q24H   ipratropium-albuterol  3 mL Nebulization Q4H   predniSONE  40 mg Oral Q breakfast   Continuous Infusions:  sodium chloride 50 mL/hr at 07/19/22 [id number]   azithromycin 500 mg (07/19/22 0835)   ceFEPime (MAXIPIME) IV Stopped (07/19/22 [id number])   vancomycin Stopped (07/18/22 2328)     LOS: 1 day     Desma Maxim, MD [facility]   If 7PM-7AM, please contact night-coverage [email or web address] Password [facility] 07/19/2022, 9:36 AM

## 2022-07-19 NOTE — Progress Notes (Signed)
PHARMACIST - PHYSICIAN COMMUNICATION   CONCERNING: Methylprednisolone IV    Current order: Methylprednisolone IV 40mg  IV every 12 hours     DESCRIPTION: Per [place] Protocol:   IV methylprednisolone will be converted to either a q12h or q24h frequency with the same total daily dose (TDD).  Ordered Dose: 1 to 125 mg TDD; convert to: TDD q24h.  Ordered Dose: 126 to 250 mg TDD; convert to: TDD div q12h.  Ordered Dose: >250 mg TDD; DAW.  Order has been adjusted to: Methylprednisolone IV 80mg  IV every 24 hours   Pernell Dupre , PharmD, BCPS Clinical Pharmacist  07/19/2022 9:44 AM

## 2022-07-20 DIAGNOSIS — J204 Acute bronchitis due to parainfluenza virus: Secondary | ICD-10-CM | POA: Insufficient documentation

## 2022-07-20 DIAGNOSIS — J189 Pneumonia, unspecified organism: Secondary | ICD-10-CM | POA: Diagnosis not present

## 2022-07-20 LAB — LEGIONELLA PNEUMOPHILA SEROGP 1 UR AG: L. pneumophila Serogp 1 Ur Ag: NEGATIVE

## 2022-07-20 MED ORDER — PREDNISONE 10 MG PO TABS: ORAL_TABLET | ORAL | 0 refills | Status: DC

## 2022-07-20 MED ORDER — LEVOFLOXACIN 750 MG PO TABS: 750.0000 mg | ORAL_TABLET | Freq: Every day | ORAL | 0 refills | Status: AC

## 2022-07-20 NOTE — Progress Notes (Signed)
DC instructions,  COPD teaching, med list reviewed with pt who verbalized understanding.  Tele and IV removed.  Pt to f/u with PCP and pulmonary.  Awaiting family's arrival, to be discharged via wheelchair

## 2022-07-20 NOTE — TOC Initial Note (Signed)
Transition of Care [facility]) - Initial/Assessment Note    Patient Details  Name: Julia [name] MRN: [id number] Date of Birth: 05-Nov-1960  Transition of Care [facility]) CM/SW Contact:    Laurena Slimmer, RN Phone Number: 07/20/2022, 3:33 PM  Clinical Narrative:                  Transition of Care (TOC) Screening Note   Patient Details  Name: Julia [name] Date of Birth: 1960-06-11   Transition of Care [facility]) CM/SW Contact:    Laurena Slimmer, RN Phone Number: 07/20/2022, 3:33 PM    Transition of Care Department [facility]) has reviewed patient and no TOC needs have been identified at this time. We will continue to monitor patient advancement through interdisciplinary progression rounds. If new patient transition needs arise, please place a TOC consult.          Patient Goals and CMS Choice            Expected Discharge Plan and Services         Expected Discharge Date: 07/20/22                                    Prior Living Arrangements/Services                       Activities of Daily Living Home Assistive Devices/Equipment: None ADL Screening (condition at time of admission) Patient's cognitive ability adequate to safely complete daily activities?: Yes Is the patient deaf or have difficulty hearing?: No Does the patient have difficulty seeing, even when wearing glasses/contacts?: No Does the patient have difficulty concentrating, remembering, or making decisions?: No Patient able to express need for assistance with ADLs?: No Does the patient have difficulty dressing or bathing?: No Independently performs ADLs?: Yes (appropriate for developmental age) Does the patient have difficulty walking or climbing stairs?: No Weakness of Legs: Both Weakness of Arms/Hands: None  Permission Sought/Granted                  Emotional Assessment              Admission diagnosis:  COPD exacerbation ([place]) [J44.1] CAP (community acquired  pneumonia) [J18.9] Severe sepsis ([place]) [A41.9, R65.20] Acute on chronic respiratory failure with hypoxia ([place]) [J96.21] Acute respiratory failure with hypoxia and hypercapnia ([place]) [J96.01, J96.02] Multifocal pneumonia [J18.9] Patient Active Problem List   Diagnosis Date Noted   Parainfluenza virus bronchitis 07/20/2022   Moderate persistent asthma without complication [id number]   GERD (gastroesophageal reflux disease) 07/18/2022   Acute on chronic respiratory failure with hypoxia and hypercapnia ([place]) 02/26/2022   Pneumonia due to Streptococcus ([place]) 09/30/2021   Acute pulmonary edema ([place]) 09/30/2021   AKI (acute kidney injury) ([place]) 09/30/2021   CAP (community acquired pneumonia) 09/23/2021   Respiratory distress    COPD with acute exacerbation ([place]) 06/18/2021   Nicotine dependence 06/18/2021   Hypomagnesemia 06/18/2021   Elevated troponin 06/18/2021   Lung nodule 03/18/2021   Atypical pneumonia 03/18/2021   Severe sepsis ([place]) 03/18/2021   Depression with anxiety 03/18/2021   Iron deficiency anemia 03/18/2021   Incisional hernia, without obstruction or gangrene 04/22/2020   Chronic low back pain 07/15/2016   Acute respiratory failure with hypoxia ([place]) 11/08/2015   COPD exacerbation ([place]) 04/07/2015   Tobacco use disorder 12/30/2011   PCP:  Romualdo Bolk, FNP Pharmacy:   [facility] [place] [place] [address] Phone: [phone] Fax: [place] [address] Phone: [phone] Fax: [phone]     Social Determinants of Health (SDOH) Social History: [place]: No Food Insecurity (07/18/2022)  Housing: Low Risk  (07/18/2022)  Transportation Needs: No Transportation Needs (07/18/2022)  Utilities: Not At Risk (07/18/2022)  Tobacco Use: High Risk (07/18/2022)   SDOH Interventions:     Readmission Risk  Interventions    02/28/2022    1:27 PM  Readmission Risk Prevention Plan  Transportation Screening Complete  PCP or Specialist Appt within 3-5 Days Complete  Social Work Consult for [place] Planning/Counseling Complete  Palliative Care Screening Not Applicable  Medication Review Press photographer) Complete

## 2022-07-20 NOTE — Discharge Summary (Signed)
Julia [name] [id number] DOB: 1961/03/28 DOA: 07/18/2022  PCP: Romualdo Bolk, FNP  Admit date: 07/18/2022 Discharge date: 07/20/2022  Time spent: 35 minutes  Recommendations for Outpatient Follow-up:  Pulmonology f/u next month as scheduled     Discharge Diagnoses:  Principal Problem:   CAP (community acquired pneumonia) Active Problems:   Acute on chronic respiratory failure with hypoxia and hypercapnia (HCC)   COPD exacerbation ([place])   GERD (gastroesophageal reflux disease)   Discharge Condition: stable  Diet recommendation: heart healthy  Filed Weights   07/18/22 0408  Weight: 55.7 kg    History of present illness:  From admission h and p  Julia [name] is a 62 y.o. female with medical history significant of chronic respiratory failure on 3 L at home, COPD, GERD, depression presenting with acute on chronic respiratory failure with hypoxia and hypercarbia, multifocal pneumonia, COPD exacerbation.  Patient reports 2 to 3 days of increased work of breathing.  Positive cough, wheezing, sputum production.  No fevers or chills.  No nausea or vomiting.  Minimal to mild chest pain.  No abdominal pain.  No hemiparesis or confusion.  Prior smoker.  Patient states she quit approximately 1 to 2 weeks ago.  Has been using home inhalers with minimal improvement in symptoms.  No reported sick contacts.  Was initially evaluated by EMS and found to be in severe respiratory distress.  Was given IV Solu-Medrol as well as 2 albuterol treatments and brought to the ER   Hospital Course:  Hx COPD presenting with dyspnea. Infiltrates seen on CXR, has hx of infiltrates seen on serial imaging. W/u significant for parainfluenza virus positive and strep pneumo urine antigen positive. Did not require more o2 than her baseline but was more dyspneic than her baseline. Symptomatically she is improving and ambulated satisfactorally and requests discharge. Will complete a prednisone taper and will rx  levofloxacin for the possible strep pneumo. Has pulm f/u scheduled for next month.   Procedures: none   Consultations: none  Discharge Exam: Vitals:   07/20/22 0724 07/20/22 1229  BP: (!) 157/72 136/81  Pulse: 60 77  Resp: 16 18  Temp: 98.4 F (36.9 C) 98.3 F (36.8 C)  SpO2: 100% 99%    General: NAD Cardiovascular: RRR Respiratory: Scattered rhonchi  Discharge Instructions   Discharge Instructions     Diet - low sodium heart healthy   Complete by: As directed    Increase activity slowly   Complete by: As directed       Allergies as of 07/20/2022       Reactions   Banana Nausea And Vomiting        Medication List     STOP taking these medications    dextromethorphan-guaiFENesin 30-600 MG 12hr tablet Commonly known as: MUCINEX DM   doxycycline 100 MG capsule Commonly known as: VIBRAMYCIN       TAKE these medications    acetaminophen 500 MG tablet Commonly known as: TYLENOL Take 1,000 mg by mouth every 6 (six) hours as needed.   albuterol 108 (90 Base) MCG/ACT inhaler Commonly known as: VENTOLIN HFA Inhale 2 puffs into the lungs every 6 (six) hours as needed for wheezing or shortness of breath.   albuterol (2.5 MG/3ML) 0.083% nebulizer solution Commonly known as: PROVENTIL Take 3 mLs (2.5 mg total) by nebulization every 2 (two) hours as needed for wheezing or shortness of breath.   ALPRAZolam 0.5 MG tablet Commonly known as: XANAX Take 0.5 mg by mouth 2 (two)  times daily as needed.   docusate sodium 100 MG capsule Commonly known as: COLACE Take 100 mg by mouth daily as needed for mild constipation.   ferrous sulfate 325 (65 FE) MG tablet Take 1 tablet (325 mg total) by mouth daily.   gabapentin 300 MG capsule Commonly known as: NEURONTIN Take 300 mg by mouth 3 (three) times daily.   ipratropium-albuterol 0.5-2.5 (3) MG/3ML Soln Commonly known as: DUONEB Take 3 mLs by nebulization every 4-6 (four to six) hours while awake as needed  during acute COPD exacerbation for up to 5 days   levofloxacin 750 MG tablet Commonly known as: Levaquin Take 1 tablet (750 mg total) by mouth daily for 3 days.   magnesium oxide 400 MG tablet Commonly known as: MAG-OX Take 400 mg by mouth daily.   meloxicam 15 MG tablet Commonly known as: MOBIC Take 15 mg by mouth daily.   pantoprazole 40 MG tablet Commonly known as: PROTONIX Take 40 mg by mouth daily.   predniSONE 10 MG tablet Commonly known as: DELTASONE 4 tabs daily for 3 days then 3 tabs daily for 3 days then 2 tabs daily for 3 days then 1 tab daily for 3 days What changed:  medication strength how much to take how to take this when to take this additional instructions   sertraline 100 MG tablet Commonly known as: ZOLOFT Take 150 mg by mouth daily.   Trelegy Ellipta 100-62.5-25 MCG/ACT Aepb Generic drug: Fluticasone-Umeclidin-Vilant Inhale 1 puff into the lungs daily.   varenicline 1 MG tablet Commonly known as: CHANTIX Take 1 mg by mouth 2 (two) times daily.       Allergies  Allergen Reactions   Banana Nausea And Vomiting    Follow-up Information     Your pulmonologist (lung doctor) Follow up.   Why: next month as scheduled                 The results of significant diagnostics from this hospitalization (including imaging, microbiology, ancillary and laboratory) are listed below for reference.    Significant Diagnostic Studies: DG Chest Port 1 View  Result Date: 07/18/2022 CLINICAL DATA:  Questionable sepsis EXAM: PORTABLE CHEST 1 VIEW COMPARISON:  04/19/2022 FINDINGS: Patchy infiltrate in the bilateral lower lungs. Cardiomegaly and aortic tortuosity. There is a sizable hiatal hernia. No visible effusion or pneumothorax. COPD changes of on a chronic basis. IMPRESSION: 1. Multifocal pneumonia at the lung bases. 2. Cardiomegaly, hiatal hernia, and chronic lung disease. Electronically Signed   By: Jorje Guild M.D.   On: 07/18/2022 04:35     Microbiology: Recent Results (from the past 240 hour(s))  Blood culture (routine single)     Status: None (Preliminary result)   Collection Time: 07/18/22  4:11 AM   Specimen: BLOOD  Result Value Ref Range Status   Specimen Description BLOOD LEFT ANTECUBITAL  Final   Special Requests   Final    BOTTLES DRAWN AEROBIC AND ANAEROBIC Blood Culture adequate volume   Culture   Final    NO GROWTH 2 DAYS Performed at [facility], [address], [place], [place] [address]    Report Status PENDING  Incomplete  Resp panel by RT-PCR (RSV, Flu A&B, Covid) Anterior Nasal Swab     Status: None   Collection Time: 07/18/22  4:14 AM   Specimen: Anterior Nasal Swab  Result Value Ref Range Status   SARS Coronavirus 2 by RT PCR NEGATIVE NEGATIVE Final    Comment: (NOTE) SARS-CoV-2 target nucleic  acids are NOT DETECTED.  The SARS-CoV-2 RNA is generally detectable in upper respiratory specimens during the acute phase of infection. The lowest concentration of SARS-CoV-2 viral copies this assay can detect is 138 copies/mL. A negative result does not preclude SARS-Cov-2 infection and should not be used as the sole basis for treatment or other patient management decisions. A negative result may occur with  improper specimen collection/handling, submission of specimen other than nasopharyngeal swab, presence of viral mutation(s) within the areas targeted by this assay, and inadequate number of viral copies(<138 copies/mL). A negative result must be combined with clinical observations, patient history, and epidemiological information. The expected result is Negative.  Fact Sheet for Patients:  EntrepreneurPulse.com.au  Fact Sheet for Healthcare Providers:  IncredibleEmployment.be  This test is no t yet approved or cleared by the [place] FDA and  has been authorized for detection and/or diagnosis of SARS-CoV-2 by FDA under an Emergency Use  Authorization (EUA). This EUA will remain  in effect (meaning this test can be used) for the duration of the COVID-19 declaration under Section 564(b)(1) of the Act, 21 U.S.C.section 360bbb-3(b)(1), unless the authorization is terminated  or revoked sooner.       Influenza A by PCR NEGATIVE NEGATIVE Final   Influenza B by PCR NEGATIVE NEGATIVE Final    Comment: (NOTE) The Xpert Xpress SARS-CoV-2/FLU/RSV plus assay is intended as an aid in the diagnosis of influenza from Nasopharyngeal swab specimens and should not be used as a sole basis for treatment. Nasal washings and aspirates are unacceptable for Xpert Xpress SARS-CoV-2/FLU/RSV testing.  Fact Sheet for Patients: EntrepreneurPulse.com.au  Fact Sheet for Healthcare Providers: IncredibleEmployment.be  This test is not yet approved or cleared by the [place] FDA and has been authorized for detection and/or diagnosis of SARS-CoV-2 by FDA under an Emergency Use Authorization (EUA). This EUA will remain in effect (meaning this test can be used) for the duration of the COVID-19 declaration under Section 564(b)(1) of the Act, 21 U.S.C. section 360bbb-3(b)(1), unless the authorization is terminated or revoked.     Resp Syncytial Virus by PCR NEGATIVE NEGATIVE Final    Comment: (NOTE) Fact Sheet for Patients: EntrepreneurPulse.com.au  Fact Sheet for Healthcare Providers: IncredibleEmployment.be  This test is not yet approved or cleared by the [place] FDA and has been authorized for detection and/or diagnosis of SARS-CoV-2 by FDA under an Emergency Use Authorization (EUA). This EUA will remain in effect (meaning this test can be used) for the duration of the COVID-19 declaration under Section 564(b)(1) of the Act, 21 U.S.C. section 360bbb-3(b)(1), unless the authorization is terminated or revoked.  Performed at [facility], [address] [place], [place] [address]   Culture, blood (single)     Status: None (Preliminary result)   Collection Time: 07/18/22  5:16 AM   Specimen: BLOOD  Result Value Ref Range Status   Specimen Description BLOOD BLOOD LEFT FOREARM  Final   Special Requests   Final    BOTTLES DRAWN AEROBIC AND ANAEROBIC Blood Culture results may not be optimal due to an inadequate volume of blood received in culture bottles   Culture   Final    NO GROWTH 2 DAYS Performed at [facility], [address], [place], [place] [address]    Report Status PENDING  Incomplete  MRSA Next Gen by PCR, Nasal     Status: None   Collection Time: 07/18/22  8:15 AM   Specimen: Nasal Mucosa; Nasal Swab  Result Value  Ref Range Status   MRSA by PCR Next Gen NOT DETECTED NOT DETECTED Final    Comment: (NOTE) The GeneXpert MRSA Assay (FDA approved for NASAL specimens only), is one component of a comprehensive MRSA colonization surveillance program. It is not intended to diagnose MRSA infection nor to guide or monitor treatment for MRSA infections. Test performance is not FDA approved in patients less than 62 years old. Performed at [facility], [place], [place] [address]   Respiratory (~20 pathogens) panel by PCR     Status: Abnormal   Collection Time: 07/18/22  8:15 AM   Specimen: Nasopharyngeal Swab; Respiratory  Result Value Ref Range Status   Adenovirus NOT DETECTED NOT DETECTED Final   Coronavirus 229E NOT DETECTED NOT DETECTED Final    Comment: (NOTE) The Coronavirus on the Respiratory Panel, DOES NOT test for the novel  Coronavirus (2019 nCoV)    Coronavirus HKU1 NOT DETECTED NOT DETECTED Final   Coronavirus NL63 NOT DETECTED NOT DETECTED Final   Coronavirus OC43 NOT DETECTED NOT DETECTED Final   Metapneumovirus NOT DETECTED NOT DETECTED Final   Rhinovirus / Enterovirus NOT DETECTED NOT DETECTED Final   Influenza A NOT DETECTED NOT DETECTED Final   Influenza B NOT  DETECTED NOT DETECTED Final   Parainfluenza Virus 1 NOT DETECTED NOT DETECTED Final   Parainfluenza Virus 2 NOT DETECTED NOT DETECTED Final   Parainfluenza Virus 3 DETECTED (A) NOT DETECTED Final   Parainfluenza Virus 4 NOT DETECTED NOT DETECTED Final   Respiratory Syncytial Virus NOT DETECTED NOT DETECTED Final   Bordetella pertussis NOT DETECTED NOT DETECTED Final   Bordetella Parapertussis NOT DETECTED NOT DETECTED Final   Chlamydophila pneumoniae NOT DETECTED NOT DETECTED Final   Mycoplasma pneumoniae NOT DETECTED NOT DETECTED Final    Comment: Performed at [facility], [place]. [address] [place], [place] [address]  Expectorated Sputum Assessment w Gram Stain, Rflx to Resp Cult     Status: None   Collection Time: 07/18/22 11:15 AM   Specimen: Sputum  Result Value Ref Range Status   Specimen Description SPUTUM  Final   Special Requests NONE  Final   Sputum evaluation   Final    THIS SPECIMEN IS ACCEPTABLE FOR SPUTUM CULTURE Performed at [facility], [address], [place], [place] [address]    Report Status 07/18/2022 FINAL  Final  Culture, Respiratory w Gram Stain     Status: None (Preliminary result)   Collection Time: 07/18/22 11:15 AM   Specimen: SPU  Result Value Ref Range Status   Specimen Description   Final    SPUTUM Performed at [facility], [address], [place], [place] [address]    Special Requests   Final    NONE Reflexed from [id number] Performed at [facility], [address] [place], [place] [address]    Gram Stain   Final    FEW WBC PRESENT, PREDOMINANTLY PMN MODERATE GRAM POSITIVE COCCI IN PAIRS RARE YEAST    Culture   Final    CULTURE REINCUBATED FOR BETTER GROWTH Performed at [facility], [place] [address] [place],  [address]    Report Status PENDING  Incomplete     Labs: Basic Metabolic Panel: Recent Labs  Lab 07/18/22 0411 07/19/22 0522  NA 136 137  K 4.9 4.4  CL 99 102  CO2 29 26   GLUCOSE 104* 130*  BUN 18 18  CREATININE 0.69 0.56  CALCIUM 9.3 9.8  Liver Function Tests: Recent Labs  Lab 07/18/22 0411 07/19/22 0522  AST 25 20  ALT 17 16  ALKPHOS 69 61  BILITOT 0.3 0.5  PROT 7.6 7.4  ALBUMIN 3.7 3.3*   Recent Labs  Lab 07/18/22 0411  LIPASE 28   No results for input(s): "AMMONIA" in the last 168 hours. CBC: Recent Labs  Lab 07/18/22 0411 07/19/22 0522  WBC 7.1 13.3*  NEUTROABS 5.3  --   HGB 10.0* 9.8*  HCT 33.5* 31.8*  MCV 85.7 82.4  PLT PLATELET CLUMPS NOTED ON SMEAR, UNABLE TO ESTIMATE 194   Cardiac Enzymes: No results for input(s): "CKTOTAL", "CKMB", "CKMBINDEX", "TROPONINI" in the last 168 hours. BNP: BNP (last 3 results) Recent Labs    09/30/21 0439 02/26/22 0640 07/18/22 0411  BNP 91.8 82.9 37.5    ProBNP (last 3 results) No results for input(s): "PROBNP" in the last 8760 hours.  CBG: No results for input(s): "GLUCAP" in the last 168 hours.     Signed:  Desma Maxim MD.  [facility] 07/20/2022, 3:08 PM

## 2022-07-21 LAB — CULTURE, RESPIRATORY W GRAM STAIN: Culture: NORMAL

## 2022-07-23 LAB — CULTURE, BLOOD (SINGLE)
Culture: NO GROWTH
Culture: NO GROWTH
Special Requests: ADEQUATE

## 2022-08-09 DIAGNOSIS — J449 Chronic obstructive pulmonary disease, unspecified: Secondary | ICD-10-CM | POA: Diagnosis not present

## 2022-08-09 DIAGNOSIS — J9621 Acute and chronic respiratory failure with hypoxia: Secondary | ICD-10-CM | POA: Diagnosis not present

## 2022-08-18 DIAGNOSIS — K449 Diaphragmatic hernia without obstruction or gangrene: Secondary | ICD-10-CM | POA: Diagnosis not present

## 2022-09-08 DIAGNOSIS — J9621 Acute and chronic respiratory failure with hypoxia: Secondary | ICD-10-CM | POA: Diagnosis not present

## 2022-09-08 DIAGNOSIS — J449 Chronic obstructive pulmonary disease, unspecified: Secondary | ICD-10-CM | POA: Diagnosis not present

## 2022-10-09 DIAGNOSIS — J9621 Acute and chronic respiratory failure with hypoxia: Secondary | ICD-10-CM | POA: Diagnosis not present

## 2022-10-09 DIAGNOSIS — J449 Chronic obstructive pulmonary disease, unspecified: Secondary | ICD-10-CM | POA: Diagnosis not present

## 2022-10-23 DIAGNOSIS — Z791 Long term (current) use of non-steroidal anti-inflammatories (NSAID): Secondary | ICD-10-CM | POA: Diagnosis not present

## 2022-10-23 DIAGNOSIS — F411 Generalized anxiety disorder: Secondary | ICD-10-CM | POA: Diagnosis not present

## 2022-10-23 DIAGNOSIS — M1990 Unspecified osteoarthritis, unspecified site: Secondary | ICD-10-CM | POA: Diagnosis not present

## 2022-10-23 DIAGNOSIS — Z811 Family history of alcohol abuse and dependence: Secondary | ICD-10-CM | POA: Diagnosis not present

## 2022-10-23 DIAGNOSIS — K5900 Constipation, unspecified: Secondary | ICD-10-CM | POA: Diagnosis not present

## 2022-10-23 DIAGNOSIS — Z818 Family history of other mental and behavioral disorders: Secondary | ICD-10-CM | POA: Diagnosis not present

## 2022-10-23 DIAGNOSIS — J9610 Chronic respiratory failure, unspecified whether with hypoxia or hypercapnia: Secondary | ICD-10-CM | POA: Diagnosis not present

## 2022-10-23 DIAGNOSIS — J439 Emphysema, unspecified: Secondary | ICD-10-CM | POA: Diagnosis not present

## 2022-10-23 DIAGNOSIS — F17210 Nicotine dependence, cigarettes, uncomplicated: Secondary | ICD-10-CM | POA: Diagnosis not present

## 2022-10-23 DIAGNOSIS — G4733 Obstructive sleep apnea (adult) (pediatric): Secondary | ICD-10-CM | POA: Diagnosis not present

## 2022-10-23 DIAGNOSIS — Z9981 Dependence on supplemental oxygen: Secondary | ICD-10-CM | POA: Diagnosis not present

## 2022-10-23 DIAGNOSIS — K219 Gastro-esophageal reflux disease without esophagitis: Secondary | ICD-10-CM | POA: Diagnosis not present

## 2022-11-08 DIAGNOSIS — J449 Chronic obstructive pulmonary disease, unspecified: Secondary | ICD-10-CM | POA: Diagnosis not present

## 2022-11-08 DIAGNOSIS — J9621 Acute and chronic respiratory failure with hypoxia: Secondary | ICD-10-CM | POA: Diagnosis not present

## 2022-12-06 DIAGNOSIS — K439 Ventral hernia without obstruction or gangrene: Secondary | ICD-10-CM | POA: Insufficient documentation

## 2022-12-09 DIAGNOSIS — J9621 Acute and chronic respiratory failure with hypoxia: Secondary | ICD-10-CM | POA: Diagnosis not present

## 2022-12-09 DIAGNOSIS — J449 Chronic obstructive pulmonary disease, unspecified: Secondary | ICD-10-CM | POA: Diagnosis not present

## 2023-01-09 DIAGNOSIS — J449 Chronic obstructive pulmonary disease, unspecified: Secondary | ICD-10-CM | POA: Diagnosis not present

## 2023-01-09 DIAGNOSIS — J9621 Acute and chronic respiratory failure with hypoxia: Secondary | ICD-10-CM | POA: Diagnosis not present

## 2023-02-08 DIAGNOSIS — J449 Chronic obstructive pulmonary disease, unspecified: Secondary | ICD-10-CM | POA: Diagnosis not present

## 2023-02-08 DIAGNOSIS — J9621 Acute and chronic respiratory failure with hypoxia: Secondary | ICD-10-CM | POA: Diagnosis not present

## 2023-02-10 DIAGNOSIS — Z01818 Encounter for other preprocedural examination: Secondary | ICD-10-CM | POA: Diagnosis not present

## 2023-02-10 DIAGNOSIS — J4540 Moderate persistent asthma, uncomplicated: Secondary | ICD-10-CM | POA: Diagnosis not present

## 2023-02-10 DIAGNOSIS — J449 Chronic obstructive pulmonary disease, unspecified: Secondary | ICD-10-CM | POA: Diagnosis not present

## 2023-03-01 DIAGNOSIS — K449 Diaphragmatic hernia without obstruction or gangrene: Secondary | ICD-10-CM | POA: Diagnosis not present

## 2023-03-01 DIAGNOSIS — K219 Gastro-esophageal reflux disease without esophagitis: Secondary | ICD-10-CM | POA: Diagnosis not present

## 2023-03-11 DIAGNOSIS — J449 Chronic obstructive pulmonary disease, unspecified: Secondary | ICD-10-CM | POA: Diagnosis not present

## 2023-03-11 DIAGNOSIS — J9621 Acute and chronic respiratory failure with hypoxia: Secondary | ICD-10-CM | POA: Diagnosis not present

## 2023-03-14 DIAGNOSIS — Z9911 Dependence on respirator [ventilator] status: Secondary | ICD-10-CM | POA: Diagnosis not present

## 2023-03-14 DIAGNOSIS — C49A2 Gastrointestinal stromal tumor of stomach: Secondary | ICD-10-CM | POA: Diagnosis not present

## 2023-03-14 DIAGNOSIS — K469 Unspecified abdominal hernia without obstruction or gangrene: Secondary | ICD-10-CM | POA: Diagnosis not present

## 2023-03-14 DIAGNOSIS — J90 Pleural effusion, not elsewhere classified: Secondary | ICD-10-CM | POA: Diagnosis not present

## 2023-03-14 DIAGNOSIS — Z4682 Encounter for fitting and adjustment of non-vascular catheter: Secondary | ICD-10-CM | POA: Diagnosis not present

## 2023-03-15 DIAGNOSIS — Z4682 Encounter for fitting and adjustment of non-vascular catheter: Secondary | ICD-10-CM | POA: Diagnosis not present

## 2023-03-15 DIAGNOSIS — Z9911 Dependence on respirator [ventilator] status: Secondary | ICD-10-CM | POA: Diagnosis not present

## 2023-03-16 DIAGNOSIS — Z9889 Other specified postprocedural states: Secondary | ICD-10-CM | POA: Diagnosis not present

## 2023-03-16 DIAGNOSIS — R918 Other nonspecific abnormal finding of lung field: Secondary | ICD-10-CM | POA: Diagnosis not present

## 2023-03-17 DIAGNOSIS — J9811 Atelectasis: Secondary | ICD-10-CM | POA: Diagnosis not present

## 2023-03-17 DIAGNOSIS — J90 Pleural effusion, not elsewhere classified: Secondary | ICD-10-CM | POA: Diagnosis not present

## 2023-03-17 DIAGNOSIS — R0902 Hypoxemia: Secondary | ICD-10-CM | POA: Diagnosis not present

## 2023-03-18 DIAGNOSIS — J810 Acute pulmonary edema: Secondary | ICD-10-CM | POA: Diagnosis not present

## 2023-03-18 DIAGNOSIS — J90 Pleural effusion, not elsewhere classified: Secondary | ICD-10-CM | POA: Diagnosis not present

## 2023-03-18 DIAGNOSIS — R0602 Shortness of breath: Secondary | ICD-10-CM | POA: Diagnosis not present

## 2023-03-18 DIAGNOSIS — Z79899 Other long term (current) drug therapy: Secondary | ICD-10-CM | POA: Diagnosis not present

## 2023-03-18 DIAGNOSIS — J449 Chronic obstructive pulmonary disease, unspecified: Secondary | ICD-10-CM | POA: Diagnosis not present

## 2023-03-18 DIAGNOSIS — Z9981 Dependence on supplemental oxygen: Secondary | ICD-10-CM | POA: Diagnosis not present

## 2023-03-18 DIAGNOSIS — Z8719 Personal history of other diseases of the digestive system: Secondary | ICD-10-CM | POA: Insufficient documentation

## 2023-03-18 DIAGNOSIS — K439 Ventral hernia without obstruction or gangrene: Secondary | ICD-10-CM | POA: Diagnosis not present

## 2023-03-18 DIAGNOSIS — I2510 Atherosclerotic heart disease of native coronary artery without angina pectoris: Secondary | ICD-10-CM | POA: Diagnosis not present

## 2023-03-18 DIAGNOSIS — J9811 Atelectasis: Secondary | ICD-10-CM | POA: Diagnosis not present

## 2023-03-20 DIAGNOSIS — J811 Chronic pulmonary edema: Secondary | ICD-10-CM | POA: Diagnosis not present

## 2023-03-20 DIAGNOSIS — J90 Pleural effusion, not elsewhere classified: Secondary | ICD-10-CM | POA: Diagnosis not present

## 2023-03-20 DIAGNOSIS — Z452 Encounter for adjustment and management of vascular access device: Secondary | ICD-10-CM | POA: Diagnosis not present

## 2023-03-20 DIAGNOSIS — Z4682 Encounter for fitting and adjustment of non-vascular catheter: Secondary | ICD-10-CM | POA: Diagnosis not present

## 2023-03-21 DIAGNOSIS — R109 Unspecified abdominal pain: Secondary | ICD-10-CM | POA: Diagnosis not present

## 2023-03-21 DIAGNOSIS — G8918 Other acute postprocedural pain: Secondary | ICD-10-CM | POA: Diagnosis not present

## 2023-03-23 DIAGNOSIS — Z9889 Other specified postprocedural states: Secondary | ICD-10-CM | POA: Diagnosis not present

## 2023-03-23 DIAGNOSIS — K439 Ventral hernia without obstruction or gangrene: Secondary | ICD-10-CM | POA: Diagnosis not present

## 2023-03-23 DIAGNOSIS — Z8719 Personal history of other diseases of the digestive system: Secondary | ICD-10-CM | POA: Diagnosis not present

## 2023-04-10 DIAGNOSIS — J9621 Acute and chronic respiratory failure with hypoxia: Secondary | ICD-10-CM | POA: Diagnosis not present

## 2023-04-10 DIAGNOSIS — J449 Chronic obstructive pulmonary disease, unspecified: Secondary | ICD-10-CM | POA: Diagnosis not present

## 2023-05-08 DIAGNOSIS — J449 Chronic obstructive pulmonary disease, unspecified: Secondary | ICD-10-CM | POA: Diagnosis not present

## 2023-05-11 DIAGNOSIS — J449 Chronic obstructive pulmonary disease, unspecified: Secondary | ICD-10-CM | POA: Diagnosis not present

## 2023-05-11 DIAGNOSIS — J9621 Acute and chronic respiratory failure with hypoxia: Secondary | ICD-10-CM | POA: Diagnosis not present

## 2023-06-02 IMAGING — CT CT ANGIO CHEST
2 of 6 series · 17 of 46 positions shown · IV contrast (APPLIED)
Comparison: Chest CT dated July 19, 2021

CLINICAL DATA: Pulmonary embolus suspected

EXAM:
CT ANGIOGRAPHY CHEST WITH CONTRAST
TECHNIQUE: Multidetector CT imaging of the chest was performed using the
standard protocol during bolus administration of intravenous
contrast. Multiplanar CT image reconstructions and MIPs were
obtained to evaluate the vascular anatomy.

[Series 6: thins · axial · 0.69mm/px · z∈[-818,-562]mm · 14 of 401 slices shown]
[im 18/401  lung]
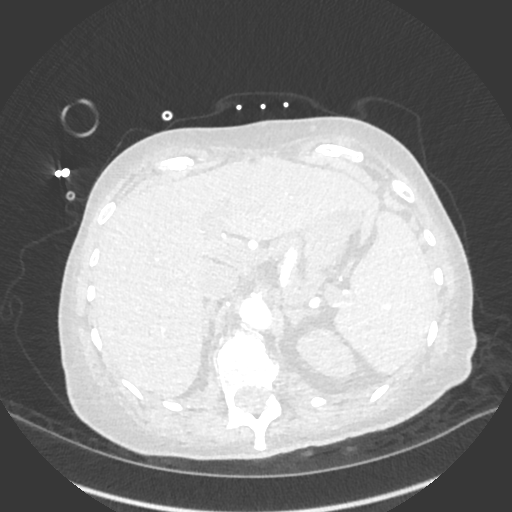
[im 53/401  soft-tissue]
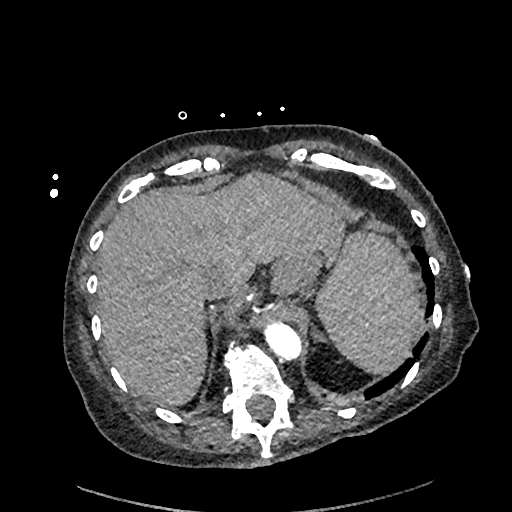
[im 70/401  lung]
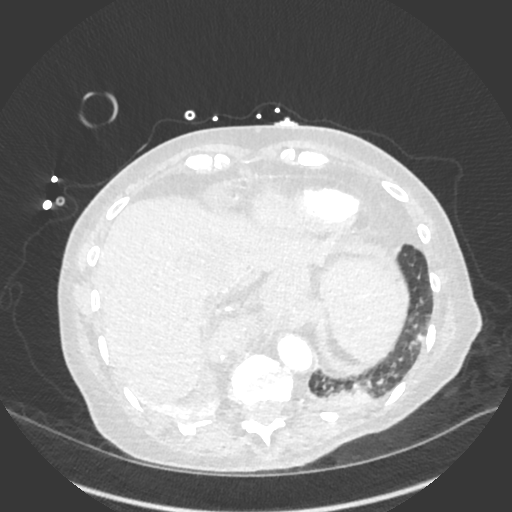
[im 105/401  soft-tissue]
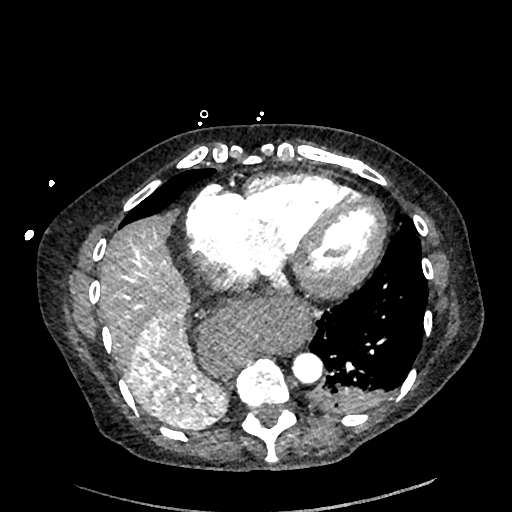
[im 140/401  lung]
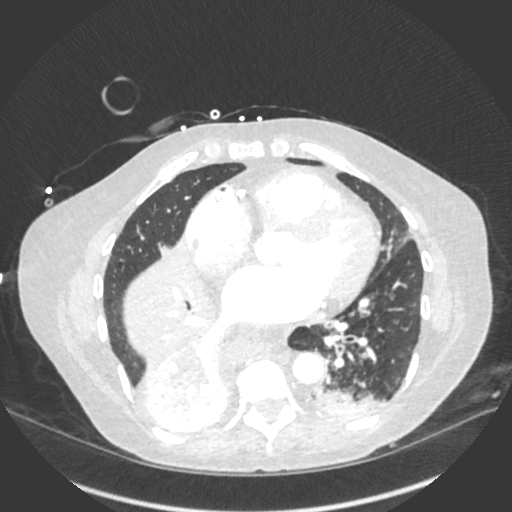
[im 157/401  soft-tissue]
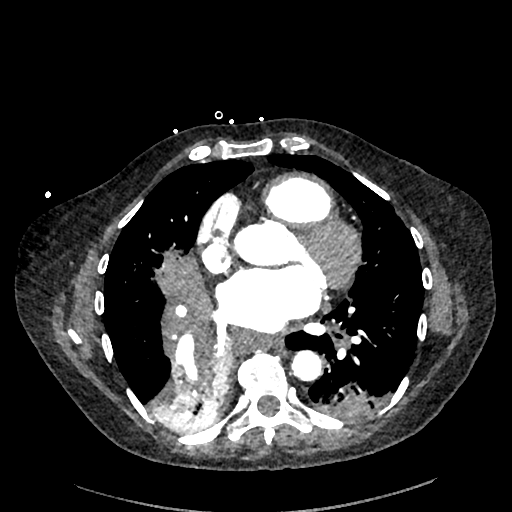
[im 192/401  lung]
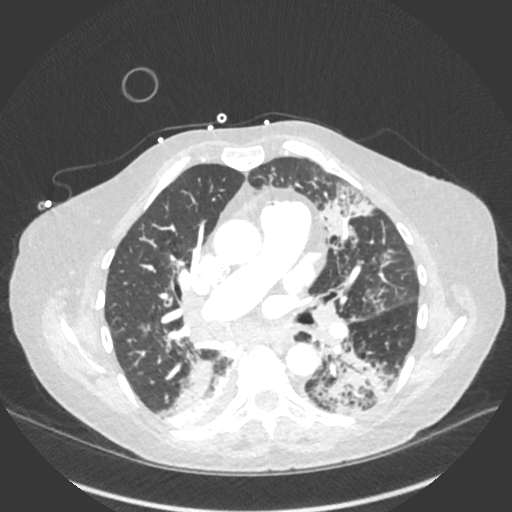
[im 209/401  soft-tissue]
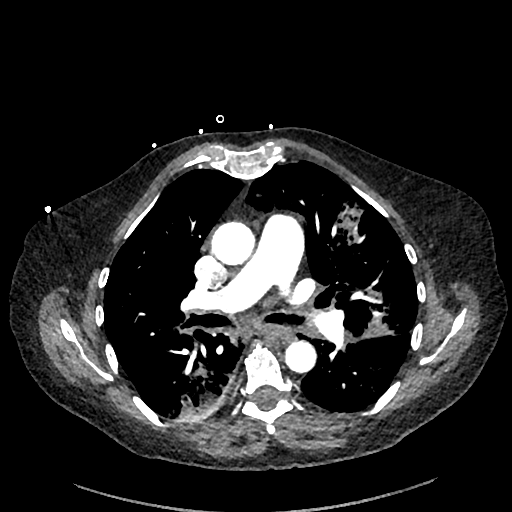
[im 244/401  lung]
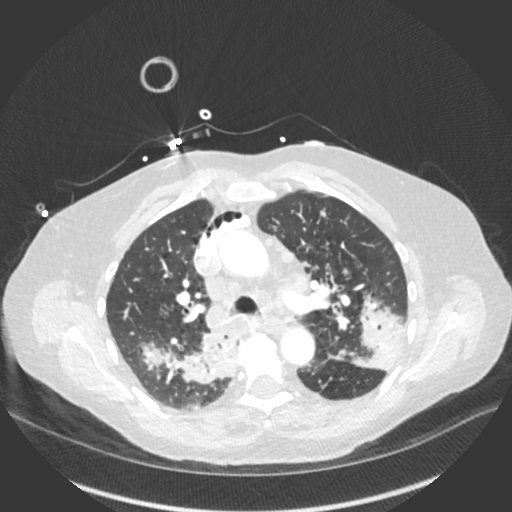
[im 261/401  soft-tissue]
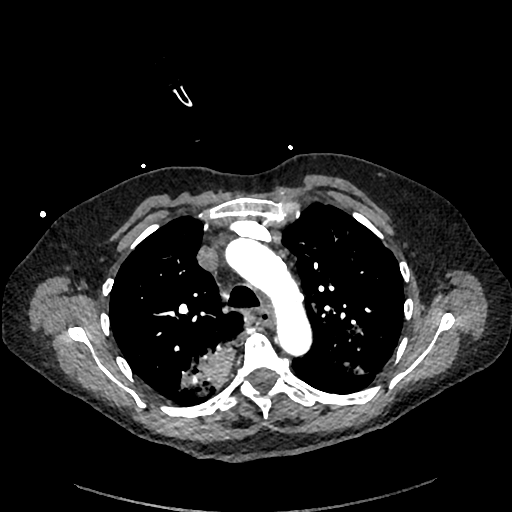
[im 296/401  lung]
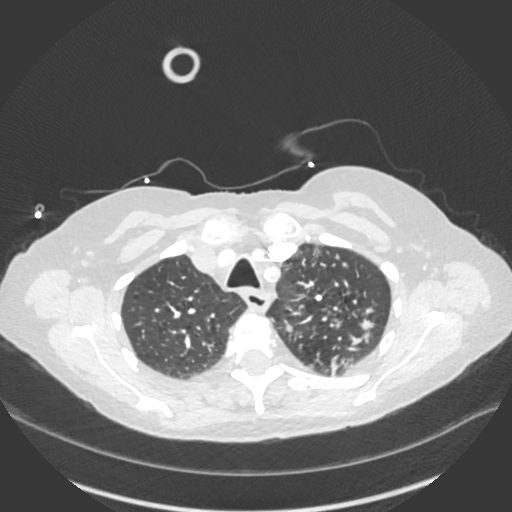
[im 331/401  soft-tissue]
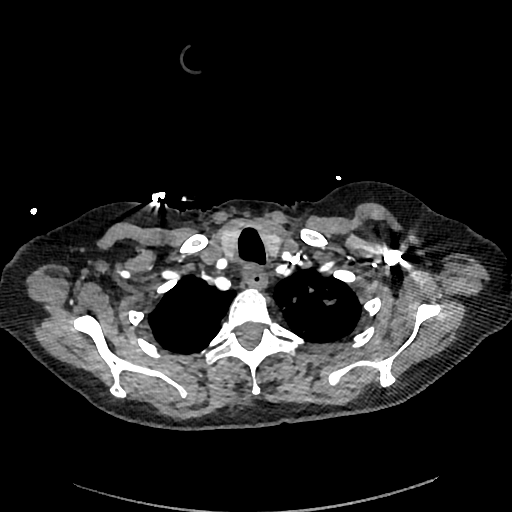
[im 348/401  lung]
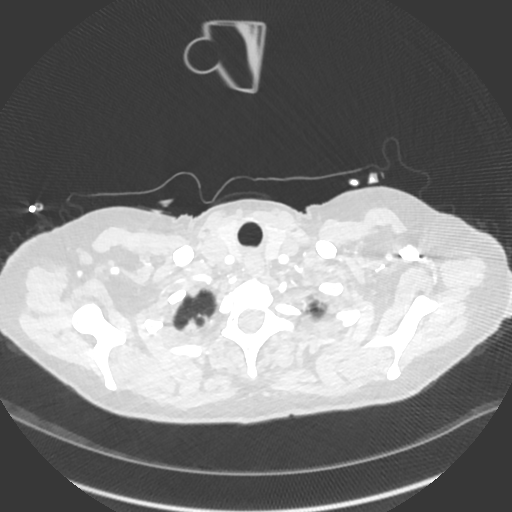
[im 383/401  soft-tissue]
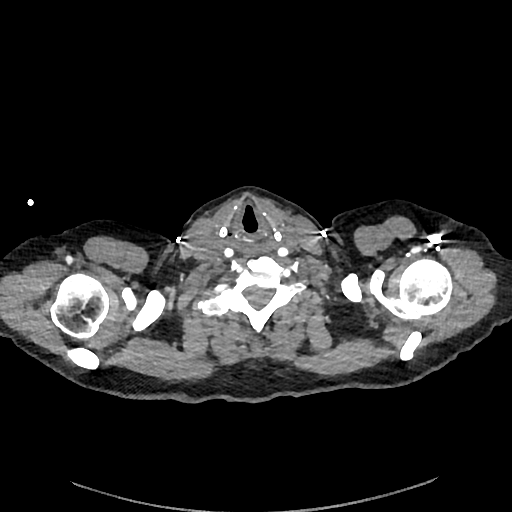

[Series 7: cor · coronal · 0.54mm/px · 3 of 124 slices shown]
[im 31/124  soft-tissue]
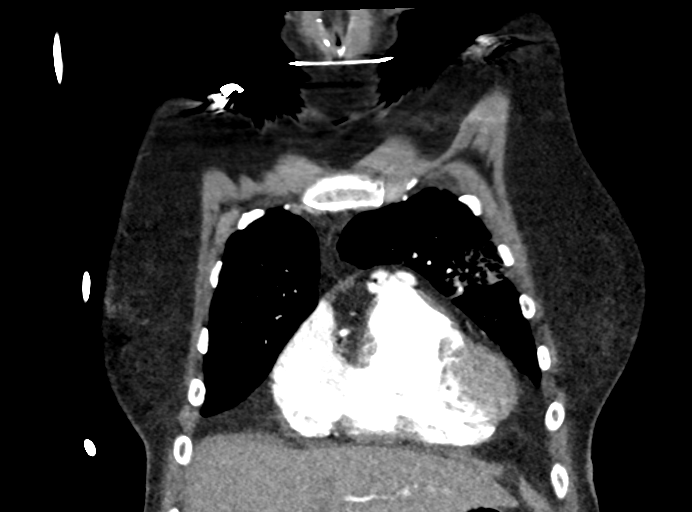
[im 62/124  soft-tissue]
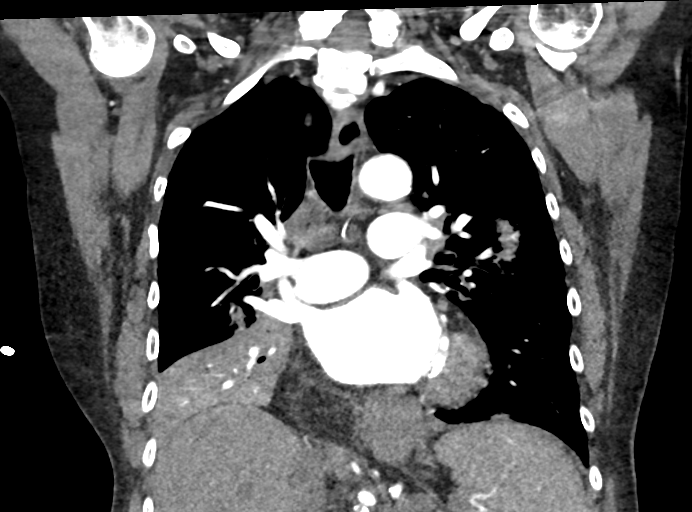
[im 93/124  soft-tissue]
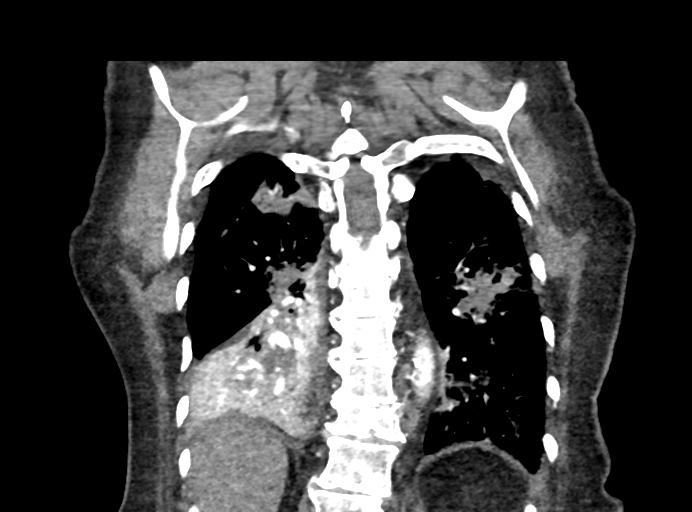

[17 of 46 positions shown; findings below may reference images not displayed]

RADIATION DOSE REDUCTION: This exam was performed according to the
departmental dose-optimization program which includes automated
exposure control, adjustment of the mA and/or kV according to
patient size and/or use of iterative reconstruction technique.

CONTRAST:  40mL OMNIPAQUE IOHEXOL 350 MG/ML SOLN
FINDINGS: Cardiovascular: Adequate contrast opacification of the pulmonary
arteries. No evidence of pulmonary embolus. Heart is upper limits of
normal in size. No pericardial effusion. Left main and three-vessel
coronary artery calcifications. Atherosclerotic disease of the
thoracic aorta.

Mediastinum/Nodes: Large hiatal hernia. Mediastinal and bilateral
hilar adenopathy. Mediastinal adenopathy is similar when compared
with prior exam. Reference right lower paratracheal lymph node
measuring 1.7 cm in short axis on series 6, image 176. New bilateral
hilar lymphadenopathy.

Lungs/Pleura: Centrilobular emphysema. Complete occlusion of the
right lower lobe bronchus with near complete collapse of the right
lower lobe. New patchy bilateral consolidations. Bilateral small
solid pulmonary nodules are similar to recent prior exam.

Upper Abdomen: No acute abnormality.

Musculoskeletal: Unchanged T1 compression fracture and sternal
fracture. No acute osseous abnormality.

Review of the MIP images confirms the above findings.
IMPRESSION: 1. No evidence of pulmonary embolus.
2. New bilateral patchy consolidations, concerning for multifocal
pneumonia.
3. Near complete collapse of the right lower lobe and occlusion of
the right lower lobe bronchus, likely due to aspiration.
4. Increased size of hilar lymph nodes, likely reactive.
5. Bilateral small solid pulmonary nodules are similar to recent
prior exam and likely due to background chronic atypical infection.
6. Aortic Atherosclerosis ([id number]) and Emphysema ([id number]).

## 2023-06-02 IMAGING — DX DG CHEST 1V PORT
1 series · 2 of 2 positions shown · non-contrast
Comparison: July 19, 2021.

CLINICAL DATA: Questionable sepsis - evaluate for abnormality

EXAM:
PORTABLE CHEST 1 VIEW

[Series 1: chest ap · 0.14mm/px · 2 of 2 slices shown]
[im 1/2]
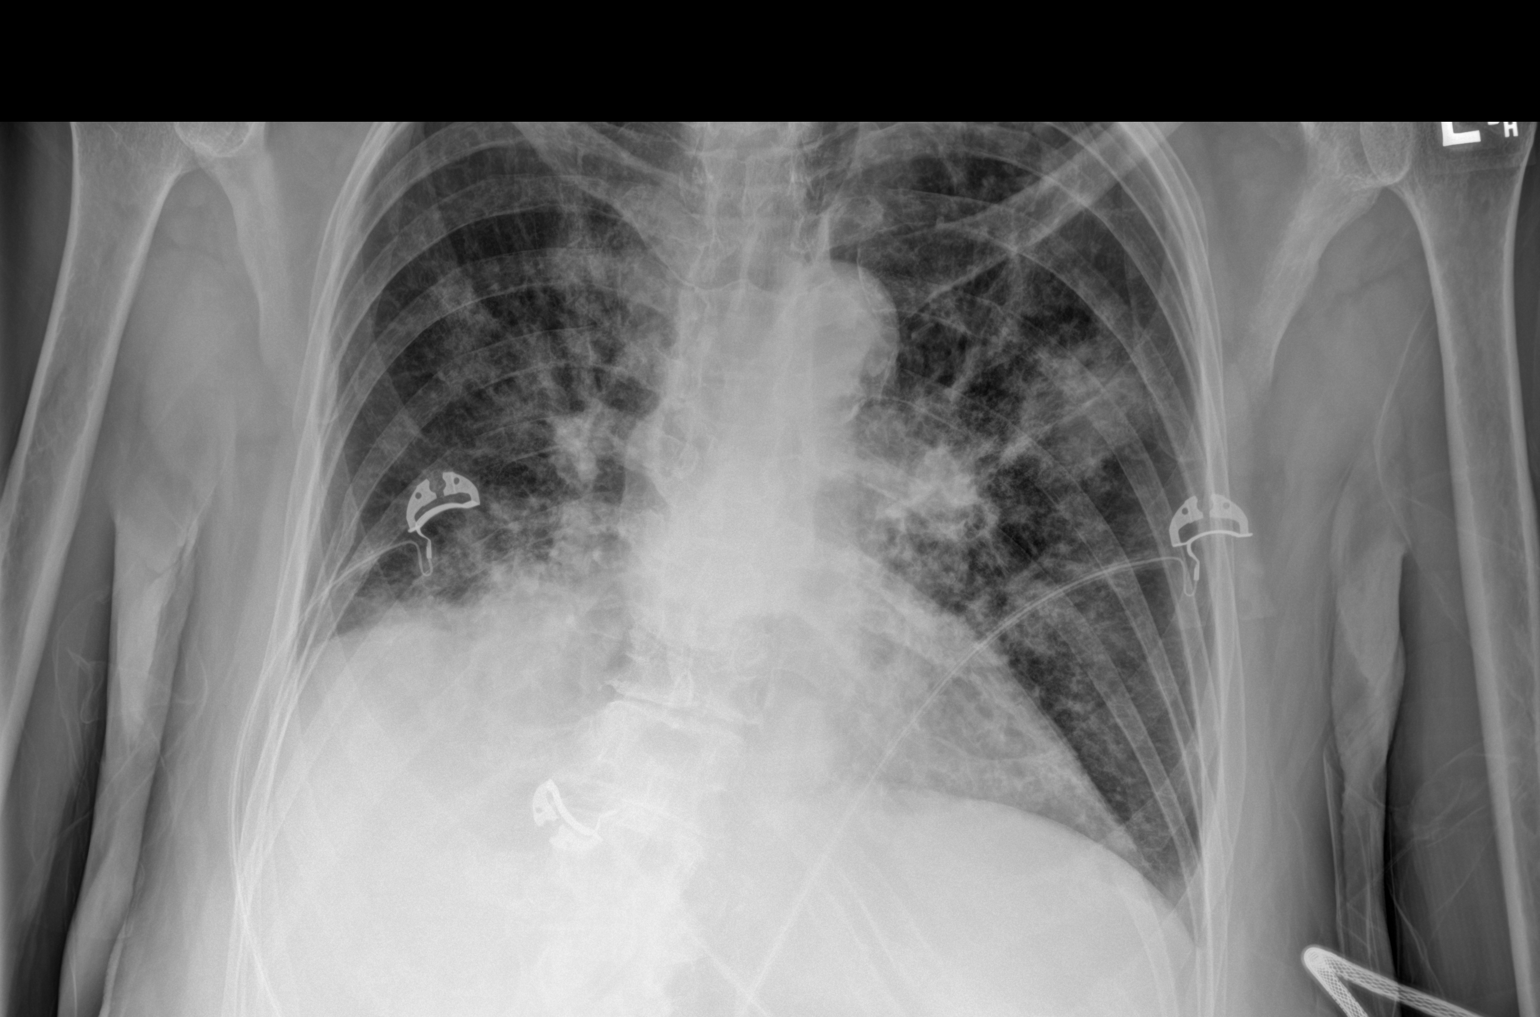
[im 2/2]
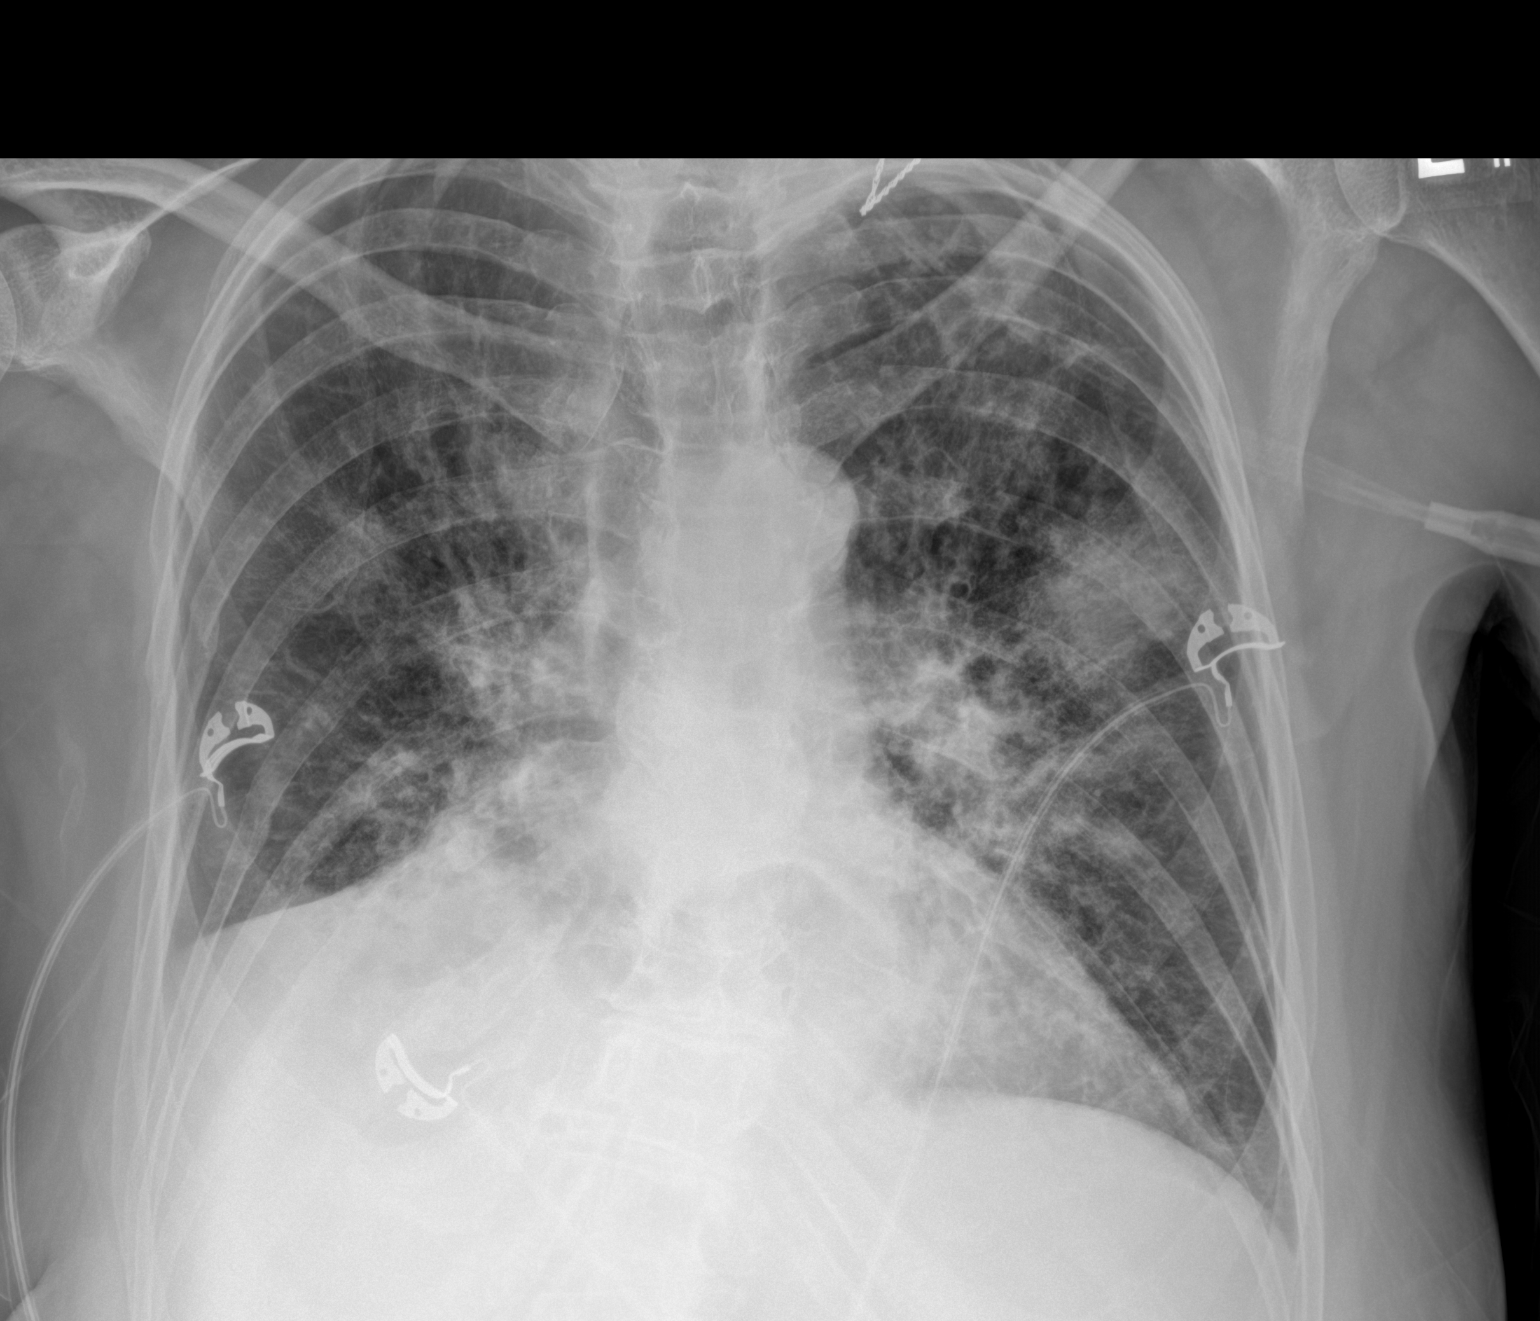

[2 of 2 positions shown; findings below may reference images not displayed]

FINDINGS: New consolidation in the left midlung. Additional reticulonodular
opacities are similar. New probable right lower lobe collapse. No
definite pleural effusions or pneumothorax. Cardiomediastinal
silhouette is similar. Polyarticular degenerative change.
IMPRESSION: 1. New consolidation in the left midlung , concerning for pneumonia.
Followup PA and lateral chest X-ray is recommended in 3-4 weeks
following trial of antibiotic therapy to ensure resolution and
exclude underlying malignancy.
2. New probable right lower lobe collapse.
3. Additional reticulonodular opacities are similar, compatible with
chronic indolent atypical JIM nodes better characterized on prior CT
chest.

## 2023-06-07 IMAGING — DX DG CHEST 1V PORT
1 series · 1 of 1 positions shown · non-contrast
Comparison: September 25, 2021.

CLINICAL DATA: Pneumonia.

EXAM:
PORTABLE CHEST 1 VIEW

[chest ap]
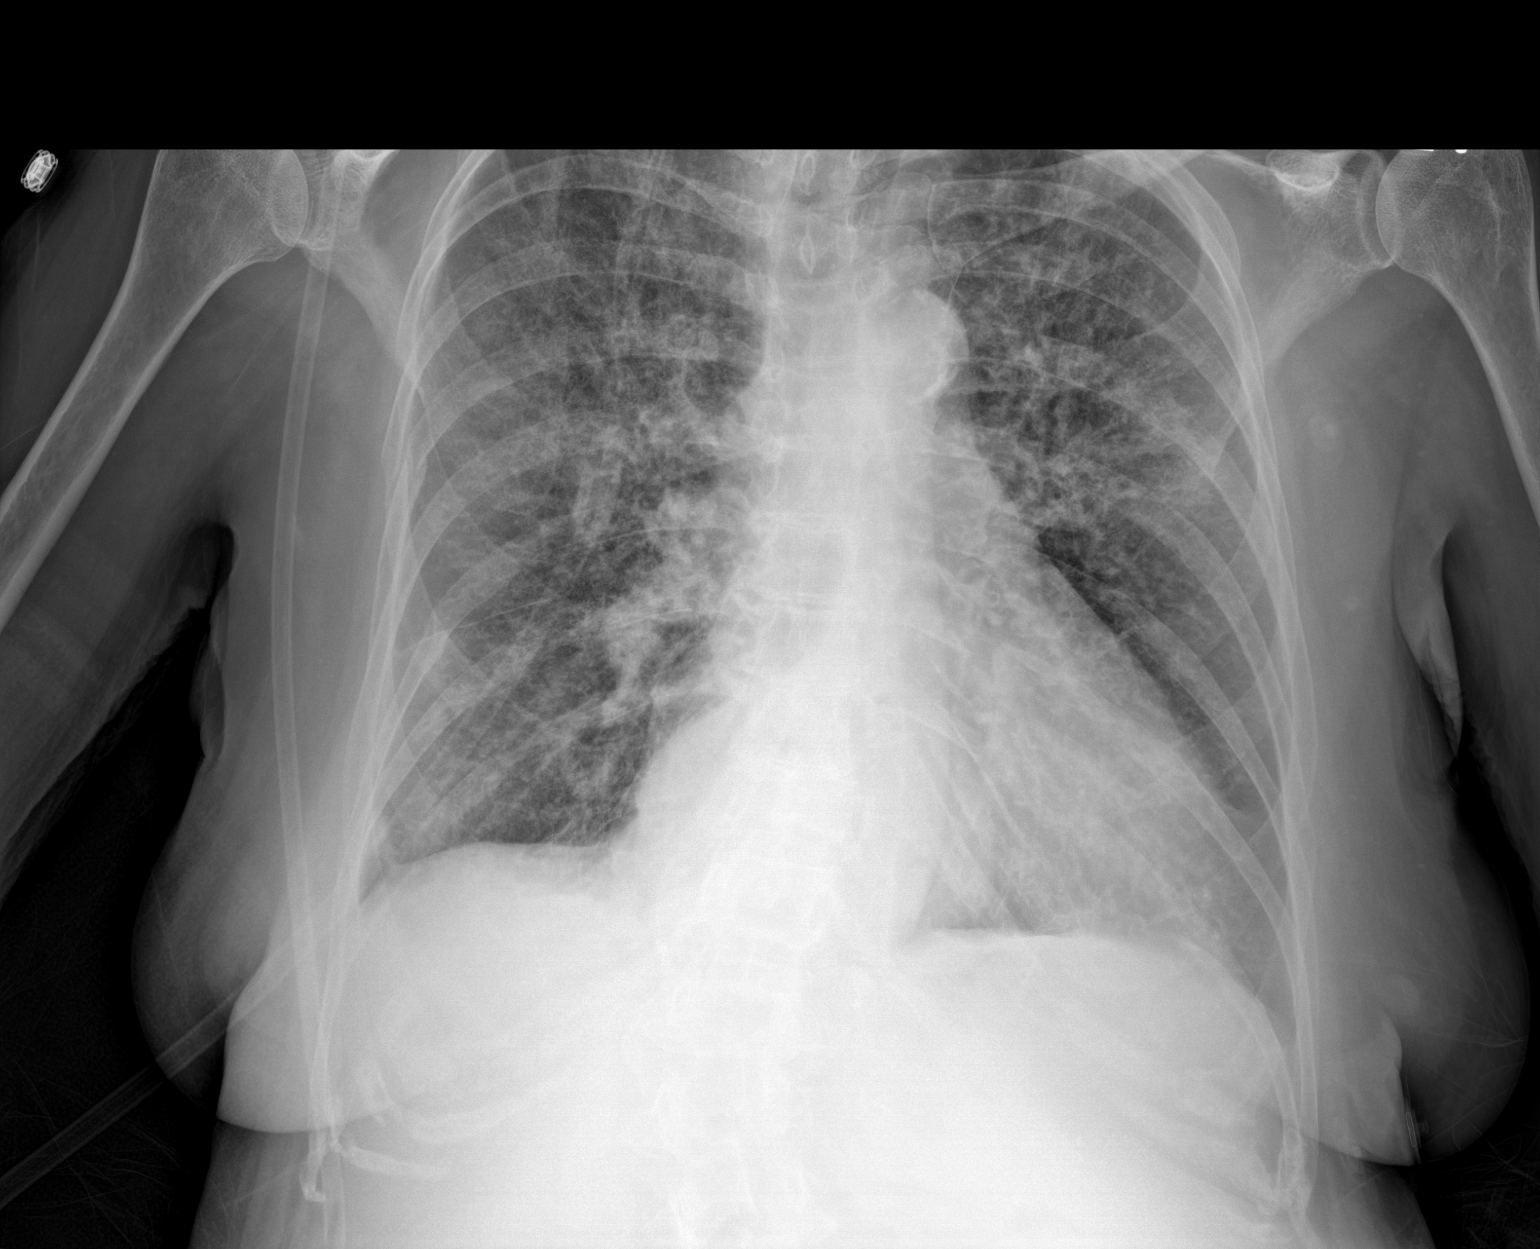

[1 of 1 positions shown; findings below may reference images not displayed]

FINDINGS: Mildly improved aeration of the lungs with persistent interstitial
and airspace opacities bilaterally. No visible pleural effusion or
pneumothorax. Similar mild enlargement the cardiac silhouette.
Probable hiatal hernia.
IMPRESSION: Mildly improved aeration of the lungs with persistent interstitial
and airspace opacities bilaterally.

## 2023-06-09 ENCOUNTER — Inpatient Hospital Stay
Admission: EM | Admit: 2023-06-09 | Discharge: 2023-06-12 | DRG: 865 | Disposition: A | Discharge status: Home / Self Care | Payer: 59 | Attending: Internal Medicine | Admitting: Internal Medicine

## 2023-06-09 ENCOUNTER — Emergency Department: Payer: 59

## 2023-06-09 DIAGNOSIS — J111 Influenza due to unidentified influenza virus with other respiratory manifestations: Secondary | ICD-10-CM

## 2023-06-09 DIAGNOSIS — D696 Thrombocytopenia, unspecified: Secondary | ICD-10-CM | POA: Diagnosis present

## 2023-06-09 DIAGNOSIS — Z791 Long term (current) use of non-steroidal anti-inflammatories (NSAID): Secondary | ICD-10-CM | POA: Diagnosis not present

## 2023-06-09 DIAGNOSIS — F32A Depression, unspecified: Secondary | ICD-10-CM | POA: Diagnosis present

## 2023-06-09 DIAGNOSIS — J441 Chronic obstructive pulmonary disease with (acute) exacerbation: Principal | ICD-10-CM | POA: Diagnosis present

## 2023-06-09 DIAGNOSIS — Z79899 Other long term (current) drug therapy: Secondary | ICD-10-CM | POA: Diagnosis not present

## 2023-06-09 DIAGNOSIS — J1089 Influenza due to other identified influenza virus with other manifestations: Secondary | ICD-10-CM | POA: Diagnosis present

## 2023-06-09 DIAGNOSIS — K219 Gastro-esophageal reflux disease without esophagitis: Secondary | ICD-10-CM | POA: Diagnosis present

## 2023-06-09 DIAGNOSIS — Z1152 Encounter for screening for COVID-19: Secondary | ICD-10-CM | POA: Diagnosis not present

## 2023-06-09 DIAGNOSIS — Z91018 Allergy to other foods: Secondary | ICD-10-CM | POA: Diagnosis not present

## 2023-06-09 DIAGNOSIS — J9601 Acute respiratory failure with hypoxia: Secondary | ICD-10-CM | POA: Diagnosis present

## 2023-06-09 DIAGNOSIS — F419 Anxiety disorder, unspecified: Secondary | ICD-10-CM | POA: Diagnosis present

## 2023-06-09 DIAGNOSIS — Z811 Family history of alcohol abuse and dependence: Secondary | ICD-10-CM

## 2023-06-09 DIAGNOSIS — F17210 Nicotine dependence, cigarettes, uncomplicated: Secondary | ICD-10-CM | POA: Diagnosis present

## 2023-06-09 DIAGNOSIS — Z716 Tobacco abuse counseling: Secondary | ICD-10-CM

## 2023-06-09 DIAGNOSIS — R1912 Hyperactive bowel sounds: Secondary | ICD-10-CM | POA: Diagnosis present

## 2023-06-09 LAB — BLOOD GAS, VENOUS
Acid-Base Excess: 7.5 mmol/L — ABNORMAL HIGH (ref 0.0–2.0)
Bicarbonate: 32.7 mmol/L — ABNORMAL HIGH (ref 20.0–28.0)
O2 Saturation: 94.6 %
Patient temperature: 37
pCO2, Ven: 47 mm[Hg] (ref 44–60)
pH, Ven: 7.45 — ABNORMAL HIGH (ref 7.25–7.43)
pO2, Ven: 65 mm[Hg] — ABNORMAL HIGH (ref 32–45)

## 2023-06-09 LAB — RESP PANEL BY RT-PCR (RSV, FLU A&B, COVID)  RVPGX2
Influenza A by PCR: POSITIVE — AB
Influenza B by PCR: NEGATIVE
Resp Syncytial Virus by PCR: NEGATIVE
SARS Coronavirus 2 by RT PCR: NEGATIVE

## 2023-06-09 LAB — COMPREHENSIVE METABOLIC PANEL
ALT: 11 U/L (ref 0–44)
AST: 20 U/L (ref 15–41)
Albumin: 3.7 g/dL (ref 3.5–5.0)
Alkaline Phosphatase: 56 U/L (ref 38–126)
Anion gap: 11 (ref 5–15)
BUN: 12 mg/dL (ref 8–23)
CO2: 28 mmol/L (ref 22–32)
Calcium: 9.7 mg/dL (ref 8.9–10.3)
Chloride: 96 mmol/L — ABNORMAL LOW (ref 98–111)
Creatinine, Ser: 0.49 mg/dL (ref 0.44–1.00)
GFR, Estimated: >60 mL/min (ref >60)
Glucose, Bld: 95 mg/dL (ref 70–99)
Potassium: 3.7 mmol/L (ref 3.5–5.1)
Sodium: 135 mmol/L (ref 135–145)
Total Bilirubin: <0.2 mg/dL (ref 0.0–1.2)
Total Protein: 7.3 g/dL (ref 6.5–8.1)

## 2023-06-09 LAB — D-DIMER, QUANTITATIVE: D-Dimer, Quant: 0.7 ug{FEU}/mL — ABNORMAL HIGH (ref 0.00–0.50)

## 2023-06-09 LAB — TROPONIN I (HIGH SENSITIVITY): Troponin I (High Sensitivity): 7 ng/L (ref <18)

## 2023-06-09 LAB — CBC WITH DIFFERENTIAL/PLATELET
Abs Immature Granulocytes: 0.01 10*3/uL (ref 0.00–0.07)
Basophils Absolute: 0 10*3/uL (ref 0.0–0.1)
Basophils Relative: 1 %
Eosinophils Absolute: 0 10*3/uL (ref 0.0–0.5)
Eosinophils Relative: 0 %
HCT: 38.8 % (ref 36.0–46.0)
Hemoglobin: 12.2 g/dL (ref 12.0–15.0)
Immature Granulocytes: 0 %
Lymphocytes Relative: 21 %
Lymphs Abs: 0.9 10*3/uL (ref 0.7–4.0)
MCH: 26.1 pg (ref 26.0–34.0)
MCHC: 31.4 g/dL (ref 30.0–36.0)
MCV: 83.1 fL (ref 80.0–100.0)
Monocytes Absolute: 0.4 10*3/uL (ref 0.1–1.0)
Monocytes Relative: 10 %
Neutro Abs: 2.9 10*3/uL (ref 1.7–7.7)
Neutrophils Relative %: 68 %
Platelets: 116 10*3/uL — ABNORMAL LOW (ref 150–400)
RBC: 4.67 MIL/uL (ref 3.87–5.11)
RDW: 14 % (ref 11.5–15.5)
WBC: 4.3 10*3/uL (ref 4.0–10.5)
nRBC: 0 % (ref 0.0–0.2)

## 2023-06-09 LAB — BRAIN NATRIURETIC PEPTIDE: B Natriuretic Peptide: 84.1 pg/mL (ref 0.0–100.0)

## 2023-06-09 MED ORDER — ALPRAZOLAM 0.5 MG PO TABS
0.5000 mg | ORAL_TABLET | Freq: Two times a day (BID) | ORAL | Status: DC | PRN
  Administered 2023-06-10 – 2023-06-11 (×2): 0.5 mg via ORAL
  Filled 2023-06-09 (×2): qty 1

## 2023-06-09 MED ORDER — IOHEXOL 350 MG/ML SOLN
75.0000 mL | Freq: Once | INTRAVENOUS | Status: AC | PRN
  Administered 2023-06-09: 75 mL via INTRAVENOUS

## 2023-06-09 MED ORDER — NICOTINE 14 MG/24HR TD PT24
14.0000 mg | MEDICATED_PATCH | Freq: Every day | TRANSDERMAL | Status: DC
  Administered 2023-06-09 – 2023-06-11 (×3): 14 mg via TRANSDERMAL
  Filled 2023-06-09 (×4): qty 1

## 2023-06-09 MED ORDER — DOCUSATE SODIUM 100 MG PO CAPS: 200.0000 mg | ORAL_CAPSULE | Freq: Two times a day (BID) | ORAL | Status: DC | PRN

## 2023-06-09 MED ORDER — ACETAMINOPHEN 650 MG RE SUPP: 650.0000 mg | Freq: Four times a day (QID) | RECTAL | Status: DC | PRN

## 2023-06-09 MED ORDER — ONDANSETRON HCL 4 MG/2ML IJ SOLN: 4.0000 mg | Freq: Four times a day (QID) | INTRAMUSCULAR | Status: DC | PRN

## 2023-06-09 MED ORDER — OSELTAMIVIR PHOSPHATE 75 MG PO CAPS
75.0000 mg | ORAL_CAPSULE | Freq: Two times a day (BID) | ORAL | Status: DC
  Administered 2023-06-10 – 2023-06-12 (×5): 75 mg via ORAL
  Filled 2023-06-09 (×6): qty 1

## 2023-06-09 MED ORDER — BISACODYL 10 MG RE SUPP: 10.0000 mg | Freq: Every day | RECTAL | Status: DC | PRN

## 2023-06-09 MED ORDER — IPRATROPIUM-ALBUTEROL 0.5-2.5 (3) MG/3ML IN SOLN
9.0000 mL | Freq: Once | RESPIRATORY_TRACT | Status: AC
  Administered 2023-06-09: 9 mL via RESPIRATORY_TRACT
  Filled 2023-06-09: qty 9

## 2023-06-09 MED ORDER — HYDROCODONE-ACETAMINOPHEN 5-325 MG PO TABS
1.0000 | ORAL_TABLET | ORAL | Status: DC | PRN
  Administered 2023-06-10: 1 via ORAL
  Filled 2023-06-09: qty 1

## 2023-06-09 MED ORDER — GABAPENTIN 300 MG PO CAPS
300.0000 mg | ORAL_CAPSULE | Freq: Three times a day (TID) | ORAL | Status: DC
  Administered 2023-06-09 – 2023-06-12 (×10): 300 mg via ORAL
  Filled 2023-06-09 (×10): qty 1

## 2023-06-09 MED ORDER — SERTRALINE HCL 50 MG PO TABS
150.0000 mg | ORAL_TABLET | Freq: Every day | ORAL | Status: DC
  Administered 2023-06-09 – 2023-06-12 (×4): 150 mg via ORAL
  Filled 2023-06-09 (×4): qty 3

## 2023-06-09 MED ORDER — ALBUTEROL SULFATE (2.5 MG/3ML) 0.083% IN NEBU
10.0000 mg/h | INHALATION_SOLUTION | Freq: Once | RESPIRATORY_TRACT | Status: AC
  Administered 2023-06-09: 10 mg/h via RESPIRATORY_TRACT
  Filled 2023-06-09: qty 3

## 2023-06-09 MED ORDER — PANTOPRAZOLE SODIUM 40 MG PO TBEC
40.0000 mg | DELAYED_RELEASE_TABLET | Freq: Every day | ORAL | Status: DC
  Administered 2023-06-09 – 2023-06-12 (×4): 40 mg via ORAL
  Filled 2023-06-09 (×4): qty 1

## 2023-06-09 MED ORDER — METHYLPREDNISOLONE SODIUM SUCC 40 MG IJ SOLR
40.0000 mg | Freq: Every day | INTRAMUSCULAR | Status: DC
  Administered 2023-06-10 – 2023-06-11 (×2): 40 mg via INTRAVENOUS
  Filled 2023-06-09 (×2): qty 1

## 2023-06-09 MED ORDER — OSELTAMIVIR PHOSPHATE 75 MG PO CAPS
75.0000 mg | ORAL_CAPSULE | Freq: Once | ORAL | Status: AC
  Administered 2023-06-09: 75 mg via ORAL
  Filled 2023-06-09: qty 1

## 2023-06-09 MED ORDER — ACETAMINOPHEN 325 MG PO TABS
650.0000 mg | ORAL_TABLET | Freq: Four times a day (QID) | ORAL | Status: DC | PRN
Start: 2023-06-09 — End: 2023-06-12

## 2023-06-09 MED ORDER — ONDANSETRON HCL 4 MG PO TABS: 4.0000 mg | ORAL_TABLET | Freq: Four times a day (QID) | ORAL | Status: DC | PRN

## 2023-06-09 MED ORDER — IPRATROPIUM-ALBUTEROL 0.5-2.5 (3) MG/3ML IN SOLN
3.0000 mL | Freq: Four times a day (QID) | RESPIRATORY_TRACT | Status: DC | PRN
  Administered 2023-06-10 – 2023-06-12 (×3): 3 mL via RESPIRATORY_TRACT
  Filled 2023-06-09 (×3): qty 3

## 2023-06-09 MED ORDER — ENOXAPARIN SODIUM 40 MG/0.4ML IJ SOSY
40.0000 mg | PREFILLED_SYRINGE | INTRAMUSCULAR | Status: DC
  Administered 2023-06-10 – 2023-06-11 (×2): 40 mg via SUBCUTANEOUS
  Filled 2023-06-09 (×2): qty 0.4

## 2023-06-09 MED ORDER — MORPHINE SULFATE (PF) 2 MG/ML IV SOLN: 1.0000 mg | INTRAVENOUS | Status: DC | PRN

## 2023-06-09 NOTE — ED Notes (Signed)
 PT given grahm crackers and assisted back into bed and on vitals monitor. Pt denies any other needs at this time. PT able to ambulate ad lib

## 2023-06-09 NOTE — ED Provider Notes (Signed)
 Julia [name] Provider Note    Event Date/Time   First MD Initiated Contact with Patient 06/09/23 [phone]     (approximate)   History   Shortness of Breath   HPI  Julia [name] is a 63 y.o. female with history of COPD on 2.5 L nasal cannula at night, ending with shortness of breath.  States that shortness of breath started yesterday, also has a cough but is nonproductive.  Per EMS was satting 92% on room air, given 1 albuterol  as well as 125 mg Solu-Medrol .  Patient states that she does have sick contacts at home, states that she initially got over a viral illness last week.  No fevers, chest pain, nausea, vomiting, diarrhea, abdominal pain, back pain.  She did say that she had a ventral hernia repair done in November of last year.  No history of blood clots, no unilateral calf swelling or tenderness.  On independent chart review she was seen by general surgery on 10 January, has history of multiple abdominal surgeries, she underwent paraesophageal hernia repair, fundoplication, ventral hernia repair on 1111 of last year, had some diarrhea but C. difficile was negative.  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  And had a resection of a nodule from her posterior stomach.  Had referrals placed for surgical oncology.    Physical Exam   Triage Vital Signs: ED Triage Vitals  Encounter Vitals Group     BP 06/09/23 0740 130/80     Systolic BP Percentile --      Diastolic BP Percentile --      Pulse Rate 06/09/23 0739 76     Resp 06/09/23 0739 (!) 22     Temp 06/09/23 0740 98.1 F (36.7 C)     Temp Source 06/09/23 0740 Oral     SpO2 06/09/23 0739 94 %     Weight 06/09/23 0738 121 lb 4.1 oz (55 kg)     Height 06/09/23 0738 5' 5 (1.651 m)     Head Circumference --      Peak Flow --      Pain Score 06/09/23 0740 0     Pain Loc --      Pain Education --      Exclude from Growth Chart --     Most recent vital signs: Vitals:   06/09/23 0751 06/09/23 0751  BP:     Pulse:    Resp:    Temp:    SpO2: (!) 88% 98%     General: Awake, no distress.  CV:  Good peripheral perfusion.  Resp:  Normal effort.  Diminished, some wheezing on the right Abd:  No distention.  Nontender Other:  No unilateral calf swelling or tenderness.   ED Results / Procedures / Treatments   Labs (all labs ordered are listed, but only abnormal results are displayed) Labs Reviewed  RESP PANEL BY RT-PCR (RSV, FLU A&B, COVID)  [id number] - Abnormal; Notable for the following components:      Result Value   Influenza A by PCR POSITIVE (*)    All other components within normal limits  COMPREHENSIVE METABOLIC PANEL - Abnormal; Notable for the following components:   Chloride 96 (*)    All other components within normal limits  CBC WITH DIFFERENTIAL/PLATELET - Abnormal; Notable for the following components:   Platelets 116 (*)    All other components within normal limits  BLOOD GAS, VENOUS - Abnormal; Notable for the following components:   pH,  Ven 7.45 (*)    pO2, Ven 65 (*)    Bicarbonate 32.7 (*)    Acid-Base Excess 7.5 (*)    All other components within normal limits  D-DIMER, QUANTITATIVE (NOT AT [facility]) - Abnormal; Notable for the following components:   D-Dimer, Quant 0.70 (*)    All other components within normal limits  BRAIN NATRIURETIC PEPTIDE  HIV ANTIBODY (ROUTINE TESTING W REFLEX)  TROPONIN I (HIGH SENSITIVITY)     EKG  Sinus rhythm, rate of 70, normal QRS, normal QTc, no ischemic ST elevation, T wave flattening in aVL, V3, appears to have artifact and wandering baseline is for 1 and 2.  T wave changes are not new compared to prior   RADIOLOGY Chest x-ray on my interpretation with some opacities bilaterally.  Radiology interpretation was moderate hyperinflation with chronic cystic emphysematous changes with chronic bilateral interstitial thickening   PROCEDURES:  Critical Care performed: Yes, see critical care procedure note(s)  .Critical  Care  Performed by: Waymond Lorelle Cummins, MD Authorized by: Waymond Lorelle Cummins, MD   Critical care provider statement:    Critical care time (minutes):  35   Critical care was necessary to treat or prevent imminent or life-threatening deterioration of the following conditions:  Respiratory failure   Critical care was time spent personally by me on the following activities:  Development of treatment plan with patient or surrogate, discussions with consultants, evaluation of patient's response to treatment, examination of patient, ordering and review of laboratory studies, ordering and review of radiographic studies, ordering and performing treatments and interventions, pulse oximetry, re-evaluation of patient's condition and review of old charts    MEDICATIONS ORDERED IN ED: Medications  albuterol  (PROVENTIL ) (2.5 MG/3ML) 0.083% nebulizer solution (has no administration in time range)  oseltamivir  (TAMIFLU ) capsule 75 mg (has no administration in time range)  enoxaparin  (LOVENOX ) injection 40 mg (has no administration in time range)  acetaminophen  (TYLENOL ) tablet 650 mg (has no administration in time range)    Or  acetaminophen  (TYLENOL ) suppository 650 mg (has no administration in time range)  HYDROcodone -acetaminophen  (NORCO/VICODIN) 5-325 MG per tablet 1-2 tablet (has no administration in time range)  morphine  (PF) 2 MG/ML injection 1 mg (has no administration in time range)  ondansetron  (ZOFRAN ) tablet 4 mg (has no administration in time range)    Or  ondansetron  (ZOFRAN ) injection 4 mg (has no administration in time range)  bisacodyl  (DULCOLAX) suppository 10 mg (has no administration in time range)  ipratropium-albuterol  (DUONEB) 0.5-2.5 (3) MG/3ML nebulizer solution 3 mL (has no administration in time range)  ALPRAZolam  (XANAX ) tablet 0.5 mg (has no administration in time range)  docusate sodium  (COLACE) capsule 200 mg (has no administration in time range)  gabapentin  (NEURONTIN ) capsule 300 mg  (has no administration in time range)  pantoprazole  (PROTONIX ) EC tablet 40 mg (has no administration in time range)  sertraline  (ZOLOFT ) tablet 150 mg (has no administration in time range)  nicotine  (NICODERM CQ  - dosed in mg/24 hours) patch 14 mg (has no administration in time range)  ipratropium-albuterol  (DUONEB) 0.5-2.5 (3) MG/3ML nebulizer solution 9 mL (9 mLs Nebulization Given 06/09/23 0817)  iohexol  (OMNIPAQUE ) 350 MG/ML injection 75 mL (75 mLs Intravenous Contrast Given 06/09/23 0952)     IMPRESSION / MDM / ASSESSMENT AND PLAN / ED COURSE  I reviewed the triage vital signs and the nursing notes.  Differential diagnosis includes, but is not limited to, COPD exacerbation, influenza, COVID, RSV, pneumonia, ACS, given history of recent surgery in November and possible history of GIST considered PE, will get a D-dimer, labs, respiratory viral panel, VBG, chest x-ray.  Patient's presentation is most consistent with acute presentation with potential threat to life or bodily function.  Independent review of labs and imaging, D-dimer is elevated, if still elevated despite age adjustment, CT PE was negative for PE, no leukocytosis, pressures not severely deranged, her VBG is unremarkable, she is flu a positive.  On reassessment.  Now having bilateral wheezing, still tachypneic, will give her continuous albuterol .  Given that the symptoms started yesterday will also start on Tamiflu .  Given that she is high risk and not typically on oxygen  in the morning, we will have her admitted.  Consult to hospitalist who was agreeable for plan for admission and will evaluate the patient.  She is admitted.  Clinical Course as of 06/09/23 1128  Thu Jun 09, 2023  0855 DG Chest 2 View IMPRESSION: 1. Moderate hyperinflation and chronic cystic emphysematous changes. 2. Chronic bilateral interstitial thickening is similar to baseline 04/07/2015 radiographs but decreased from most recent  07/18/2022 radiograph. 3. Resolution of the prior superimposed patchy airspace opacities previously seen within the bilateral lungs on most recent 07/18/2022 radiograph.   [TT]  9092 Resp panel by RT-PCR (RSV, Flu A&B, Covid) Anterior Nasal Swab(!) Patient is influenza A positive [TT]    Clinical Course User Index [TT] Waymond, Lorelle Cummins, MD     FINAL CLINICAL IMPRESSION(S) / ED DIAGNOSES   Final diagnoses:  COPD exacerbation (HCC)  Influenza     Rx / DC Orders   ED Discharge Orders     None        Note:  This document was prepared using Dragon voice recognition software and may include unintentional dictation errors.    Waymond Lorelle Cummins, MD 06/09/23 [phone]

## 2023-06-09 NOTE — ED Triage Notes (Signed)
 Pt from home reporting increased shortness of breath. Hx of Asthma and COPD. PT wears 2.5 L [place] at night time as needed, pt was 92% RA for EMS. PT received albuterol , and 125 solumedrol. Pt does endorse having sick people at home.

## 2023-06-09 NOTE — H&P (Signed)
 History and Physical    Julia [name] FMW:[id number] DOB: 1961-02-07 DOA: 06/09/2023  PCP: Camelia Sherwood BIRCH, FNP  Patient coming from: home   Chief Complaint: shortness of breath   HPI: 63 y/o F w/ PMH of COPD, smoker, depression, anxiety who presents w/ shortness of breath x 1 day. The shortness of breath is at rest as well as with exertion. Pt tried her inhalers and CPAP at home but her shortness of breath did not improve. Pt has sick contacts at home w/ 64 year old that similar symptoms. Pt c/o subjective fever. Pt denies any chills, sweating, chest pain, nausea, vomiting, abd pain, dysuria, urinary urgency, urinary frequency, diarrhea or constipation. Pt was found to have COPD exacerbation & influenza in the ER.   Review of Systems: As per HPI otherwise 14 point review of systems negative.    Past Medical History:  Diagnosis Date   Anemia    Anxiety    Asthma    COPD (chronic obstructive pulmonary disease) (HCC)    Depression    Ventral hernia     Past Surgical History:  Procedure Laterality Date   COLON SURGERY     EAR TUBE REMOVAL     FRACTURE SURGERY     HERNIA REPAIR     TUBAL LIGATION       reports that she has been smoking cigarettes. She has never used smokeless tobacco. She reports that she does not drink alcohol and does not use drugs.  Allergies  Allergen Reactions   Banana Nausea And Vomiting    Family History  Problem Relation Age of Onset   Sarcoidosis Mother    Alcohol abuse Father     Prior to Admission medications   Medication Sig Start Date End Date Taking? Authorizing Provider  acetaminophen  (TYLENOL ) 500 MG tablet Take 1,000 mg by mouth every 6 (six) hours as needed.    [provider]  albuterol  (PROVENTIL ) (2.5 MG/3ML) 0.083% nebulizer solution Take 3 mLs (2.5 mg total) by nebulization every 2 (two) hours as needed for wheezing or shortness of breath. 04/20/22   Alexander, Natalie, DO  albuterol  (VENTOLIN  HFA) 108 (90 Base)  MCG/ACT inhaler Inhale 2 puffs into the lungs every 6 (six) hours as needed for wheezing or shortness of breath. 03/02/22   Leotis Bogus, MD  ALPRAZolam  (XANAX ) 0.5 MG tablet Take 0.5 mg by mouth 2 (two) times daily as needed.    [provider]  docusate sodium  (COLACE) 100 MG capsule Take 100 mg by mouth daily as needed for mild constipation.    [provider]  ferrous sulfate  325 (65 FE) MG tablet Take 1 tablet (325 mg total) by mouth daily. 10/05/21 02/26/22  Laurita Pillion, MD  gabapentin  (NEURONTIN ) 300 MG capsule Take 300 mg by mouth 3 (three) times daily. 07/05/22   [provider]  ipratropium-albuterol  (DUONEB) 0.5-2.5 (3) MG/3ML SOLN Take 3 mLs by nebulization every 4-6 (four to six) hours while awake as needed during acute COPD exacerbation for up to 5 days 04/20/22   Alexander, Natalie, DO  magnesium  oxide (MAG-OX) 400 MG tablet Take 400 mg by mouth daily.    [provider]  meloxicam (MOBIC) 15 MG tablet Take 15 mg by mouth daily. 07/01/22   [provider]  pantoprazole  (PROTONIX ) 40 MG tablet Take 40 mg by mouth daily.    [provider]  predniSONE  (DELTASONE ) 10 MG tablet 4 tabs daily for 3 days then 3 tabs daily for 3 days then  2 tabs daily for 3 days then 1 tab daily for 3 days 07/20/22   Wouk, Devaughn Sayres, MD  sertraline  (ZOLOFT ) 100 MG tablet Take 150 mg by mouth daily.    [provider]  TRELEGY ELLIPTA  100-62.5-25 MCG/ACT AEPB Inhale 1 puff into the lungs daily. 03/02/22   Leotis Bogus, MD  varenicline  (CHANTIX ) 1 MG tablet Take 1 mg by mouth 2 (two) times daily.    [provider]    Physical Exam: Vitals:   06/09/23 0740 06/09/23 0740 06/09/23 0751 06/09/23 0751  BP: 130/80     Pulse: 77     Resp: (!) 31     Temp:  98.1 F (36.7 C)    TempSrc:  Oral    SpO2: 92%  (!) 88% 98%  Weight:      Height:        Constitutional: NAD, calm, comfortable Vitals:   06/09/23 0740 06/09/23 0740 06/09/23  0751 06/09/23 0751  BP: 130/80     Pulse: 77     Resp: (!) 31     Temp:  98.1 F (36.7 C)    TempSrc:  Oral    SpO2: 92%  (!) 88% 98%  Weight:      Height:       Eyes: PERRL, lids and conjunctivae normal ENMT: Mucous membranes are moist.  Neck: normal, supple Respiratory: diminished breath sounds b/l. Intermittent wheezes  Cardiovascular: Regular rate and rhythm, no rubs / gallops.  Abdomen: soft, obese, no tenderness, bowel sounds present  Musculoskeletal: no clubbing / cyanosis. No joint deformity upper and lower extremities.  Normal muscle tone.  Skin: no rashes, lesions, ulcers. Neurologic: CN 2-12 grossly intact. Strength 5/5 in all 4.  Psychiatric: Normal judgment and insight. Alert and oriented x 3. Flat mood   Labs on Admission: I have personally reviewed following labs and imaging studies  CBC: Recent Labs  Lab 06/09/23 0741  WBC 4.3  NEUTROABS 2.9  HGB 12.2  HCT 38.8  MCV 83.1  PLT 116*   Basic Metabolic Panel: Recent Labs  Lab 06/09/23 0741  NA 135  K 3.7  CL 96*  CO2 28  GLUCOSE 95  BUN 12  CREATININE 0.49  CALCIUM 9.7   GFR: Estimated Creatinine Clearance: 62.5 mL/min (by C-G formula based on SCr of 0.49 mg/dL). Liver Function Tests: Recent Labs  Lab 06/09/23 0741  AST 20  ALT 11  ALKPHOS 56  BILITOT <0.2  PROT 7.3  ALBUMIN 3.7   No results for input(s): LIPASE, AMYLASE in the last 168 hours. No results for input(s): AMMONIA in the last 168 hours. Coagulation Profile: No results for input(s): INR, PROTIME in the last 168 hours. Cardiac Enzymes: No results for input(s): CKTOTAL, CKMB, CKMBINDEX, TROPONINI in the last 168 hours. BNP (last 3 results) No results for input(s): PROBNP in the last 8760 hours. HbA1C: No results for input(s): HGBA1C in the last 72 hours. CBG: No results for input(s): GLUCAP in the last 168 hours. Lipid Profile: No results for input(s): CHOL, HDL, LDLCALC, TRIG, CHOLHDL,  LDLDIRECT in the last 72 hours. Thyroid Function Tests: No results for input(s): TSH, T4TOTAL, FREET4, T3FREE, THYROIDAB in the last 72 hours. Anemia Panel: No results for input(s): VITAMINB12, FOLATE, FERRITIN, TIBC, IRON , RETICCTPCT in the last 72 hours. Urine analysis:    Component Value Date/Time   COLORURINE YELLOW (A) 09/23/2021 1424   APPEARANCEUR CLEAR (A) 09/23/2021 1424   LABSPEC 1.023 09/23/2021 1424   PHURINE 5.0 09/23/2021 1424  GLUCOSEU 50 (A) 09/23/2021 1424   HGBUR NEGATIVE 09/23/2021 1424   BILIRUBINUR NEGATIVE 09/23/2021 1424   KETONESUR NEGATIVE 09/23/2021 1424   PROTEINUR NEGATIVE 09/23/2021 1424   NITRITE NEGATIVE 09/23/2021 1424   LEUKOCYTESUR NEGATIVE 09/23/2021 1424    Radiological Exams on Admission: CT Angio Chest PE W/Cm &/Or Wo Cm Result Date: 06/09/2023 CLINICAL DATA:  63 year old female with shortness of breath. Sick contacts. Abnormal D-dimer. EXAM: CT ANGIOGRAPHY CHEST WITH CONTRAST TECHNIQUE: Multidetector CT imaging of the chest was performed using the standard protocol during bolus administration of intravenous contrast. Multiplanar CT image reconstructions and MIPs were obtained to evaluate the vascular anatomy. RADIATION DOSE REDUCTION: This exam was performed according to the departmental dose-optimization program which includes automated exposure control, adjustment of the mA and/or kV according to patient size and/or use of iterative reconstruction technique. CONTRAST:  75mL OMNIPAQUE  IOHEXOL  350 MG/ML SOLN COMPARISON:  Chest radiographs 0809 hours today. Prior chest CTA 09/23/2021. FINDINGS: Cardiovascular: Good contrast bolus timing in the pulmonary arterial tree. No pulmonary artery filling defect identified. Calcified aortic atherosclerosis. Calcified coronary artery atherosclerosis on series 6, image 244. Stable heart size, upper limits of normal. No pericardial effusion. Mediastinum/Nodes: Mediastinal lymph nodes have  regressed since 2023, appear reactive. No mediastinal mass. Resolved gastric hiatal hernia since 2023, probably was surgically repaired. Lungs/Pleura: Intermittent respiratory motion. Major airways are patent with bilateral bronchial wall thickening. Underlying centrilobular emphysema redemonstrated. Chronic postinflammatory nodularity in the left upper lobe is stable since 2023 and benign. Similar small left upper lobe lung nodules on series 5, image 50 and 55 are stable and benign (no follow-up imaging recommended). Similar chronic postinflammatory scarring in the superior segment of the left lower lobe is stable. Chronic probably distal mucoid impacted airways on series 5, image 105 appears stable with otherwise improved right lung base ventilation compared to the previous CT. And linear chronic posterior basal segment right lower lobe atelectasis or scarring is associated with a small chronic Bochdalek's hernia there. No pleural effusion. No convincing acute lung inflammation. Upper Abdomen: Negative visible essentially noncontrast liver, spleen, pancreas, adrenal glands, left kidney and stomach. Musculoskeletal: chronic T11 compression fracture is stable. Underlying scoliosis and spine degeneration. Occasional chronic rib fractures. No acute osseous abnormality identified. Review of the MIP images confirms the above findings. IMPRESSION: 1. Negative for acute pulmonary embolus. 2. Chronic lung disease with, Emphysema (ICD10-J43.9) and stable postinflammatory changes since 2023. Bilateral airway thickening which could be acute or chronic Bronchitis. No other acute lung inflammation. No pleural effusion. 3. Aortic Atherosclerosis (ICD10-I70.0). Repaired gastric hiatal hernia since 2023. Electronically Signed   By: VEAR Hurst M.D.   On: 06/09/2023 10:37   DG Chest 2 View Result Date: 06/09/2023 CLINICAL DATA:  Shortness of breath.  History of asthma and COPD. EXAM: CHEST - 2 VIEW COMPARISON:  Chest radiographs  07/18/2022, 04/19/2022; CT chest 02/26/2022, chest radiographs 02/26/2022 and 09/28/2021 FINDINGS: Cardiac silhouette and mediastinal contours are within limits. There is again flattening of the diaphragms and moderate hyperinflation. Chronic bilateral interstitial thickening is similar to baseline 04/07/2015 radiographs but decreased from 07/18/2022. Cystic emphysematous changes are seen as also seen on 02/26/2022 CT. The interstitial thickening is again greatest within the right lower lung, similar to baseline 04/07/2015 radiograph. Resolution of the prior superimposed patchy airspace opacities previously seen within the bilateral lungs on most recent 07/18/2022 radiograph. No pleural effusion pneumothorax. Moderate multilevel degenerative disc changes of the thoracic spine. Moderate chronic anterior T11 vertebral body height loss is unchanged from 02/26/2022  CT. IMPRESSION: 1. Moderate hyperinflation and chronic cystic emphysematous changes. 2. Chronic bilateral interstitial thickening is similar to baseline 04/07/2015 radiographs but decreased from most recent 07/18/2022 radiograph. 3. Resolution of the prior superimposed patchy airspace opacities previously seen within the bilateral lungs on most recent 07/18/2022 radiograph. Electronically Signed   By: Tanda Lyons M.D.   On: 06/09/2023 08:37    EKG: Independently reviewed.   Assessment/Plan Principal Problem:   Influenza  Acute hypoxic respiratory failure: found to be 88% on RA. Likely secondary to COPD exacerbation & influenza. Continue on supplemental oxygen  and wean as tolerated.   COPD exacerbation: likely secondary to influenza. Continue on IV steroids, bronchodilators & encourage incentive spirometry   Influenza: continue on tamiflu . Continue w/ supportive care. Droplet precautions  Smoker: received smoking cessation counseling x 5 mins. Nicotine  patch to prevent w/drawl  Thrombocytopenia: etiology unclear. Will continue to monitor    Depression: severity unknown. Continue on home dose of sertraline   GERD: continue on PPI   DVT prophylaxis: lovenox   Code Status: full  Family Communication: discussed pt's care w/ pt's family at bedside and answered their questions  Disposition Plan: likely d/c back home  Consults called:  none Admission status: inpatient    Anthony CHRISTELLA Pouch MD [facility]  If 7PM-7AM, please contact night-coverage [email or web address]  06/09/2023, 1:59 PM

## 2023-06-10 ENCOUNTER — Other Ambulatory Visit: Payer: Self-pay

## 2023-06-10 ENCOUNTER — Encounter: Payer: Self-pay | Admitting: Internal Medicine

## 2023-06-10 DIAGNOSIS — J111 Influenza due to unidentified influenza virus with other respiratory manifestations: Secondary | ICD-10-CM | POA: Diagnosis not present

## 2023-06-10 LAB — CBC
HCT: 40.8 % (ref 36.0–46.0)
Hemoglobin: 12.5 g/dL (ref 12.0–15.0)
MCH: 26.2 pg (ref 26.0–34.0)
MCHC: 30.6 g/dL (ref 30.0–36.0)
MCV: 85.4 fL (ref 80.0–100.0)
Platelets: 113 10*3/uL — ABNORMAL LOW (ref 150–400)
RBC: 4.78 MIL/uL (ref 3.87–5.11)
RDW: 14 % (ref 11.5–15.5)
WBC: 4.6 10*3/uL (ref 4.0–10.5)
nRBC: 0 % (ref 0.0–0.2)

## 2023-06-10 LAB — HIV ANTIBODY (ROUTINE TESTING W REFLEX): HIV Screen 4th Generation wRfx: NONREACTIVE

## 2023-06-10 MED ORDER — GUAIFENESIN ER 600 MG PO TB12
600.0000 mg | ORAL_TABLET | Freq: Two times a day (BID) | ORAL | Status: DC
  Administered 2023-06-10 – 2023-06-12 (×5): 600 mg via ORAL
  Filled 2023-06-10 (×5): qty 1

## 2023-06-10 NOTE — ED Notes (Signed)
 Pt ate about 25% of food

## 2023-06-10 NOTE — ED Notes (Signed)
 PT given pair of mesh underwear, pt updated on her room status.

## 2023-06-10 NOTE — ED Notes (Addendum)
 PT tolerated being on room air, oxygen  saturation at 95%. Hospitalist notified.

## 2023-06-10 NOTE — Progress Notes (Signed)
 PROGRESS NOTE    Julia [name]  FMW:[id number] DOB: 1960/09/26 DOA: 06/09/2023 PCP: Camelia Sherwood BIRCH, FNP   Assessment & Plan:   Principal Problem:   Influenza  Assessment and Plan:   Acute hypoxic respiratory failure: found to be 88% on RA. Likely secondary to COPD exacerbation & influenza. Weaned off of supplemental oxygen .    COPD exacerbation: likely secondary to influenza. Continue on steroids, bronchodilators & encourage incentive spirometry    Influenza: continue on tamiflu . Continue w/ supportive care. Droplet precautions    Smoker: received smoking cessation counseling x 5 mins. Nicotine  patch to prevent w/drawl   Thrombocytopenia: etiology unclear. Will continue to monitor    Depression: severity unknown. Continue on home dose of sertraline     GERD: continue on PPI      DVT prophylaxis: lovenox   Code Status: full  Family Communication:  Disposition Plan: likely d/c back home   Level of care: Med-Surg  Status is: Inpatient Remains inpatient appropriate because: severity of illness, likely d/c tomorrow    Consultants:    Procedures:   Antimicrobials:    Subjective: Pt c/o shortness of breath   Objective: Vitals:   06/09/23 2300 06/10/23 0000 06/10/23 0030 06/10/23 0639  BP: 115/79 117/81 115/79 116/67  Pulse:   72 76  Resp: (!) 26 (!) 29 19 18   Temp:   98.8 F (37.1 C) 98.2 F (36.8 C)  TempSrc:    Oral  SpO2: 100% 99% 100% 98%  Weight:      Height:       No intake or output data in the 24 hours ending 06/10/23 0911 Filed Weights   06/09/23 0738  Weight: 55 kg    Examination:  General exam: Appears calm and comfortable  Respiratory system: diminished breath sounds b/l. Intermittent wheezes b/l  Cardiovascular system: S1 & S2+. No rubs, gallops or clicks.  Gastrointestinal system: Abdomen is nondistended, soft and nontender. Normal bowel sounds heard. Central nervous system: Alert and oriented. Moves all extremities   Psychiatry: Judgement and insight appear normal. Flat mood and affect    Data Reviewed: I have personally reviewed following labs and imaging studies  CBC: Recent Labs  Lab 06/09/23 0741 06/10/23 0504  WBC 4.3 4.6  NEUTROABS 2.9  --   HGB 12.2 12.5  HCT 38.8 40.8  MCV 83.1 85.4  PLT 116* 113*   Basic Metabolic Panel: Recent Labs  Lab 06/09/23 0741  NA 135  K 3.7  CL 96*  CO2 28  GLUCOSE 95  BUN 12  CREATININE 0.49  CALCIUM 9.7   GFR: Estimated Creatinine Clearance: 62.5 mL/min (by C-G formula based on SCr of 0.49 mg/dL). Liver Function Tests: Recent Labs  Lab 06/09/23 0741  AST 20  ALT 11  ALKPHOS 56  BILITOT <0.2  PROT 7.3  ALBUMIN 3.7   No results for input(s): LIPASE, AMYLASE in the last 168 hours. No results for input(s): AMMONIA in the last 168 hours. Coagulation Profile: No results for input(s): INR, PROTIME in the last 168 hours. Cardiac Enzymes: No results for input(s): CKTOTAL, CKMB, CKMBINDEX, TROPONINI in the last 168 hours. BNP (last 3 results) No results for input(s): PROBNP in the last 8760 hours. HbA1C: No results for input(s): HGBA1C in the last 72 hours. CBG: No results for input(s): GLUCAP in the last 168 hours. Lipid Profile: No results for input(s): CHOL, HDL, LDLCALC, TRIG, CHOLHDL, LDLDIRECT in the last 72 hours. Thyroid Function Tests: No results for input(s): TSH, T4TOTAL, FREET4, T3FREE, THYROIDAB  in the last 72 hours. Anemia Panel: No results for input(s): VITAMINB12, FOLATE, FERRITIN, TIBC, IRON , RETICCTPCT in the last 72 hours. Sepsis Labs: No results for input(s): PROCALCITON, LATICACIDVEN in the last 168 hours.  Recent Results (from the past 240 hours)  Resp panel by RT-PCR (RSV, Flu A&B, Covid) Anterior Nasal Swab     Status: Abnormal   Collection Time: 06/09/23  7:41 AM   Specimen: Anterior Nasal Swab  Result Value Ref Range Status   SARS Coronavirus 2  by RT PCR NEGATIVE NEGATIVE Final    Comment: (NOTE) SARS-CoV-2 target nucleic acids are NOT DETECTED.  The SARS-CoV-2 RNA is generally detectable in upper respiratory specimens during the acute phase of infection. The lowest concentration of SARS-CoV-2 viral copies this assay can detect is 138 copies/mL. A negative result does not preclude SARS-Cov-2 infection and should not be used as the sole basis for treatment or other patient management decisions. A negative result may occur with  improper specimen collection/handling, submission of specimen other than nasopharyngeal swab, presence of viral mutation(s) within the areas targeted by this assay, and inadequate number of viral copies(<138 copies/mL). A negative result must be combined with clinical observations, patient history, and epidemiological information. The expected result is Negative.  Fact Sheet for Patients:  bloggercourse.com  Fact Sheet for Healthcare Providers:  seriousbroker.it  This test is no t yet approved or cleared by the [place]  FDA and  has been authorized for detection and/or diagnosis of SARS-CoV-2 by FDA under an Emergency Use Authorization (EUA). This EUA will remain  in effect (meaning this test can be used) for the duration of the COVID-19 declaration under Section 564(b)(1) of the Act, 21 U.S.C.section 360bbb-3(b)(1), unless the authorization is terminated  or revoked sooner.       Influenza A by PCR POSITIVE (A) NEGATIVE Final   Influenza B by PCR NEGATIVE NEGATIVE Final    Comment: (NOTE) The Xpert Xpress SARS-CoV-2/FLU/RSV plus assay is intended as an aid in the diagnosis of influenza from Nasopharyngeal swab specimens and should not be used as a sole basis for treatment. Nasal washings and aspirates are unacceptable for Xpert Xpress SARS-CoV-2/FLU/RSV testing.  Fact Sheet for Patients: bloggercourse.com  Fact  Sheet for Healthcare Providers: seriousbroker.it  This test is not yet approved or cleared by the [place]  FDA and has been authorized for detection and/or diagnosis of SARS-CoV-2 by FDA under an Emergency Use Authorization (EUA). This EUA will remain in effect (meaning this test can be used) for the duration of the COVID-19 declaration under Section 564(b)(1) of the Act, 21 U.S.C. section 360bbb-3(b)(1), unless the authorization is terminated or revoked.     Resp Syncytial Virus by PCR NEGATIVE NEGATIVE Final    Comment: (NOTE) Fact Sheet for Patients: bloggercourse.com  Fact Sheet for Healthcare Providers: seriousbroker.it  This test is not yet approved or cleared by the [place]  FDA and has been authorized for detection and/or diagnosis of SARS-CoV-2 by FDA under an Emergency Use Authorization (EUA). This EUA will remain in effect (meaning this test can be used) for the duration of the COVID-19 declaration under Section 564(b)(1) of the Act, 21 U.S.C. section 360bbb-3(b)(1), unless the authorization is terminated or revoked.  Performed at [facility], [address], [place], [place] [address]          Radiology Studies: CT Angio Chest PE W/Cm &/Or Wo Cm Result Date: 06/09/2023 CLINICAL DATA:  63 year old female with shortness of breath. Sick contacts. Abnormal D-dimer. EXAM: CT  ANGIOGRAPHY CHEST WITH CONTRAST TECHNIQUE: Multidetector CT imaging of the chest was performed using the standard protocol during bolus administration of intravenous contrast. Multiplanar CT image reconstructions and MIPs were obtained to evaluate the vascular anatomy. RADIATION DOSE REDUCTION: This exam was performed according to the departmental dose-optimization program which includes automated exposure control, adjustment of the mA and/or kV according to patient size and/or use of iterative  reconstruction technique. CONTRAST:  75mL OMNIPAQUE  IOHEXOL  350 MG/ML SOLN COMPARISON:  Chest radiographs 0809 hours today. Prior chest CTA 09/23/2021. FINDINGS: Cardiovascular: Good contrast bolus timing in the pulmonary arterial tree. No pulmonary artery filling defect identified. Calcified aortic atherosclerosis. Calcified coronary artery atherosclerosis on series 6, image 244. Stable heart size, upper limits of normal. No pericardial effusion. Mediastinum/Nodes: Mediastinal lymph nodes have regressed since 2023, appear reactive. No mediastinal mass. Resolved gastric hiatal hernia since 2023, probably was surgically repaired. Lungs/Pleura: Intermittent respiratory motion. Major airways are patent with bilateral bronchial wall thickening. Underlying centrilobular emphysema redemonstrated. Chronic postinflammatory nodularity in the left upper lobe is stable since 2023 and benign. Similar small left upper lobe lung nodules on series 5, image 50 and 55 are stable and benign (no follow-up imaging recommended). Similar chronic postinflammatory scarring in the superior segment of the left lower lobe is stable. Chronic probably distal mucoid impacted airways on series 5, image 105 appears stable with otherwise improved right lung base ventilation compared to the previous CT. And linear chronic posterior basal segment right lower lobe atelectasis or scarring is associated with a small chronic Bochdalek's hernia there. No pleural effusion. No convincing acute lung inflammation. Upper Abdomen: Negative visible essentially noncontrast liver, spleen, pancreas, adrenal glands, left kidney and stomach. Musculoskeletal: chronic T11 compression fracture is stable. Underlying scoliosis and spine degeneration. Occasional chronic rib fractures. No acute osseous abnormality identified. Review of the MIP images confirms the above findings. IMPRESSION: 1. Negative for acute pulmonary embolus. 2. Chronic lung disease with, Emphysema  (ICD10-J43.9) and stable postinflammatory changes since 2023. Bilateral airway thickening which could be acute or chronic Bronchitis. No other acute lung inflammation. No pleural effusion. 3. Aortic Atherosclerosis (ICD10-I70.0). Repaired gastric hiatal hernia since 2023. Electronically Signed   By: VEAR Hurst M.D.   On: 06/09/2023 10:37   DG Chest 2 View Result Date: 06/09/2023 CLINICAL DATA:  Shortness of breath.  History of asthma and COPD. EXAM: CHEST - 2 VIEW COMPARISON:  Chest radiographs 07/18/2022, 04/19/2022; CT chest 02/26/2022, chest radiographs 02/26/2022 and 09/28/2021 FINDINGS: Cardiac silhouette and mediastinal contours are within limits. There is again flattening of the diaphragms and moderate hyperinflation. Chronic bilateral interstitial thickening is similar to baseline 04/07/2015 radiographs but decreased from 07/18/2022. Cystic emphysematous changes are seen as also seen on 02/26/2022 CT. The interstitial thickening is again greatest within the right lower lung, similar to baseline 04/07/2015 radiograph. Resolution of the prior superimposed patchy airspace opacities previously seen within the bilateral lungs on most recent 07/18/2022 radiograph. No pleural effusion pneumothorax. Moderate multilevel degenerative disc changes of the thoracic spine. Moderate chronic anterior T11 vertebral body height loss is unchanged from 02/26/2022 CT. IMPRESSION: 1. Moderate hyperinflation and chronic cystic emphysematous changes. 2. Chronic bilateral interstitial thickening is similar to baseline 04/07/2015 radiographs but decreased from most recent 07/18/2022 radiograph. 3. Resolution of the prior superimposed patchy airspace opacities previously seen within the bilateral lungs on most recent 07/18/2022 radiograph. Electronically Signed   By: Tanda Lyons M.D.   On: 06/09/2023 08:37        Scheduled Meds:  enoxaparin  (LOVENOX )  injection  40 mg Subcutaneous Q24H   gabapentin   300 mg Oral TID    methylPREDNISolone  (SOLU-MEDROL ) injection  40 mg Intravenous Daily   nicotine   14 mg Transdermal Daily   oseltamivir   75 mg Oral BID   pantoprazole   40 mg Oral Daily   sertraline   150 mg Oral Daily   Continuous Infusions:   LOS: 1 day        Anthony CHRISTELLA Pouch, MD [facility] Pager [phone]  If 7PM-7AM, please contact night-coverage [email or web address] 06/10/2023, 9:11 AM

## 2023-06-11 DIAGNOSIS — J111 Influenza due to unidentified influenza virus with other respiratory manifestations: Secondary | ICD-10-CM | POA: Diagnosis not present

## 2023-06-11 LAB — CBC
HCT: 40.7 % (ref 36.0–46.0)
Hemoglobin: 12.6 g/dL (ref 12.0–15.0)
MCH: 26.1 pg (ref 26.0–34.0)
MCHC: 31 g/dL (ref 30.0–36.0)
MCV: 84.3 fL (ref 80.0–100.0)
Platelets: 100 10*3/uL — ABNORMAL LOW (ref 150–400)
RBC: 4.83 MIL/uL (ref 3.87–5.11)
RDW: 13.8 % (ref 11.5–15.5)
WBC: 5.4 10*3/uL (ref 4.0–10.5)
nRBC: 0 % (ref 0.0–0.2)

## 2023-06-11 NOTE — Progress Notes (Signed)
 PROGRESS NOTE    Julia [name]  FMW:[id number] DOB: [birth year] DOA: 06/09/2023 PCP: Camelia Sherwood BIRCH, FNP   Assessment & Plan:   Principal Problem:   Influenza  Assessment and Plan:   Acute hypoxic respiratory failure: found to be 88% on RA. Likely secondary to COPD exacerbation & influenza. Weaned off of supplemental oxygen . Resolved    COPD exacerbation: likely secondary to influenza. Continue on steroids, bronchodilators & encourage incentive spirometry    Influenza: continue on tamiflu . Continue w/ supportive care. Droplet precautions    Smoker: received smoking cessation counseling x 5 mins. Nicotine  patch to prevent w/drawl   Thrombocytopenia: etiology unclear. Will continue to monitor     Depression: severity unknown. Continue on home dose of sertraline    GERD: continue on PPI      DVT prophylaxis: lovenox   Code Status: full  Family Communication:  Disposition Plan: likely d/c back home   Level of care: Med-Surg  Status is: Inpatient Remains inpatient appropriate because: severity of illness, likely will d/c home tomorrow    Consultants:    Procedures:   Antimicrobials:    Subjective: Pt still c/o shortness of breath   Objective: Vitals:   06/10/23 1800 06/10/23 1841 06/10/23 2009 06/11/23 0353  BP: 115/65 100/72 91/66 102/71  Pulse:  80 88 75  Resp: (!) 30 16 18 18   Temp:  98 F (36.7 C) 98 F (36.7 C) 98.8 F (37.1 C)  TempSrc:      SpO2: (!) 89% 95% 96% 92%  Weight: 50.7 kg     Height:       No intake or output data in the 24 hours ending 06/11/23 0800 Filed Weights   06/09/23 0738 06/10/23 1800  Weight: 55 kg 50.7 kg    Examination:  General exam: Appears comfortable  Respiratory system: decreased breath sounds b/l. Intermittent wheezes b/l  Cardiovascular system: S1/S2+. No rubs or clicks   Gastrointestinal system: Abd is soft, NT, ND & hyperactive bowel sounds  Central nervous system: Alert & oriented. Moves all  extremities  Psychiatry: Judgement and insight appear normal. Flat mood and affect    Data Reviewed: I have personally reviewed following labs and imaging studies  CBC: Recent Labs  Lab 06/09/23 0741 06/10/23 0504 06/11/23 0519  WBC 4.3 4.6 5.4  NEUTROABS 2.9  --   --   HGB 12.2 12.5 12.6  HCT 38.8 40.8 40.7  MCV 83.1 85.4 84.3  PLT 116* 113* 100*   Basic Metabolic Panel: Recent Labs  Lab 06/09/23 0741  NA 135  K 3.7  CL 96*  CO2 28  GLUCOSE 95  BUN 12  CREATININE 0.49  CALCIUM 9.7   GFR: Estimated Creatinine Clearance: 57.6 mL/min (by C-G formula based on SCr of 0.49 mg/dL). Liver Function Tests: Recent Labs  Lab 06/09/23 0741  AST 20  ALT 11  ALKPHOS 56  BILITOT <0.2  PROT 7.3  ALBUMIN 3.7   No results for input(s): LIPASE, AMYLASE in the last 168 hours. No results for input(s): AMMONIA in the last 168 hours. Coagulation Profile: No results for input(s): INR, PROTIME in the last 168 hours. Cardiac Enzymes: No results for input(s): CKTOTAL, CKMB, CKMBINDEX, TROPONINI in the last 168 hours. BNP (last 3 results) No results for input(s): PROBNP in the last 8760 hours. HbA1C: No results for input(s): HGBA1C in the last 72 hours. CBG: No results for input(s): GLUCAP in the last 168 hours. Lipid Profile: No results for input(s): CHOL, HDL, LDLCALC, TRIG, CHOLHDL,  LDLDIRECT in the last 72 hours. Thyroid Function Tests: No results for input(s): TSH, T4TOTAL, FREET4, T3FREE, THYROIDAB in the last 72 hours. Anemia Panel: No results for input(s): VITAMINB12, FOLATE, FERRITIN, TIBC, IRON , RETICCTPCT in the last 72 hours. Sepsis Labs: No results for input(s): PROCALCITON, LATICACIDVEN in the last 168 hours.  Recent Results (from the past 240 hours)  Resp panel by RT-PCR (RSV, Flu A&B, Covid) Anterior Nasal Swab     Status: Abnormal   Collection Time: 06/09/23  7:41 AM   Specimen: Anterior Nasal  Swab  Result Value Ref Range Status   SARS Coronavirus 2 by RT PCR NEGATIVE NEGATIVE Final    Comment: (NOTE) SARS-CoV-2 target nucleic acids are NOT DETECTED.  The SARS-CoV-2 RNA is generally detectable in upper respiratory specimens during the acute phase of infection. The lowest concentration of SARS-CoV-2 viral copies this assay can detect is 138 copies/mL. A negative result does not preclude SARS-Cov-2 infection and should not be used as the sole basis for treatment or other patient management decisions. A negative result may occur with  improper specimen collection/handling, submission of specimen other than nasopharyngeal swab, presence of viral mutation(s) within the areas targeted by this assay, and inadequate number of viral copies(<138 copies/mL). A negative result must be combined with clinical observations, patient history, and epidemiological information. The expected result is Negative.  Fact Sheet for Patients:  bloggercourse.com  Fact Sheet for Healthcare Providers:  seriousbroker.it  This test is no t yet approved or cleared by the [place]  FDA and  has been authorized for detection and/or diagnosis of SARS-CoV-2 by FDA under an Emergency Use Authorization (EUA). This EUA will remain  in effect (meaning this test can be used) for the duration of the COVID-19 declaration under Section 564(b)(1) of the Act, 21 U.S.C.section 360bbb-3(b)(1), unless the authorization is terminated  or revoked sooner.       Influenza A by PCR POSITIVE (A) NEGATIVE Final   Influenza B by PCR NEGATIVE NEGATIVE Final    Comment: (NOTE) The Xpert Xpress SARS-CoV-2/FLU/RSV plus assay is intended as an aid in the diagnosis of influenza from Nasopharyngeal swab specimens and should not be used as a sole basis for treatment. Nasal washings and aspirates are unacceptable for Xpert Xpress SARS-CoV-2/FLU/RSV testing.  Fact Sheet for  Patients: bloggercourse.com  Fact Sheet for Healthcare Providers: seriousbroker.it  This test is not yet approved or cleared by the [place]  FDA and has been authorized for detection and/or diagnosis of SARS-CoV-2 by FDA under an Emergency Use Authorization (EUA). This EUA will remain in effect (meaning this test can be used) for the duration of the COVID-19 declaration under Section 564(b)(1) of the Act, 21 U.S.C. section 360bbb-3(b)(1), unless the authorization is terminated or revoked.     Resp Syncytial Virus by PCR NEGATIVE NEGATIVE Final    Comment: (NOTE) Fact Sheet for Patients: bloggercourse.com  Fact Sheet for Healthcare Providers: seriousbroker.it  This test is not yet approved or cleared by the [place]  FDA and has been authorized for detection and/or diagnosis of SARS-CoV-2 by FDA under an Emergency Use Authorization (EUA). This EUA will remain in effect (meaning this test can be used) for the duration of the COVID-19 declaration under Section 564(b)(1) of the Act, 21 U.S.C. section 360bbb-3(b)(1), unless the authorization is terminated or revoked.  Performed at [facility], [address], [place], [place] [address]          Radiology Studies: CT Angio Chest PE W/Cm &/Or Wo Cm  Result Date: 06/09/2023 CLINICAL DATA:  63 year old female with shortness of breath. Sick contacts. Abnormal D-dimer. EXAM: CT ANGIOGRAPHY CHEST WITH CONTRAST TECHNIQUE: Multidetector CT imaging of the chest was performed using the standard protocol during bolus administration of intravenous contrast. Multiplanar CT image reconstructions and MIPs were obtained to evaluate the vascular anatomy. RADIATION DOSE REDUCTION: This exam was performed according to the departmental dose-optimization program which includes automated exposure control, adjustment of the mA and/or kV  according to patient size and/or use of iterative reconstruction technique. CONTRAST:  75mL OMNIPAQUE  IOHEXOL  350 MG/ML SOLN COMPARISON:  Chest radiographs 0809 hours today. Prior chest CTA 09/23/2021. FINDINGS: Cardiovascular: Good contrast bolus timing in the pulmonary arterial tree. No pulmonary artery filling defect identified. Calcified aortic atherosclerosis. Calcified coronary artery atherosclerosis on series 6, image 244. Stable heart size, upper limits of normal. No pericardial effusion. Mediastinum/Nodes: Mediastinal lymph nodes have regressed since 2023, appear reactive. No mediastinal mass. Resolved gastric hiatal hernia since 2023, probably was surgically repaired. Lungs/Pleura: Intermittent respiratory motion. Major airways are patent with bilateral bronchial wall thickening. Underlying centrilobular emphysema redemonstrated. Chronic postinflammatory nodularity in the left upper lobe is stable since 2023 and benign. Similar small left upper lobe lung nodules on series 5, image 50 and 55 are stable and benign (no follow-up imaging recommended). Similar chronic postinflammatory scarring in the superior segment of the left lower lobe is stable. Chronic probably distal mucoid impacted airways on series 5, image 105 appears stable with otherwise improved right lung base ventilation compared to the previous CT. And linear chronic posterior basal segment right lower lobe atelectasis or scarring is associated with a small chronic Bochdalek's hernia there. No pleural effusion. No convincing acute lung inflammation. Upper Abdomen: Negative visible essentially noncontrast liver, spleen, pancreas, adrenal glands, left kidney and stomach. Musculoskeletal: chronic T11 compression fracture is stable. Underlying scoliosis and spine degeneration. Occasional chronic rib fractures. No acute osseous abnormality identified. Review of the MIP images confirms the above findings. IMPRESSION: 1. Negative for acute pulmonary  embolus. 2. Chronic lung disease with, Emphysema (ICD10-J43.9) and stable postinflammatory changes since 2023. Bilateral airway thickening which could be acute or chronic Bronchitis. No other acute lung inflammation. No pleural effusion. 3. Aortic Atherosclerosis (ICD10-I70.0). Repaired gastric hiatal hernia since 2023. Electronically Signed   By: VEAR Hurst M.D.   On: 06/09/2023 10:37   DG Chest 2 View Result Date: 06/09/2023 CLINICAL DATA:  Shortness of breath.  History of asthma and COPD. EXAM: CHEST - 2 VIEW COMPARISON:  Chest radiographs 07/18/2022, 04/19/2022; CT chest 02/26/2022, chest radiographs 02/26/2022 and 09/28/2021 FINDINGS: Cardiac silhouette and mediastinal contours are within limits. There is again flattening of the diaphragms and moderate hyperinflation. Chronic bilateral interstitial thickening is similar to baseline 04/07/2015 radiographs but decreased from 07/18/2022. Cystic emphysematous changes are seen as also seen on 02/26/2022 CT. The interstitial thickening is again greatest within the right lower lung, similar to baseline 04/07/2015 radiograph. Resolution of the prior superimposed patchy airspace opacities previously seen within the bilateral lungs on most recent 07/18/2022 radiograph. No pleural effusion pneumothorax. Moderate multilevel degenerative disc changes of the thoracic spine. Moderate chronic anterior T11 vertebral body height loss is unchanged from 02/26/2022 CT. IMPRESSION: 1. Moderate hyperinflation and chronic cystic emphysematous changes. 2. Chronic bilateral interstitial thickening is similar to baseline 04/07/2015 radiographs but decreased from most recent 07/18/2022 radiograph. 3. Resolution of the prior superimposed patchy airspace opacities previously seen within the bilateral lungs on most recent 07/18/2022 radiograph. Electronically Signed   By: Tanda Lyons  M.D.   On: 06/09/2023 08:37        Scheduled Meds:  enoxaparin  (LOVENOX ) injection  40 mg  Subcutaneous Q24H   gabapentin   300 mg Oral TID   guaiFENesin   600 mg Oral BID   methylPREDNISolone  (SOLU-MEDROL ) injection  40 mg Intravenous Daily   nicotine   14 mg Transdermal Daily   oseltamivir   75 mg Oral BID   pantoprazole   40 mg Oral Daily   sertraline   150 mg Oral Daily   Continuous Infusions:   LOS: 2 days        Anthony CHRISTELLA Pouch, MD [facility] Pager [phone]  If 7PM-7AM, please contact night-coverage [email or web address] 06/11/2023, 8:00 AM

## 2023-06-11 NOTE — Progress Notes (Signed)
 Patient noted to be 94% on RA while ambulating.

## 2023-06-11 NOTE — Plan of Care (Signed)
  Problem: Education: Goal: Knowledge of General Education information will improve Description: Including pain rating scale, medication(s)/side effects and non-pharmacologic comfort measures Outcome: Progressing   Problem: Health Behavior/Discharge Planning: Goal: Ability to manage health-related needs will improve Outcome: Progressing   Problem: Clinical Measurements: Goal: Ability to maintain clinical measurements within normal limits will improve Outcome: Progressing Goal: Will remain free from infection Outcome: Progressing Goal: Diagnostic test results will improve Outcome: Progressing Goal: Respiratory complications will improve Outcome: Progressing Goal: Cardiovascular complication will be avoided Outcome: Progressing   Problem: Clinical Measurements: Goal: Will remain free from infection Outcome: Progressing   Problem: Activity: Goal: Risk for activity intolerance will decrease Outcome: Progressing   Problem: Nutrition: Goal: Adequate nutrition will be maintained Outcome: Progressing   Problem: Coping: Goal: Level of anxiety will decrease Outcome: Progressing   Problem: Elimination: Goal: Will not experience complications related to bowel motility Outcome: Progressing Goal: Will not experience complications related to urinary retention Outcome: Progressing   

## 2023-06-12 DIAGNOSIS — J111 Influenza due to unidentified influenza virus with other respiratory manifestations: Secondary | ICD-10-CM | POA: Diagnosis not present

## 2023-06-12 LAB — CBC
HCT: 37.5 % (ref 36.0–46.0)
Hemoglobin: 11.9 g/dL — ABNORMAL LOW (ref 12.0–15.0)
MCH: 26.7 pg (ref 26.0–34.0)
MCHC: 31.7 g/dL (ref 30.0–36.0)
MCV: 84.1 fL (ref 80.0–100.0)
Platelets: 115 10*3/uL — ABNORMAL LOW (ref 150–400)
RBC: 4.46 MIL/uL (ref 3.87–5.11)
RDW: 13.5 % (ref 11.5–15.5)
WBC: 5.5 10*3/uL (ref 4.0–10.5)
nRBC: 0 % (ref 0.0–0.2)

## 2023-06-12 MED ORDER — OSELTAMIVIR PHOSPHATE 75 MG PO CAPS: 75.0000 mg | ORAL_CAPSULE | Freq: Two times a day (BID) | ORAL | 0 refills | Status: AC

## 2023-06-12 MED ORDER — PREDNISONE 20 MG PO TABS: 40.0000 mg | ORAL_TABLET | Freq: Every day | ORAL | 0 refills | Status: AC

## 2023-06-12 MED ORDER — PREDNISONE 20 MG PO TABS
40.0000 mg | ORAL_TABLET | Freq: Every day | ORAL | Status: DC
  Administered 2023-06-12: 40 mg via ORAL
  Filled 2023-06-12: qty 2

## 2023-06-12 NOTE — TOC Transition Note (Signed)
 Transition of Care [facility]) - Discharge Note   Patient Details  Name: Julia [name] MRN: [id number] Date of Birth: Jun 19, 1960  Transition of Care [facility]) CM/SW Contact:  Heron KATHEE Edison, RN Phone Number: 06/12/2023, 12:35 PM  Transition of Care [facility]) - Inpatient Brief Assessment/Transition Note  Clinical Narrative: 2/9: Discharge today to home/self care. Patient admitted on 2/6 from home with complaints of SOB with htx of asthma and COPD. Chronic oxygen  at home 2/5L/[place] at [facility]. Dx'd with COPD exacerbation & Influenza A while in [facility] ED.  No SDOH needs/alerts identified.  No TOC consult or HH/DME new orders.  To follow up with PCP 1-2 weeks.  PCP: Camelia Sherwood BIRCH, FNP [phone]   Bing Edison MSN RN CM  RN Case Manager [place]  Transitions of Care Direct Dial: [phone] (Weekends Only) [facility] Main Office Phone: [phone] [facility] Fax: [phone] [email or web address]    Transition of Care Asessment: Insurance and Status: Insurance coverage has been reviewed Patient has primary care physician: Yes Home environment has been reviewed: From home. Prior level of function:: DME oxygen  at HS at 2.5L/[place] Prior/Current Home Services:  (Home oxygen  at [facility] at 2.5L/) Social Drivers of Health Review: SDOH reviewed no interventions necessary Readmission risk has been reviewed: Yes Transition of care needs: no transition of care needs at this time   Final next level of care: Home/Self Care Barriers to Discharge: No Barriers Identified   Patient Goals and CMS Choice     Choice offered to / list presented to : NA      Discharge Placement                       Discharge Plan and Services Additional resources added to the After Visit Summary for                  DME Arranged: N/A DME Agency: NA       HH Arranged: NA HH Agency: NA        Social Drivers of Health (SDOH) Interventions SDOH Screenings   Food Insecurity: No Food Insecurity (06/10/2023)  Housing: Low  Risk  (06/10/2023)  Transportation Needs: No Transportation Needs (06/10/2023)  Utilities: Not At Risk (06/10/2023)  Financial Resource Strain: Low Risk  (03/17/2023)   Received from [facility]  Physical Activity: Inactive (03/31/2021)   Received from [facility]  Social Connections: Moderately Integrated (06/10/2023)  Tobacco Use: High Risk (06/10/2023)  Health Literacy: Low Risk  (08/16/2022)   Received from [facility]     Readmission Risk Interventions    02/28/2022    1:27 PM  Readmission Risk Prevention Plan  Transportation Screening Complete  PCP or Specialist Appt within 3-5 Days Complete  Social Work Consult for Recovery Care Planning/Counseling Complete  Palliative Care Screening Not Applicable  Medication Review [facility]) Complete

## 2023-06-12 NOTE — Discharge Summary (Signed)
 Physician Discharge Summary  Julia [name] FMW:[id number] DOB: 1960-06-23 DOA: 06/09/2023  PCP: Camelia Sherwood BIRCH, FNP  Admit date: 06/09/2023 Discharge date: 06/12/2023  Admitted From: home  Disposition:  home   Recommendations for Outpatient Follow-up:  Follow up with PCP in 1-2 weeks   Home Health: no  Equipment/Devices:  Discharge Condition: stable  CODE STATUS: full Diet recommendation: Regular  Brief/Interim Summary:  63 y/o F w/ PMH of COPD, smoker, depression, anxiety who presents w/ shortness of breath x 1 day. The shortness of breath is at rest as well as with exertion. Pt tried her inhalers and CPAP at home but her shortness of breath did not improve. Pt has sick contacts at home w/ 63 year old that similar symptoms. Pt c/o subjective fever. Pt denies any chills, sweating, chest pain, nausea, vomiting, abd pain, dysuria, urinary urgency, urinary frequency, diarrhea or constipation. Pt was found to have COPD exacerbation & influenza in the ER.   Discharge Diagnoses:  Principal Problem:   Influenza  Acute hypoxic respiratory failure: found to be 88% on RA. Likely secondary to COPD exacerbation & influenza. Weaned off of supplemental oxygen . Resolved    COPD exacerbation: likely secondary to influenza. Continue on steroids, bronchodilators & encourage incentive spirometry    Influenza: continue on tamiflu . Continue w/ supportive care. Droplet precautions    Smoker: received smoking cessation counseling x 5 mins. Nicotine  patch to prevent w/drawl   Thrombocytopenia: etiology unclear. Labile. Will continue to monitor     Depression: severity unknown. Continue on home dose of sertraline    GERD: continue on PPI   Discharge Instructions  Discharge Instructions     Diet general   Complete by: As directed    Discharge instructions   Complete by: As directed    F/u w/ PCP in 1-2 weeks   Increase activity slowly   Complete by: As directed       Allergies as of  06/12/2023       Reactions   Pollen Extract    Banana Nausea And Vomiting        Medication List     STOP taking these medications    amoxicillin-clavulanate 875-125 MG tablet Commonly known as: AUGMENTIN   esomeprazole 40 MG capsule Commonly known as: NEXIUM   ferrous sulfate  325 (65 FE) MG tablet   ipratropium-albuterol  0.5-2.5 (3) MG/3ML Soln Commonly known as: DUONEB   meloxicam 15 MG tablet Commonly known as: MOBIC   omeprazole  20 MG capsule Commonly known as: PRILOSEC       TAKE these medications    acetaminophen  500 MG tablet Commonly known as: TYLENOL  Take 1,000 mg by mouth every 6 (six) hours as needed.   albuterol  108 (90 Base) MCG/ACT inhaler Commonly known as: VENTOLIN  HFA Inhale 2 puffs into the lungs every 6 (six) hours as needed for wheezing or shortness of breath.   albuterol  (2.5 MG/3ML) 0.083% nebulizer solution Commonly known as: PROVENTIL  Take 3 mLs (2.5 mg total) by nebulization every 2 (two) hours as needed for wheezing or shortness of breath.   ALPRAZolam  0.5 MG tablet Commonly known as: XANAX  Take 0.5 mg by mouth 2 (two) times daily as needed.   docusate sodium  100 MG capsule Commonly known as: COLACE Take 100 mg by mouth daily as needed for mild constipation.   gabapentin  300 MG capsule Commonly known as: NEURONTIN  Take 300 mg by mouth 3 (three) times daily.   magnesium  oxide 400 MG tablet Commonly known as: MAG-OX Take 400 mg  by mouth daily.   oseltamivir  75 MG capsule Commonly known as: TAMIFLU  Take 1 capsule (75 mg total) by mouth 2 (two) times daily for 2 days.   pantoprazole  40 MG tablet Commonly known as: PROTONIX  Take 40 mg by mouth daily.   predniSONE  20 MG tablet Commonly known as: DELTASONE  Take 2 tablets (40 mg total) by mouth daily with breakfast for 5 days. Start taking on: June 13, 2023 What changed:  medication strength how much to take how to take this when to take this additional  instructions   sertraline  100 MG tablet Commonly known as: ZOLOFT  Take 150 mg by mouth daily.   Trelegy Ellipta  100-62.5-25 MCG/ACT Aepb Generic drug: Fluticasone -Umeclidin-Vilant Inhale 1 puff into the lungs daily.   varenicline  1 MG tablet Commonly known as: CHANTIX  Take 1 mg by mouth 2 (two) times daily.        Allergies  Allergen Reactions   Pollen Extract    Banana Nausea And Vomiting    Consultations:    Procedures/Studies: CT Angio Chest PE W/Cm &/Or Wo Cm Result Date: 06/09/2023 CLINICAL DATA:  63 year old female with shortness of breath. Sick contacts. Abnormal D-dimer. EXAM: CT ANGIOGRAPHY CHEST WITH CONTRAST TECHNIQUE: Multidetector CT imaging of the chest was performed using the standard protocol during bolus administration of intravenous contrast. Multiplanar CT image reconstructions and MIPs were obtained to evaluate the vascular anatomy. RADIATION DOSE REDUCTION: This exam was performed according to the departmental dose-optimization program which includes automated exposure control, adjustment of the mA and/or kV according to patient size and/or use of iterative reconstruction technique. CONTRAST:  75mL OMNIPAQUE  IOHEXOL  350 MG/ML SOLN COMPARISON:  Chest radiographs 0809 hours today. Prior chest CTA 09/23/2021. FINDINGS: Cardiovascular: Good contrast bolus timing in the pulmonary arterial tree. No pulmonary artery filling defect identified. Calcified aortic atherosclerosis. Calcified coronary artery atherosclerosis on series 6, image 244. Stable heart size, upper limits of normal. No pericardial effusion. Mediastinum/Nodes: Mediastinal lymph nodes have regressed since 2023, appear reactive. No mediastinal mass. Resolved gastric hiatal hernia since 2023, probably was surgically repaired. Lungs/Pleura: Intermittent respiratory motion. Major airways are patent with bilateral bronchial wall thickening. Underlying centrilobular emphysema redemonstrated. Chronic  postinflammatory nodularity in the left upper lobe is stable since 2023 and benign. Similar small left upper lobe lung nodules on series 5, image 50 and 55 are stable and benign (no follow-up imaging recommended). Similar chronic postinflammatory scarring in the superior segment of the left lower lobe is stable. Chronic probably distal mucoid impacted airways on series 5, image 105 appears stable with otherwise improved right lung base ventilation compared to the previous CT. And linear chronic posterior basal segment right lower lobe atelectasis or scarring is associated with a small chronic Bochdalek's hernia there. No pleural effusion. No convincing acute lung inflammation. Upper Abdomen: Negative visible essentially noncontrast liver, spleen, pancreas, adrenal glands, left kidney and stomach. Musculoskeletal: chronic T11 compression fracture is stable. Underlying scoliosis and spine degeneration. Occasional chronic rib fractures. No acute osseous abnormality identified. Review of the MIP images confirms the above findings. IMPRESSION: 1. Negative for acute pulmonary embolus. 2. Chronic lung disease with, Emphysema (ICD10-J43.9) and stable postinflammatory changes since 2023. Bilateral airway thickening which could be acute or chronic Bronchitis. No other acute lung inflammation. No pleural effusion. 3. Aortic Atherosclerosis (ICD10-I70.0). Repaired gastric hiatal hernia since 2023. Electronically Signed   By: VEAR Hurst M.D.   On: 06/09/2023 10:37   DG Chest 2 View Result Date: 06/09/2023 CLINICAL DATA:  Shortness of breath.  History  of asthma and COPD. EXAM: CHEST - 2 VIEW COMPARISON:  Chest radiographs 07/18/2022, 04/19/2022; CT chest 02/26/2022, chest radiographs 02/26/2022 and 09/28/2021 FINDINGS: Cardiac silhouette and mediastinal contours are within limits. There is again flattening of the diaphragms and moderate hyperinflation. Chronic bilateral interstitial thickening is similar to baseline 04/07/2015  radiographs but decreased from 07/18/2022. Cystic emphysematous changes are seen as also seen on 02/26/2022 CT. The interstitial thickening is again greatest within the right lower lung, similar to baseline 04/07/2015 radiograph. Resolution of the prior superimposed patchy airspace opacities previously seen within the bilateral lungs on most recent 07/18/2022 radiograph. No pleural effusion pneumothorax. Moderate multilevel degenerative disc changes of the thoracic spine. Moderate chronic anterior T11 vertebral body height loss is unchanged from 02/26/2022 CT. IMPRESSION: 1. Moderate hyperinflation and chronic cystic emphysematous changes. 2. Chronic bilateral interstitial thickening is similar to baseline 04/07/2015 radiographs but decreased from most recent 07/18/2022 radiograph. 3. Resolution of the prior superimposed patchy airspace opacities previously seen within the bilateral lungs on most recent 07/18/2022 radiograph. Electronically Signed   By: Tanda Lyons M.D.   On: 06/09/2023 08:37   (Echo, Carotid, EGD, Colonoscopy, ERCP)    Subjective: Pt c/o fatigue    Discharge Exam: Vitals:   06/12/23 0438 06/12/23 0936  BP: 109/66 114/67  Pulse: 65 79  Resp: 18 20  Temp: 98.1 F (36.7 C) 98 F (36.7 C)  SpO2: 99% 95%   Vitals:   06/11/23 1000 06/11/23 2025 06/12/23 0438 06/12/23 0936  BP:  120/77 109/66 114/67  Pulse:  79 65 79  Resp:  17 18 20   Temp:  98.1 F (36.7 C) 98.1 F (36.7 C) 98 F (36.7 C)  TempSrc:      SpO2: 94% 100% 99% 95%  Weight:      Height:        General: Pt is alert, awake, not in acute distress Cardiovascular: S1/S2 +, no rubs, no gallops Respiratory: decreased breath sounds b/l  Abdominal: Soft, NT, ND, bowel sounds + Extremities: no edema, no cyanosis    The results of significant diagnostics from this hospitalization (including imaging, microbiology, ancillary and laboratory) are listed below for reference.     Microbiology: Recent Results  (from the past 240 hours)  Resp panel by RT-PCR (RSV, Flu A&B, Covid) Anterior Nasal Swab     Status: Abnormal   Collection Time: 06/09/23  7:41 AM   Specimen: Anterior Nasal Swab  Result Value Ref Range Status   SARS Coronavirus 2 by RT PCR NEGATIVE NEGATIVE Final    Comment: (NOTE) SARS-CoV-2 target nucleic acids are NOT DETECTED.  The SARS-CoV-2 RNA is generally detectable in upper respiratory specimens during the acute phase of infection. The lowest concentration of SARS-CoV-2 viral copies this assay can detect is 138 copies/mL. A negative result does not preclude SARS-Cov-2 infection and should not be used as the sole basis for treatment or other patient management decisions. A negative result may occur with  improper specimen collection/handling, submission of specimen other than nasopharyngeal swab, presence of viral mutation(s) within the areas targeted by this assay, and inadequate number of viral copies(<138 copies/mL). A negative result must be combined with clinical observations, patient history, and epidemiological information. The expected result is Negative.  Fact Sheet for Patients:  bloggercourse.com  Fact Sheet for Healthcare Providers:  seriousbroker.it  This test is no t yet approved or cleared by the [place]  FDA and  has been authorized for detection and/or diagnosis of SARS-CoV-2 by FDA under an Emergency  Use Authorization (EUA). This EUA will remain  in effect (meaning this test can be used) for the duration of the COVID-19 declaration under Section 564(b)(1) of the Act, 21 U.S.C.section 360bbb-3(b)(1), unless the authorization is terminated  or revoked sooner.       Influenza A by PCR POSITIVE (A) NEGATIVE Final   Influenza B by PCR NEGATIVE NEGATIVE Final    Comment: (NOTE) The Xpert Xpress SARS-CoV-2/FLU/RSV plus assay is intended as an aid in the diagnosis of influenza from Nasopharyngeal swab  specimens and should not be used as a sole basis for treatment. Nasal washings and aspirates are unacceptable for Xpert Xpress SARS-CoV-2/FLU/RSV testing.  Fact Sheet for Patients: bloggercourse.com  Fact Sheet for Healthcare Providers: seriousbroker.it  This test is not yet approved or cleared by the [place]  FDA and has been authorized for detection and/or diagnosis of SARS-CoV-2 by FDA under an Emergency Use Authorization (EUA). This EUA will remain in effect (meaning this test can be used) for the duration of the COVID-19 declaration under Section 564(b)(1) of the Act, 21 U.S.C. section 360bbb-3(b)(1), unless the authorization is terminated or revoked.     Resp Syncytial Virus by PCR NEGATIVE NEGATIVE Final    Comment: (NOTE) Fact Sheet for Patients: bloggercourse.com  Fact Sheet for Healthcare Providers: seriousbroker.it  This test is not yet approved or cleared by the [place]  FDA and has been authorized for detection and/or diagnosis of SARS-CoV-2 by FDA under an Emergency Use Authorization (EUA). This EUA will remain in effect (meaning this test can be used) for the duration of the COVID-19 declaration under Section 564(b)(1) of the Act, 21 U.S.C. section 360bbb-3(b)(1), unless the authorization is terminated or revoked.  Performed at [facility], [address] [place], [place] [address]      Labs: BNP (last 3 results) Recent Labs    07/18/22 0411 06/09/23 0741  BNP 37.5 84.1   Basic Metabolic Panel: Recent Labs  Lab 06/09/23 0741  NA 135  K 3.7  CL 96*  CO2 28  GLUCOSE 95  BUN 12  CREATININE 0.49  CALCIUM 9.7   Liver Function Tests: Recent Labs  Lab 06/09/23 0741  AST 20  ALT 11  ALKPHOS 56  BILITOT <0.2  PROT 7.3  ALBUMIN 3.7   No results for input(s): LIPASE, AMYLASE in the last 168 hours. No results for  input(s): AMMONIA in the last 168 hours. CBC: Recent Labs  Lab 06/09/23 0741 06/10/23 0504 06/11/23 0519 06/12/23 0452  WBC 4.3 4.6 5.4 5.5  NEUTROABS 2.9  --   --   --   HGB 12.2 12.5 12.6 11.9*  HCT 38.8 40.8 40.7 37.5  MCV 83.1 85.4 84.3 84.1  PLT 116* 113* 100* 115*   Cardiac Enzymes: No results for input(s): CKTOTAL, CKMB, CKMBINDEX, TROPONINI in the last 168 hours. BNP: Invalid input(s): POCBNP CBG: No results for input(s): GLUCAP in the last 168 hours. D-Dimer No results for input(s): DDIMER in the last 72 hours.  Hgb A1c No results for input(s): HGBA1C in the last 72 hours. Lipid Profile No results for input(s): CHOL, HDL, LDLCALC, TRIG, CHOLHDL, LDLDIRECT in the last 72 hours. Thyroid function studies No results for input(s): TSH, T4TOTAL, T3FREE, THYROIDAB in the last 72 hours.  Invalid input(s): FREET3 Anemia work up No results for input(s): VITAMINB12, FOLATE, FERRITIN, TIBC, IRON , RETICCTPCT in the last 72 hours. Urinalysis    Component Value Date/Time   COLORURINE YELLOW (A) 09/23/2021 1424   APPEARANCEUR CLEAR (A) 09/23/2021  1424   LABSPEC 1.023 09/23/2021 1424   PHURINE 5.0 09/23/2021 1424   GLUCOSEU 50 (A) 09/23/2021 1424   HGBUR NEGATIVE 09/23/2021 1424   BILIRUBINUR NEGATIVE 09/23/2021 1424   KETONESUR NEGATIVE 09/23/2021 1424   PROTEINUR NEGATIVE 09/23/2021 1424   NITRITE NEGATIVE 09/23/2021 1424   LEUKOCYTESUR NEGATIVE 09/23/2021 1424   Sepsis Labs Recent Labs  Lab 06/09/23 0741 06/10/23 0504 06/11/23 0519 06/12/23 0452  WBC 4.3 4.6 5.4 5.5   Microbiology Recent Results (from the past 240 hours)  Resp panel by RT-PCR (RSV, Flu A&B, Covid) Anterior Nasal Swab     Status: Abnormal   Collection Time: 06/09/23  7:41 AM   Specimen: Anterior Nasal Swab  Result Value Ref Range Status   SARS Coronavirus 2 by RT PCR NEGATIVE NEGATIVE Final    Comment: (NOTE) SARS-CoV-2 target nucleic  acids are NOT DETECTED.  The SARS-CoV-2 RNA is generally detectable in upper respiratory specimens during the acute phase of infection. The lowest concentration of SARS-CoV-2 viral copies this assay can detect is 138 copies/mL. A negative result does not preclude SARS-Cov-2 infection and should not be used as the sole basis for treatment or other patient management decisions. A negative result may occur with  improper specimen collection/handling, submission of specimen other than nasopharyngeal swab, presence of viral mutation(s) within the areas targeted by this assay, and inadequate number of viral copies(<138 copies/mL). A negative result must be combined with clinical observations, patient history, and epidemiological information. The expected result is Negative.  Fact Sheet for Patients:  bloggercourse.com  Fact Sheet for Healthcare Providers:  seriousbroker.it  This test is no t yet approved or cleared by the [place]  FDA and  has been authorized for detection and/or diagnosis of SARS-CoV-2 by FDA under an Emergency Use Authorization (EUA). This EUA will remain  in effect (meaning this test can be used) for the duration of the COVID-19 declaration under Section 564(b)(1) of the Act, 21 U.S.C.section 360bbb-3(b)(1), unless the authorization is terminated  or revoked sooner.       Influenza A by PCR POSITIVE (A) NEGATIVE Final   Influenza B by PCR NEGATIVE NEGATIVE Final    Comment: (NOTE) The Xpert Xpress SARS-CoV-2/FLU/RSV plus assay is intended as an aid in the diagnosis of influenza from Nasopharyngeal swab specimens and should not be used as a sole basis for treatment. Nasal washings and aspirates are unacceptable for Xpert Xpress SARS-CoV-2/FLU/RSV testing.  Fact Sheet for Patients: bloggercourse.com  Fact Sheet for Healthcare  Providers: seriousbroker.it  This test is not yet approved or cleared by the [place]  FDA and has been authorized for detection and/or diagnosis of SARS-CoV-2 by FDA under an Emergency Use Authorization (EUA). This EUA will remain in effect (meaning this test can be used) for the duration of the COVID-19 declaration under Section 564(b)(1) of the Act, 21 U.S.C. section 360bbb-3(b)(1), unless the authorization is terminated or revoked.     Resp Syncytial Virus by PCR NEGATIVE NEGATIVE Final    Comment: (NOTE) Fact Sheet for Patients: bloggercourse.com  Fact Sheet for Healthcare Providers: seriousbroker.it  This test is not yet approved or cleared by the [place]  FDA and has been authorized for detection and/or diagnosis of SARS-CoV-2 by FDA under an Emergency Use Authorization (EUA). This EUA will remain in effect (meaning this test can be used) for the duration of the COVID-19 declaration under Section 564(b)(1) of the Act, 21 U.S.C. section 360bbb-3(b)(1), unless the authorization is terminated or revoked.  Performed at [facility], [address] [place], [place] [address]      Time coordinating discharge: Over 30 minutes  SIGNED:   Anthony CHRISTELLA Pouch, MD  [facility] 06/12/2023, 11:50 AM Pager   If 7PM-7AM, please contact night-coverage [email or web address]

## 2023-11-22 ENCOUNTER — Ambulatory Visit (INDEPENDENT_AMBULATORY_CARE_PROVIDER_SITE_OTHER): Admitting: Pediatrics

## 2023-11-22 ENCOUNTER — Encounter: Payer: Self-pay | Admitting: Pediatrics

## 2023-11-22 VITALS — BP 99/63 | HR 48 | Temp 97.8°F | Ht 63.0 in | Wt 109.2 lb

## 2023-11-22 DIAGNOSIS — E611 Iron deficiency: Secondary | ICD-10-CM

## 2023-11-22 DIAGNOSIS — R195 Other fecal abnormalities: Secondary | ICD-10-CM

## 2023-11-22 DIAGNOSIS — J449 Chronic obstructive pulmonary disease, unspecified: Secondary | ICD-10-CM | POA: Diagnosis not present

## 2023-11-22 DIAGNOSIS — Z131 Encounter for screening for diabetes mellitus: Secondary | ICD-10-CM

## 2023-11-22 DIAGNOSIS — Z7689 Persons encountering health services in other specified circumstances: Secondary | ICD-10-CM

## 2023-11-22 DIAGNOSIS — M5412 Radiculopathy, cervical region: Secondary | ICD-10-CM

## 2023-11-22 DIAGNOSIS — Z13220 Encounter for screening for lipoid disorders: Secondary | ICD-10-CM

## 2023-11-22 DIAGNOSIS — K219 Gastro-esophageal reflux disease without esophagitis: Secondary | ICD-10-CM

## 2023-11-22 DIAGNOSIS — Z1330 Encounter for screening examination for mental health and behavioral disorders, unspecified: Secondary | ICD-10-CM | POA: Diagnosis not present

## 2023-11-22 DIAGNOSIS — Z1231 Encounter for screening mammogram for malignant neoplasm of breast: Secondary | ICD-10-CM

## 2023-11-22 DIAGNOSIS — F411 Generalized anxiety disorder: Secondary | ICD-10-CM

## 2023-11-22 DIAGNOSIS — D214 Benign neoplasm of connective and other soft tissue of abdomen: Secondary | ICD-10-CM

## 2023-11-22 DIAGNOSIS — F17209 Nicotine dependence, unspecified, with unspecified nicotine-induced disorders: Secondary | ICD-10-CM | POA: Diagnosis not present

## 2023-11-22 MED ORDER — TRELEGY ELLIPTA 100-62.5-25 MCG/ACT IN AEPB: 1.0000 | INHALATION_SPRAY | Freq: Every day | RESPIRATORY_TRACT | 1 refills | Status: DC

## 2023-11-22 MED ORDER — ALBUTEROL SULFATE (2.5 MG/3ML) 0.083% IN NEBU: 2.5000 mg | INHALATION_SOLUTION | RESPIRATORY_TRACT | 0 refills | Status: DC | PRN

## 2023-11-22 MED ORDER — ALPRAZOLAM 0.5 MG PO TABS
0.5000 mg | ORAL_TABLET | Freq: Every day | ORAL | 2 refills | Status: DC | PRN
Start: 2023-11-22 — End: 2024-02-29

## 2023-11-22 MED ORDER — SERTRALINE HCL 100 MG PO TABS: 150.0000 mg | ORAL_TABLET | Freq: Every day | ORAL | 3 refills | Status: DC

## 2023-11-22 MED ORDER — OMEPRAZOLE 20 MG PO CPDR: 20.0000 mg | DELAYED_RELEASE_CAPSULE | Freq: Every day | ORAL | 3 refills | Status: AC | PRN

## 2023-11-22 MED ORDER — ALBUTEROL SULFATE HFA 108 (90 BASE) MCG/ACT IN AERS: 2.0000 | INHALATION_SPRAY | Freq: Four times a day (QID) | RESPIRATORY_TRACT | 2 refills | Status: DC | PRN

## 2023-11-22 NOTE — Patient Instructions (Addendum)
 Good to meet you! Welcome to [facility]!  As your primary care doctor, I look forward to working with you to help you reach your health goals.  Please be aware of a couple of logistical items: - If you message me on mychart, it may take me 1-2 business days to get back to you. This is for non-urgent messaging.  - If you require urgent clinical attention, please call the clinic or present to urgent care/emergency room - If you have labs, I typically will send a message about them in 1-2 business days. - I am not here on Mondays, otherwise will be available from Tuesday-Friday during 8a-5pm.   You have an order for:  []   2D Mammogram  [x]   3D Mammogram  []   Bone Density     Please call for appointment:  [facility]  [address] [place] [address] [phone] [facility] [address] [place], [place] [address] [phone] [facility] [address] [place], [place] [address] [phone]   Make sure to wear two-piece clothing.  No lotions, powders, or deodorants the day of the appointment. Make sure to bring picture ID and insurance card.  Bring list of medications you are currently taking including any supplements.   Schedule your [place] screening mammogram through MyChart!   Log into your MyChart account.  Go to 'Visit' (or 'Appointments' if on mobile App) --> Schedule an Appointment  Under 'Select a Reason for Visit' choose the Mammogram Screening option.  Complete the pre-visit questions and select the time and place that best fits your schedule.

## 2023-11-22 NOTE — Assessment & Plan Note (Signed)
 Follows with surgery, recommend yearly CT follow up for monitoring. CTM.

## 2023-11-22 NOTE — Progress Notes (Signed)
 Establish Care Note  BP 99/63   Pulse (!) 48   Temp 97.8 F (36.6 C) (Oral)   Ht 5' 3 (1.6 m)   Wt 109 lb 3.2 oz (49.5 kg)   SpO2 97%   BMI 19.34 kg/m    Subjective:    Patient ID: [name], female    DOB: [birth year], 63 y.o.   MRN: [id number]  HPI: KYLI SORTER is a 63 y.o. female  Chief Complaint  Patient presents with   Establish Care    Establishing care, the following was discussed today:  Discussed the use of AI scribe software for clinical note transcription with the patient, who gave verbal consent to proceed.  History of Present Illness   Damari S Hjort is a 63 year old female with COPD who presents for medication management and follow-up.  She has a history of COPD exacerbation earlier this year, although she does not recall the event. Her breathing has been stable, even in hot and humid weather. She has a CPAP machine at home but has not used it in a few months. She has oxygen  at home and uses a nebulizer treatment in the morning while multitasking. She takes Trelegy, which is about to run out, and albuterol  as needed.  She experiences low pulse rates but does not feel lightheaded. She has a history of black stools, which she attributes to iron  supplements. Recently, she had an episode of diarrhea and soiling herself, which she associates with her hernia surgery in November. She takes iron  supplements and has started using fiber gummies to manage bowel movements. She experiences diarrhea if she eats too much and constipation if she eats too little. She has noticed her stools being blacker than usual and is concerned about this change.  She takes Xanax , usually once daily, but it is prescribed twice daily. She prefers to take it once daily and is comfortable with the current dosage. She also takes sertraline , iron  supplements, magnesium , and omeprazole . She uses gabapentin  occasionally for arthritis pain in her neck, shoulders, and spine.  She lives  with her husband, who is 39, and her three grandsons aged 43, 8, and 67. She has been dealing with bedbugs and rats at home, which has caused her significant stress and contributed to her PTSD. She smokes about half a pack of cigarettes a day and has previously tried Chantix  but is not currently using it.        Current Outpatient Medications on File Prior to Visit  Medication Sig Dispense Refill   acetaminophen  (TYLENOL ) 500 MG tablet Take 1,000 mg by mouth every 6 (six) hours as needed.     docusate sodium  (COLACE) 100 MG capsule Take 100 mg by mouth daily as needed for mild constipation.     gabapentin  (NEURONTIN ) 300 MG capsule Take 300 mg by mouth 3 (three) times daily.     magnesium  oxide (MAG-OX) 400 MG tablet Take 400 mg by mouth daily.     pantoprazole  (PROTONIX ) 40 MG tablet Take 40 mg by mouth daily.     varenicline  (CHANTIX ) 1 MG tablet Take 1 mg by mouth 2 (two) times daily. (Patient not taking: Reported on 11/22/2023)     No current facility-administered medications on file prior to visit.    #HM Will review HM records and updated as needed.  Relevant past medical, surgical, family and social history reviewed and updated as indicated. Interim medical history since our last visit reviewed. Allergies and medications reviewed and  updated.  ROS per HPI unless specifically indicated above     Objective:    BP 99/63   Pulse (!) 48   Temp 97.8 F (36.6 C) (Oral)   Ht 5' 3 (1.6 m)   Wt 109 lb 3.2 oz (49.5 kg)   SpO2 97%   BMI 19.34 kg/m   Wt Readings from Last 3 Encounters:  11/22/23 109 lb 3.2 oz (49.5 kg)  06/10/23 111 lb 11.2 oz (50.7 kg)  07/18/22 122 lb 12.7 oz (55.7 kg)     Physical Exam Constitutional:      Appearance: Normal appearance.  Cardiovascular:     Rate and Rhythm: Normal rate and regular rhythm.     Pulses: Normal pulses.     Heart sounds: Normal heart sounds.  Pulmonary:     Effort: Pulmonary effort is normal.     Breath sounds: Normal  breath sounds.  Musculoskeletal:        General: Normal range of motion.  Skin:    Comments: Normal skin color  Neurological:     General: No focal deficit present.     Mental Status: She is alert. Mental status is at baseline.  Psychiatric:        Mood and Affect: Mood normal.        Behavior: Behavior normal.        Thought Content: Thought content normal.         11/22/2023    1:33 PM  Depression screen PHQ 2/9  Decreased Interest 1  Down, Depressed, Hopeless 1  PHQ - 2 Score 2  Altered sleeping 1  Tired, decreased energy 1  Change in appetite 1  Feeling bad or failure about yourself  1  Trouble concentrating 1  Moving slowly or fidgety/restless 0  Suicidal thoughts 0  PHQ-9 Score 7  Difficult doing work/chores Not difficult at all        11/22/2023    1:34 PM  GAD 7 : Generalized Anxiety Score  Nervous, Anxious, on Edge 0  Control/stop worrying 1  Worry too much - different things 1  Trouble relaxing 1  Restless 0  Easily annoyed or irritable 1  Afraid - awful might happen 1  Total GAD 7 Score 5  Anxiety Difficulty Not difficult at all       Assessment & Plan:  Assessment & Plan   Chronic obstructive pulmonary disease, unspecified COPD type (HCC) Assessment & Plan: Breathing is well-managed, but her Trelegy prescription was denied and needs a refill. Refill prescriptions for the Trelegy inhaler, albuterol  inhaler, and nebulizer solution. Encourage follow-up with her pulmonologist.  Orders: -     Trelegy Ellipta ; Inhale 1 puff into the lungs daily.  Dispense: 28 each; Refill: 1 -     Albuterol  Sulfate; Take 3 mLs (2.5 mg total) by nebulization every 2 (two) hours as needed for wheezing or shortness of breath.  Dispense: 90 mL; Refill: 0 -     Albuterol  Sulfate HFA; Inhale 2 puffs into the lungs every 6 (six) hours as needed for wheezing or shortness of breath.  Dispense: 1 each; Refill: 2  Nicotine  dependence with nicotine -induced disorder, unspecified  nicotine  product type Assessment & Plan: Discussed cessation and available treatment including nicotine  replacement options, pharmacologic treatment, and/or online resources. Based on our discussion, she does plan to initiate treatment today. Total time spent on discussion: 3 minutes.    Cervical radiculopathy Assessment & Plan: Pain is managed with topical rubs and occasional gabapentin . Current management  is satisfactory. Send a prescription for gabapentin  for as-needed use.   GAD (generalized anxiety disorder) Assessment & Plan: She takes Xanax  once daily but prefers as-needed use. Discussed reducing the prescription. Update Xanax  prescription to once daily, 60 tablets per month. Discussed benzo precautions and risks for adverse events in older adults. She understands risks and wants to continue his current regimen vs work towards a taper. Will need to adhere to 3 month follow ups and yearly UDS.   Orders: -     ALPRAZolam ; Take 1-2 tablets (0.5-1 mg total) by mouth daily as needed.  Dispense: 60 tablet; Refill: 2 -     Sertraline  HCl; Take 1.5 tablets (150 mg total) by mouth daily.  Dispense: 135 tablet; Refill: 3  GIST (gastrointestinal stromal tumor), non-malignant Assessment & Plan: Follows with surgery, recommend yearly CT follow up for monitoring. CTM.   Iron  deficiency Dark stools Assessment & Plan: She reports black stools, possibly from iron  supplementation. A stool test is agreed upon to rule out GI bleeding. Order a stool test for occult blood and check iron  levels to assess the effectiveness of supplementation.  Orders: -     Iron , TIBC and Ferritin Panel -     Fecal occult blood, imunochemical -     CBC with Differential/Platelet  Gastroesophageal reflux disease, unspecified whether esophagitis present Assessment & Plan: Symptoms persist post-surgery (bariatrics), but omeprazole  is effective after trigger foods. Send a prescription for omeprazole .  Orders: -      Comprehensive metabolic panel with GFR -     Omeprazole ; Take 1 capsule (20 mg total) by mouth daily as needed.  Dispense: 180 capsule; Refill: 3  Diabetes mellitus screening -     Hemoglobin A1c  Encounter for screening mammogram for malignant neoplasm of breast -     3D Screening Mammogram, Left and Right; Future  Encounter for establish care Reviewed available patient record including history, medications, problem list. HM updated as able. Will review and/or request outside records (if applicable) and will fill remaining HM gaps as needed at follow up visit.  Encounter for behavioral health screening As part of their intake evaluation, the patient was screened for depression, anxiety.  PHQ9 SCORE 7, GAD7 SCORE 5. Screening results positive for tested conditions. See plan under problem/diagnosis above.    Follow up plan: Return in about 3 months (around 02/22/2024) for Chronic illness f/u.  Hadassah SHAUNNA Nett, MD

## 2023-11-23 ENCOUNTER — Ambulatory Visit: Payer: Self-pay | Admitting: Pediatrics

## 2023-11-23 LAB — CBC WITH DIFFERENTIAL/PLATELET
Basophils Absolute: 0.1 x10E3/uL (ref 0.0–0.2)
Basos: 1 %
EOS (ABSOLUTE): 0.2 x10E3/uL (ref 0.0–0.4)
Eos: 3 %
Hematocrit: 42.9 % (ref 34.0–46.6)
Hemoglobin: 13.5 g/dL (ref 11.1–15.9)
Immature Grans (Abs): 0 x10E3/uL (ref 0.0–0.1)
Immature Granulocytes: 0 %
Lymphocytes Absolute: 1.8 x10E3/uL (ref 0.7–3.1)
Lymphs: 25 %
MCH: 28 pg (ref 26.6–33.0)
MCHC: 31.5 g/dL (ref 31.5–35.7)
MCV: 89 fL (ref 79–97)
Monocytes Absolute: 0.4 x10E3/uL (ref 0.1–0.9)
Monocytes: 6 %
Neutrophils Absolute: 4.8 x10E3/uL (ref 1.4–7.0)
Neutrophils: 65 %
Platelets: 170 x10E3/uL (ref 150–450)
RBC: 4.82 x10E6/uL (ref 3.77–5.28)
RDW: 14 % (ref 11.7–15.4)
WBC: 7.4 x10E3/uL (ref 3.4–10.8)

## 2023-11-23 LAB — HEMOGLOBIN A1C
Est. average glucose Bld gHb Est-mCnc: 126 mg/dL
Hgb A1c MFr Bld: 6 % — ABNORMAL HIGH (ref 4.8–5.6)

## 2023-11-23 LAB — IRON,TIBC AND FERRITIN PANEL
Ferritin: 70 ng/mL (ref 15–150)
Iron Saturation: 20 % (ref 15–55)
Iron: 68 ug/dL (ref 27–139)
Total Iron Binding Capacity: 338 ug/dL (ref 250–450)
UIBC: 270 ug/dL (ref 118–369)

## 2023-11-23 LAB — COMPREHENSIVE METABOLIC PANEL WITH GFR
ALT: 14 IU/L (ref 0–32)
AST: 23 IU/L (ref 0–40)
Albumin: 4.5 g/dL (ref 3.9–4.9)
Alkaline Phosphatase: 66 IU/L (ref 44–121)
BUN/Creatinine Ratio: 20 (ref 12–28)
BUN: 14 mg/dL (ref 8–27)
Bilirubin Total: 0.2 mg/dL (ref 0.0–1.2)
CO2: 23 mmol/L (ref 20–29)
Calcium: 10.3 mg/dL (ref 8.7–10.3)
Chloride: 98 mmol/L (ref 96–106)
Creatinine, Ser: 0.69 mg/dL (ref 0.57–1.00)
Globulin, Total: 2.6 g/dL (ref 1.5–4.5)
Glucose: 77 mg/dL (ref 70–99)
Potassium: 4.8 mmol/L (ref 3.5–5.2)
Sodium: 140 mmol/L (ref 134–144)
Total Protein: 7.1 g/dL (ref 6.0–8.5)
eGFR: 97 mL/min/1.73 (ref >59)

## 2023-11-24 LAB — FECAL OCCULT BLOOD, IMMUNOCHEMICAL: Fecal Occult Bld: NEGATIVE

## 2023-11-27 ENCOUNTER — Encounter: Payer: Self-pay | Admitting: Pediatrics

## 2023-11-27 DIAGNOSIS — E611 Iron deficiency: Secondary | ICD-10-CM | POA: Insufficient documentation

## 2023-11-27 NOTE — Assessment & Plan Note (Signed)
 Symptoms persist post-surgery (bariatrics), but omeprazole  is effective after trigger foods. Send a prescription for omeprazole .

## 2023-11-27 NOTE — Assessment & Plan Note (Signed)
 She reports black stools, possibly from iron  supplementation. A stool test is agreed upon to rule out GI bleeding. Order a stool test for occult blood and check iron  levels to assess the effectiveness of supplementation.

## 2023-11-27 NOTE — Assessment & Plan Note (Signed)
 Breathing is well-managed, but her Trelegy prescription was denied and needs a refill. Refill prescriptions for the Trelegy inhaler, albuterol  inhaler, and nebulizer solution. Encourage follow-up with her pulmonologist.

## 2023-11-27 NOTE — Assessment & Plan Note (Signed)
 Discussed cessation and available treatment including nicotine  replacement options, pharmacologic treatment, and/or online resources. Based on our discussion, she does plan to initiate treatment today. Total time spent on discussion: 3 minutes.

## 2023-11-27 NOTE — Assessment & Plan Note (Signed)
 Pain is managed with topical rubs and occasional gabapentin . Current management is satisfactory. Send a prescription for gabapentin  for as-needed use.

## 2023-11-27 NOTE — Assessment & Plan Note (Addendum)
 She takes Xanax  once daily but prefers as-needed use. Discussed reducing the prescription. Update Xanax  prescription to once daily, 60 tablets per month. Discussed benzo precautions and risks for adverse events in older adults. She understands risks and wants to continue his current regimen vs work towards a taper. Will need to adhere to 3 month follow ups and yearly UDS.

## 2024-01-11 ENCOUNTER — Telehealth: Payer: Self-pay | Admitting: Pediatrics

## 2024-01-11 NOTE — Telephone Encounter (Signed)
 Copied from CRM [id number]. Topic: General - Other >> Jan 09, 2024  4:32 PM Wess RAMAN wrote: Reason for CRM: Patient stated she received a message stating GLP-1 prescription was placed for her. She believes that is a mistake or scam. She would like clarification  Callback #: [id number]

## 2024-01-12 ENCOUNTER — Telehealth: Payer: Self-pay

## 2024-01-12 NOTE — Telephone Encounter (Signed)
 Copied from CRM #[id number]. Topic: Clinical - Order For Equipment >> Jan 12, 2024  1:41 PM Myrick T wrote: Reason for CRM: Lorn with [facility] [phone] ext [phone] is requesting chart notes for patient to get oxygen  and ventilator. Lorn said this has been discussed with patient who says she is willing to come in for another appt

## 2024-01-16 NOTE — Telephone Encounter (Signed)
 Called and notified patient.

## 2024-01-18 NOTE — Telephone Encounter (Signed)
**Note De-identified  [place] Obfuscation** Please advise 

## 2024-01-20 NOTE — Telephone Encounter (Signed)
 Information faxed

## 2024-02-06 ENCOUNTER — Telehealth: Payer: Self-pay

## 2024-02-06 NOTE — Telephone Encounter (Unsigned)
 Copied from CRM [id number]. Topic: Clinical - Prescription Issue >> Feb 06, 2024  1:17 PM Teressa P wrote: Reason for CRM: Mindy with [facility] called saying they fax an order over for Vent RX and Ox RX and office visit notes , most recent as long as it mentions vent use and ox use.  She said they recd the Ox but it said 2026.  CB# [phone]

## 2024-02-08 ENCOUNTER — Other Ambulatory Visit: Payer: Self-pay | Admitting: Pediatrics

## 2024-02-08 DIAGNOSIS — J449 Chronic obstructive pulmonary disease, unspecified: Secondary | ICD-10-CM

## 2024-02-08 NOTE — Telephone Encounter (Signed)
 Orders received via fax. Placed in providers folder for completion and signature.

## 2024-02-10 NOTE — Telephone Encounter (Signed)
 Requested medication (s) are due for refill today: yes  Requested medication (s) are on the active medication list: yes  Last refill:  11/22/23  Future visit scheduled: yes  Notes to clinic:   Medication not assigned to a protocol, review manually.      Requested Prescriptions  Pending Prescriptions Disp Refills   TRELEGY ELLIPTA  100-62.5-25 MCG/ACT AEPB [Pharmacy Med Name: TRELEGY ELLIPTA  100-62.5-25] 60 each 0    Sig: Inhale 1 puff into the lungs daily.     Off-Protocol Failed - 02/10/2024  8:59 AM      Failed - Medication not assigned to a protocol, review manually.      Passed - Valid encounter within last 12 months    Recent Outpatient Visits           2 months ago Chronic obstructive pulmonary disease, unspecified COPD type [facility])   [facility] Hadassah SQUIBB, MD

## 2024-02-20 NOTE — Telephone Encounter (Addendum)
 Mindy Ph [phone] ext [phone]  Fax # [phone]    As per [facility] seeking to receive authorization through patient insurance and the chart notes received from PCP have no mention of ventilator and oxygen . The chart notes do reflect that patient needs a pulmonologist and will follow up with the specialist as well for additional notes.  Caller states she made an error on initial orders and will be faxing over new orders to PCP.  Caller states original orders the testing information reflected at rest instead of oxygen  , please notate when new orders are received and if PCP has office notes reflecting ventilator and oxygen  if so please fax new orders and notes to [facility] fax # [phone].

## 2024-02-22 ENCOUNTER — Ambulatory Visit: Admitting: Pediatrics

## 2024-02-24 NOTE — Telephone Encounter (Signed)
 Paperwork has been signed and faxed.

## 2024-02-29 ENCOUNTER — Ambulatory Visit

## 2024-02-29 VITALS — BP 104/80 | HR 73 | Ht 63.0 in | Wt 112.6 lb

## 2024-02-29 DIAGNOSIS — I2510 Atherosclerotic heart disease of native coronary artery without angina pectoris: Secondary | ICD-10-CM

## 2024-02-29 DIAGNOSIS — F5101 Primary insomnia: Secondary | ICD-10-CM

## 2024-02-29 DIAGNOSIS — J449 Chronic obstructive pulmonary disease, unspecified: Secondary | ICD-10-CM | POA: Diagnosis not present

## 2024-02-29 DIAGNOSIS — F17209 Nicotine dependence, unspecified, with unspecified nicotine-induced disorders: Secondary | ICD-10-CM | POA: Diagnosis not present

## 2024-02-29 DIAGNOSIS — D214 Benign neoplasm of connective and other soft tissue of abdomen: Secondary | ICD-10-CM | POA: Diagnosis not present

## 2024-02-29 DIAGNOSIS — K219 Gastro-esophageal reflux disease without esophagitis: Secondary | ICD-10-CM

## 2024-02-29 DIAGNOSIS — F1320 Sedative, hypnotic or anxiolytic dependence, uncomplicated: Secondary | ICD-10-CM | POA: Insufficient documentation

## 2024-02-29 DIAGNOSIS — Z23 Encounter for immunization: Secondary | ICD-10-CM

## 2024-02-29 DIAGNOSIS — F411 Generalized anxiety disorder: Secondary | ICD-10-CM

## 2024-02-29 MED ORDER — ALPRAZOLAM 0.5 MG PO TABS: 0.2500 mg | ORAL_TABLET | Freq: Two times a day (BID) | ORAL | 2 refills | Status: AC | PRN

## 2024-02-29 MED ORDER — ALBUTEROL SULFATE (2.5 MG/3ML) 0.083% IN NEBU
2.5000 mg | INHALATION_SOLUTION | RESPIRATORY_TRACT | 0 refills | Status: AC | PRN
Start: 2024-02-29 — End: ?

## 2024-02-29 MED ORDER — TRELEGY ELLIPTA 100-62.5-25 MCG/ACT IN AEPB: 1.0000 | INHALATION_SPRAY | Freq: Every day | RESPIRATORY_TRACT | 2 refills | Status: AC

## 2024-02-29 MED ORDER — GABAPENTIN 300 MG PO CAPS: 300.0000 mg | ORAL_CAPSULE | Freq: Every day | ORAL | 1 refills | Status: AC

## 2024-02-29 NOTE — Progress Notes (Signed)
 New Patient Visit   Physician: Terrell Shimko A Nolie Bignell, MD  Patient: Julia [name]   DOB: 09/03/1960   63 y.o. Female  MRN: [id number] Visit Date: 02/29/2024   Chief Complaint  Patient presents with   Establish Care   Subjective  Julia [name] is a 63 y.o. female who presents today as a new patient to establish care.   HPI  Discussed the use of AI scribe software for clinical note transcription with the patient, who gave verbal consent to proceed.  History of Present Illness   Julia [name] is a 63 year old female with COPD who presents for a new patient consultation and management of multiple chronic conditions.  Dyspnea and chronic obstructive pulmonary disease (copd) symptoms - COPD with no hospitalizations for exacerbations in over a year - Uses Trelegy daily and albuterol  as a rescue inhaler approximately twice daily - Uses a nebulizer, primarily in the morning - Current smoker, smoking about half a pack of cigarettes daily for fifty years - Shortness of breath on exertion, attributed to COPD  Anxiety and depressive symptoms - Anxiety and depression managed with Xanax  0.5 mg once daily and sertraline  100 mg daily - On Xanax  for six years, initially prescribed by a doctor at [facility] - Reduced Xanax  intake from twice daily to once daily, occasionally cuts dose in half  - Tried clonazepam which she found sedating  Gastroesophageal reflux symptoms - Currently uses omeprazole  as needed - Improvement in symptoms following hernia surgery  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 - Tumor resected - Under surveillance with repeat CT scan scheduled for March 2026  Chronic back and neck pain - Chronic lower back pain and neck pain - Occasionally uses gabapentin  for pain relief - Scheduled to visit a pain clinic - Has tried exercises for symptom relief  Sleep disturbances -  Frequent awakenings after a few hours of sleep - Sometimes uses melatonin to aid sleep - Occasionally takes Xanax  at bedtime  Hyperglycemia - History of elevated blood sugar levels - Hemoglobin A1c of 6.0 in July - Reduced intake of candy bars and ice cream to manage blood sugar levels       ASSESSMENT & PLAN  Encounter Diagnoses  Name Primary?   Nicotine  dependence with nicotine -induced disorder, unspecified nicotine  product type Yes   GIST (gastrointestinal stromal tumor), non-malignant    Gastroesophageal reflux disease without esophagitis    Chronic obstructive pulmonary disease, unspecified COPD type (HCC)    GAD (generalized anxiety disorder)    Primary insomnia    Benzodiazepine dependence (HCC)    Coronary artery disease involving native heart without angina pectoris, unspecified vessel or lesion type     Orders Placed This Encounter  Procedures   DG Bone Density   CT CARDIAC SCORING (SELF PAY ONLY)    Assessment and Plan    Adult Wellness  Routine wellness visit focused on general health and lifestyle. - Encourage daily walking to improve cardiovascular health and lung function.  - BD given habitus and lengthy smoking history  Chronic obstructive pulmonary disease (COPD) Well-managed COPD with no hospitalizations in over a year. Active lifestyle contributes to frequent use of albuterol  rescue inhaler. - Continue Trelegy daily. - Use albuterol  rescue inhaler as needed. - Use nebulizer twice daily as recommended by pulmonologist. - Follow up with pulmonologist in January.  Nicotine  dependence (current smoker) Current smoker with a 50-year history,  smoking half a pack per day. Discussed risks including coronary artery disease and COPD exacerbation.   Generalized anxiety disorder and depression Anxiety and depression managed with sertraline  and alprazolam . Discussed risks of long-term benzodiazepine use, including dementia and falls.  - Reduce alprazolam  dose to  0.25 mg BID or QD if possible. - Consider switching to other longer acting benzodiazepine - diazepam or lorazepam with slow wean  - Goal to d/c benzodiazepines if possible over the long run with long wean.  She is aware of and agrees that this will be the plan if she will be my patient - Continue sertraline  100 mg daily.  Chronic low back and neck pain Chronic pain managed with gabapentin  as needed. Referred to pain clinic for further management. - Continue gabapentin  as needed. Do not use with xanax  - Follow up with pain clinic for further management.  Gastroesophageal reflux disease (GERD) GERD symptoms improved post-hernia surgery. Previously on pantoprazole , now using omeprazole  as needed. - Continue omeprazole  as needed.  Benign neoplasm of gastrointestinal stromal tissue, post-resection, under surveillance Gastric GI stromal tumor resected earlier this year, under surveillance. - Schedule repeat CT scan in March 2026.  Coronary artery disease (coronary plaque) - Seen on CT from the last yr Coronary artery disease with plaque. Discussed importance of assessing plaque burden due to smoking history. - Order coronary calcium score.  Impaired fasting glucose (prediabetes) Last hemoglobin A1c was 6.0 in July. Discussed dietary modifications to manage blood sugar levels. - Recheck labs in December to monitor glucose levels.  General Health Maintenance Emphasized importance of flu vaccination due to COPD. - Administer flu vaccine today.  Follow-Up Plan to follow up for lab work and further evaluation of current conditions. - Follow up in December for lab work and evaluation.             Objective  BP 104/80   Pulse 73   Ht 5' 3 (1.6 m)   Wt 112 lb 9.6 oz (51.1 kg)   SpO2 95%   BMI 19.95 kg/m      Review of Systems  Constitutional:  Negative for chills, fever and weight loss.  Eyes:  Negative for blurred vision. h Respiratory:  Negative for cough and shortness of  breath.   Cardiovascular:  Negative for chest pain and palpitations.  Skin:  Negative for rash.  Psychiatric/Behavioral:  Negative for depression. The patient is not nervous/anxious.      Physical Exam Physical Exam Vitals reviewed.  Constitutional:      Appearance: Normal appearance. Well-developed with normal weight.  HENT:     Head: Normocephalic and atraumatic.  Normal mucous membranes, no oral lesions Eyes:     Pupils: Pupils are equal, round, and reactive to light.  Neck:     Thyroid: No thyroid mass or thyromegaly.  Cardiovascular:     Rate and Rhythm: Normal rate and regular rhythm. Normal heart sounds. Normal peripheral pulses Pulmonary:     Normal breath sounds with normal effort Abdominal:   Abdomen is soft, without tenderness or noted hepatosplenomegaly Musculoskeletal:        General: No swelling or edema  Lymphadenopathy:     Cervical: No cervical adenopathy.  Skin:    General: Skin is warm and dry without noticeable rash. Neurological:     General: No focal deficit present.  Psychiatric:        Mood and Affect: Mood, behavior and cognition normal   Past Medical History:  Diagnosis Date   Acute on chronic  respiratory failure with hypoxia and hypercapnia (HCC) 02/26/2022   Acute pulmonary edema (HCC) 09/30/2021   Acute respiratory failure with hypoxia (HCC) 11/08/2015   AKI (acute kidney injury) 09/30/2021   Anemia    Anxiety    Asthma    CAP (community acquired pneumonia) 09/23/2021   COPD (chronic obstructive pulmonary disease) (HCC)    COPD with acute exacerbation (HCC) 06/18/2021   Depression    Elevated troponin 06/18/2021   Incisional hernia, without obstruction or gangrene 04/22/2020   Iron  deficiency anemia 03/18/2021   Lung nodule 03/18/2021   Mass of small intestine 06/22/2013   Melena 05/28/2013   Moderate persistent asthma without complication 07/19/2022   Nuclear sclerosis of left eye 09/05/2015   Pneumonia due to Streptococcus  09/30/2021   Respiratory distress    S/P repair of paraesophageal hernia 03/18/2023   Severe sepsis (HCC) 03/18/2021   Ventral hernia    Ventral hernia without obstruction or gangrene 12/06/2022   Past Surgical History:  Procedure Laterality Date   COLON SURGERY     EAR TUBE REMOVAL     FRACTURE SURGERY     HERNIA REPAIR     TUBAL LIGATION     Family Status  Relation Name Status   Mother  Alive   Father  Deceased  No partnership data on file   Family History  Problem Relation Age of Onset   Sarcoidosis Mother    Alcohol abuse Father    Social History   Socioeconomic History   Marital status: Married    Spouse name: Not on file   Number of children: Not on file   Years of education: Not on file   Highest education level: Not on file  Occupational History   Not on file  Tobacco Use   Smoking status: Every Day    Current packs/day: 0.50    Types: Cigarettes   Smokeless tobacco: Never  Substance and Sexual Activity   Alcohol use: No   Drug use: No   Sexual activity: Not on file  Other Topics Concern   Not on file  Social History Narrative   Not on file   Social Drivers of Health   Financial Resource Strain: Low Risk  (03/17/2023)   [facility]   Overall Financial Resource Strain (CARDIA)    Difficulty of Paying Living Expenses: Not very hard  Food Insecurity: No Food Insecurity (06/10/2023)   Hunger Vital Sign    Worried About Running Out of Food in the Last Year: Never true    Ran Out of Food in the Last Year: Never true  Transportation Needs: No Transportation Needs (06/10/2023)   PRAPARE - Administrator, Civil Service (Medical): No    Lack of Transportation (Non-Medical): No  Physical Activity: Inactive (03/31/2021)   [facility] from [facility]   Exercise Vital Sign    On average, how many days per week do you engage in moderate to strenuous exercise (like a brisk walk)?: 0 days    On average, how many minutes do you  engage in exercise at this level?: 0 min  Stress: Not on file  Social Connections: Moderately Integrated (06/10/2023)   Social Connection and Isolation Panel    Frequency of Communication with Friends and Family: More than three times a week    Frequency of Social Gatherings with Friends and Family: More than three times a week    Attends Religious Services: More than 4 times per year  Active Member of Clubs or Organizations: No    Attends Banker Meetings: Never    Marital Status: Married   Outpatient Medications Prior to Visit  Medication Sig   acetaminophen  (TYLENOL ) 500 MG tablet Take 1,000 mg by mouth every 6 (six) hours as needed.   albuterol  (PROVENTIL ) (2.5 MG/3ML) 0.083% nebulizer solution Inhale 2.5 mg into the lungs.   albuterol  (VENTOLIN  HFA) 108 (90 Base) MCG/ACT inhaler Inhale 2 puffs into the lungs every 6 (six) hours as needed for wheezing or shortness of breath.   ALPRAZolam  (XANAX ) 0.5 MG tablet Take 1-2 tablets (0.5-1 mg total) by mouth daily as needed.   gabapentin  (NEURONTIN ) 300 MG capsule Take 300 mg by mouth.   magnesium  oxide (MAG-OX) 400 MG tablet Take 400 mg by mouth daily.   omeprazole  (PRILOSEC) 20 MG capsule Take 1 capsule (20 mg total) by mouth daily as needed.   sertraline  (ZOLOFT ) 100 MG tablet Take 150 mg by mouth.   TRELEGY ELLIPTA  100-62.5-25 MCG/ACT AEPB Inhale 1 puff into the lungs daily.   albuterol  (PROVENTIL ) (2.5 MG/3ML) 0.083% nebulizer solution Take 3 mLs (2.5 mg total) by nebulization every 2 (two) hours as needed for wheezing or shortness of breath.   gabapentin  (NEURONTIN ) 300 MG capsule Take 300 mg by mouth 3 (three) times daily.   sertraline  (ZOLOFT ) 100 MG tablet Take 1.5 tablets (150 mg total) by mouth daily.   [DISCONTINUED] docusate sodium  (COLACE) 100 MG capsule Take 100 mg by mouth daily as needed for mild constipation. (Patient not taking: Reported on 02/29/2024)   [DISCONTINUED] pantoprazole  (PROTONIX ) 40 MG tablet Take  40 mg by mouth daily.   [DISCONTINUED] varenicline  (CHANTIX ) 1 MG tablet Take 1 mg by mouth 2 (two) times daily. (Patient not taking: Reported on 02/29/2024)   No facility-administered medications prior to visit.   Allergies  Allergen Reactions   Bee Pollen    Pollen Extract    Banana Nausea And Vomiting    Immunization History  Administered Date(s) Administered   Influenza Inj Mdck Quad Pf 03/25/2020, 03/25/2020, 03/31/2021   Influenza, Seasonal, Injecte, Preservative Fre 02/02/2012, 01/19/2023   Influenza,inj,Quad PF,6+ Mos 01/30/2014, 03/06/2015, 02/02/2016, 06/07/2018, 05/09/2019, 01/08/2022   Influenza,inj,quad, With Preservative 02/14/2013   Influenza-Unspecified 01/30/2014, 03/06/2015, 02/02/2016, 06/07/2018, 05/09/2019   PFIZER(Purple Top)SARS-COV-2 Vaccination 07/28/2019, 08/21/2019, 02/22/2020   PNEUMOCOCCAL CONJUGATE-20 12/17/2020, 01/19/2023   Pfizer Covid-19 Vaccine Bivalent Booster 30yrs & up 04/20/2021   Pfizer(Comirnaty)Fall Seasonal Vaccine 12 years and older 01/19/2023   Pneumococcal Polysaccharide-23 07/23/2011   Tdap 02/02/2012   Zoster Recombinant(Shingrix) 10/25/2022, 01/31/2023    Health Maintenance  Topic Date Due   Hepatitis C Screening  Never done   DTaP/Tdap/Td (2 - Td or Tdap) 02/01/2022   Mammogram  01/09/2023   Influenza Vaccine  12/02/2023   COVID-19 Vaccine (6 - 2025-26 season) 01/02/2024   Cervical Cancer Screening (HPV/Pap Cotest)  03/31/2026   Colonoscopy  02/16/2033   Pneumococcal Vaccine: 50+ Years  Completed   HIV Screening  Completed   Zoster Vaccines- Shingrix  Completed   Hepatitis B Vaccines 19-59 Average Risk  Aged Out   HPV VACCINES  Aged Out   Meningococcal B Vaccine  Aged Out    Patient Care Team: Everlene Parris LABOR, MD as PCP - General (Family Medicine)  Depression Screen    11/22/2023    1:33 PM  PHQ 2/9 Scores  PHQ - 2 Score 2  PHQ- 9 Score 7     Aragon Scarantino LABOR Everlene, MD  [place]  [facility] [phone] (phone) [phone] (fax)  [facility]

## 2024-02-29 NOTE — Addendum Note (Signed)
 Addended by: BLINDA DOUGLAS on: 02/29/2024 10:34 AM   Modules accepted: Orders

## 2024-03-07 NOTE — Telephone Encounter (Unsigned)
 Copied from CRM #[id number]. Topic: General - Other >> Mar 07, 2024  8:23 AM Thersia BROCKS wrote: Reason for CRM: Sari from [facility] called in stated she needs patient most recent office notes for appointment on 10/29 faxed over   FAX NUMBER : [phone]

## 2024-04-02 ENCOUNTER — Ambulatory Visit: Payer: Self-pay

## 2024-04-02 ENCOUNTER — Ambulatory Visit: Admitting: Family Medicine

## 2024-04-02 VITALS — BP 110/64 | HR 66 | Ht 63.0 in | Wt 115.5 lb

## 2024-04-02 DIAGNOSIS — H8110 Benign paroxysmal vertigo, unspecified ear: Secondary | ICD-10-CM

## 2024-04-02 DIAGNOSIS — H9313 Tinnitus, bilateral: Secondary | ICD-10-CM

## 2024-04-02 DIAGNOSIS — R519 Headache, unspecified: Secondary | ICD-10-CM | POA: Diagnosis not present

## 2024-04-02 MED ORDER — MECLIZINE HCL 25 MG PO TABS: 25.0000 mg | ORAL_TABLET | Freq: Three times a day (TID) | ORAL | 0 refills | Status: AC | PRN

## 2024-04-02 NOTE — Telephone Encounter (Signed)
 FYI Only or Action Required?: FYI only for provider: appointment scheduled on 12/1.  Patient was last seen in primary care on 02/29/2024 by Zafirov, Clarissa A, MD.  Called Nurse Triage reporting Fall.  Symptoms began several months ago.  Interventions attempted: OTC medications: tylenol , ibuprofen , BC Goody and Rest, hydration, or home remedies.  Symptoms are: gradually worsening.  Triage Disposition: See Physician Within 24 Hours  Patient/caregiver understands and will follow disposition?: Yes  Copied from CRM #[id number]. Topic: Clinical - Red Word Triage >> Apr 02, 2024  9:41 AM Alfonso HERO wrote: Red Word that prompted transfer to Nurse Triage: patient fell a while back and has had ongoing pain in the head thinks it may be a concussion with ringing in the ears Reason for Disposition  [1] MODERATE dizziness (e.g., interferes with normal activities) AND [2] has NOT been evaluated by doctor (or NP/PA) for this  (Exception: Dizziness caused by heat exposure, sudden standing, or poor fluid intake.)  Answer Assessment - Initial Assessment Questions August 29th- tripped over the dog gate at 1am and hit head on the rounded corner of an end table. Goose egg above right eye with small lac.  Not on blood thinners, put ice on it and stayed awake the rest of the night.  Since then she has developed headaches, dizziness, and worsened tinnitus (chronic compaint for over 80yrs). She can be unsteady at times, and dizzy described as lightheaded mostly when getting up from sitting. Does walk into door frames.  Ibuprofenand tylenol  bring pain from 10/10 to 7/10, caved and took [facility] (2-Rh) Goody powder and pain dropped to 2/10.  Denies unilateral weakness, slurred speech, vision changes, or CP, SOB. Appt with PCP office found for today. Pt advised may still be sent to ED for imaging. She understands.    1. MECHANISM: How did the fall happen?     Tripped over dog gate 2. DOMESTIC VIOLENCE AND ELDER ABUSE SCREENING:  Did you fall because someone pushed you or tried to hurt you? If Yes, ask: Are you safe now?     denies 3. ONSET: When did the fall happen? (e.g., minutes, hours, or days ago)     August 29th at 1am  4. LOCATION: What part of the body hit the ground? (e.g., back, buttocks, head, hips, knees, hands, head, stomach)     Right head hit the rounded  5. INJURY: Did you hurt (injure) yourself when you fell? If Yes, ask: What did you injure? Tell me more about this? (e.g., body area; type of injury; pain severity)     Goose egg above right eye 6. PAIN: Is there any pain? If Yes, ask: How bad is the pain? (e.g., Scale 0-10; or none, mild,      Headaches 10/10 7. SIZE: For cuts, bruises, or swelling, ask: How large is it? (e.g., inches or centimeters)      Almost gone now 9. OTHER SYMPTOMS: Do you have any other symptoms? (e.g., dizziness, fever, weakness; new-onset or worsening).      Dizziness, headaches 10. CAUSE: What do you think caused the fall (or falling)? (e.g., dizzy spell, tripped)       Tripped on dog gate  Answer Assessment - Initial Assessment Questions 1. DESCRIPTION: Describe your dizziness.     Light headed 2. LIGHTHEADED: Do you feel lightheaded? (e.g., somewhat faint, woozy, weak upon standing)     Drunkedness sensation  3. VERTIGO: Do you feel like either you or the room is spinning or tilting? (i.e., vertigo)  Denies spinning  4. SEVERITY: How bad is it?  Do you feel like you are going to faint? Can you stand and walk?     Affects walking- staggers into door frames  5. ONSET:  When did the dizziness begin?     Since the fall in August 6. AGGRAVATING FACTORS: Does anything make it worse? (e.g., standing, change in head position)     Moving to quickly, standing to quickly 7. HEART RATE: Can you tell me your heart rate? How many beats in 15 seconds?  (Note: Not all patients can do this.)       Feels slightly racing when she does, BP  120/80 8. CAUSE: What do you think is causing the dizziness? (e.g., decreased fluids or food, diarrhea, emotional distress, heat exposure, new medicine, sudden standing, vomiting; unknown)     Possible concussion 9. RECURRENT SYMPTOM: Have you had dizziness before? If Yes, ask: When was the last time? What happened that time?     Since fall in August 10. OTHER SYMPTOMS: Do you have any other symptoms? (e.g., fever, chest pain, vomiting, diarrhea, bleeding)       headaches  Protocols used: Falls and Falling-A-AH, Dizziness - Lightheadedness-A-AH

## 2024-04-02 NOTE — Patient Instructions (Addendum)
 Thank you for coming to the office today.  Referral sent to ENT for consultation - for headaches, inner ear balance vs Vertigo / dizziness and tinnitus ringing.  [facility] [address] [place], [place] [address] Phone: [phone]  -------------------------   1. You have symptoms of Vertigo (Benign Paroxysmal Positional Vertigo) - This is commonly caused by inner ear fluid imbalance, sometimes can be worsened by allergies and sinus symptoms, otherwise it can occur randomly sometimes and we may never discover the exact cause. - To treat this, try the Epley Manuever (see diagrams/instructions below) at home up to 3 times a day for 1-2 weeks or until symptoms resolve - You may take Meclizine as needed up to 3 times a day for dizziness, this will not cure symptoms but may help. Caution may make you drowsy.  If you develop significant worsening episode with vertigo that does not improve and you get severe headache, loss of vision, arm or leg weakness, slurred speech, or other concerning symptoms please seek immediate medical attention at Emergency Department.  See the next page for images describing the Epley Manuever.     ----------------------------------------------------------------------------------------------------------------------         Please schedule a Follow-up Appointment to: Return if symptoms worsen or fail to improve.  If you have any other questions or concerns, please feel free to call the office or send a message through MyChart. You may also schedule an earlier appointment if necessary.  Additionally, you may be receiving a survey about your experience at our office within a few days to 1 week by e-mail or mail. We value your feedback.  Marsa Officer, DO [facility], [place]

## 2024-04-02 NOTE — Progress Notes (Signed)
 Subjective:    Patient ID: Julia [name], female    DOB: 1960/10/12, 63 y.o.   MRN: [id number]  Julia [name] is a 63 y.o. female presenting on 04/02/2024 for Headache (Couple of weeks, hit her head couple months ago)  Patient presents for a same day appointment.  PCP Dr Everlene  HPI  Discussed the use of AI scribe software for clinical note transcription with the patient, who gave verbal consent to proceed.  History of Present Illness   Julia [name] is a 63 year old female who presents with headaches, dizziness, and tinnitus following a fall in August 2020.  Cephalalgia (headache) - Severe headaches for the past 2-3 weeks - Pain primarily located over the vertex of the head - Described as feeling like the head is 'going to pop off' - Tylenol  and ibuprofen  provide no significant relief - Blood pressure measured at 120/80 mmHg on one occasion, but not during a severe headache episode  Vertigo / Dizziness - Dizziness described as feeling 'like being lightly intoxicated' - Occurs in waves lasting a few seconds - Triggered by standing up or turning the head quickly - Causes staggering but no falls - No prior diagnosis of vertigo  Tinnitus - Constant ringing in the head for over 30 years - Ringing has recently become louder, especially on the morning of the visit - Increased tinnitus is distressing - No prior evaluation by an ENT specialist  Head trauma - Tripped over a doggie gate in August 2020, striking head on a shelf - No loss of consciousness - Experienced visual 'stars' for a few seconds after impact - Did not seek medical attention at the time - Did not mention the incident during a medical visit in October 2020         04/02/2024   11:24 AM 11/22/2023    1:33 PM  Depression screen PHQ 2/9  Decreased Interest 1 1  Down, Depressed, Hopeless 1 1  PHQ - 2 Score 2 2  Altered sleeping 2 1  Tired, decreased energy 2 1  Change in appetite 0 1  Feeling bad  or failure about yourself  0 1  Trouble concentrating 1 1  Moving slowly or fidgety/restless 0 0  Suicidal thoughts 0 0  PHQ-9 Score 7 7   Difficult doing work/chores Somewhat difficult Not difficult at all     Data saved with a previous flowsheet row definition       04/02/2024   11:25 AM 11/22/2023    1:34 PM  GAD 7 : Generalized Anxiety Score  Nervous, Anxious, on Edge 1 0  Control/stop worrying 1 1  Worry too much - different things 1 1  Trouble relaxing 1 1  Restless 0 0  Easily annoyed or irritable 1 1  Afraid - awful might happen 1 1  Total GAD 7 Score 6 5  Anxiety Difficulty Somewhat difficult Not difficult at all    Social History   Tobacco Use   Smoking status: Every Day    Current packs/day: 0.50    Types: Cigarettes   Smokeless tobacco: Never  Substance Use Topics   Alcohol use: No   Drug use: No    Review of Systems Per HPI unless specifically indicated above     Objective:    BP 110/64 (BP Location: Left Arm, Patient Position: Sitting, Cuff Size: Normal)   Pulse 66   Ht 5' 3 (1.6 m)   Wt 115 lb 8 oz (52.4 kg)  SpO2 97%   BMI 20.46 kg/m   Wt Readings from Last 3 Encounters:  04/02/24 115 lb 8 oz (52.4 kg)  02/29/24 112 lb 9.6 oz (51.1 kg)  11/22/23 109 lb 3.2 oz (49.5 kg)    Physical Exam Vitals and nursing note reviewed.  Constitutional:      General: She is not in acute distress.    Appearance: Normal appearance. She is well-developed. She is not diaphoretic.     Comments: Well-appearing, comfortable, cooperative  HENT:     Head: Normocephalic.     Comments: Mild R forehead area with evidence of previous injury but no obvious ecchymosis or deformity, no hematoma    Right Ear: Ear canal and external ear normal. There is no impacted cerumen.     Left Ear: Ear canal and external ear normal. There is no impacted cerumen.     Ears:     Comments: TM bilateral with scar tissue on them prior perforations or TM abnormality has since long  healed. No effusion or erythema.    Nose: No congestion.  Eyes:     General:        Right eye: No discharge.        Left eye: No discharge.     Conjunctiva/sclera: Conjunctivae normal.  Cardiovascular:     Rate and Rhythm: Normal rate.  Pulmonary:     Effort: Pulmonary effort is normal.  Skin:    General: Skin is warm and dry.     Findings: No erythema or rash.  Neurological:     General: No focal deficit present.     Mental Status: She is alert and oriented to person, place, and time. Mental status is at baseline.     Cranial Nerves: No cranial nerve deficit.     Sensory: No sensory deficit.     Motor: No weakness.  Psychiatric:        Mood and Affect: Mood normal.        Behavior: Behavior normal.        Thought Content: Thought content normal.     Comments: Well groomed, good eye contact, normal speech and thoughts     Results for orders placed or performed in visit on 11/22/23  CBC w/Diff   Collection Time: 11/22/23  2:08 PM  Result Value Ref Range   WBC 7.4 3.4 - 10.8 x10E3/uL   RBC 4.82 3.77 - 5.28 x10E6/uL   Hemoglobin 13.5 11.1 - 15.9 g/dL   Hematocrit 57.0 65.9 - 46.6 %   MCV 89 79 - 97 fL   MCH 28.0 26.6 - 33.0 pg   MCHC 31.5 31.5 - 35.7 g/dL   RDW 85.9 88.2 - 84.5 %   Platelets 170 150 - 450 x10E3/uL   Neutrophils 65 Not Estab. %   Lymphs 25 Not Estab. %   Monocytes 6 Not Estab. %   Eos 3 Not Estab. %   Basos 1 Not Estab. %   Neutrophils Absolute 4.8 1.4 - 7.0 x10E3/uL   Lymphocytes Absolute 1.8 0.7 - 3.1 x10E3/uL   Monocytes Absolute 0.4 0.1 - 0.9 x10E3/uL   EOS (ABSOLUTE) 0.2 0.0 - 0.4 x10E3/uL   Basophils Absolute 0.1 0.0 - 0.2 x10E3/uL   Immature Granulocytes 0 Not Estab. %   Immature Grans (Abs) 0.0 0.0 - 0.1 x10E3/uL  Comp Met (CMET)   Collection Time: 11/22/23  2:08 PM  Result Value Ref Range   Glucose 77 70 - 99 mg/dL   BUN 14 8 -  27 mg/dL   Creatinine, Ser 9.30 0.57 - 1.00 mg/dL   eGFR 97 >40 fO/fpw/8.26   BUN/Creatinine Ratio 20 12 - 28    Sodium 140 134 - 144 mmol/L   Potassium 4.8 3.5 - 5.2 mmol/L   Chloride 98 96 - 106 mmol/L   CO2 23 20 - 29 mmol/L   Calcium 10.3 8.7 - 10.3 mg/dL   Total Protein 7.1 6.0 - 8.5 g/dL   Albumin 4.5 3.9 - 4.9 g/dL   Globulin, Total 2.6 1.5 - 4.5 g/dL   Bilirubin Total 0.2 0.0 - 1.2 mg/dL   Alkaline Phosphatase 66 44 - 121 IU/L   AST 23 0 - 40 IU/L   ALT 14 0 - 32 IU/L  HgB A1c   Collection Time: 11/22/23  2:08 PM  Result Value Ref Range   Hgb A1c MFr Bld 6.0 (H) 4.8 - 5.6 %   Est. average glucose Bld gHb Est-mCnc 126 mg/dL  Iron , TIBC and Ferritin Panel   Collection Time: 11/22/23  2:08 PM  Result Value Ref Range   Total Iron  Binding Capacity 338 250 - 450 ug/dL   UIBC 729 881 - 630 ug/dL   Iron  68 27 - 139 ug/dL   Iron  Saturation 20 15 - 55 %   Ferritin 70 15 - 150 ng/mL  Fecal occult blood, imunochemical   Collection Time: 11/23/23  2:59 PM   Specimen: Stool   ST  Result Value Ref Range   Fecal Occult Bld Negative Negative      Assessment & Plan:   Problem List Items Addressed This Visit   None Visit Diagnoses       Benign paroxysmal positional vertigo, unspecified laterality    -  Primary   Relevant Medications   meclizine (ANTIVERT) 25 MG tablet   Other Relevant Orders   Ambulatory referral to ENT     Tinnitus of both ears       Relevant Orders   Ambulatory referral to ENT     Frontal headache       Relevant Medications   meloxicam (MOBIC) 15 MG tablet   methocarbamol (ROBAXIN) 500 MG tablet   Other Relevant Orders   Ambulatory referral to ENT       Fall Injury 3 months ago History not suggestive of concussion or other direct injury from the fall. She had a contusion based on the fall impact on R forehead area but that has improved. She still has residual soreness. But her current symptoms below seem mostly inner ear related, and frontal headache can be multifactorial. This is not an acute new sudden headache from trauma or injury.  Benign paroxysmal  vertigo Intermittent dizziness and imbalance with quick head movements or standing up, suggestive of vertigo possibly related to inner ear issues. - Prescribed meclizine as needed. - Provided inner ear reset exercises. Epley Manuever - Referred to ENT for further evaluation.  Tinnitus Chronic ringing in the ears for over 30 years, recently louder, possibly related to inner ear issues. - Referred to ENT for evaluation.  Headache Not directly related to the fall injury based on timing and history. Frontal headaches for 2-3 weeks, no significant blood pressure changes, stable vital signs. - Monitor blood pressure during headaches. - Referred to ENT for further evaluation.        Orders Placed This Encounter  Procedures   Ambulatory referral to ENT    Referral Priority:   Routine    Referral Type:   Consultation  Referral Reason:   Specialty Services Required    Requested Specialty:   Otolaryngology    Number of Visits Requested:   1    Meds ordered this encounter  Medications   meclizine (ANTIVERT) 25 MG tablet    Sig: Take 1 tablet (25 mg total) by mouth 3 (three) times daily as needed.    Dispense:  30 tablet    Refill:  0    Follow up plan: Return if symptoms worsen or fail to improve.   Marsa Officer, DO [facility] 04/02/2024, 11:32 AM

## 2024-04-16 ENCOUNTER — Other Ambulatory Visit

## 2024-04-16 DIAGNOSIS — J449 Chronic obstructive pulmonary disease, unspecified: Secondary | ICD-10-CM

## 2024-04-16 DIAGNOSIS — F1320 Sedative, hypnotic or anxiolytic dependence, uncomplicated: Secondary | ICD-10-CM

## 2024-04-16 DIAGNOSIS — K219 Gastro-esophageal reflux disease without esophagitis: Secondary | ICD-10-CM

## 2024-04-16 DIAGNOSIS — I2510 Atherosclerotic heart disease of native coronary artery without angina pectoris: Secondary | ICD-10-CM

## 2024-04-16 DIAGNOSIS — F17209 Nicotine dependence, unspecified, with unspecified nicotine-induced disorders: Secondary | ICD-10-CM

## 2024-04-16 DIAGNOSIS — F5101 Primary insomnia: Secondary | ICD-10-CM

## 2024-04-16 DIAGNOSIS — D214 Benign neoplasm of connective and other soft tissue of abdomen: Secondary | ICD-10-CM

## 2024-04-16 DIAGNOSIS — F411 Generalized anxiety disorder: Secondary | ICD-10-CM

## 2024-04-17 LAB — COMPREHENSIVE METABOLIC PANEL WITH GFR
AG Ratio: 1.4 (calc) (ref 1.0–2.5)
ALT: 12 U/L (ref 6–29)
AST: 19 U/L (ref 10–35)
Albumin: 4.5 g/dL (ref 3.6–5.1)
Alkaline phosphatase (APISO): 64 U/L (ref 37–153)
BUN: 13 mg/dL (ref 7–25)
CO2: 31 mmol/L (ref 20–32)
Calcium: 10.4 mg/dL (ref 8.6–10.4)
Chloride: 99 mmol/L (ref 98–110)
Creat: 0.76 mg/dL (ref 0.50–1.05)
Globulin: 3.2 g/dL (ref 1.9–3.7)
Glucose, Bld: 97 mg/dL (ref 65–99)
Potassium: 4.3 mmol/L (ref 3.5–5.3)
Sodium: 139 mmol/L (ref 135–146)
Total Bilirubin: 0.3 mg/dL (ref 0.2–1.2)
Total Protein: 7.7 g/dL (ref 6.1–8.1)
eGFR: 87 mL/min/1.73m2 (ref >=60)

## 2024-04-17 LAB — URINALYSIS, ROUTINE W REFLEX MICROSCOPIC
Bilirubin Urine: NEGATIVE
Glucose, UA: NEGATIVE
Hgb urine dipstick: NEGATIVE
Ketones, ur: NEGATIVE
Leukocytes,Ua: NEGATIVE
Nitrite: NEGATIVE
Protein, ur: NEGATIVE
Specific Gravity, Urine: 1.01 (ref 1.001–1.035)
pH: 7 (ref 5.0–8.0)

## 2024-04-17 LAB — CBC WITH DIFFERENTIAL/PLATELET
Absolute Lymphocytes: 1828 {cells}/uL (ref 850–3900)
Absolute Monocytes: 596 {cells}/uL (ref 200–950)
Basophils Absolute: 61 {cells}/uL (ref 0–200)
Basophils Relative: 0.6 %
Eosinophils Absolute: 747 {cells}/uL — ABNORMAL HIGH (ref 15–500)
Eosinophils Relative: 7.4 %
HCT: 40.9 % (ref 35.9–46.0)
Hemoglobin: 13.1 g/dL (ref 11.7–15.5)
MCH: 28.1 pg (ref 27.0–33.0)
MCHC: 32 g/dL (ref 31.6–35.4)
MCV: 87.6 fL (ref 81.4–101.7)
MPV: 11.4 fL (ref 7.5–12.5)
Monocytes Relative: 5.9 %
Neutro Abs: 6868 {cells}/uL (ref 1500–7800)
Neutrophils Relative %: 68 %
Platelets: 206 Thousand/uL (ref 140–400)
RBC: 4.67 Million/uL (ref 3.80–5.10)
RDW: 13.2 % (ref 11.0–15.0)
Total Lymphocyte: 18.1 %
WBC: 10.1 Thousand/uL (ref 3.8–10.8)

## 2024-04-17 LAB — LIPID PANEL
Cholesterol: 236 mg/dL — ABNORMAL HIGH (ref <200)
HDL: 59 mg/dL (ref >=50)
LDL Cholesterol (Calc): 158 mg/dL — ABNORMAL HIGH
Non-HDL Cholesterol (Calc): 177 mg/dL — ABNORMAL HIGH (ref <130)
Total CHOL/HDL Ratio: 4 (calc) (ref <5.0)
Triglycerides: 83 mg/dL (ref <150)

## 2024-04-17 LAB — HEMOGLOBIN A1C
Hgb A1c MFr Bld: 6.2 % — ABNORMAL HIGH (ref <5.7)
Mean Plasma Glucose: 131 mg/dL
eAG (mmol/L): 7.3 mmol/L

## 2024-04-30 ENCOUNTER — Ambulatory Visit

## 2024-04-30 ENCOUNTER — Ambulatory Visit
Admission: RE | Admit: 2024-04-30 | Discharge: 2024-04-30 | Disposition: A | Discharge status: Home / Self Care | Source: Ambulatory Visit

## 2024-04-30 ENCOUNTER — Ambulatory Visit: Admission: RE | Admit: 2024-04-30 | Discharge: 2024-04-30 | Disposition: A | Discharge status: Home / Self Care

## 2024-04-30 VITALS — BP 95/60 | HR 69 | Ht 63.0 in | Wt 113.8 lb

## 2024-04-30 DIAGNOSIS — J441 Chronic obstructive pulmonary disease with (acute) exacerbation: Secondary | ICD-10-CM

## 2024-04-30 DIAGNOSIS — F1320 Sedative, hypnotic or anxiolytic dependence, uncomplicated: Secondary | ICD-10-CM

## 2024-04-30 MED ORDER — PREDNISONE 20 MG PO TABS: 40.0000 mg | ORAL_TABLET | Freq: Every day | ORAL | 0 refills | Status: AC

## 2024-04-30 MED ORDER — LEVOFLOXACIN 500 MG PO TABS: 500.0000 mg | ORAL_TABLET | Freq: Every day | ORAL | 0 refills | Status: AC

## 2024-04-30 NOTE — Progress Notes (Addendum)
 "           Progress Note  Physician: Indea Dearman A Naquan Garman, MD   HPI: Julia [name] is a 63 y.o. female presenting on 04/30/2024 for Follow-up (Patient is concerned with a cough she noticed it started 4 days ago.) .  Discussed the use of AI scribe software for clinical note transcription with the patient, who gave verbal consent to proceed.  History of Present Illness   Julia [name] is a 63 year old female with [name] who presents with worsening respiratory symptoms and back pain.  Respiratory symptoms - Cough, fever, and increased phlegm for the past four days - Increased use of inhalers and respirator, approximately every couple of hours - Three to four breathing treatments per day - Uses CPAP machine at night and when reclining - Currently on Trelegy for [name] management - History of hospitalizations for [name] in the past  Back and chest pain - Severe back pain radiating to the chest, particularly on the left side - Pain worsens with coughing and breathing - No pain with sitting, only with coughing and breathing  Blood pressure and anxiolytic use - Blood pressure lower than usual during the visit - Using Xanax , believes it may contribute to low blood pressure - Breaking Xanax  into smaller doses to manage anxiety  Tobacco use - Continues to smoke, but has reduced amount recently  Glycemic status - Blood sugar levels have been borderline diabetic - 6.2      Recent Results (from the past 2160 hours)  Urinalysis, Routine w reflex microscopic     Status: None   Collection Time: 04/16/24  9:13 AM  Result Value Ref Range   Color, Urine YELLOW YELLOW   APPearance CLEAR CLEAR   Specific Gravity, Urine 1.010 1.001 - 1.035   pH 7.0 5.0 - 8.0   Glucose, UA NEGATIVE NEGATIVE   Bilirubin Urine NEGATIVE NEGATIVE   Ketones, ur NEGATIVE NEGATIVE   Hgb urine dipstick NEGATIVE NEGATIVE   Protein, ur NEGATIVE NEGATIVE   Nitrite NEGATIVE NEGATIVE   Leukocytes,Ua NEGATIVE NEGATIVE   Lipid panel     Status: Abnormal   Collection Time: 04/16/24  9:13 AM  Result Value Ref Range   Cholesterol 236 (H) <200 mg/dL   HDL 59 > OR = 50 mg/dL   Triglycerides 83 <849 mg/dL   LDL Cholesterol (Calc) 158 (H) mg/dL (calc)    Comment: Reference range: <100 . Desirable range <100 mg/dL for primary prevention;   <70 mg/dL for patients with CHD or diabetic patients  with > or = 2 CHD risk factors. SABRA LDL-C is now calculated using the Martin-Hopkins  calculation, which is a validated novel method providing  better accuracy than the Friedewald equation in the  estimation of LDL-C.  Julia [name] et al. Julia [name]. 7986;689(80): 2061-2068  (http://education.[facility].com/faq/FAQ164)    Total CHOL/HDL Ratio 4.0 <5.0 (calc)   Non-HDL Cholesterol (Calc) 177 (H) <130 mg/dL (calc)    Comment: For patients with diabetes plus 1 major ASCVD risk  factor, treating to a non-HDL-C goal of <100 mg/dL  (LDL-C of <29 mg/dL) is considered a therapeutic  option.   Hemoglobin A1c     Status: Abnormal   Collection Time: 04/16/24  9:13 AM  Result Value Ref Range   Hgb A1c MFr Bld 6.2 (H) <5.7 %    Comment: For someone without known diabetes, a hemoglobin  A1c value between 5.7% and 6.4% is consistent with prediabetes and should be confirmed with a  follow-up test. . For someone with known diabetes, a value <7% indicates that their diabetes is well controlled. A1c targets should be individualized based on duration of diabetes, age, comorbid conditions, and other considerations. . This assay result is consistent with an increased risk of diabetes. . Currently, no consensus exists regarding use of hemoglobin A1c for diagnosis of diabetes for children. .    Mean Plasma Glucose 131 mg/dL   eAG (mmol/L) 7.3 mmol/L  Comprehensive metabolic panel with GFR     Status: None   Collection Time: 04/16/24  9:13 AM  Result Value Ref Range   Glucose, Bld 97 65 - 99 mg/dL    Comment: .             Fasting reference interval .    BUN 13 7 - 25 mg/dL   Creat 9.23 9.49 - 8.94 mg/dL   eGFR 87 > OR = 60 fO/fpw/8.26f7   BUN/Creatinine Ratio SEE NOTE: 6 - 22 (calc)    Comment:    Not Reported: BUN and Creatinine are within    reference range. .    Sodium 139 135 - 146 mmol/L   Potassium 4.3 3.5 - 5.3 mmol/L   Chloride 99 98 - 110 mmol/L   CO2 31 20 - 32 mmol/L   Calcium 10.4 8.6 - 10.4 mg/dL   Total Protein 7.7 6.1 - 8.1 g/dL   Albumin 4.5 3.6 - 5.1 g/dL   Globulin 3.2 1.9 - 3.7 g/dL (calc)   AG Ratio 1.4 1.0 - 2.5 (calc)   Total Bilirubin 0.3 0.2 - 1.2 mg/dL   Alkaline phosphatase (APISO) 64 37 - 153 U/L   AST 19 10 - 35 U/L   ALT 12 6 - 29 U/L  CBC with Differential/Platelet     Status: Abnormal   Collection Time: 04/16/24  9:13 AM  Result Value Ref Range   WBC 10.1 3.8 - 10.8 Thousand/uL   RBC 4.67 3.80 - 5.10 Million/uL   Hemoglobin 13.1 11.7 - 15.5 g/dL   HCT 59.0 64.0 - 53.9 %   MCV 87.6 81.4 - 101.7 fL   MCH 28.1 27.0 - 33.0 pg   MCHC 32.0 31.6 - 35.4 g/dL   RDW 86.7 88.9 - 84.9 %   Platelets 206 140 - 400 Thousand/uL   MPV 11.4 7.5 - 12.5 fL   Neutro Abs 6,868 1,500 - 7,800 cells/uL   Absolute Lymphocytes 1,828 850 - 3,900 cells/uL   Absolute Monocytes 596 200 - 950 cells/uL   Eosinophils Absolute 747 (H) 15 - 500 cells/uL   Basophils Absolute 61 0 - 200 cells/uL   Neutrophils Relative % 68 %   Total Lymphocyte 18.1 %   Monocytes Relative 5.9 %   Eosinophils Relative 7.4 %   Basophils Relative 0.6 %         Medical history:  Relevant past medical, surgical, family and social history reviewed and updated as indicated. Interim medical history since our last visit reviewed.  Allergies and medications reviewed and updated.   ROS: Negative unless specifically indicated above in HPI.   Current Medications[1]       Objective:     BP 95/60 (BP Location: Right Arm, Patient Position: Sitting, Cuff Size: Normal)   Pulse 69   Ht 5' 3 (1.6 m)   Wt 113  lb 12.8 oz (51.6 kg)   SpO2 96%   BMI 20.16 kg/m   Wt Readings from Last 3 Encounters:  04/30/24 113 lb 12.8 oz (51.6  kg)  04/02/24 115 lb 8 oz (52.4 kg)  02/29/24 112 lb 9.6 oz (51.1 kg)    Physical Exam  Physical Exam Vitals reviewed.  Constitutional:      Appearance: Normal appearance. Well-developed with normal weight.  Cardiovascular:     Rate and Rhythm: Normal rate and regular rhythm. Normal heart sounds. Normal peripheral pulses Pulmonary:     Bilateral rhonchi and few rales left.  No retractions, no tachypnea, SaO2 97 Skin:    General: Skin is warm and dry without noticeable rash. Neurological:     General: No focal deficit present.  Psychiatric:        Mood and Affect: Mood, behavior and cognition normal      Assessment & Plan:   Encounter Diagnoses  Name Primary?   Chronic obstructive pulmonary disease with acute exacerbation (HCC) Yes    Orders Placed This Encounter  Procedures   DG Chest 2 View     Assessment and Plan    Chronic obstructive pulmonary disease with acute exacerbation Acute exacerbation with increased cough, phlegm, and back pain. Oxygen  saturation at 97%.  Emphasized early intervention to prevent hospitalization. - Prescribed prednisone , levofloxacin .  Note she had completed amoxicillin course recently after dental appt.  - CXR today with follow-up appointment on Friday. - Advised to monitor breathing with pulse oximeter and seek immediate care if symptoms worsen. Query pneumonia with cough, fever, and localized pain in the left lower lung area.   - No indications to consider cardiac cause at this time as chest pain seems pleuritic in nature  Prediabetes Blood sugar levels slightly elevated. Discussed lifestyle modifications to manage blood sugar.  We will address this at additional visits  Hypotension Blood pressure at 95/60. Discussed Xanax  impact on blood pressure and advised caution. - Advised to monitor blood pressure closely. -  Recommended limiting Xanax  use to once a day or avoiding it if possible.   We will monitor very closely for worsening symptoms with caution and possible hospitalization if any changes.  Appt fu Friday.                 [1]  Current Outpatient Medications:    acetaminophen  (TYLENOL ) 500 MG tablet, Take 1,000 mg by mouth every 6 (six) hours as needed., Disp: , Rfl:    albuterol  (PROVENTIL ) (2.5 MG/3ML) 0.083% nebulizer solution, Take 3 mLs (2.5 mg total) by nebulization every 2 (two) hours as needed for wheezing or shortness of breath., Disp: 90 mL, Rfl: 0   ALPRAZolam  (XANAX ) 0.5 MG tablet, Take 0.5 tablets (0.25 mg total) by mouth 2 (two) times daily as needed., Disp: 60 tablet, Rfl: 2   amoxicillin (AMOXIL) 500 MG capsule, Take 500 mg by mouth 3 (three) times daily., Disp: , Rfl:    gabapentin  (NEURONTIN ) 300 MG capsule, Take 1 capsule (300 mg total) by mouth daily., Disp: 30 capsule, Rfl: 1   levofloxacin  (LEVAQUIN ) 500 MG tablet, Take 1 tablet (500 mg total) by mouth daily for 7 days., Disp: 7 tablet, Rfl: 0   magnesium  oxide (MAG-OX) 400 MG tablet, Take 400 mg by mouth daily., Disp: , Rfl:    meclizine  (ANTIVERT ) 25 MG tablet, Take 1 tablet (25 mg total) by mouth 3 (three) times daily as needed., Disp: 30 tablet, Rfl: 0   meloxicam (MOBIC) 15 MG tablet, Take 15 mg by mouth daily., Disp: , Rfl:    methocarbamol (ROBAXIN) 500 MG tablet, Take 500 mg by mouth 3 (three) times daily., Disp: , Rfl:  omeprazole  (PRILOSEC) 20 MG capsule, Take 1 capsule (20 mg total) by mouth daily as needed., Disp: 180 capsule, Rfl: 3   predniSONE  (DELTASONE ) 20 MG tablet, Take 2 tablets (40 mg total) by mouth daily for 5 days., Disp: 10 tablet, Rfl: 0   sertraline  (ZOLOFT ) 100 MG tablet, Take 150 mg by mouth., Disp: , Rfl:    TRELEGY ELLIPTA  100-62.5-25 MCG/ACT AEPB, Inhale 1 puff into the lungs daily., Disp: 60 each, Rfl: 2  "

## 2024-05-04 ENCOUNTER — Telehealth: Payer: Self-pay

## 2024-05-04 ENCOUNTER — Ambulatory Visit

## 2024-05-07 NOTE — Telephone Encounter (Signed)
 This encounter was created in error - please disregard.

## 2024-05-10 ENCOUNTER — Ambulatory Visit: Payer: Self-pay

## 2024-05-25 ENCOUNTER — Other Ambulatory Visit: Payer: Self-pay | Admitting: Otolaryngology

## 2024-05-25 DIAGNOSIS — R42 Dizziness and giddiness: Secondary | ICD-10-CM

## 2024-06-01 ENCOUNTER — Encounter: Payer: Self-pay | Admitting: Otolaryngology

## 2024-06-04 ENCOUNTER — Other Ambulatory Visit

## 2024-06-13 ENCOUNTER — Ambulatory Visit

## 2024-06-15 ENCOUNTER — Other Ambulatory Visit
